# Patient Record
Sex: Female | Born: 1945 | Race: White | Hispanic: No | Marital: Married | Smoking: Former smoker
Health system: Southern US, Community
[De-identification: ages and names within clinical notes are randomized; demographics above are authoritative.]

## PROBLEM LIST (undated history)

## (undated) DIAGNOSIS — I2510 Atherosclerotic heart disease of native coronary artery without angina pectoris: Secondary | ICD-10-CM

## (undated) DIAGNOSIS — Z8701 Personal history of pneumonia (recurrent): Secondary | ICD-10-CM

## (undated) DIAGNOSIS — N393 Stress incontinence (female) (male): Secondary | ICD-10-CM

## (undated) DIAGNOSIS — C801 Malignant (primary) neoplasm, unspecified: Secondary | ICD-10-CM

## (undated) DIAGNOSIS — K08109 Complete loss of teeth, unspecified cause, unspecified class: Secondary | ICD-10-CM

## (undated) DIAGNOSIS — E785 Hyperlipidemia, unspecified: Secondary | ICD-10-CM

## (undated) DIAGNOSIS — Z8709 Personal history of other diseases of the respiratory system: Secondary | ICD-10-CM

## (undated) DIAGNOSIS — Z923 Personal history of irradiation: Secondary | ICD-10-CM

## (undated) DIAGNOSIS — E039 Hypothyroidism, unspecified: Secondary | ICD-10-CM

## (undated) DIAGNOSIS — F329 Major depressive disorder, single episode, unspecified: Secondary | ICD-10-CM

## (undated) DIAGNOSIS — Z972 Presence of dental prosthetic device (complete) (partial): Secondary | ICD-10-CM

## (undated) DIAGNOSIS — E119 Type 2 diabetes mellitus without complications: Secondary | ICD-10-CM

## (undated) DIAGNOSIS — J449 Chronic obstructive pulmonary disease, unspecified: Secondary | ICD-10-CM

## (undated) DIAGNOSIS — C50919 Malignant neoplasm of unspecified site of unspecified female breast: Secondary | ICD-10-CM

## (undated) DIAGNOSIS — I10 Essential (primary) hypertension: Secondary | ICD-10-CM

## (undated) DIAGNOSIS — I219 Acute myocardial infarction, unspecified: Secondary | ICD-10-CM

## (undated) DIAGNOSIS — C55 Malignant neoplasm of uterus, part unspecified: Secondary | ICD-10-CM

## (undated) DIAGNOSIS — M1990 Unspecified osteoarthritis, unspecified site: Secondary | ICD-10-CM

## (undated) DIAGNOSIS — F32A Depression, unspecified: Secondary | ICD-10-CM

## (undated) DIAGNOSIS — M810 Age-related osteoporosis without current pathological fracture: Secondary | ICD-10-CM

## (undated) HISTORY — PX: CARDIAC CATHETERIZATION: SHX172

## (undated) HISTORY — DX: Hypothyroidism, unspecified: E03.9

## (undated) HISTORY — PX: MULTIPLE TOOTH EXTRACTIONS: SHX2053

## (undated) HISTORY — DX: Essential (primary) hypertension: I10

## (undated) HISTORY — PX: TONSILLECTOMY: SUR1361

## (undated) HISTORY — PX: COLONOSCOPY W/ POLYPECTOMY: SHX1380

## (undated) HISTORY — PX: ABDOMINAL HYSTERECTOMY: SHX81

## (undated) HISTORY — PX: OTHER SURGICAL HISTORY: SHX169

## (undated) HISTORY — DX: Age-related osteoporosis without current pathological fracture: M81.0

## (undated) HISTORY — DX: Atherosclerotic heart disease of native coronary artery without angina pectoris: I25.10

## (undated) HISTORY — DX: Type 2 diabetes mellitus without complications: E11.9

## (undated) HISTORY — PX: BLADDER SURGERY: SHX569

## (undated) HISTORY — PX: CORONARY ARTERY BYPASS GRAFT: SHX141

## (undated) HISTORY — DX: Hyperlipidemia, unspecified: E78.5

## (undated) HISTORY — PX: APPENDECTOMY: SHX54

## (undated) HISTORY — PX: TMJ ARTHROPLASTY: SHX1066

---

## 1994-02-14 DIAGNOSIS — I219 Acute myocardial infarction, unspecified: Secondary | ICD-10-CM | POA: Insufficient documentation

## 1994-02-14 HISTORY — DX: Acute myocardial infarction, unspecified: I21.9

## 2010-05-19 ENCOUNTER — Ambulatory Visit: Payer: Self-pay | Admitting: Cardiovascular Disease

## 2010-08-20 ENCOUNTER — Encounter: Payer: Self-pay | Admitting: Cardiovascular Disease

## 2010-10-17 ENCOUNTER — Other Ambulatory Visit (HOSPITAL_COMMUNITY): Payer: Self-pay | Admitting: Internal Medicine

## 2010-10-17 DIAGNOSIS — Z1231 Encounter for screening mammogram for malignant neoplasm of breast: Secondary | ICD-10-CM

## 2010-10-28 ENCOUNTER — Ambulatory Visit (HOSPITAL_COMMUNITY)
Admission: RE | Admit: 2010-10-28 | Discharge: 2010-10-28 | Disposition: A | Discharge status: Home / Self Care | Payer: Medicare Other | Source: Ambulatory Visit | Attending: Internal Medicine | Admitting: Internal Medicine

## 2010-10-28 DIAGNOSIS — Z1231 Encounter for screening mammogram for malignant neoplasm of breast: Secondary | ICD-10-CM

## 2010-10-28 DIAGNOSIS — Z13820 Encounter for screening for osteoporosis: Secondary | ICD-10-CM | POA: Insufficient documentation

## 2010-10-28 DIAGNOSIS — Z780 Asymptomatic menopausal state: Secondary | ICD-10-CM | POA: Insufficient documentation

## 2010-11-04 ENCOUNTER — Other Ambulatory Visit: Payer: Self-pay | Admitting: Internal Medicine

## 2010-11-04 DIAGNOSIS — R928 Other abnormal and inconclusive findings on diagnostic imaging of breast: Secondary | ICD-10-CM

## 2010-11-12 ENCOUNTER — Ambulatory Visit
Admission: RE | Admit: 2010-11-12 | Discharge: 2010-11-12 | Disposition: A | Discharge status: Home / Self Care | Payer: Medicare Other | Source: Ambulatory Visit | Attending: Internal Medicine | Admitting: Internal Medicine

## 2010-11-12 DIAGNOSIS — R928 Other abnormal and inconclusive findings on diagnostic imaging of breast: Secondary | ICD-10-CM

## 2010-12-04 ENCOUNTER — Encounter: Payer: Self-pay | Admitting: Cardiovascular Disease

## 2010-12-04 DIAGNOSIS — I2510 Atherosclerotic heart disease of native coronary artery without angina pectoris: Secondary | ICD-10-CM | POA: Insufficient documentation

## 2010-12-04 DIAGNOSIS — E785 Hyperlipidemia, unspecified: Secondary | ICD-10-CM | POA: Insufficient documentation

## 2010-12-04 DIAGNOSIS — I10 Essential (primary) hypertension: Secondary | ICD-10-CM | POA: Insufficient documentation

## 2010-12-08 ENCOUNTER — Encounter: Payer: Self-pay | Admitting: Cardiovascular Disease

## 2010-12-08 ENCOUNTER — Ambulatory Visit (INDEPENDENT_AMBULATORY_CARE_PROVIDER_SITE_OTHER): Payer: Medicare Other | Admitting: Cardiovascular Disease

## 2010-12-08 VITALS — BP 136/78 | HR 64 | Ht 62.0 in | Wt 169.4 lb

## 2010-12-08 DIAGNOSIS — I2510 Atherosclerotic heart disease of native coronary artery without angina pectoris: Secondary | ICD-10-CM

## 2010-12-08 DIAGNOSIS — E785 Hyperlipidemia, unspecified: Secondary | ICD-10-CM

## 2010-12-08 NOTE — Assessment & Plan Note (Signed)
Her lipid levels from Dr. Su Hilt office very Acceptable. She is to continue with her same medications.

## 2010-12-08 NOTE — Assessment & Plan Note (Signed)
Denise Mckenzie is doing well from a cardiac standpoint. She's not had any episodes of chest pain. I'm pleased that she's been able to do all of her normal activities. We will continue with her same medications.

## 2010-12-08 NOTE — Progress Notes (Signed)
Denise Mckenzie Old Date of Birth  11-27-1945 [facility] / [facility] [address]     [address] [place], [place]  [address] [phone]  Fax  [phone]  History of Present Illness:  Denise Mckenzie is a middle-age female the history of coronary artery disease and coronary artery bypass grafting. She also has a history of hypertension, hyperlipidemia, and hypothyroidism. She has not had any episodes of chest pain or shortness of breath.  Current Outpatient Prescriptions on File Prior to Visit  Medication Sig Dispense Refill  . amitriptyline (ELAVIL) 25 MG tablet Take 25 mg by mouth at bedtime.        Marland Kitchen aspirin 325 MG tablet Take 325 mg by mouth daily.        Marland Kitchen atorvastatin (LIPITOR) 40 MG tablet Take 40 mg by mouth daily.        . calcitonin, salmon, (MIACALCIN/FORTICAL) 200 UNIT/ACT nasal spray 1 spray by Nasal route daily.        Marland Kitchen CALCIUM PO Take 1,800 mg by mouth daily.        Marland Kitchen HYDROcodone-acetaminophen (VICODIN) 5-500 MG per tablet Take 1 tablet by mouth every 6 (six) hours as needed.        Marland Kitchen levothyroxine (SYNTHROID, LEVOTHROID) 88 MCG tablet Take 88 mcg by mouth daily.        . metoprolol (LOPRESSOR) 50 MG tablet Take 25 mg by mouth 2 (two) times daily.        . nitroGLYCERIN (NITROSTAT) 0.4 MG SL tablet Place 0.4 mg under the tongue every 5 (five) minutes as needed.        . valsartan-hydrochlorothiazide (DIOVAN-HCT) 80-12.5 MG per tablet Take 1 tablet by mouth daily.          No Known Allergies  Past Medical History  Diagnosis Date  . Coronary artery disease   . Hyperlipidemia   . Hypertension     Past Surgical History  Procedure Date  . Coronary artery bypass graft     History  Smoking status  . Former Smoker  . Quit date: 08/17/1994  Smokeless tobacco  . Not on file    History  Alcohol Use No    Family History  Problem Relation Age of Onset  . Alzheimer's disease Mother   . Heart attack Father     Reviw of Systems:  Reviewed in  the HPI.  All other systems are negative.  Physical Exam: BP 136/78  Pulse 64  Ht 5\' 2"  (1.575 m)  Wt 169 lb 6.4 oz (76.839 kg)  BMI 30.98 kg/m2 The patient is alert and oriented x 3.  The mood and affect are normal.  The skin is warm and dry.  Color is normal.  The HEENT exam reveals that the sclera are nonicteric.  The mucous membranes are moist.  The carotids are 2+ without bruits.  There is no thyromegaly.  There is no JVD.  The lungs are clear.  The chest wall is non tender.  The heart exam reveals a regular rate with a normal S1 and S2.  There are no murmurs, gallops, or rubs.  The PMI is not displaced.   Abdominal exam reveals good bowel sounds.  There is no guarding or rebound.  There is no hepatosplenomegaly or tenderness.  There are no masses.  Exam of the legs reveal no clubbing, cyanosis, or edema.  The legs are without rashes.  The distal pulses are intact.  Cranial nerves II - XII are intact.  Motor and sensory functions are intact.  The gait is normal.  Laboratory data from Dr. Su Hilt office reveals a hemoglobin of 16.0. Her sodium is 139. Potassium is 4.5. BUN is 8. Creatinine 0.82. Her liver function tests are normal. Her TSH was 1.123. Her total cholesterol is 149. The triglyceride level is 155. The HDL is 38. Her LDL is 80. Her hemoglobin A1c is 6.3. Assessment / Plan:

## 2011-06-16 ENCOUNTER — Encounter: Payer: Self-pay | Admitting: Cardiovascular Disease

## 2011-06-16 ENCOUNTER — Ambulatory Visit (INDEPENDENT_AMBULATORY_CARE_PROVIDER_SITE_OTHER): Payer: Medicare Other | Admitting: Cardiovascular Disease

## 2011-06-16 VITALS — BP 134/79 | HR 70 | Ht 62.0 in | Wt 174.0 lb

## 2011-06-16 DIAGNOSIS — I10 Essential (primary) hypertension: Secondary | ICD-10-CM

## 2011-06-16 DIAGNOSIS — I2510 Atherosclerotic heart disease of native coronary artery without angina pectoris: Secondary | ICD-10-CM

## 2011-06-16 DIAGNOSIS — E785 Hyperlipidemia, unspecified: Secondary | ICD-10-CM

## 2011-06-16 NOTE — Progress Notes (Signed)
Denise Mckenzie Date of Birth  1945/11/27 [facility] [address]    [address] [place], [place]  [address] [phone]  Fax  [phone]  History of Present Illness:  65 YO with a Hx of CAD- S/P CABG.  Hx of hypothyroidism, HTN and hyperlipidemia.  She has been under lots of stress with her husbands illness. She denies any chest pain or dyspnea.    Current Outpatient Prescriptions on File Prior to Visit  Medication Sig Dispense Refill  . aspirin 325 MG tablet Take 325 mg by mouth daily.        Marland Kitchen atorvastatin (LIPITOR) 40 MG tablet Take 40 mg by mouth daily.        . calcitonin, salmon, (MIACALCIN/FORTICAL) 200 UNIT/ACT nasal spray 1 spray by Nasal route daily.        Marland Kitchen CALCIUM PO Take 1,800 mg by mouth daily.        . fish oil-omega-3 fatty acids 1000 MG capsule Take by mouth daily.        Marland Kitchen HYDROcodone-acetaminophen (VICODIN) 5-500 MG per tablet Take 1 tablet by mouth every 6 (six) hours as needed.        Marland Kitchen levothyroxine (SYNTHROID, LEVOTHROID) 88 MCG tablet Take 88 mcg by mouth daily.        Marland Kitchen losartan-hydrochlorothiazide (HYZAAR) 100-12.5 MG per tablet Take 1 tablet by mouth daily.        . metoprolol (LOPRESSOR) 50 MG tablet Take 25 mg by mouth 2 (two) times daily.        . nitroGLYCERIN (NITROSTAT) 0.4 MG SL tablet Place 0.4 mg under the tongue every 5 (five) minutes as needed.          No Known Allergies  Past Medical History  Diagnosis Date  . Coronary artery disease   . Hyperlipidemia   . Hypertension     Past Surgical History  Procedure Date  . Coronary artery bypass graft     History  Smoking status  . Former Smoker  . Quit date: 08/17/1994  Smokeless tobacco  . Not on file    History  Alcohol Use No    Family History  Problem Relation Age of Onset  . Alzheimer's disease Mother   . Heart attack Father     Reviw of Systems:  Reviewed in the HPI.  All other systems are negative.  Physical Exam: BP 134/79  Pulse 70  Ht 5\' 2"  (1.575 m)   Wt 174 lb (78.926 kg)  BMI 31.83 kg/m2 The patient is alert and oriented x 3.  The mood and affect are normal.   Skin: warm and dry.  Color is normal.    HEENT:   2+ right carotid, 1+ left carotid  Lungs: clear   Heart: RR, normal heart sounds    Abdomen: soft, good BS  Extremities:  No edema  Neuro:  Non focal     ECG: NSR, Old septal MI.   Assessment / Plan:

## 2011-06-16 NOTE — Assessment & Plan Note (Signed)
Denise Mckenzie seems to be doing fairly well. She's not having episodes of chest pain. We'll continue with her same medications.

## 2011-06-16 NOTE — Assessment & Plan Note (Signed)
Her blood pressure remains well controlled.  

## 2011-06-16 NOTE — Assessment & Plan Note (Signed)
She has her lab work checked at Dr. Su Hilt office. We'll have her bring a copy of her most recent labs.

## 2011-06-16 NOTE — Patient Instructions (Signed)
Your physician wants you to follow-up in: 6 months  You will receive a reminder letter in the mail two months in advance. If you don't receive a letter, please call our office to schedule the follow-up appointment.  Your physician recommends that you continue on your current medications as directed. Please refer to the Current Medication list given to you today.  

## 2011-08-21 ENCOUNTER — Encounter: Payer: Self-pay | Admitting: Cardiovascular Disease

## 2011-11-17 ENCOUNTER — Other Ambulatory Visit (HOSPITAL_COMMUNITY): Payer: Self-pay | Admitting: Internal Medicine

## 2011-11-17 DIAGNOSIS — Z1231 Encounter for screening mammogram for malignant neoplasm of breast: Secondary | ICD-10-CM

## 2011-12-11 ENCOUNTER — Ambulatory Visit (HOSPITAL_COMMUNITY)
Admission: RE | Admit: 2011-12-11 | Discharge: 2011-12-11 | Disposition: A | Discharge status: Home / Self Care | Payer: Medicare Other | Source: Ambulatory Visit | Attending: Internal Medicine | Admitting: Internal Medicine

## 2011-12-11 DIAGNOSIS — Z1231 Encounter for screening mammogram for malignant neoplasm of breast: Secondary | ICD-10-CM | POA: Insufficient documentation

## 2012-08-04 ENCOUNTER — Other Ambulatory Visit: Payer: Self-pay | Admitting: *Deleted

## 2012-12-01 ENCOUNTER — Other Ambulatory Visit (HOSPITAL_COMMUNITY): Payer: Self-pay | Admitting: Internal Medicine

## 2012-12-01 DIAGNOSIS — Z1231 Encounter for screening mammogram for malignant neoplasm of breast: Secondary | ICD-10-CM

## 2012-12-12 ENCOUNTER — Ambulatory Visit (HOSPITAL_COMMUNITY): Payer: Medicare Other

## 2012-12-15 ENCOUNTER — Ambulatory Visit (HOSPITAL_COMMUNITY)
Admission: RE | Admit: 2012-12-15 | Discharge: 2012-12-15 | Disposition: A | Discharge status: Home / Self Care | Payer: Medicare Other | Source: Ambulatory Visit | Attending: Internal Medicine | Admitting: Internal Medicine

## 2012-12-15 DIAGNOSIS — Z1231 Encounter for screening mammogram for malignant neoplasm of breast: Secondary | ICD-10-CM

## 2012-12-19 ENCOUNTER — Other Ambulatory Visit: Payer: Self-pay | Admitting: Internal Medicine

## 2012-12-19 DIAGNOSIS — R928 Other abnormal and inconclusive findings on diagnostic imaging of breast: Secondary | ICD-10-CM

## 2012-12-29 ENCOUNTER — Ambulatory Visit
Admission: RE | Admit: 2012-12-29 | Discharge: 2012-12-29 | Disposition: A | Discharge status: Home / Self Care | Payer: Medicare Other | Source: Ambulatory Visit | Attending: Internal Medicine | Admitting: Internal Medicine

## 2012-12-29 DIAGNOSIS — R928 Other abnormal and inconclusive findings on diagnostic imaging of breast: Secondary | ICD-10-CM

## 2013-06-13 ENCOUNTER — Other Ambulatory Visit: Payer: Self-pay | Admitting: Internal Medicine

## 2013-06-13 DIAGNOSIS — N6320 Unspecified lump in the left breast, unspecified quadrant: Secondary | ICD-10-CM

## 2013-07-03 ENCOUNTER — Ambulatory Visit
Admission: RE | Admit: 2013-07-03 | Discharge: 2013-07-03 | Disposition: A | Discharge status: Home / Self Care | Payer: Medicare Other | Source: Ambulatory Visit | Attending: Internal Medicine | Admitting: Internal Medicine

## 2013-07-03 DIAGNOSIS — N6320 Unspecified lump in the left breast, unspecified quadrant: Secondary | ICD-10-CM

## 2013-11-13 ENCOUNTER — Encounter: Payer: Self-pay | Admitting: Cardiovascular Disease

## 2013-12-12 ENCOUNTER — Other Ambulatory Visit: Payer: Self-pay | Admitting: Internal Medicine

## 2013-12-12 DIAGNOSIS — N649 Disorder of breast, unspecified: Secondary | ICD-10-CM

## 2014-01-01 ENCOUNTER — Ambulatory Visit
Admission: RE | Admit: 2014-01-01 | Discharge: 2014-01-01 | Disposition: A | Discharge status: Home / Self Care | Payer: Medicare Other | Source: Ambulatory Visit | Attending: Internal Medicine | Admitting: Internal Medicine

## 2014-01-01 ENCOUNTER — Encounter (INDEPENDENT_AMBULATORY_CARE_PROVIDER_SITE_OTHER): Payer: Self-pay

## 2014-01-01 DIAGNOSIS — N649 Disorder of breast, unspecified: Secondary | ICD-10-CM

## 2014-08-22 ENCOUNTER — Other Ambulatory Visit (HOSPITAL_COMMUNITY): Payer: Self-pay | Admitting: Internal Medicine

## 2014-08-22 DIAGNOSIS — Z0000 Encounter for general adult medical examination without abnormal findings: Secondary | ICD-10-CM | POA: Diagnosis not present

## 2014-08-22 DIAGNOSIS — R002 Palpitations: Secondary | ICD-10-CM | POA: Diagnosis not present

## 2014-08-22 DIAGNOSIS — Z6833 Body mass index (BMI) 33.0-33.9, adult: Secondary | ICD-10-CM | POA: Diagnosis not present

## 2014-08-22 DIAGNOSIS — R1032 Left lower quadrant pain: Secondary | ICD-10-CM | POA: Diagnosis not present

## 2014-08-22 DIAGNOSIS — R42 Dizziness and giddiness: Secondary | ICD-10-CM | POA: Diagnosis not present

## 2014-08-22 DIAGNOSIS — Z1389 Encounter for screening for other disorder: Secondary | ICD-10-CM | POA: Diagnosis not present

## 2014-08-22 DIAGNOSIS — Z131 Encounter for screening for diabetes mellitus: Secondary | ICD-10-CM | POA: Diagnosis not present

## 2014-08-22 DIAGNOSIS — E039 Hypothyroidism, unspecified: Secondary | ICD-10-CM | POA: Diagnosis not present

## 2014-08-22 DIAGNOSIS — I70219 Atherosclerosis of native arteries of extremities with intermittent claudication, unspecified extremity: Secondary | ICD-10-CM | POA: Diagnosis not present

## 2014-08-22 DIAGNOSIS — R5383 Other fatigue: Secondary | ICD-10-CM | POA: Diagnosis not present

## 2014-08-22 DIAGNOSIS — E780 Pure hypercholesterolemia: Secondary | ICD-10-CM | POA: Diagnosis not present

## 2014-08-22 DIAGNOSIS — Z1211 Encounter for screening for malignant neoplasm of colon: Secondary | ICD-10-CM | POA: Diagnosis not present

## 2014-08-22 DIAGNOSIS — M810 Age-related osteoporosis without current pathological fracture: Secondary | ICD-10-CM

## 2014-08-22 DIAGNOSIS — H8309 Labyrinthitis, unspecified ear: Secondary | ICD-10-CM | POA: Diagnosis not present

## 2014-08-22 DIAGNOSIS — Z79899 Other long term (current) drug therapy: Secondary | ICD-10-CM | POA: Diagnosis not present

## 2014-08-22 DIAGNOSIS — H9113 Presbycusis, bilateral: Secondary | ICD-10-CM | POA: Diagnosis not present

## 2014-08-22 DIAGNOSIS — J440 Chronic obstructive pulmonary disease with acute lower respiratory infection: Secondary | ICD-10-CM | POA: Diagnosis not present

## 2014-08-22 DIAGNOSIS — Z23 Encounter for immunization: Secondary | ICD-10-CM | POA: Diagnosis not present

## 2014-08-30 ENCOUNTER — Ambulatory Visit (HOSPITAL_COMMUNITY)
Admission: RE | Admit: 2014-08-30 | Discharge: 2014-08-30 | Disposition: A | Discharge status: Home / Self Care | Payer: Medicare Other | Source: Ambulatory Visit | Attending: Internal Medicine | Admitting: Internal Medicine

## 2014-08-30 DIAGNOSIS — Z13820 Encounter for screening for osteoporosis: Secondary | ICD-10-CM | POA: Diagnosis not present

## 2014-08-30 DIAGNOSIS — M810 Age-related osteoporosis without current pathological fracture: Secondary | ICD-10-CM | POA: Diagnosis not present

## 2014-08-30 DIAGNOSIS — Z780 Asymptomatic menopausal state: Secondary | ICD-10-CM | POA: Insufficient documentation

## 2014-12-25 ENCOUNTER — Other Ambulatory Visit (HOSPITAL_COMMUNITY): Payer: Self-pay | Admitting: Internal Medicine

## 2014-12-25 DIAGNOSIS — Z1231 Encounter for screening mammogram for malignant neoplasm of breast: Secondary | ICD-10-CM

## 2015-01-01 ENCOUNTER — Ambulatory Visit (HOSPITAL_COMMUNITY)
Admission: RE | Admit: 2015-01-01 | Discharge: 2015-01-01 | Disposition: A | Discharge status: Home / Self Care | Payer: Medicare Other | Source: Ambulatory Visit | Attending: Internal Medicine | Admitting: Internal Medicine

## 2015-01-01 ENCOUNTER — Other Ambulatory Visit (HOSPITAL_COMMUNITY): Payer: Self-pay | Admitting: Internal Medicine

## 2015-01-01 DIAGNOSIS — Z1231 Encounter for screening mammogram for malignant neoplasm of breast: Secondary | ICD-10-CM

## 2015-01-07 ENCOUNTER — Ambulatory Visit (HOSPITAL_COMMUNITY)
Admission: RE | Admit: 2015-01-07 | Discharge: 2015-01-07 | Disposition: A | Discharge status: Home / Self Care | Payer: Medicare Other | Source: Ambulatory Visit | Attending: Internal Medicine | Admitting: Internal Medicine

## 2015-01-07 DIAGNOSIS — Z1231 Encounter for screening mammogram for malignant neoplasm of breast: Secondary | ICD-10-CM | POA: Diagnosis present

## 2015-09-02 ENCOUNTER — Ambulatory Visit
Admission: RE | Admit: 2015-09-02 | Discharge: 2015-09-02 | Disposition: A | Discharge status: Home / Self Care | Payer: Medicare Other | Source: Ambulatory Visit | Attending: Internal Medicine | Admitting: Internal Medicine

## 2015-09-02 ENCOUNTER — Other Ambulatory Visit: Payer: Self-pay | Admitting: Internal Medicine

## 2015-09-02 DIAGNOSIS — R0602 Shortness of breath: Secondary | ICD-10-CM

## 2015-09-04 ENCOUNTER — Other Ambulatory Visit: Payer: Self-pay | Admitting: Internal Medicine

## 2015-09-04 DIAGNOSIS — R9389 Abnormal findings on diagnostic imaging of other specified body structures: Secondary | ICD-10-CM

## 2015-09-11 ENCOUNTER — Ambulatory Visit
Admission: RE | Admit: 2015-09-11 | Discharge: 2015-09-11 | Disposition: A | Discharge status: Home / Self Care | Payer: Medicare Other | Source: Ambulatory Visit | Attending: Internal Medicine | Admitting: Internal Medicine

## 2015-09-11 DIAGNOSIS — R9389 Abnormal findings on diagnostic imaging of other specified body structures: Secondary | ICD-10-CM

## 2015-09-18 ENCOUNTER — Telehealth: Payer: Self-pay | Admitting: *Deleted

## 2015-09-18 NOTE — Telephone Encounter (Signed)
Oncology Nurse Navigator Documentation  Oncology Nurse Navigator Flowsheets 09/18/2015  Navigator Encounter Type Telephone  Telephone Outgoing Call/I received a referral on [name] today.  I called her and left a vm message to call with appt.  I left my name and phone number to call.   Abnormal Finding Date 09/11/2015  Treatment Phase Abnormal Scans  Barriers/Navigation Needs Coordination of Care  Interventions Coordination of Care  Acuity Level 1  Time Spent with Patient 15

## 2015-09-19 ENCOUNTER — Telehealth: Payer: Self-pay | Admitting: *Deleted

## 2015-09-19 DIAGNOSIS — R911 Solitary pulmonary nodule: Secondary | ICD-10-CM | POA: Insufficient documentation

## 2015-09-19 NOTE — Telephone Encounter (Signed)
Oncology Nurse Navigator Documentation  Oncology Nurse Navigator Flowsheets 09/19/2015  Navigator Encounter Type Telephone  Telephone Outgoing Call/Ms. Dost called and left me a message to call.  I called her back and gave her an appt to see Dr. Julien Nordmann on 10/01/15 arrive at 1:45.  Patient verbalized understanding of appt time and place. She stated she was nervous about the findings.  I listened as she explained.  I stated she will need to have more test to figure out what is going on.  She was thankful for the call.   Treatment Phase Abnormal Scans  Barriers/Navigation Needs Coordination of Care  Interventions Coordination of Care  Coordination of Care Appts  Acuity Level 2

## 2015-09-19 NOTE — Telephone Encounter (Signed)
Return call.  "I have questions for Navigator that just called."  Call transferred to [phone].

## 2015-10-01 ENCOUNTER — Encounter: Payer: Self-pay | Admitting: Internal Medicine

## 2015-10-01 ENCOUNTER — Other Ambulatory Visit (HOSPITAL_BASED_OUTPATIENT_CLINIC_OR_DEPARTMENT_OTHER): Payer: Medicare Other

## 2015-10-01 ENCOUNTER — Telehealth: Payer: Self-pay | Admitting: Internal Medicine

## 2015-10-01 ENCOUNTER — Ambulatory Visit (HOSPITAL_BASED_OUTPATIENT_CLINIC_OR_DEPARTMENT_OTHER): Payer: Medicare Other | Admitting: Internal Medicine

## 2015-10-01 VITALS — BP 137/68 | HR 87 | Temp 98.3°F | Resp 19 | Ht 62.0 in | Wt 171.5 lb

## 2015-10-01 DIAGNOSIS — Z87891 Personal history of nicotine dependence: Secondary | ICD-10-CM

## 2015-10-01 DIAGNOSIS — R911 Solitary pulmonary nodule: Secondary | ICD-10-CM | POA: Diagnosis not present

## 2015-10-01 LAB — CBC WITH DIFFERENTIAL/PLATELET
BASO%: 1.2 % (ref 0.0–2.0)
Basophils Absolute: 0.1 10*3/uL (ref 0.0–0.1)
EOS%: 2.2 % (ref 0.0–7.0)
Eosinophils Absolute: 0.2 10*3/uL (ref 0.0–0.5)
HCT: 45.5 % (ref 34.8–46.6)
HGB: 15.2 g/dL (ref 11.6–15.9)
LYMPH#: 1.9 10*3/uL (ref 0.9–3.3)
LYMPH%: 18.7 % (ref 14.0–49.7)
MCH: 30 pg (ref 25.1–34.0)
MCHC: 33.4 g/dL (ref 31.5–36.0)
MCV: 89.8 fL (ref 79.5–101.0)
MONO#: 0.7 10*3/uL (ref 0.1–0.9)
MONO%: 7.1 % (ref 0.0–14.0)
NEUT%: 70.8 % (ref 38.4–76.8)
NEUTROS ABS: 7.2 10*3/uL — AB (ref 1.5–6.5)
Platelets: 212 10*3/uL (ref 145–400)
RBC: 5.06 10*6/uL (ref 3.70–5.45)
RDW: 14 % (ref 11.2–14.5)
WBC: 10.2 10*3/uL (ref 3.9–10.3)

## 2015-10-01 LAB — COMPREHENSIVE METABOLIC PANEL
ALT: 18 U/L (ref 0–55)
AST: 14 U/L (ref 5–34)
Albumin: 3.7 g/dL (ref 3.5–5.0)
Alkaline Phosphatase: 106 U/L (ref 40–150)
Anion Gap: 11 mEq/L (ref 3–11)
BILIRUBIN TOTAL: 0.44 mg/dL (ref 0.20–1.20)
BUN: 11.5 mg/dL (ref 7.0–26.0)
CHLORIDE: 95 meq/L — AB (ref 98–109)
CO2: 30 mEq/L — ABNORMAL HIGH (ref 22–29)
CREATININE: 0.8 mg/dL (ref 0.6–1.1)
Calcium: 9.6 mg/dL (ref 8.4–10.4)
EGFR: 71 mL/min/{1.73_m2} — ABNORMAL LOW (ref >90)
GLUCOSE: 115 mg/dL (ref 70–140)
Potassium: 3.6 mEq/L (ref 3.5–5.1)
Sodium: 135 mEq/L — ABNORMAL LOW (ref 136–145)
TOTAL PROTEIN: 7.4 g/dL (ref 6.4–8.3)

## 2015-10-01 NOTE — Progress Notes (Signed)
[place] [phone]   [phone]  CONSULT NOTE  REFERRING PHYSICIAN: Dr. Lorene Dy  REASON FOR CONSULTATION:  70 years old white female with suspicious right lung nodule.  HPI Denise Mckenzie is a 70 y.o. female with past medical history significant for coronary artery disease status post CABG by Dr. Servando Snare in 1996, dyslipidemia, hypertension, osteoporosis and recently diagnosed diabetes mellitus. The patient also has history of smoking but quit in 1996. She was seen by her primary care physician Dr. Mancel Bale in January 2017 for routine annual exam and she was complaining of mild cough and shortness breath. Chest x-ray was performed on 09/02/2015 and it showed 1.3 cm rounded nodular density projecting over the right lung apex. This was followed by CT scan of the chest without contrast on 09/11/2015 and it showed a 1.1 cm nodule in the right upper lobe corresponding to the density seen on the chest x-ray concerning for malignancy. There were other small scattered nodules within the lungs that are too small to characterize. There is also some lingular scarring. Dr. Mancel Bale kindly referred the patient to me today for evaluation and recommendation regarding these abnormality in her lung. When seen today the patient is feeling fine with no specific complaints. She denied having any significant weight loss or night sweats. She has no chest pain, shortness breath, cough or hemoptysis. She denied having any nausea, vomiting, diarrhea or constipation. No headache or visual changes. Family history significant for mother died from [place] and father had heart disease. The patient is married and has a daughter and son. She used to work as Glass blower/designer and currently retired. She has a history of smoking 0.5 pack per day for 21 years and quit in 1996. She has no history of alcohol or drug abuse.  HPI  Past Medical History  Diagnosis Date  . Coronary artery  disease   . Hyperlipidemia   . Hypertension     Past Surgical History  Procedure Laterality Date  . Coronary artery bypass graft      Family History  Problem Relation Age of Onset  . Alzheimer's disease Mother   . Heart attack Father     Social History Social History  Substance Use Topics  . Smoking status: Former Smoker    Quit date: 08/17/1994  . Smokeless tobacco: Not on file  . Alcohol Use: No    No Known Allergies  Current Outpatient Prescriptions  Medication Sig Dispense Refill  . amitriptyline (ELAVIL) 50 MG tablet Take 50 mg by mouth at bedtime.      Marland Kitchen aspirin 325 MG tablet Take 325 mg by mouth daily.      Marland Kitchen atorvastatin (LIPITOR) 40 MG tablet Take 40 mg by mouth daily.      Marland Kitchen CALCIUM PO Take 1,800 mg by mouth daily.      . fish oil-omega-3 fatty acids 1000 MG capsule Take by mouth daily.      Marland Kitchen ibandronate (BONIVA) 150 MG tablet Take 1 tablet by mouth every 30 (thirty) days.  4  . levothyroxine (SYNTHROID, LEVOTHROID) 88 MCG tablet Take 88 mcg by mouth daily.      Marland Kitchen losartan-hydrochlorothiazide (HYZAAR) 100-12.5 MG per tablet Take 1 tablet by mouth daily.      . metFORMIN (GLUCOPHAGE) 500 MG tablet Take 500 mg by mouth daily.  4  . metoprolol (LOPRESSOR) 50 MG tablet Take 25 mg by mouth 2 (two) times daily.      . nitroGLYCERIN (  NITROSTAT) 0.4 MG SL tablet Place 0.4 mg under the tongue every 5 (five) minutes as needed.       No current facility-administered medications for this visit.    Review of Systems  Constitutional: negative Eyes: negative Ears, nose, mouth, throat, and face: negative Respiratory: negative Cardiovascular: negative Gastrointestinal: negative Genitourinary:negative Integument/breast: negative Hematologic/lymphatic: negative Musculoskeletal:negative Neurological: negative Behavioral/Psych: negative Endocrine: negative Allergic/Immunologic: negative  Physical Exam  VVO:HYWVP, healthy, no distress, well nourished and well  developed SKIN: skin color, texture, turgor are normal, no rashes or significant lesions HEAD: Normocephalic, No masses, lesions, tenderness or abnormalities EYES: normal, PERRLA, Conjunctiva are pink and non-injected EARS: External ears normal, Canals clear OROPHARYNX:no exudate, no erythema and lips, buccal mucosa, and tongue normal  NECK: supple, no adenopathy, no JVD LYMPH:  no palpable lymphadenopathy, no hepatosplenomegaly BREAST:not examined LUNGS: clear to auscultation , and palpation HEART: regular rate & rhythm and no murmurs ABDOMEN:abdomen soft, non-tender, normal bowel sounds and no masses or organomegaly BACK: Back symmetric, no curvature., No CVA tenderness EXTREMITIES:no joint deformities, effusion, or inflammation, no edema, no skin discoloration  NEURO: alert & oriented x 3 with fluent speech, no focal motor/sensory deficits  PERFORMANCE STATUS: ECOG 1  LABORATORY DATA: Lab Results  Component Value Date   WBC 10.2 10/01/2015   HGB 15.2 10/01/2015   HCT 45.5 10/01/2015   MCV 89.8 10/01/2015   PLT 212 10/01/2015      Chemistry      Component Value Date/Time   NA 135* 10/01/2015 1335   K 3.6 10/01/2015 1335   CO2 30* 10/01/2015 1335   BUN 11.5 10/01/2015 1335   CREATININE 0.8 10/01/2015 1335      Component Value Date/Time   CALCIUM 9.6 10/01/2015 1335   ALKPHOS 106 10/01/2015 1335   AST 14 10/01/2015 1335   ALT 18 10/01/2015 1335   BILITOT 0.44 10/01/2015 1335       RADIOGRAPHIC STUDIES: Dg Chest 2 View  09/02/2015  CLINICAL DATA:  Cough. Shortness of breath. Prior heart surgery 1996. EXAM: CHEST  2 VIEW COMPARISON:  None FINDINGS: Vague density in the lingula on the frontal projection. Possible rounded density projecting over the right first rib and partially over the clavicle measuring 1.4 cm. Prior CABG.  Atherosclerotic aortic arch. No pleural effusion.  Thoracic kyphosis noted. IMPRESSION: 1. 1.3 cm rounded nodular density projecting over the right  lung apex. There is an indistinct density projecting over the lingula. Chest CT is recommended to exclude malignancy. 2. Atherosclerotic aortic arch. 3. Prior CABG. These results will be called to the ordering clinician or representative by the Radiologist Assistant, and communication documented in the PACS or zVision Dashboard. Electronically Signed   By: Van Clines M.D.   On: 09/02/2015 14:19   Ct Chest Wo Contrast  09/11/2015  CLINICAL DATA:  Right upper lobe density on chest x-ray. Remote history of smoking. EXAM: CT CHEST WITHOUT CONTRAST TECHNIQUE: Multidetector CT imaging of the chest was performed following the standard protocol without IV contrast. COMPARISON:  Chest x-ray 09/02/2015 FINDINGS: 11 mm nodule in the right upper lobe corresponding to the density seen on chest x-ray. This is on image 14. Scarring noted in the lingula corresponds to the lingular density on chest x-ray. 5 mm nodule in the right upper lobe on image 21. Other small scattered nodules in the right upper lobe, right lower lobe and left upper lobe. These are too small to characterize. Mild emphysematous changes in the lungs. Heart is normal size. Prior  CABG. Aorta is normal caliber with scattered calcifications. No mediastinal, hilar, or axillary adenopathy. Chest wall soft tissues are unremarkable. Imaging into the upper abdomen shows no acute findings. No acute bony abnormality or focal bone lesion. IMPRESSION: 11 mm nodule in the right upper lobe corresponding to the density seen on chest x-ray. Cannot exclude malignancy. Consider further evaluation with PET CT. Other small scattered nodules within the lungs, stick too small to characterize. Recommend attention on followup imaging. Lingular scarring. Electronically Signed   By: Rolm Baptise M.D.   On: 09/11/2015 10:42    ASSESSMENT: This is a very pleasant 70 years old white female who was recently found to have a suspicious nodule in the right upper lobe concerning for  malignancy.   PLAN: I had a lengthy discussion with the patient today about her current condition. I showed her the images of the CT scan of the chest and the location of the pulmonary nodule. I recommended for the patient to have a PET scan performed for further evaluation of these nodules. If the nodule is hypermetabolic on the PET scan, I referred the patient to Dr. Servando Snare for consideration of surgical resection if she has good pulmonary function. If she is not a good surgical candidate, we will consider biopsy of the left upper lobe pulmonary nodule followed by stereotactic radiotherapy. The patient agreed to the current plan. She would come back for follow-up visit at the multidisciplinary thoracic oncology clinic after her PET scan. The patient was advised to call immediately if she has any concerning symptoms in the interval. The patient voices understanding of current disease status and treatment options and is in agreement with the current care plan.  All questions were answered. The patient knows to call the clinic with any problems, questions or concerns. We can certainly see the patient much sooner if necessary.  Thank you so much for allowing me to participate in the care of Denise Mckenzie. I will continue to follow up the patient with you and assist in her care.  I spent 40 minutes counseling the patient face to face. The total time spent in the appointment was 60 minutes.  Disclaimer: This note was dictated with voice recognition software. Similar sounding words can inadvertently be transcribed and may not be corrected upon review.   Vihana Kydd K. October 01, 2015, 2:51 PM

## 2015-10-01 NOTE — Telephone Encounter (Signed)
per pof to H[place] Dyer to sch [facility] and willc all pt

## 2015-10-07 ENCOUNTER — Telehealth: Payer: Self-pay | Admitting: *Deleted

## 2015-10-07 NOTE — Telephone Encounter (Signed)
Oncology Nurse Navigator Documentation  Oncology Nurse Navigator Flowsheets 10/07/2015  Navigator Encounter Type Telephone/patient called and was confused about when to schedule PET.  I clarified.   Telephone Incoming Call  Abnormal Finding Date -  Treatment Phase Abnormal Scans  Barriers/Navigation Needs Coordination of Care  Interventions Coordination of Care  Coordination of Care Radiology  Acuity Level 1  Time Spent with Patient 15

## 2015-10-07 NOTE — Telephone Encounter (Signed)
"  I need to talk with Denise Mckenzie about [name] availability for my scans.  Schedulers have called twice and have no clue about [name] needed availability for my scans being scheduled."  Call transferred to navigator est. [phone].

## 2015-10-11 ENCOUNTER — Encounter: Payer: Self-pay | Admitting: *Deleted

## 2015-10-11 ENCOUNTER — Telehealth: Payer: Self-pay | Admitting: *Deleted

## 2015-10-11 ENCOUNTER — Ambulatory Visit (HOSPITAL_COMMUNITY)
Admission: RE | Admit: 2015-10-11 | Discharge: 2015-10-11 | Disposition: A | Discharge status: Home / Self Care | Payer: Medicare Other | Source: Ambulatory Visit | Attending: Internal Medicine | Admitting: Internal Medicine

## 2015-10-11 DIAGNOSIS — I77811 Abdominal aortic ectasia: Secondary | ICD-10-CM | POA: Insufficient documentation

## 2015-10-11 DIAGNOSIS — Z951 Presence of aortocoronary bypass graft: Secondary | ICD-10-CM | POA: Insufficient documentation

## 2015-10-11 DIAGNOSIS — J439 Emphysema, unspecified: Secondary | ICD-10-CM | POA: Insufficient documentation

## 2015-10-11 DIAGNOSIS — I2510 Atherosclerotic heart disease of native coronary artery without angina pectoris: Secondary | ICD-10-CM | POA: Diagnosis not present

## 2015-10-11 DIAGNOSIS — N63 Unspecified lump in breast: Secondary | ICD-10-CM | POA: Insufficient documentation

## 2015-10-11 DIAGNOSIS — R911 Solitary pulmonary nodule: Secondary | ICD-10-CM | POA: Insufficient documentation

## 2015-10-11 DIAGNOSIS — I700 Atherosclerosis of aorta: Secondary | ICD-10-CM | POA: Diagnosis not present

## 2015-10-11 DIAGNOSIS — D3501 Benign neoplasm of right adrenal gland: Secondary | ICD-10-CM | POA: Diagnosis not present

## 2015-10-11 LAB — GLUCOSE, CAPILLARY: GLUCOSE-CAPILLARY: 114 mg/dL — AB (ref 65–99)

## 2015-10-11 MED ORDER — FLUDEOXYGLUCOSE F - 18 (FDG) INJECTION
8.5500 | Freq: Once | INTRAVENOUS | Status: AC | PRN
  Administered 2015-10-11: 8.55 via INTRAVENOUS

## 2015-10-11 NOTE — Telephone Encounter (Signed)
Oncology Nurse Navigator Documentation  Oncology Nurse Navigator Flowsheets 10/11/2015  Navigator Encounter Type Telephone/Patient is getting PET scan today.  I called today to schedule her to see thoracic surgery per Dr. Julien Nordmann.  I left message with her husband to call when she gets back.    Telephone Outgoing Call  Treatment Phase Abnormal Scans  Barriers/Navigation Needs Coordination of Care  Interventions Coordination of Care  Coordination of Care Appts  Acuity Level 1  Time Spent with Patient 15

## 2015-10-11 NOTE — Progress Notes (Signed)
I went to the radiology dept to see Denise Mckenzie.  I spoke to her about her scan.  She stated she would like to wait until I can review with Dr. Julien Nordmann before she is scheduled with Dr. Servando Snare.  I stated I would follow up with Dr. Julien Nordmann and her Monday.

## 2015-10-14 ENCOUNTER — Telehealth: Payer: Self-pay | Admitting: *Deleted

## 2015-10-14 NOTE — Telephone Encounter (Signed)
Patient called back with questions about PET scan.  I updated and stated Dr. Julien Nordmann would go over results this Thursday.  She was thankful for the call back.

## 2015-10-14 NOTE — Telephone Encounter (Signed)
Oncology Nurse Navigator Documentation  Oncology Nurse Navigator Flowsheets 10/14/2015  Navigator Location [facility]  Navigator Encounter Type Telephone/I followed up with Dr. Julien Nordmann regarding PET scan.  He would like to see her this Thursday to go over results.  He would also like patient to be seen by Dr. Servando Snare.  I called [facility] and received an appt for 10/22/15 arrive at 3:45 at Dr. Everrett Coombe office.  I called patient to update  Telephone Outgoing Call  Treatment Phase Abnormal Scans  Barriers/Navigation Needs Coordination of Care  Interventions Coordination of Care  Coordination of Care Appts  Acuity Level 1  Time Spent with Patient 30

## 2015-10-16 ENCOUNTER — Telehealth: Payer: Self-pay | Admitting: *Deleted

## 2015-10-16 NOTE — Telephone Encounter (Signed)
Called and confirmed 10/17/15 clinic appt w/ pt.

## 2015-10-17 ENCOUNTER — Encounter: Payer: Self-pay | Admitting: Internal Medicine

## 2015-10-17 ENCOUNTER — Encounter: Payer: Self-pay | Admitting: *Deleted

## 2015-10-17 ENCOUNTER — Ambulatory Visit: Payer: Medicare Other | Attending: Internal Medicine | Admitting: Physical Therapy

## 2015-10-17 ENCOUNTER — Ambulatory Visit (HOSPITAL_BASED_OUTPATIENT_CLINIC_OR_DEPARTMENT_OTHER): Payer: Medicare Other | Admitting: Internal Medicine

## 2015-10-17 ENCOUNTER — Telehealth: Payer: Self-pay | Admitting: Internal Medicine

## 2015-10-17 ENCOUNTER — Other Ambulatory Visit (HOSPITAL_BASED_OUTPATIENT_CLINIC_OR_DEPARTMENT_OTHER): Payer: Medicare Other

## 2015-10-17 VITALS — BP 152/71 | HR 70 | Temp 98.5°F | Resp 18 | Ht 62.0 in | Wt 173.6 lb

## 2015-10-17 DIAGNOSIS — R911 Solitary pulmonary nodule: Secondary | ICD-10-CM

## 2015-10-17 DIAGNOSIS — Z7409 Other reduced mobility: Secondary | ICD-10-CM | POA: Diagnosis present

## 2015-10-17 DIAGNOSIS — M545 Low back pain, unspecified: Secondary | ICD-10-CM

## 2015-10-17 DIAGNOSIS — R5381 Other malaise: Secondary | ICD-10-CM | POA: Diagnosis present

## 2015-10-17 NOTE — Progress Notes (Signed)
Oncology Nurse Navigator Documentation  Oncology Nurse Navigator Flowsheets 10/17/2015  Navigator Location [facility]  Navigator Encounter Type Clinic/MDC/spoke with patient and friend.  I gave and explained next steps for patient.  She needs work up with PFT's and seeing cardiology.  I will follow up to help expedite appt's.  She was anxious and I listened as she explained.  She stated she was less anxious talking with her care team.    Abnormal Finding Date 09/02/2015  Patient Visit Type [facility]  Treatment Phase Abnormal Scans  Barriers/Navigation Needs Education;Coordination of Care  Education Other  Interventions Coordination of Care  Coordination of Care Appts  Acuity Level 2  Time Spent with Patient 30

## 2015-10-17 NOTE — Progress Notes (Signed)
Called Ms. Labuda's PCP to get updated notes faxed regarding breast work up.

## 2015-10-17 NOTE — Progress Notes (Signed)
[place] [phone]   [phone] Multidisciplinary thoracic oncology clinic  OFFICE PROGRESS NOTE  ROBERTS, Sharol Given, [place], [place] [address]  DIAGNOSIS: Questionable is stage IA (T1a, N0, M0) non-small cell lung cancer presented with right upper lobe lung nodule  PRIOR THERAPY: None  CURRENT THERAPY: None  INTERVAL HISTORY: Denise Mckenzie 70 y.o. female returns to the clinic today for follow-up visit accompanied by her niece. The patient is feeling fine today was no specific complaints except for anxiety. She denied having any significant chest pain, shortness breath, cough or hemoptysis. The patient denied having any significant weight loss or night sweats. She has no nausea or vomiting. She had a PET scan performed recently and she is here for evaluation and discussion of her PET scan results and further recommendation regarding her condition. She has a history of questionable lesion in the left breast and this has been evaluated several times by the breast Center and she is currently under close observation.   MEDICAL HISTORY: Past Medical History  Diagnosis Date  . Coronary artery disease   . Hyperlipidemia   . Hypertension     ALLERGIES:  has No Known Allergies.  MEDICATIONS:  Current Outpatient Prescriptions  Medication Sig Dispense Refill  . amitriptyline (ELAVIL) 50 MG tablet Take 50 mg by mouth at bedtime.      Marland Kitchen aspirin 325 MG tablet Take 325 mg by mouth daily.      Marland Kitchen atorvastatin (LIPITOR) 40 MG tablet Take 40 mg by mouth daily.      Marland Kitchen CALCIUM PO Take 1,800 mg by mouth daily.      . fish oil-omega-3 fatty acids 1000 MG capsule Take by mouth daily.      Marland Kitchen ibandronate (BONIVA) 150 MG tablet Take 1 tablet by mouth every 30 (thirty) days.  4  . levothyroxine (SYNTHROID, LEVOTHROID) 88 MCG tablet Take 88 mcg by mouth daily.      Marland Kitchen losartan-hydrochlorothiazide (HYZAAR) 100-12.5 MG per tablet Take 1 tablet  by mouth daily.      . metFORMIN (GLUCOPHAGE) 500 MG tablet Take 500 mg by mouth daily.  4  . metoprolol (LOPRESSOR) 50 MG tablet Take 25 mg by mouth 2 (two) times daily.      . nitroGLYCERIN (NITROSTAT) 0.4 MG SL tablet Place 0.4 mg under the tongue every 5 (five) minutes as needed.       No current facility-administered medications for this visit.    SURGICAL HISTORY:  Past Surgical History  Procedure Laterality Date  . Coronary artery bypass graft      REVIEW OF SYSTEMS:  Constitutional: negative Eyes: negative Ears, nose, mouth, throat, and face: negative Respiratory: negative Cardiovascular: negative Gastrointestinal: negative Genitourinary:negative Integument/breast: negative Hematologic/lymphatic: negative Musculoskeletal:negative Neurological: negative Behavioral/Psych: positive for anxiety Endocrine: negative Allergic/Immunologic: negative   PHYSICAL EXAMINATION: General appearance: alert, cooperative and appears stated age Head: Normocephalic, without obvious abnormality, atraumatic Neck: no adenopathy, no JVD, supple, symmetrical, trachea midline and thyroid not enlarged, symmetric, no tenderness/mass/nodules Lymph nodes: Cervical, supraclavicular, and axillary nodes normal. Resp: clear to auscultation bilaterally Back: symmetric, no curvature. ROM normal. No CVA tenderness. Cardio: regular rate and rhythm, S1, S2 normal, no murmur, click, rub or gallop GI: soft, non-tender; bowel sounds normal; no masses,  no organomegaly Extremities: extremities normal, atraumatic, no cyanosis or edema Neurologic: Alert and oriented X 3, normal strength and tone. Normal symmetric reflexes. Normal coordination and gait  ECOG PERFORMANCE STATUS:  1 - Symptomatic but completely ambulatory  Blood pressure 152/71, pulse 70, temperature 98.5 F (36.9 C), temperature source Oral, resp. rate 18, height '5\' 2"'$  (1.575 m), weight 173 lb 9.6 oz (78.744 kg), SpO2 96 %.  LABORATORY  DATA: Lab Results  Component Value Date   WBC 10.2 10/01/2015   HGB 15.2 10/01/2015   HCT 45.5 10/01/2015   MCV 89.8 10/01/2015   PLT 212 10/01/2015      Chemistry      Component Value Date/Time   NA 135* 10/01/2015 1335   K 3.6 10/01/2015 1335   CO2 30* 10/01/2015 1335   BUN 11.5 10/01/2015 1335   CREATININE 0.8 10/01/2015 1335      Component Value Date/Time   CALCIUM 9.6 10/01/2015 1335   ALKPHOS 106 10/01/2015 1335   AST 14 10/01/2015 1335   ALT 18 10/01/2015 1335   BILITOT 0.44 10/01/2015 1335       RADIOGRAPHIC STUDIES: Nm Pet Image Initial (pi) Skull Base To Thigh  10/11/2015  CLINICAL DATA:  Initial treatment strategy for right upper lobe pulmonary nodule. EXAM: NUCLEAR MEDICINE PET SKULL BASE TO THIGH TECHNIQUE: 8.6 mCi F-18 FDG was injected intravenously. Full-ring PET imaging was performed from the skull base to thigh after the radiotracer. CT data was obtained and used for attenuation correction and anatomic localization. FASTING BLOOD GLUCOSE:  Value: 114 mg/dl COMPARISON:  09/11/2015 chest CT. FINDINGS: NECK No hypermetabolic lymph nodes in the neck. Subcentimeter coarse calcification in the right thyroid lobe. No hypermetabolic thyroid nodules. CHEST There is an intensely hypermetabolic 1.4 x 1.2 cm right upper lobe pulmonary nodule (series 8/image 21) with max SUV 24.8. No hypermetabolic axillary, mediastinal or hilar nodes. Mild centrilobular and paraseptal emphysema with diffuse bronchial wall thickening. No acute consolidative airspace disease. There are at least 4 scattered tiny pulmonary nodules throughout the right lung, largest 4 mm in the anterior right upper lobe (series 8/image 32), which are below PET resolution and are nonspecific, probably benign. Extensive coronary atherosclerosis status post CABG with left internal mammary and ascending aortic bypass grafts. There is a hypermetabolic 1.0 cm soft tissue nodule in the far posterior left breast (series  5/image 76) with max SUV 4.1. ABDOMEN/PELVIS No abnormal hypermetabolic activity within the liver, pancreas, adrenal glands, or spleen. No hypermetabolic lymph nodes in the abdomen or pelvis. Non hypermetabolic 2.5 cm right adrenal mass with density 9 HU, in keeping with a benign adenoma. Ectasia of the atherosclerotic infrarenal abdominal aorta, maximum diameter 2.5 cm. Extensive subcutaneous soft tissue calcifications in the bilateral gluteal regions, most prominent overlying the right greater trochanter. SKELETON No focal hypermetabolic activity to suggest skeletal metastasis. IMPRESSION: 1. Intensely hypermetabolic 1.4 cm right upper lobe pulmonary nodule, in keeping with a primary bronchogenic carcinoma. 2. No hypermetabolic thoracic nodal metastases. No hypermetabolic distant metastatic disease. PET-CT staging is stage IA (T1a N0 M0). 3. Hypermetabolic 1.0 cm soft tissue nodule in the far posterior left breast, which is nonspecific, cannot exclude a primary left breast malignancy. Recommend further evaluation with diagnostic mammography. 4. Additional findings include extensive coronary atherosclerosis status post CABG, mild emphysema with diffuse bronchial wall thickening suggesting COPD, right adrenal adenoma and ectatic atherosclerotic infrarenal abdominal aorta. Electronically Signed   By: Ilona Sorrel M.D.   On: 10/11/2015 13:49    ASSESSMENT AND PLAN: This is a very pleasant 70 years old white female with questionable stage IA non-small cell lung cancer presented with right upper lobe pulmonary nodule. The patient also has soft tissue  nodule in the posterior left breast area which is nonspecific and was evaluated by the breast Center and currently under close observation. I discussed the PET scan results with the patient and her family member today and recommended for her to see Dr. Servando Snare for consideration of surgical resection. I will arrange for the patient to have pulmonary function test and I  also requested referral to her cardiologist Dr. Acie Fredrickson for cardiac evaluation before her surgery. If she is not a good surgical candidate, we would consider the patient for CT-guided core biopsy followed by stereotactic radiotherapy. I will complete the staging workup by ordering a CT scan of the head with contrast to rule out any metastatic disease to the brain. The patient agreed to the current plan. I will see her back for follow-up visit in 2 months for reevaluation with repeat blood work after her surgical resection. The patient was seen during the multidisciplinary thoracic oncology clinic today by medical oncology, thoracic navigator, social worker and physical therapist. She was advised to call immediately if she has any concerning symptoms in the interval. The patient voices understanding of current disease status and treatment options and is in agreement with the current care plan.  All questions were answered. The patient knows to call the clinic with any problems, questions or concerns. We can certainly see the patient much sooner if necessary.  I spent 20 minutes counseling the patient face to face. The total time spent in the appointment was 30 minutes.  Disclaimer: This note was dictated with voice recognition software. Similar sounding words can inadvertently be transcribed and may not be corrected upon review.

## 2015-10-17 NOTE — Therapy (Signed)
[place], [place], [address] Phone: [phone]   Fax:  [phone]  Physical Therapy Evaluation  Patient Details  Name: Denise Mckenzie MRN: [id number] Date of Birth: 19-Jan-1946 Referring Provider: Dr. Curt Bears  Encounter Date: 10/17/2015      PT End of Session - 10/17/15 1642    Visit Number 1   Number of Visits 1   PT Start Time 1525   PT Stop Time 1558   PT Time Calculation (min) 33 min   Activity Tolerance Patient tolerated treatment well   Behavior During Therapy [facility] for tasks assessed/performed      Past Medical History  Diagnosis Date  . Coronary artery disease   . Hyperlipidemia   . Hypertension     Past Surgical History  Procedure Laterality Date  . Coronary artery bypass graft      There were no vitals filed for this visit.  Visit Diagnosis:  Midline low back pain without sciatica - Plan: PT plan of care cert/re-cert  Physical deconditioning - Plan: PT plan of care cert/re-cert  Mobility impaired - Plan: PT plan of care cert/re-cert      Subjective Assessment - 10/17/15 1601    Subjective reports chronic back pain and osteoporosis   Patient is accompained by: Family member  niece   Pertinent History presented to PCP for annual exam with c/o cough; work-up indicated right lung nodular density; no pathology yet for this stage I lung cancer.  h/o breast mass worked up last year and was negative; ex-smoker quit 1996; CABG 1996; HTN, osteoporosis.  Reports she has right hip calcifications that bother her intermittently.   Patient Stated Goals get info from all lung clinic providers   Currently in Pain? No/denies   Pain Score 0-No pain  up to 10/10   Pain Location Back   Pain Descriptors / Indicators Aching   Pain Type Chronic pain   Pain Onset More than a month ago   Aggravating Factors  standing still a long time, lifting, vacuuming   Pain Relieving Factors sitting or  lying down   Effect of Pain on Daily Activities limits time standing still; needs cart with shopping            [facility] PT Assessment - 10/17/15 0001    Assessment   Medical Diagnosis right lung nodule, stage I without pathology yet   Referring Provider Dr. Curt Bears   Onset Date/Surgical Date 09/02/15   Precautions   Precautions Other (comment)   Precaution Comments cancer precaution   Restrictions   Weight Bearing Restrictions No   Balance Screen   Has the patient fallen in the past 6 months No   Has the patient had a decrease in activity level because of a fear of falling?  No   Is the patient reluctant to leave their home because of a fear of falling?  No   Home Ecologist residence   Living Arrangements Spouse/significant other   Type of [place] One level   Prior Function   Level of Independence Independent   Leisure no current exercise; has a treadmill and had been walking on it once a week or so for 30 minutes at a good pace; not done recently because of family needs   Cognition   Overall Cognitive Status Within Functional Limits for tasks assessed   Functional Tests   Functional tests Sit to Stand  Sit to Stand   Comments 9 times in 30 seconds with mild dyspnea; reports she moved slowly due to fear of causing back pain  slightly below average for age   Posture/Postural Control   Posture/Postural Control No significant limitations   ROM / Strength   AROM / PROM / Strength AROM   AROM   Overall AROM Comments standing trunk AROM:  limited 25% in extension; others WFL; had pain on return to standing from flexion   Ambulation/Gait   Ambulation/Gait Yes   Ambulation/Gait Assistance 6: Modified independent (Device/Increase time)   Assistive device Straight cane  sometimes cane, but leans on cart or rides cart at store   Stairs Yes   Stair Management Technique Other (comment)  can only do 4 steps, needs railing    Balance   Balance Assessed Yes   Dynamic Standing Balance   Dynamic Standing - Comments reaches forward 7 inches in standing, below average for age                           PT Education - Oct 31, 2015 1641    Education provided Yes   Education Details posture, breathing, energy conservation, cough splinting, walking, Cure article on staying active, PT info   Person(s) Educated Patient;Other (comment)  niece   Methods Explanation;Handout   Comprehension Verbalized understanding               Lung Clinic Goals - 2015/10/31 1648    Patient will be able to verbalize understanding of the benefit of exercise to decrease fatigue.   Status Achieved   Patient will be able to verbalize the importance of posture.   Status Achieved   Patient will be able to demonstrate diaphragmatic breathing for improved lung function.   Status Achieved   Patient will be able to verbalize understanding of the role of physical therapy to prevent functional decline and who to contact if physical therapy is needed.   Status Achieved             Plan - 10/31/15 1643    Clinical Impression Statement Patient with newly found lung mass which has not been biopsied yet, but through scans is thought to be stage I lung cancer; she is expected to have resection of the mass but first needs cardiac clearance.  If tumor is resected, no other treatment is planned other than surveillance.  She has chronic back pain which interferes with mobility, mild dyspnea.  She reports difficulty with negotiating stairs.   Pt will benefit from skilled therapeutic intervention in order to improve on the following deficits Pain;Decreased activity tolerance;Decreased mobility   Rehab Potential Good   PT Frequency One time visit  but may benefit from prehab before surgery   PT Treatment/Interventions Patient/family education   PT Next Visit Plan Patient may benefit from therapy to improve lung function prior to  surgery; she may also benefit from therapy for back pain.   PT Home Exercise Plan see education section   Consulted and Agree with Plan of Care Patient          G-Codes - 2015-10-31 1648    Functional Assessment Tool Used clinical judgement   Functional Limitation Mobility: Walking and moving around   Mobility: Walking and Moving Around Current Status (O3785) At least 40 percent but less than 60 percent impaired, limited or restricted   Mobility: Walking and Moving Around Goal Status (Y8502) At least 40 percent but less than  60 percent impaired, limited or restricted       Problem List Patient Active Problem List   Diagnosis Date Noted  . Lung nodule 09/19/2015  . Coronary artery disease   . Hyperlipidemia   . Hypertension     SALISBURY,DONNA 10/17/2015, 4:51 PM  [place], [place], [address] Phone: [phone]   Fax:  [phone]  Name: Denise Mckenzie MRN: [id number] Date of Birth: 27-Mar-1946   Serafina Royals, PT 10/17/2015 4:51 PM

## 2015-10-17 NOTE — Progress Notes (Signed)
[place] Clinical Social Work  Clinical Social Work met with patient/family and Futures trader at [facility] appointment to offer support and assess for psychosocial needs.  Medical oncologist reviewed patient's diagnosis and recommended plan for surgery with patient/family.  Denise Mckenzie was accompanied by her niece today, her spouse stayed home due to feeling sick.  Denise Mckenzie shared she was still anxious, but felt relieved that surgery is an option.  She reported feeling eager and anxious to meet with Dr. Servando Snare (who previously completed her bypass surgery in 1996.  ONCBCN DISTRESS SCREENING 10/17/2015  Screening Type Initial Screening  Distress experienced in past week (1-10) 9  Family Problem type Children  Emotional problem type Nervousness/Anxiety;Adjusting to illness  Referral to clinical social work Yes   Clinical Social Work briefly discussed Clinical Social Work role and [employer] support programs/services.  Clinical Social Work encouraged patient to call with any additional questions or concerns.   Denise Mckenzie, MSW, LCSW, OSW-C Clinical Social Worker [facility] [phone]

## 2015-10-17 NOTE — Telephone Encounter (Signed)
lvm for pt regarding to pft 3.8.17, cardiology 3.9, and f/u

## 2015-10-18 ENCOUNTER — Telehealth: Payer: Self-pay | Admitting: *Deleted

## 2015-10-18 DIAGNOSIS — R911 Solitary pulmonary nodule: Secondary | ICD-10-CM

## 2015-10-18 NOTE — Telephone Encounter (Signed)
"  Per CT Department a CT Head with contrast must have a CT without performed before this test.  The CT Head wo contrast on 09-11-2015 is too far out.  She will need CT head W Wo contrast ordered."   This nurse entered test for Dr, Julien Nordmann to co-sign to authorize.   CT head has been scheduled on 10-22-2015 at 12:30 pm as requested by patient.  arival time 12:15.  [place] Ar reports she has gone over instructions with patient.

## 2015-10-21 ENCOUNTER — Telehealth: Payer: Self-pay | Admitting: *Deleted

## 2015-10-21 NOTE — Telephone Encounter (Signed)
Oncology Nurse Navigator Documentation  Oncology Nurse Navigator Flowsheets 10/21/2015  Navigator Encounter Type Telephone/I called Denise Mckenzie to follow up from thoracic clinic on 10/17/15.  I updated her on her scans and appt with Dr. Servando Snare.  She had questions about next steps and I clarified.  She was thankful for the call.   Telephone Outgoing Call  Treatment Phase Pre-Tx/Tx Discussion  Barriers/Navigation Needs Education  Acuity Level 1  Time Spent with Patient 15

## 2015-10-22 ENCOUNTER — Institutional Professional Consult (permissible substitution) (INDEPENDENT_AMBULATORY_CARE_PROVIDER_SITE_OTHER): Payer: Medicare Other | Admitting: Cardiothoracic Surgery

## 2015-10-22 ENCOUNTER — Encounter (HOSPITAL_COMMUNITY): Payer: Self-pay

## 2015-10-22 ENCOUNTER — Ambulatory Visit (HOSPITAL_COMMUNITY)
Admission: RE | Admit: 2015-10-22 | Discharge: 2015-10-22 | Disposition: A | Discharge status: Home / Self Care | Payer: Medicare Other | Source: Ambulatory Visit | Attending: Internal Medicine | Admitting: Internal Medicine

## 2015-10-22 ENCOUNTER — Encounter: Payer: Self-pay | Admitting: Cardiothoracic Surgery

## 2015-10-22 ENCOUNTER — Ambulatory Visit (HOSPITAL_COMMUNITY): Payer: Medicare Other

## 2015-10-22 VITALS — BP 130/83 | HR 96 | Resp 20 | Ht 62.0 in | Wt 173.0 lb

## 2015-10-22 DIAGNOSIS — R911 Solitary pulmonary nodule: Secondary | ICD-10-CM | POA: Diagnosis present

## 2015-10-22 DIAGNOSIS — Z972 Presence of dental prosthetic device (complete) (partial): Secondary | ICD-10-CM | POA: Insufficient documentation

## 2015-10-22 DIAGNOSIS — E039 Hypothyroidism, unspecified: Secondary | ICD-10-CM

## 2015-10-22 DIAGNOSIS — M810 Age-related osteoporosis without current pathological fracture: Secondary | ICD-10-CM

## 2015-10-22 DIAGNOSIS — E119 Type 2 diabetes mellitus without complications: Secondary | ICD-10-CM | POA: Insufficient documentation

## 2015-10-22 DIAGNOSIS — R918 Other nonspecific abnormal finding of lung field: Secondary | ICD-10-CM | POA: Diagnosis not present

## 2015-10-22 DIAGNOSIS — K08109 Complete loss of teeth, unspecified cause, unspecified class: Secondary | ICD-10-CM | POA: Insufficient documentation

## 2015-10-22 HISTORY — DX: Type 2 diabetes mellitus without complications: E11.9

## 2015-10-22 HISTORY — DX: Hypothyroidism, unspecified: E03.9

## 2015-10-22 HISTORY — DX: Age-related osteoporosis without current pathological fracture: M81.0

## 2015-10-22 MED ORDER — IOHEXOL 300 MG/ML  SOLN
75.0000 mL | Freq: Once | INTRAMUSCULAR | Status: AC | PRN
  Administered 2015-10-22: 75 mL via INTRAVENOUS

## 2015-10-22 NOTE — Progress Notes (Signed)
[address]       [place] [address]             [phone]                    [name] [facility] Medical Record #[id number] Date of Birth: Nov 07, 1945  Referring: Dr Mayme Genta Primary Care: Myriam Jacobson, MD  Chief Complaint:    Chief Complaint  Patient presents with  . Lung Mass    Surgical eval, CT head 10/15/15, PET Scan 10/04/15,Chest CT 09/13/15, PFT's 10/21/15    History of Present Illness:    Denise Mckenzie 70 y.o. female is seen in the office  today for evaluation  of right lung lesion. Patient has had no symptoms, denies sob or hemoptysis. She has distant history of smoking for 35 yesrs but quit in 1996 after I preformed CABG on her. She had "routine" chest xray thet revealed right upper lung mass, CT scan and PET scan done.   She had a PET scan performed recently. On PET there was noted a intensely hypermetabolic 1.4 cm right upper lobe pulmonary nodule, in keeping with a primary bronchogenic carcinoma and  No hypermetabolic thoracic nodal metastases. No hypermetabolic distant metastatic disease consistent with  clinicial  staging  stage IA (T1a N0 M0).  In addition a  hypermetabolic 1.0 cm soft tissue nodule in the far posterior left breast, which is nonspecific, cannot exclude a primary left breast malignancy. She has a history of questionable lesion in the left breast and this has been evaluated several times by the breast Center and she is currently under close observation   Current Activity/ Functional Status:  Patient is independent with mobility/ambulation, transfers, ADL's, IADL's.   Zubrod Score: At the time of surgery this patient's most appropriate activity status/level should be described as: '[x]'$     0    Normal activity, no symptoms '[]'$     1    Restricted in physical strenuous activity but ambulatory, able to do out light work '[]'$     2    Ambulatory and capable of self care, unable to do work activities, up and about                >50 % of waking hours                              '[]'$     3    Only limited self care, in bed greater than 50% of waking hours '[]'$     4    Completely disabled, no self care, confined to bed or chair '[]'$     5    Moribund   Past Medical History  Diagnosis Date  . Coronary artery disease   . Hyperlipidemia   . Hypertension   . Hypothyroidism 10/22/2015  . Osteoporosis 10/22/2015  . Diabetes mellitus ([place]) 10/22/2015    Past Surgical History  Procedure Laterality Date  . Coronary artery bypass graft      11/1994  . Abdominal hysterectomy      1968  . Feet surgery      1991  . Bladder surgery      1972  . Tmj arthroplasty      1979    Family History  Problem Relation Age of Onset  . Alzheimer's disease Mother   . Heart attack Father     Social History  Social History  . Marital Status: Single    Spouse Name: N/A  . Number of Children: N/A  . Years of Education: N/A   Occupational History  . Not on file.   Social History Main Topics  . Smoking status: Former Smoker    Quit date: 08/17/1994  . Smokeless tobacco: Not on file  . Alcohol Use: No  . Drug Use: No  . Sexual Activity: Not on file   Other Topics Concern  . Not on file   Social History Narrative    History  Smoking status  . Former Smoker  . Quit date: 08/17/1994  Smokeless tobacco  . Not on file    History  Alcohol Use No     No Known Allergies  Current Outpatient Prescriptions  Medication Sig Dispense Refill  . amitriptyline (ELAVIL) 50 MG tablet Take 50 mg by mouth at bedtime.      Marland Kitchen aspirin 325 MG tablet Take 325 mg by mouth daily.      Marland Kitchen atorvastatin (LIPITOR) 40 MG tablet Take 40 mg by mouth daily.      Marland Kitchen CALCIUM PO Take 1,800 mg by mouth daily.      . fish oil-omega-3 fatty acids 1000 MG capsule Take by mouth daily.      Marland Kitchen ibandronate (BONIVA) 150 MG tablet Take 1 tablet by mouth every 30 (thirty) days.  4  . levothyroxine (SYNTHROID, LEVOTHROID) 88 MCG tablet Take 88 mcg by mouth  daily.      Marland Kitchen losartan-hydrochlorothiazide (HYZAAR) 100-12.5 MG per tablet Take 1 tablet by mouth daily.      . metFORMIN (GLUCOPHAGE) 500 MG tablet Take 500 mg by mouth daily.  4  . metoprolol (LOPRESSOR) 50 MG tablet Take 25 mg by mouth 2 (two) times daily.      . nitroGLYCERIN (NITROSTAT) 0.4 MG SL tablet Place 0.4 mg under the tongue every 5 (five) minutes as needed.       No current facility-administered medications for this visit.      Review of Systems:     Cardiac Review of Systems: Y or N  Chest Pain [  n  ]  Resting SOB [n   ] Exertional SOB  Blue.Reese  ]  Orthopnea [  n ]   Pedal Edema Florencio.Farrier   ]    Palpitations [ n ] Syncope  [ n ]   Presyncope [n   ]  General Review of Systems: [Y] = yes [  ]=no Constitional: recent weight change [  ];  Wt loss over the last 3 months [   ] anorexia [  ]; fatigue [  ]; nausea [  ]; night sweats [  ]; fever [  ]; or chills [  ];          Dental: poor dentition[  ]; Last Dentist visit:   Eye : blurred vision [  ]; diplopia [   ]; vision changes [  ];  Amaurosis fugax[  ]; Resp: cough [  ];  wheezing[  ];  hemoptysis[  ]; shortness of breath[  ]; paroxysmal nocturnal dyspnea[  ]; dyspnea on exertion[y  ]; or orthopnea[  ];  GI:  gallstones[  ], vomiting[  ];  dysphagia[  ]; melena[  ];  hematochezia [  ]; heartburn[  ];   Hx of  Colonoscopy[  ]; GU: kidney stones [  ]; hematuria[  ];   dysuria [  ];  nocturia[  ];  history of  obstruction [  ]; urinary frequency [  ]             Skin: rash, swelling[  ];, hair loss[  ];  peripheral edema[  ];  or itching[  ]; Musculosketetal: myalgias[  ];  joint swelling[  ];  joint erythema[  ];  joint pain[  ];  back pain[  ];  Heme/Lymph: bruising[  ];  bleeding[  ];  anemia[  ];  Neuro: TIA[n  ];  headaches[  n];  stroke[  n];  vertigo[  ];  seizures[  ];   paresthesias[  ];  difficulty walking[n  ];  Psych:depression[  ]; anxiety[  ];  Endocrine: diabetes[ n ];  thyroid dysfunction[  ];  Immunizations: Flu up to  date Blue.Reese  ]; Pneumococcal up to date Blue.Reese  ];  Other:  Physical Exam: BP 130/83 mmHg  Pulse 96  Resp 20  Ht '5\' 2"'$  (1.575 m)  Wt 173 lb (78.472 kg)  BMI 31.63 kg/m2  SpO2 97%  PHYSICAL EXAMINATION: General appearance: alert, cooperative, appears stated age and no distress Head: Normocephalic, without obvious abnormality, atraumatic Neck: no adenopathy, no carotid bruit, no JVD, supple, symmetrical, trachea midline and thyroid not enlarged, symmetric, no tenderness/mass/nodules Lymph nodes: Cervical, supraclavicular, and axillary nodes normal. Resp: clear to auscultation bilaterally Back: symmetric, no curvature. ROM normal. No CVA tenderness. Cardio: regular rate and rhythm, S1, S2 normal, no murmur, click, rub or gallop GI: soft, non-tender; bowel sounds normal; no masses,  no organomegaly Extremities: extremities normal, atraumatic, no cyanosis or edema and Homans sign is negative, no sign of DVT Neurologic: Grossly normal  Diagnostic Studies & Laboratory data:     Recent Radiology Findings:   Ct Head W Wo Contrast  10/22/2015  CLINICAL DATA:  71 year old female with new diagnosis of right upper lobe lung cancer. Staging. Subsequent encounter. EXAM: CT HEAD WITHOUT AND WITH CONTRAST TECHNIQUE: Contiguous axial images were obtained from the base of the skull through the vertex without and with intravenous contrast CONTRAST:  15m OMNIPAQUE IOHEXOL 300 MG/ML  SOLN COMPARISON:  PET-CT 10/11/2015 FINDINGS: Visualized paranasal sinuses and mastoids are clear. Scattered small nonspecific lucent areas in the calvarium (e.g. Series 4, image 20) with no destructive osseous lesion identified. Remote postoperative changes to the posterior left zygomatic arch. Visualized orbits and scalp soft tissues are within normal limits. Calcified atherosclerosis at the skull base. Cerebral volume is within normal limits for age. No midline shift, ventriculomegaly, mass effect, evidence of mass lesion, intracranial  hemorrhage or evidence of cortically based acute infarction. Gray-white matter differentiation is within normal limits throughout the brain. No abnormal enhancement identified. Major intracranial vascular structures are enhancing. IMPRESSION: 1. Normal CT appearance of the brain. No metastatic disease to the brain identified. 2. Occasional small indeterminate lucent areas in the calvarium, favor benign in light of no body PET-CT evidence of skeletal metastasis recently, but attention directed on followup. Electronically Signed   By: HGenevie AnnM.D.   On: 10/22/2015 13:16   Nm Pet Image Initial (pi) Skull Base To Thigh  10/11/2015  CLINICAL DATA:  Initial treatment strategy for right upper lobe pulmonary nodule. EXAM: NUCLEAR MEDICINE PET SKULL BASE TO THIGH TECHNIQUE: 8.6 mCi F-18 FDG was injected intravenously. Full-ring PET imaging was performed from the skull base to thigh after the radiotracer. CT data was obtained and used for attenuation correction and anatomic localization. FASTING BLOOD GLUCOSE:  Value: 114 mg/dl COMPARISON:  09/11/2015 chest CT. FINDINGS: NECK No hypermetabolic  lymph nodes in the neck. Subcentimeter coarse calcification in the right thyroid lobe. No hypermetabolic thyroid nodules. CHEST There is an intensely hypermetabolic 1.4 x 1.2 cm right upper lobe pulmonary nodule (series 8/image 21) with max SUV 24.8. No hypermetabolic axillary, mediastinal or hilar nodes. Mild centrilobular and paraseptal emphysema with diffuse bronchial wall thickening. No acute consolidative airspace disease. There are at least 4 scattered tiny pulmonary nodules throughout the right lung, largest 4 mm in the anterior right upper lobe (series 8/image 32), which are below PET resolution and are nonspecific, probably benign. Extensive coronary atherosclerosis status post CABG with left internal mammary and ascending aortic bypass grafts. There is a hypermetabolic 1.0 cm soft tissue nodule in the far posterior left  breast (series 5/image 76) with max SUV 4.1. ABDOMEN/PELVIS No abnormal hypermetabolic activity within the liver, pancreas, adrenal glands, or spleen. No hypermetabolic lymph nodes in the abdomen or pelvis. Non hypermetabolic 2.5 cm right adrenal mass with density 9 HU, in keeping with a benign adenoma. Ectasia of the atherosclerotic infrarenal abdominal aorta, maximum diameter 2.5 cm. Extensive subcutaneous soft tissue calcifications in the bilateral gluteal regions, most prominent overlying the right greater trochanter. SKELETON No focal hypermetabolic activity to suggest skeletal metastasis. IMPRESSION: 1. Intensely hypermetabolic 1.4 cm right upper lobe pulmonary nodule, in keeping with a primary bronchogenic carcinoma. 2. No hypermetabolic thoracic nodal metastases. No hypermetabolic distant metastatic disease. PET-CT staging is stage IA (T1a N0 M0). 3. Hypermetabolic 1.0 cm soft tissue nodule in the far posterior left breast, which is nonspecific, cannot exclude a primary left breast malignancy. Recommend further evaluation with diagnostic mammography. 4. Additional findings include extensive coronary atherosclerosis status post CABG, mild emphysema with diffuse bronchial wall thickening suggesting COPD, right adrenal adenoma and ectatic atherosclerotic infrarenal abdominal aorta. Electronically Signed   By: Ilona Sorrel M.D.   On: 10/11/2015 13:49     I have independently reviewed the above radiologic studies.  Recent Lab Findings: Lab Results  Component Value Date   WBC 10.2 10/01/2015   HGB 15.2 10/01/2015   HCT 45.5 10/01/2015   PLT 212 10/01/2015   GLUCOSE 115 10/01/2015   ALT 18 10/01/2015   AST 14 10/01/2015   NA 135* 10/01/2015   K 3.6 10/01/2015   CREATININE 0.8 10/01/2015   BUN 11.5 10/01/2015   CO2 30* 10/01/2015      Assessment / Plan:   1/  intensely hypermetabolic 1.4 cm right upper lobe pulmonary nodule, in keeping with a primary bronchogenic carcinoma and  No  hypermetabolic thoracic nodal metastases.      No hypermetabolic distant metastatic disease consistent with  clinicial  staging  stage IA (T1a N0 M0) 2/  Hypermetabolic 1.0 cm soft tissue nodule in the far posterior left breast, which is nonspecific, cannot exclude a primary left breast malignancy 3/ History CAD s/p cabg 1996    Plan PFT's tomorrow and cardiology clearance Thursday and follow up mammogram last one was one year ago with attention to left breast , will plan to see back in office and consider surgical resection of clinical stage IA lung cancer after above done    I  spent 40 minutes counseling the patient face to face and 50% or more the  time was spent in counseling and coordination of care. The total time spent in the appointment was 60 minutes.  Grace Isaac MD      [address] [place] [address] Office [phone]   Beeper [phone]  10/22/2015 4:16 PM

## 2015-10-23 ENCOUNTER — Other Ambulatory Visit: Payer: Self-pay | Admitting: Cardiothoracic Surgery

## 2015-10-23 ENCOUNTER — Other Ambulatory Visit: Payer: Self-pay | Admitting: *Deleted

## 2015-10-23 ENCOUNTER — Ambulatory Visit (HOSPITAL_COMMUNITY)
Admission: RE | Admit: 2015-10-23 | Discharge: 2015-10-23 | Disposition: A | Discharge status: Home / Self Care | Payer: Medicare Other | Source: Ambulatory Visit | Attending: Internal Medicine | Admitting: Internal Medicine

## 2015-10-23 DIAGNOSIS — R911 Solitary pulmonary nodule: Secondary | ICD-10-CM | POA: Diagnosis not present

## 2015-10-23 DIAGNOSIS — N63 Unspecified lump in unspecified breast: Secondary | ICD-10-CM

## 2015-10-23 LAB — PULMONARY FUNCTION TEST
DL/VA % pred: 48 %
DL/VA: 2.21 ml/min/mmHg/L
DLCO unc % pred: 28 %
DLCO unc: 6.19 ml/min/mmHg
FEF 25-75 Post: 0.56 L/sec
FEF 25-75 Pre: 0.47 L/sec
FEF2575-%Change-Post: 19 %
FEF2575-%Pred-Post: 32 %
FEF2575-%Pred-Pre: 27 %
FEV1-%Change-Post: 4 %
FEV1-%Pred-Post: 49 %
FEV1-%Pred-Pre: 47 %
FEV1-Post: 1.01 L
FEV1-Pre: 0.96 L
FEV1FVC-%Change-Post: -6 %
FEV1FVC-%Pred-Pre: 79 %
FEV6-%Change-Post: 11 %
FEV6-%Pred-Post: 68 %
FEV6-%Pred-Pre: 61 %
FEV6-Post: 1.76 L
FEV6-Pre: 1.59 L
FEV6FVC-%Change-Post: 0 %
FEV6FVC-%Pred-Post: 103 %
FEV6FVC-%Pred-Pre: 104 %
FVC-%Change-Post: 12 %
FVC-%Pred-Post: 66 %
FVC-%Pred-Pre: 59 %
FVC-Post: 1.8 L
FVC-Pre: 1.6 L
Post FEV1/FVC ratio: 56 %
Post FEV6/FVC ratio: 98 %
Pre FEV1/FVC ratio: 60 %
Pre FEV6/FVC Ratio: 99 %
RV % pred: 135 %
RV: 2.87 L
TLC % pred: 95 %
TLC: 4.52 L

## 2015-10-23 MED ORDER — ALBUTEROL SULFATE (2.5 MG/3ML) 0.083% IN NEBU
2.5000 mg | INHALATION_SOLUTION | Freq: Once | RESPIRATORY_TRACT | Status: AC
  Administered 2015-10-23: 2.5 mg via RESPIRATORY_TRACT

## 2015-10-24 ENCOUNTER — Encounter: Payer: Self-pay | Admitting: Cardiology

## 2015-10-24 ENCOUNTER — Ambulatory Visit (INDEPENDENT_AMBULATORY_CARE_PROVIDER_SITE_OTHER): Payer: Medicare Other | Admitting: Cardiology

## 2015-10-24 VITALS — BP 148/78 | HR 72 | Ht 62.0 in | Wt 169.0 lb

## 2015-10-24 DIAGNOSIS — E785 Hyperlipidemia, unspecified: Secondary | ICD-10-CM | POA: Diagnosis not present

## 2015-10-24 DIAGNOSIS — E119 Type 2 diabetes mellitus without complications: Secondary | ICD-10-CM | POA: Diagnosis not present

## 2015-10-24 DIAGNOSIS — Z01818 Encounter for other preprocedural examination: Secondary | ICD-10-CM

## 2015-10-24 DIAGNOSIS — I2510 Atherosclerotic heart disease of native coronary artery without angina pectoris: Secondary | ICD-10-CM

## 2015-10-24 NOTE — Progress Notes (Signed)
Cardiology Office Note  NEW PATIENT   Date:  10/24/2015   ID:  Denise Mckenzie, DOB 02/13/1946, MRN [id number]  PCP:  Myriam Jacobson, MD  Cardiologist:  NEW  Dr. Harrington Challenger  In 2012 was pt of Dr. Acie Fredrickson.   Chief Complaint  Patient presents with  . Pre-op Exam    no chest pain      History of Present Illness: Denise Mckenzie is a 70 y.o. female who presents for surgical clearance.  Recently seen by   Dr. Madolyn Frieze for lug lesion- rt lung mass then had PET scan and CT. Per Dr. Servando Snare "On PET there was noted a intensely hypermetabolic 1.4 cm right upper lobe pulmonary nodule, in keeping with a primary bronchogenic carcinoma and No hypermetabolic thoracic nodal metastases. No hypermetabolic distant metastatic disease consistent with clinicial staging stage IA (T1a N0 M0). In addition a hypermetabolic 1.0 cm soft tissue nodule in the far posterior left breast, which is nonspecific, cannot exclude a primary left breast malignancy. She has a history of questionable lesion in the left breast and this has been evaluated several times by the breast Center and she is currently under close observation"   Dr. Servando Snare ordered follow up mammogram with attention to left breast , He will see back in office and consider surgical resection of clinical stage IA lung cancer after above done   She is here for risk stratification.  Has not seen Dr. Acie Fredrickson since 2012.    She has a history of coronary artery disease and coronary artery bypass grafting in 1996. She also has a history of hypertension, hyperlipidemia, and hypothyroidism and diabetes.  She had a stress test in 2005 that was normal.  She denies any chest pain and has active lifestyle.  She vacuums, does her own house keeping.  Does not exercise due to rt hip pain.  She has no SOB or chest pain with any activity.   Her EKG is unchanged from previous.    Past Medical History  Diagnosis Date  . Coronary artery disease   . Hyperlipidemia   .  Hypertension   . Hypothyroidism 10/22/2015  . Osteoporosis 10/22/2015  . Diabetes mellitus ([place]) 10/22/2015    Past Surgical History  Procedure Laterality Date  . Coronary artery bypass graft      11/1994  . Abdominal hysterectomy      1968  . Feet surgery      1991  . Bladder surgery      1972  . Tmj arthroplasty      1979     Current Outpatient Prescriptions  Medication Sig Dispense Refill  . amitriptyline (ELAVIL) 50 MG tablet Take 50 mg by mouth at bedtime.      Marland Kitchen aspirin 325 MG tablet Take 325 mg by mouth daily.      Marland Kitchen atorvastatin (LIPITOR) 40 MG tablet Take 40 mg by mouth daily.      Marland Kitchen CALCIUM PO Take 1,800 mg by mouth daily.      . fish oil-omega-3 fatty acids 1000 MG capsule Take by mouth daily.      Marland Kitchen ibandronate (BONIVA) 150 MG tablet Take 1 tablet by mouth every 30 (thirty) days.  4  . levothyroxine (SYNTHROID, LEVOTHROID) 88 MCG tablet Take 88 mcg by mouth daily.      Marland Kitchen losartan-hydrochlorothiazide (HYZAAR) 100-12.5 MG per tablet Take 1 tablet by mouth daily.      . metFORMIN (GLUCOPHAGE) 500 MG tablet Take 500 mg by mouth  daily.  4  . metoprolol (LOPRESSOR) 50 MG tablet Take 25 mg by mouth 2 (two) times daily.      . nitroGLYCERIN (NITROSTAT) 0.4 MG SL tablet Place 0.4 mg under the tongue every 5 (five) minutes as needed.       No current facility-administered medications for this visit.    Allergies:   Review of patient's allergies indicates no known allergies.    Social History:  The patient  reports that she quit smoking about 21 years ago. She does not have any smokeless tobacco history on file. She reports that she does not drink alcohol or use illicit drugs.   Family History:  The patient's family history includes Alzheimer's disease in her mother; Heart attack in her father.    ROS:  General:no colds or fevers, some weight changes since diagnosis of mass Skin:no rashes or ulcers HEENT:no blurred vision, no congestion CV:see HPI PUL:see HPI GI:no  diarrhea constipation or melena, no indigestion GU:no hematuria, no dysuria MS:no joint pain, no claudication Neuro:no syncope, no lightheadedness Endo:+ diabetes, + thyroid disease  Wt Readings from Last 3 Encounters:  10/24/15 169 lb (76.658 kg)  10/22/15 173 lb (78.472 kg)  10/17/15 173 lb 9.6 oz (78.744 kg)     PHYSICAL EXAM: VS:  BP 148/78 mmHg  Pulse 72  Ht '5\' 2"'$  (1.575 m)  Wt 169 lb (76.658 kg)  BMI 30.90 kg/m2 , BMI Body mass index is 30.9 kg/(m^2). General:Pleasant affect, NAD Skin:Warm and dry, brisk capillary refill HEENT:normocephalic, sclera clear, mucus membranes moist Neck:supple, no JVD, no bruits  Heart:S1S2 RRR without murmur, gallup, rub or click Lungs:clear without rales, rhonchi, or wheezes HRC:BULA, non tender, + BS, do not palpate liver spleen or masses Ext:no lower ext edema, 2+ pedal pulses, 2+ radial pulses Neuro:alert and oriented X 3, MAE, follows commands, + facial symmetry    EKG:  EKG is ordered today. The ekg ordered today demonstrates SR at 75 and T wave inversion in V1=2, though no changes since 2012.     Recent Labs: 10/01/2015: ALT 18; BUN 11.5; Creatinine 0.8; HGB 15.2; Platelets 212; Potassium 3.6; Sodium 135*    Lipid Panel No results found for: CHOL, TRIG, HDL, CHOLHDL, VLDL, LDLCALC, LDLDIRECT     Other studies Reviewed: Additional studies/ records that were reviewed today include: previous notes. cardiolyte from 2005.   ASSESSMENT AND PLAN:  1.  Pre-op exam-  Discussed with Dr. Harrington Challenger, DOD, pt is 21 years out from CABG but has had no further chest pain, or SOB.  She is able to do all her activity without any problems including vacuuming.  This is high risk surgery but her risk is low on [place] Criteria.   No cardiac tests.  Will follow up with Dr. Harrington Challenger in 2 months,  2. CAD with CABG 1996. No angina and no EKG changes since 2012.  3.  DM-2 stable  4. Hyperlipidemia treated.    Current medicines are reviewed with the patient  today.  The patient Has no concerns regarding medicines.  The following changes have been made:  See above Labs/ tests ordered today include:see above  Disposition:   FU:  see above  Lennie Muckle, NP  10/24/2015 2:46 PM    [facility] [place], [place], [place], [place] Phone: [phone]; Fax: [phone]

## 2015-10-24 NOTE — Patient Instructions (Signed)
Medication Instructions:  Your physician recommends that you continue on your current medications as directed. Please refer to the Current Medication list given to you today.   Labwork: None ordered  Testing/Procedures: None ordered  Follow-Up: Your physician recommends that you schedule a follow-up appointment in: 2-3 MONTHS WITH DR. ROSS   Any Other Special Instructions Will Be Listed Below (If Applicable).     If you need a refill on your cardiac medications before your next appointment, please call your pharmacy.

## 2015-10-28 ENCOUNTER — Other Ambulatory Visit: Payer: Self-pay | Admitting: Cardiothoracic Surgery

## 2015-10-28 ENCOUNTER — Ambulatory Visit
Admission: RE | Admit: 2015-10-28 | Discharge: 2015-10-28 | Disposition: A | Discharge status: Home / Self Care | Payer: Medicare Other | Source: Ambulatory Visit | Attending: Cardiothoracic Surgery | Admitting: Cardiothoracic Surgery

## 2015-10-28 DIAGNOSIS — N63 Unspecified lump in unspecified breast: Secondary | ICD-10-CM

## 2015-10-28 DIAGNOSIS — R2232 Localized swelling, mass and lump, left upper limb: Secondary | ICD-10-CM

## 2015-10-31 ENCOUNTER — Encounter: Payer: Self-pay | Admitting: Cardiothoracic Surgery

## 2015-10-31 ENCOUNTER — Ambulatory Visit (INDEPENDENT_AMBULATORY_CARE_PROVIDER_SITE_OTHER): Payer: Medicare Other | Admitting: Cardiothoracic Surgery

## 2015-10-31 VITALS — BP 134/81 | HR 75 | Resp 20 | Ht 62.0 in | Wt 169.0 lb

## 2015-10-31 DIAGNOSIS — R918 Other nonspecific abnormal finding of lung field: Secondary | ICD-10-CM | POA: Diagnosis not present

## 2015-10-31 NOTE — Progress Notes (Signed)
[address]       [place] [address]             [phone]                    Denise Mckenzie [facility] Medical Record #[id number] Date of Birth: 08-10-1946  Referring: Dr Mayme Genta Primary Care: Myriam Jacobson, MD  Chief Complaint:    Chief Complaint  Patient presents with  . Lung Mass    Further discuss consideration of surgical resection, Review PFT's, Cardiac Clearance, Mammogram    History of Present Illness:    Denise Mckenzie 70 y.o. female is seen in the office  today for evaluation  of right lung lesion. Patient has had no symptoms, denies sob or hemoptysis. She has distant history of smoking for 35 yesrs but quit in 1996 after I preformed CABG on her. She had "routine" chest xray thet revealed right upper lung mass, CT scan and PET scan done.   She had a PET scan performed recently. On PET there was noted a intensely hypermetabolic 1.4 cm right upper lobe pulmonary nodule, in keeping with a primary bronchogenic carcinoma and  No hypermetabolic thoracic nodal metastases. No hypermetabolic distant metastatic disease consistent with  clinicial  staging  stage IA (T1a N0 M0).  In addition a  hypermetabolic 1.0 cm soft tissue nodule in the far posterior left breast, which is nonspecific, cannot exclude a primary left breast malignancy. She has a history of questionable lesion in the left breast and this has been evaluated several times by the breast Center and she is currently under close observation   Current Activity/ Functional Status:  Patient is independent with mobility/ambulation, transfers, ADL's, IADL's.   Zubrod Score: At the time of surgery this patient's most appropriate activity status/level should be described as: '[]'$     0    Normal activity, no symptoms '[x]'$     1    Restricted in physical strenuous activity but ambulatory, able to do out light work '[]'$     2    Ambulatory and capable of self care, unable to do work activities, up and about                >50 % of waking hours                              '[]'$     3    Only limited self care, in bed greater than 50% of waking hours '[]'$     4    Completely disabled, no self care, confined to bed or chair '[]'$     5    Moribund   Past Medical History  Diagnosis Date  . Coronary artery disease   . Hyperlipidemia   . Hypertension   . Hypothyroidism 10/22/2015  . Osteoporosis 10/22/2015  . Diabetes mellitus ([place]) 10/22/2015    Past Surgical History  Procedure Laterality Date  . Coronary artery bypass graft      11/1994  . Abdominal hysterectomy      1968  . Feet surgery      1991  . Bladder surgery      1972  . Tmj arthroplasty      1979    Family History  Problem Relation Age of Onset  . Alzheimer's disease Mother   . Heart attack Father     Social History  Social History  . Marital Status: Single    Spouse Name: N/A  . Number of Children: N/A  . Years of Education: N/A   Occupational History  . Not on file.   Social History Main Topics  . Smoking status: Former Smoker    Quit date: 08/17/1994  . Smokeless tobacco: Not on file  . Alcohol Use: No  . Drug Use: No  . Sexual Activity: Not on file   Other Topics Concern  . Not on file   Social History Narrative    History  Smoking status  . Former Smoker  . Quit date: 08/17/1994  Smokeless tobacco  . Not on file    History  Alcohol Use No     No Known Allergies  Current Outpatient Prescriptions  Medication Sig Dispense Refill  . amitriptyline (ELAVIL) 50 MG tablet Take 50 mg by mouth at bedtime.      Marland Kitchen aspirin 325 MG tablet Take 325 mg by mouth daily.      Marland Kitchen atorvastatin (LIPITOR) 40 MG tablet Take 40 mg by mouth daily.      Marland Kitchen CALCIUM PO Take 1,800 mg by mouth daily.      . fish oil-omega-3 fatty acids 1000 MG capsule Take by mouth daily.      Marland Kitchen ibandronate (BONIVA) 150 MG tablet Take 1 tablet by mouth every 30 (thirty) days.  4  . levothyroxine (SYNTHROID, LEVOTHROID) 88 MCG tablet Take 88 mcg  by mouth daily.      Marland Kitchen losartan-hydrochlorothiazide (HYZAAR) 100-12.5 MG per tablet Take 1 tablet by mouth daily.      . metFORMIN (GLUCOPHAGE) 500 MG tablet Take 500 mg by mouth daily.  4  . metoprolol (LOPRESSOR) 50 MG tablet Take 25 mg by mouth 2 (two) times daily.      . nitroGLYCERIN (NITROSTAT) 0.4 MG SL tablet Place 0.4 mg under the tongue every 5 (five) minutes as needed.       No current facility-administered medications for this visit.      Review of Systems:     Cardiac Review of Systems: Y or N  Chest Pain [  n  ]  Resting SOB [n   ] Exertional SOB  Blue.Reese  ]  Orthopnea [  n ]   Pedal Edema Florencio.Farrier   ]    Palpitations [ n ] Syncope  [ n ]   Presyncope [n   ]  General Review of Systems: [Y] = yes [  ]=no Constitional: recent weight change [  ];  Wt loss over the last 3 months [   ] anorexia [  ]; fatigue [  ]; nausea [  ]; night sweats [  ]; fever [  ]; or chills [  ];          Dental: poor dentition[  ]; Last Dentist visit:   Eye : blurred vision [  ]; diplopia [   ]; vision changes [  ];  Amaurosis fugax[  ]; Resp: cough [  ];  wheezing[  ];  hemoptysis[  ]; shortness of breath[  ]; paroxysmal nocturnal dyspnea[  ]; dyspnea on exertion[y  ]; or orthopnea[  ];  GI:  gallstones[  ], vomiting[  ];  dysphagia[  ]; melena[  ];  hematochezia [  ]; heartburn[  ];   Hx of  Colonoscopy[  ]; GU: kidney stones [  ]; hematuria[  ];   dysuria [  ];  nocturia[  ];  history of  obstruction [  ]; urinary frequency [  ]             Skin: rash, swelling[  ];, hair loss[  ];  peripheral edema[  ];  or itching[  ]; Musculosketetal: myalgias[  ];  joint swelling[  ];  joint erythema[  ];  joint pain[  ];  back pain[  ];  Heme/Lymph: bruising[  ];  bleeding[  ];  anemia[  ];  Neuro: TIA[n  ];  headaches[  n];  stroke[  n];  vertigo[  ];  seizures[  ];   paresthesias[  ];  difficulty walking[n  ];  Psych:depression[  ]; anxiety[  ];  Endocrine: diabetes[ n ];  thyroid dysfunction[  ];  Immunizations:  Flu up to date Blue.Reese  ]; Pneumococcal up to date Blue.Reese  ];  Other:  Physical Exam: BP 134/81 mmHg  Pulse 75  Resp 20  Ht '5\' 2"'$  (1.575 m)  Wt 169 lb (76.658 kg)  BMI 30.90 kg/m2  SpO2 96%  PHYSICAL EXAMINATION: General appearance: alert, cooperative, appears stated age and no distress Head: Normocephalic, without obvious abnormality, atraumatic Neck: no adenopathy, no carotid bruit, no JVD, supple, symmetrical, trachea midline and thyroid not enlarged, symmetric, no tenderness/mass/nodules Lymph nodes: Cervical, supraclavicular, and axillary nodes normal. Resp: clear to auscultation bilaterally Back: symmetric, no curvature. ROM normal. No CVA tenderness. Cardio: regular rate and rhythm, S1, S2 normal, no murmur, click, rub or gallop GI: soft, non-tender; bowel sounds normal; no masses,  no organomegaly Extremities: extremities normal, atraumatic, no cyanosis or edema and Homans sign is negative, no sign of DVT Neurologic: Grossly normal  Diagnostic Studies & Laboratory data:     Recent Radiology Findings:  US Breast Ltd Uni Left Inc Axilla  10/28/2015  CLINICAL DATA:  70 year old female who recently had a PET study for a lung mass which showed a hypermetabolic 1 cm soft tissue nodule in the far posterior left breast. EXAM: DIGITAL DIAGNOSTIC LEFT MAMMOGRAM WITH 3D TOMOSYNTHESIS WITH CAD ULTRASOUND LEFT BREAST COMPARISON:  Previous exam(s). ACR Breast Density Category b: There are scattered areas of fibroglandular density. FINDINGS: Far posteriorly in the upper-outer quadrant of the left breast is a 10 mm spiculated mass. No additional masses are seen in the left breast. There are no malignant type microcalcifications. Mammographic images were processed with CAD. On physical exam, I do not palpate a mass in the upper-outer quadrant of the left breast or the axilla. Targeted ultrasound is performed, showing a spiculated hypoechoic mass in the left breast at 2 o'clock 3 cm from the nipple measure  8 x 8 x 10 mm. Sonographic evaluation of the left axilla shows a 1.2 cm lymph node with a prominent cortex. IMPRESSION: Suspicious mass in the 2 o'clock region of the left breast and suspicious left axillary lymph node. RECOMMENDATION: Ultrasound-guided core biopsies of the left breast mass and left axillary lymph node is recommended. This will be scheduled at the patient's convenience. I have discussed the findings and recommendations with the patient. Results were also provided in writing at the conclusion of the visit. If applicable, a reminder letter will be sent to the patient regarding the next appointment. BI-RADS CATEGORY  5: Highly suggestive of malignancy. Electronically Signed   By: Lillia Mountain M.D.   On: 10/28/2015 16:10     Ct Head W Wo Contrast  10/22/2015  CLINICAL DATA:  70 year old female with new diagnosis of right upper lobe lung cancer. Staging. Subsequent encounter. EXAM: CT HEAD WITHOUT  AND WITH CONTRAST TECHNIQUE: Contiguous axial images were obtained from the base of the skull through the vertex without and with intravenous contrast CONTRAST:  49m OMNIPAQUE IOHEXOL 300 MG/ML  SOLN COMPARISON:  PET-CT 10/11/2015 FINDINGS: Visualized paranasal sinuses and mastoids are clear. Scattered small nonspecific lucent areas in the calvarium (e.g. Series 4, image 20) with no destructive osseous lesion identified. Remote postoperative changes to the posterior left zygomatic arch. Visualized orbits and scalp soft tissues are within normal limits. Calcified atherosclerosis at the skull base. Cerebral volume is within normal limits for age. No midline shift, ventriculomegaly, mass effect, evidence of mass lesion, intracranial hemorrhage or evidence of cortically based acute infarction. Gray-white matter differentiation is within normal limits throughout the brain. No abnormal enhancement identified. Major intracranial vascular structures are enhancing. IMPRESSION: 1. Normal CT appearance of the brain. No  metastatic disease to the brain identified. 2. Occasional small indeterminate lucent areas in the calvarium, favor benign in light of no body PET-CT evidence of skeletal metastasis recently, but attention directed on followup. Electronically Signed   By: HGenevie AnnM.D.   On: 10/22/2015 13:16   Nm Pet Image Initial (pi) Skull Base To Thigh  10/11/2015  CLINICAL DATA:  Initial treatment strategy for right upper lobe pulmonary nodule. EXAM: NUCLEAR MEDICINE PET SKULL BASE TO THIGH TECHNIQUE: 8.6 mCi F-18 FDG was injected intravenously. Full-ring PET imaging was performed from the skull base to thigh after the radiotracer. CT data was obtained and used for attenuation correction and anatomic localization. FASTING BLOOD GLUCOSE:  Value: 114 mg/dl COMPARISON:  09/11/2015 chest CT. FINDINGS: NECK No hypermetabolic lymph nodes in the neck. Subcentimeter coarse calcification in the right thyroid lobe. No hypermetabolic thyroid nodules. CHEST There is an intensely hypermetabolic 1.4 x 1.2 cm right upper lobe pulmonary nodule (series 8/image 21) with max SUV 24.8. No hypermetabolic axillary, mediastinal or hilar nodes. Mild centrilobular and paraseptal emphysema with diffuse bronchial wall thickening. No acute consolidative airspace disease. There are at least 4 scattered tiny pulmonary nodules throughout the right lung, largest 4 mm in the anterior right upper lobe (series 8/image 32), which are below PET resolution and are nonspecific, probably benign. Extensive coronary atherosclerosis status post CABG with left internal mammary and ascending aortic bypass grafts. There is a hypermetabolic 1.0 cm soft tissue nodule in the far posterior left breast (series 5/image 76) with max SUV 4.1. ABDOMEN/PELVIS No abnormal hypermetabolic activity within the liver, pancreas, adrenal glands, or spleen. No hypermetabolic lymph nodes in the abdomen or pelvis. Non hypermetabolic 2.5 cm right adrenal mass with density 9 HU, in keeping with  a benign adenoma. Ectasia of the atherosclerotic infrarenal abdominal aorta, maximum diameter 2.5 cm. Extensive subcutaneous soft tissue calcifications in the bilateral gluteal regions, most prominent overlying the right greater trochanter. SKELETON No focal hypermetabolic activity to suggest skeletal metastasis. IMPRESSION: 1. Intensely hypermetabolic 1.4 cm right upper lobe pulmonary nodule, in keeping with a primary bronchogenic carcinoma. 2. No hypermetabolic thoracic nodal metastases. No hypermetabolic distant metastatic disease. PET-CT staging is stage IA (T1a N0 M0). 3. Hypermetabolic 1.0 cm soft tissue nodule in the far posterior left breast, which is nonspecific, cannot exclude a primary left breast malignancy. Recommend further evaluation with diagnostic mammography. 4. Additional findings include extensive coronary atherosclerosis status post CABG, mild emphysema with diffuse bronchial wall thickening suggesting COPD, right adrenal adenoma and ectatic atherosclerotic infrarenal abdominal aorta. Electronically Signed   By: JIlona SorrelM.D.   On: 10/11/2015 13:49     I have  independently reviewed the above radiologic studies.  Recent Lab Findings: Lab Results  Component Value Date   WBC 10.2 10/01/2015   HGB 15.2 10/01/2015   HCT 45.5 10/01/2015   PLT 212 10/01/2015   GLUCOSE 115 10/01/2015   ALT 18 10/01/2015   AST 14 10/01/2015   NA 135* 10/01/2015   K 3.6 10/01/2015   CREATININE 0.8 10/01/2015   BUN 11.5 10/01/2015   CO2 30* 10/01/2015   PFT's FEV1 0,96 47% DLCO 6.19 28% Pulmonary Function Diagnosis: Severe Obstructive Airways Disease Severe Diffusion Defect c/w emphysema  Assessment / Plan:   1/  intensely hypermetabolic 1.4 cm right upper lobe pulmonary nodule, in keeping with a primary bronchogenic carcinoma and  No hypermetabolic thoracic nodal metastases.      No hypermetabolic distant metastatic disease consistent with  clinicial  staging  stage IA (T1a N0 M0) 2/   Hypermetabolic 1.0 cm soft tissue nodule in the far posterior left breast. Suspicious mass in the 2 o'clock region of the left breast and suspicious left axillary lymph node.  Ultrasound-guided core biopsies of the left breast mass and left axillary lymph node is scheduled for monday 3/ History CAD s/p cabg 1996  4/Pulmonary Function Diagnosis: Severe Obstructive Airways Disease Severe Diffusion Defect c/w emphysema   Mammogram confirmed suspicious lesion in the left breast patient is scheduled for needle biopsy on Monday with path results available on Tuesday or Wednesday next week. Patient's pulmonary function studies actually significantly worse than anticipated with severe obstructive airway disease and severe diffusion capacity consistent with emphysema FEV1 is 47% of predicted and DLCO 28%, markedly increasing the risk of lobectomy. I reviewed pulmonary function studies with the patient. We'll proceed with the needle biopsy of the breast obtain this tissue diagnosis I will see her next week and we will decide on proceeding with limited lung resection versus CT directed needle biopsy or navigation bronchoscopy to obtain a tissue diagnosis and consider stereotactic radiotherapy.  Grace Isaac MD      [address] [place] [address] Office [phone]   Beeper [phone]  10/31/2015 11:41 AM

## 2015-11-04 ENCOUNTER — Ambulatory Visit
Admission: RE | Admit: 2015-11-04 | Discharge: 2015-11-04 | Disposition: A | Discharge status: Home / Self Care | Payer: Medicare Other | Source: Ambulatory Visit | Attending: Cardiothoracic Surgery | Admitting: Cardiothoracic Surgery

## 2015-11-04 ENCOUNTER — Other Ambulatory Visit: Payer: Self-pay | Admitting: Cardiothoracic Surgery

## 2015-11-04 DIAGNOSIS — N63 Unspecified lump in unspecified breast: Secondary | ICD-10-CM

## 2015-11-04 DIAGNOSIS — R2232 Localized swelling, mass and lump, left upper limb: Secondary | ICD-10-CM

## 2015-11-04 HISTORY — PX: BREAST BIOPSY: SHX20

## 2015-11-07 ENCOUNTER — Other Ambulatory Visit: Payer: Self-pay | Admitting: *Deleted

## 2015-11-07 ENCOUNTER — Encounter: Payer: Self-pay | Admitting: Cardiothoracic Surgery

## 2015-11-07 ENCOUNTER — Ambulatory Visit (INDEPENDENT_AMBULATORY_CARE_PROVIDER_SITE_OTHER): Payer: Medicare Other | Admitting: Cardiothoracic Surgery

## 2015-11-07 VITALS — BP 118/76 | HR 76 | Resp 20 | Ht 62.0 in | Wt 169.0 lb

## 2015-11-07 DIAGNOSIS — C50919 Malignant neoplasm of unspecified site of unspecified female breast: Secondary | ICD-10-CM | POA: Insufficient documentation

## 2015-11-07 DIAGNOSIS — Z85118 Personal history of other malignant neoplasm of bronchus and lung: Secondary | ICD-10-CM | POA: Diagnosis not present

## 2015-11-07 DIAGNOSIS — R918 Other nonspecific abnormal finding of lung field: Secondary | ICD-10-CM | POA: Diagnosis not present

## 2015-11-07 NOTE — Progress Notes (Signed)
[address]       [place] [address]             [phone]                    Danitza S [facility] Record #[id number] Date of Birth: 1946-06-20  Referring: Dr Mayme Genta Primary Care: Myriam Jacobson, MD  Chief Complaint:    Chief Complaint  Patient presents with  . Routine Post Op    f/u to discuss ULTRASOUND GUIDED LEFT BREAST CORE NEEDLE BIOPSY on 11/04/15  . Lung Mass    History of Present Illness:    Denise Mckenzie 70 y.o. female is seen in the office   for evaluation  of right lung lesion. Patient has had no symptoms, denies sob or hemoptysis. She has distant history of smoking for 35 yesrs but quit in 1996 after I preformed CABG on her. She had "routine" chest xray thet revealed right upper lung mass, CT scan and PET scan done.   She had a PET scan performed recently. On PET there was noted a intensely hypermetabolic 1.4 cm right upper lobe pulmonary nodule, in keeping with a primary bronchogenic carcinoma and  No hypermetabolic thoracic nodal metastases. No hypermetabolic distant metastatic disease consistent with  clinicial  staging  stage IA (T1a N0 M0).  In addition a  hypermetabolic 1.0 cm soft tissue nodule in the far posterior left breast. She has a history of questionable lesion in the left breast and this has been evaluated several times by the breast Center and she is currently under close observation. Since seen last week we've arranged for further imaging of her breast and ultrasound-guided needle biopsy of the left breast. 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   Current Activity/ Functional Status:  Patient is independent with mobility/ambulation, transfers, ADL's, IADL's.   Zubrod Score: At the time of surgery this patient's most appropriate activity status/level should be described as: '[]'$     0    Normal activity, no symptoms '[x]'$     1    Restricted in physical  strenuous activity but ambulatory, able to do out light work '[]'$     2    Ambulatory and capable of self care, unable to do work activities, up and about               >50 % of waking hours                              '[]'$     3    Only limited self care, in bed greater than 50% of waking hours '[]'$     4    Completely disabled, no self care, confined to bed or chair '[]'$     5    Moribund   Past Medical History  Diagnosis Date  . Coronary artery disease   . Hyperlipidemia   . Hypertension   . Hypothyroidism 10/22/2015  . Osteoporosis 10/22/2015  . Diabetes mellitus ([place]) 10/22/2015    Past Surgical History  Procedure Laterality Date  . Coronary artery bypass graft      11/1994  . Abdominal hysterectomy      1968  . Feet surgery      1991  . Bladder surgery      1972  . Tmj arthroplasty  1979    Family History  Problem Relation Age of Onset  . Alzheimer's disease Mother   . Heart attack Father     Social History   Social History  . Marital Status: Single    Spouse Name: N/A  . Number of Children: N/A  . Years of Education: N/A   Occupational History  . Not on file.   Social History Main Topics  . Smoking status: Former Smoker    Quit date: 08/17/1994  . Smokeless tobacco: Not on file  . Alcohol Use: No  . Drug Use: No  . Sexual Activity: Not on file   Other Topics Concern  . Not on file   Social History Narrative    History  Smoking status  . Former Smoker  . Quit date: 08/17/1994  Smokeless tobacco  . Not on file    History  Alcohol Use No     No Known Allergies  Current Outpatient Prescriptions  Medication Sig Dispense Refill  . amitriptyline (ELAVIL) 50 MG tablet Take 50 mg by mouth at bedtime.      Marland Kitchen aspirin 325 MG tablet Take 325 mg by mouth daily.      Marland Kitchen atorvastatin (LIPITOR) 40 MG tablet Take 40 mg by mouth daily.      Marland Kitchen CALCIUM PO Take 1,800 mg by mouth daily.      . fish oil-omega-3 fatty acids 1000 MG capsule Take by mouth daily.      Marland Kitchen  ibandronate (BONIVA) 150 MG tablet Take 1 tablet by mouth every 30 (thirty) days.  4  . levothyroxine (SYNTHROID, LEVOTHROID) 88 MCG tablet Take 88 mcg by mouth daily.      Marland Kitchen losartan-hydrochlorothiazide (HYZAAR) 100-12.5 MG per tablet Take 1 tablet by mouth daily.      . metFORMIN (GLUCOPHAGE) 500 MG tablet Take 500 mg by mouth daily.  4  . metoprolol (LOPRESSOR) 50 MG tablet Take 25 mg by mouth 2 (two) times daily.      . nitroGLYCERIN (NITROSTAT) 0.4 MG SL tablet Place 0.4 mg under the tongue every 5 (five) minutes as needed.       No current facility-administered medications for this visit.      Review of Systems:     Cardiac Review of Systems: Y or N  Chest Pain [  n  ]  Resting SOB [n   ] Exertional SOB  Blue.Reese  ]  Orthopnea [  n ]   Pedal Edema Florencio.Farrier   ]    Palpitations [ n ] Syncope  [ n ]   Presyncope [n   ]  General Review of Systems: [Y] = yes [  ]=no Constitional: recent weight change [  ];  Wt loss over the last 3 months [   ] anorexia [  ]; fatigue [  ]; nausea [  ]; night sweats [  ]; fever [  ]; or chills [  ];          Dental: poor dentition[  ]; Last Dentist visit:   Eye : blurred vision [  ]; diplopia [   ]; vision changes [  ];  Amaurosis fugax[  ]; Resp: cough [  ];  wheezing[  ];  hemoptysis[  ]; shortness of breath[  ]; paroxysmal nocturnal dyspnea[  ]; dyspnea on exertion[y  ]; or orthopnea[  ];  GI:  gallstones[  ], vomiting[  ];  dysphagia[  ]; melena[  ];  hematochezia [  ]; heartburn[  ];  Hx of  Colonoscopy[  ]; GU: kidney stones [  ]; hematuria[  ];   dysuria [  ];  nocturia[  ];  history of     obstruction [  ]; urinary frequency [  ]             Skin: rash, swelling[  ];, hair loss[  ];  peripheral edema[  ];  or itching[  ]; Musculosketetal: myalgias[  ];  joint swelling[  ];  joint erythema[  ];  joint pain[  ];  back pain[  ];  Heme/Lymph: bruising[  ];  bleeding[  ];  anemia[  ];  Neuro: TIA[n  ];  headaches[  n];  stroke[  n];  vertigo[  ];  seizures[  ];    paresthesias[  ];  difficulty walking[n  ];  Psych:depression[  ]; anxiety[  ];  Endocrine: diabetes[ n ];  thyroid dysfunction[  ];  Immunizations: Flu up to date Blue.Reese  ]; Pneumococcal up to date Blue.Reese  ];  Other:  Physical Exam: BP 118/76 mmHg  Pulse 76  Resp 20  Ht '5\' 2"'$  (1.575 m)  Wt 169 lb (76.658 kg)  BMI 30.90 kg/m2  SpO2 96%  PHYSICAL EXAMINATION: General appearance: alert, cooperative, appears stated age and no distress Head: Normocephalic, without obvious abnormality, atraumatic Neck: no adenopathy, no carotid bruit, no JVD, supple, symmetrical, trachea midline and thyroid not enlarged, symmetric, no tenderness/mass/nodules Lymph nodes: Cervical, supraclavicular, and axillary nodes normal. Resp: clear to auscultation bilaterally Back: symmetric, no curvature. ROM normal. No CVA tenderness. Cardio: regular rate and rhythm, S1, S2 normal, no murmur, click, rub or gallop GI: soft, non-tender; bowel sounds normal; no masses,  no organomegaly Extremities: extremities normal, atraumatic, no cyanosis or edema and Homans sign is negative, no sign of DVT Neurologic: Grossly normal  Diagnostic Studies & Laboratory data:     Recent Radiology Findings:  US Breast Ltd Uni Left Inc Axilla  10/28/2015  CLINICAL DATA:  70 year old female who recently had a PET study for a lung mass which showed a hypermetabolic 1 cm soft tissue nodule in the far posterior left breast. EXAM: DIGITAL DIAGNOSTIC LEFT MAMMOGRAM WITH 3D TOMOSYNTHESIS WITH CAD ULTRASOUND LEFT BREAST COMPARISON:  Previous exam(s). ACR Breast Density Category b: There are scattered areas of fibroglandular density. FINDINGS: Far posteriorly in the upper-outer quadrant of the left breast is a 10 mm spiculated mass. No additional masses are seen in the left breast. There are no malignant type microcalcifications. Mammographic images were processed with CAD. On physical exam, I do not palpate a mass in the upper-outer quadrant of the left  breast or the axilla. Targeted ultrasound is performed, showing a spiculated hypoechoic mass in the left breast at 2 o'clock 3 cm from the nipple measure 8 x 8 x 10 mm. Sonographic evaluation of the left axilla shows a 1.2 cm lymph node with a prominent cortex. IMPRESSION: Suspicious mass in the 2 o'clock region of the left breast and suspicious left axillary lymph node. RECOMMENDATION: Ultrasound-guided core biopsies of the left breast mass and left axillary lymph node is recommended. This will be scheduled at the patient's convenience. I have discussed the findings and recommendations with the patient. Results were also provided in writing at the conclusion of the visit. If applicable, a reminder letter will be sent to the patient regarding the next appointment. BI-RADS CATEGORY  5: Highly suggestive of malignancy. Electronically Signed   By: Lillia Mountain M.D.   On: 10/28/2015 16:10  Ct Head W Wo Contrast  10/22/2015  CLINICAL DATA:  70 year old female with new diagnosis of right upper lobe lung cancer. Staging. Subsequent encounter. EXAM: CT HEAD WITHOUT AND WITH CONTRAST TECHNIQUE: Contiguous axial images were obtained from the base of the skull through the vertex without and with intravenous contrast CONTRAST:  78m OMNIPAQUE IOHEXOL 300 MG/ML  SOLN COMPARISON:  PET-CT 10/11/2015 FINDINGS: Visualized paranasal sinuses and mastoids are clear. Scattered small nonspecific lucent areas in the calvarium (e.g. Series 4, image 20) with no destructive osseous lesion identified. Remote postoperative changes to the posterior left zygomatic arch. Visualized orbits and scalp soft tissues are within normal limits. Calcified atherosclerosis at the skull base. Cerebral volume is within normal limits for age. No midline shift, ventriculomegaly, mass effect, evidence of mass lesion, intracranial hemorrhage or evidence of cortically based acute infarction. Gray-white matter differentiation is within normal limits throughout  the brain. No abnormal enhancement identified. Major intracranial vascular structures are enhancing. IMPRESSION: 1. Normal CT appearance of the brain. No metastatic disease to the brain identified. 2. Occasional small indeterminate lucent areas in the calvarium, favor benign in light of no body PET-CT evidence of skeletal metastasis recently, but attention directed on followup. Electronically Signed   By: HGenevie AnnM.D.   On: 10/22/2015 13:16   Nm Pet Image Initial (pi) Skull Base To Thigh  10/11/2015  CLINICAL DATA:  Initial treatment strategy for right upper lobe pulmonary nodule. EXAM: NUCLEAR MEDICINE PET SKULL BASE TO THIGH TECHNIQUE: 8.6 mCi F-18 FDG was injected intravenously. Full-ring PET imaging was performed from the skull base to thigh after the radiotracer. CT data was obtained and used for attenuation correction and anatomic localization. FASTING BLOOD GLUCOSE:  Value: 114 mg/dl COMPARISON:  09/11/2015 chest CT. FINDINGS: NECK No hypermetabolic lymph nodes in the neck. Subcentimeter coarse calcification in the right thyroid lobe. No hypermetabolic thyroid nodules. CHEST There is an intensely hypermetabolic 1.4 x 1.2 cm right upper lobe pulmonary nodule (series 8/image 21) with max SUV 24.8. No hypermetabolic axillary, mediastinal or hilar nodes. Mild centrilobular and paraseptal emphysema with diffuse bronchial wall thickening. No acute consolidative airspace disease. There are at least 4 scattered tiny pulmonary nodules throughout the right lung, largest 4 mm in the anterior right upper lobe (series 8/image 32), which are below PET resolution and are nonspecific, probably benign. Extensive coronary atherosclerosis status post CABG with left internal mammary and ascending aortic bypass grafts. There is a hypermetabolic 1.0 cm soft tissue nodule in the far posterior left breast (series 5/image 76) with max SUV 4.1. ABDOMEN/PELVIS No abnormal hypermetabolic activity within the liver, pancreas, adrenal  glands, or spleen. No hypermetabolic lymph nodes in the abdomen or pelvis. Non hypermetabolic 2.5 cm right adrenal mass with density 9 HU, in keeping with a benign adenoma. Ectasia of the atherosclerotic infrarenal abdominal aorta, maximum diameter 2.5 cm. Extensive subcutaneous soft tissue calcifications in the bilateral gluteal regions, most prominent overlying the right greater trochanter. SKELETON No focal hypermetabolic activity to suggest skeletal metastasis. IMPRESSION: 1. Intensely hypermetabolic 1.4 cm right upper lobe pulmonary nodule, in keeping with a primary bronchogenic carcinoma. 2. No hypermetabolic thoracic nodal metastases. No hypermetabolic distant metastatic disease. PET-CT staging is stage IA (T1a N0 M0). 3. Hypermetabolic 1.0 cm soft tissue nodule in the far posterior left breast, which is nonspecific, cannot exclude a primary left breast malignancy. Recommend further evaluation with diagnostic mammography. 4. Additional findings include extensive coronary atherosclerosis status post CABG, mild emphysema with diffuse bronchial wall thickening suggesting COPD,  right adrenal adenoma and ectatic atherosclerotic infrarenal abdominal aorta. Electronically Signed   By: Ilona Sorrel M.D.   On: 10/11/2015 13:49     I have independently reviewed the above radiologic studies.  Recent Lab Findings: Lab Results  Component Value Date   WBC 10.2 10/01/2015   HGB 15.2 10/01/2015   HCT 45.5 10/01/2015   PLT 212 10/01/2015   GLUCOSE 115 10/01/2015   ALT 18 10/01/2015   AST 14 10/01/2015   NA 135* 10/01/2015   K 3.6 10/01/2015   CREATININE 0.8 10/01/2015   BUN 11.5 10/01/2015   CO2 30* 10/01/2015   PFT's FEV1 0,96 47% DLCO 6.19 28% Pulmonary Function Diagnosis: Severe Obstructive Airways Disease Severe Diffusion Defect c/w emphysema  Assessment / Plan:   1/  intensely hypermetabolic 1.4 cm right upper lobe pulmonary nodule, in keeping with a primary bronchogenic carcinoma and  No  hypermetabolic thoracic nodal metastases.      No hypermetabolic distant metastatic disease consistent with  clinicial  staging  stage IA (T1a N0 M0)- the case was discussed and the multi disciplinary thoracic oncology clinic. Patient's pulmonary function studies actually significantly worse than anticipated with severe obstructive airway disease and severe diffusion capacity consistent with emphysema FEV1 is 47% of predicted and DLCO 28%,With the patient's limited pulmonary reserve I recommended to her that we initially proceed with bronchoscopy and ENB for navigational bronchoscopy biopsy of the right upper lobe lung lesion. The films have been reviewed by radiation oncology and she would be a suitable candidate for stereotactic radio therapy treatment of the right upper lobe lung mass.  We will plan the navigation bronchoscopy Wednesday, March 29.  2/  Hypermetabolic 1.0 cm soft tissue nodule in the far posterior left breast. Suspicious mass in the 2 o'clock region of the left breast and suspicious left axillary lymph node.  Ultrasound-guided core biopsies of the left breast mass and left axillary lymph node has been done. The patient was referred to [facility] by the breast center, however the patient would prefer to be seen in [facility] we've made her an appointment to see general surgeon tomorrow.  3/ History CAD s/p cabg 1996- patient has recently seen Dr. Radford Pax and been cleared for surgery without further testing   4/Pulmonary Function Diagnosis: Severe Obstructive Airways Disease Severe Diffusion Defect c/w emphysema     markedly increasing the risk of lobectomy. I reviewed pulmonary function studies with the patient. We'll proceed with the needle biopsy of the breast obtain this tissue diagnosis I will see her next week and we will decide on proceeding with limited lung resection versus CT directed needle biopsy or navigation bronchoscopy to obtain a tissue diagnosis and consider  stereotactic radiotherapy.  Grace Isaac MD      [address] [place], [address] Office [phone]   Beeper [phone]  11/07/2015 9:34 AM

## 2015-11-08 ENCOUNTER — Ambulatory Visit: Payer: Self-pay | Admitting: Surgery

## 2015-11-08 DIAGNOSIS — C50912 Malignant neoplasm of unspecified site of left female breast: Secondary | ICD-10-CM

## 2015-11-09 ENCOUNTER — Ambulatory Visit: Payer: Self-pay | Admitting: Surgery

## 2015-11-09 NOTE — H&P (Signed)
History of Present Illness Denise Mckenzie. Denise Wafer MD; 11/09/2015 10:01 AM) Patient words: breast eval.  The patient is a 70 year old female who presents with breast cancer. PCP - Dr. Sharyn Lull Collins/ Dr. Lorene Dy Cardiothoracic - Dr. Ceasar Mons Oncology - Dr. Julien Nordmann  Reason for consultation - new left breast cancer  This is a 70 yo female who is being worked up for a right upper lung mass noted on chest x-ray. CT/PET showed an intensely hypermetabolic 1.4 cm right upper lobe pulmonary nodule with no sign of thoracic nodal disease. These findings are consistent with stage 1A primary bronchogenic carcinoma. She is scheduled to under go biopsy of this lesion on 11/13/15. The PET/CT also showed an incidental finding of a hypermetabolic 1.0 cm mass in her posterior left breast. There have been some questionable findings in this area on recent mammograms and she has been followed closely with q6 month studies. She underwent mammogram and ultrasound, as well as core biopsy, which revealed an invasive ductal carcinoma. The lymph node core biopsy was negative.  The patient denies any family history or breast problems. The only family history was a maternal aunt who was diagnosed with breast cancer in her 3's Menarche - 56 First pregnancy - 18 Breastfeed - no Oral contraceptives - 7 months - became pregnant on OCP No other hormones  CLINICAL DATA: Initial treatment strategy for right upper lobe pulmonary nodule.  EXAM: NUCLEAR MEDICINE PET SKULL BASE TO THIGH  TECHNIQUE: 8.6 mCi F-18 FDG was injected intravenously. Full-ring PET imaging was performed from the skull base to thigh after the radiotracer. CT data was obtained and used for attenuation correction and anatomic localization.  FASTING BLOOD GLUCOSE: Value: 114 mg/dl  COMPARISON: 09/11/2015 chest CT.  FINDINGS: NECK  No hypermetabolic lymph nodes in the neck. Subcentimeter coarse calcification in the right thyroid  lobe. No hypermetabolic thyroid nodules.  CHEST  There is an intensely hypermetabolic 1.4 x 1.2 cm right upper lobe pulmonary nodule (series 8/image 21) with max SUV 24.8.  No hypermetabolic axillary, mediastinal or hilar nodes. Mild centrilobular and paraseptal emphysema with diffuse bronchial wall thickening. No acute consolidative airspace disease. There are at least 4 scattered tiny pulmonary nodules throughout the right lung, largest 4 mm in the anterior right upper lobe (series 8/image 32), which are below PET resolution and are nonspecific, probably benign. Extensive coronary atherosclerosis status post CABG with left internal mammary and ascending aortic bypass grafts.  There is a hypermetabolic 1.0 cm soft tissue nodule in the far posterior left breast (series 5/image 76) with max SUV 4.1.  ABDOMEN/PELVIS  No abnormal hypermetabolic activity within the liver, pancreas, adrenal glands, or spleen. No hypermetabolic lymph nodes in the abdomen or pelvis. Non hypermetabolic 2.5 cm right adrenal mass with density 9 HU, in keeping with a benign adenoma. Ectasia of the atherosclerotic infrarenal abdominal aorta, maximum diameter 2.5 cm. Extensive subcutaneous soft tissue calcifications in the bilateral gluteal regions, most prominent overlying the right greater trochanter.  SKELETON  No focal hypermetabolic activity to suggest skeletal metastasis.  IMPRESSION: 1. Intensely hypermetabolic 1.4 cm right upper lobe pulmonary nodule, in keeping with a primary bronchogenic carcinoma. 2. No hypermetabolic thoracic nodal metastases. No hypermetabolic distant metastatic disease. PET-CT staging is stage IA (T1a N0 M0). 3. Hypermetabolic 1.0 cm soft tissue nodule in the far posterior left breast, which is nonspecific, cannot exclude a primary left breast malignancy. Recommend further evaluation with diagnostic mammography. 4. Additional findings include extensive coronary  atherosclerosis status post  CABG, mild emphysema with diffuse bronchial wall thickening suggesting COPD, right adrenal adenoma and ectatic atherosclerotic infrarenal abdominal aorta.   Electronically Signed By: Ilona Sorrel M.D. On: 10/11/2015 13:49  CLINICAL DATA: 70 year old female who recently had a PET study for a lung mass which showed a hypermetabolic 1 cm soft tissue nodule in the far posterior left breast.  EXAM: DIGITAL DIAGNOSTIC LEFT MAMMOGRAM WITH 3D TOMOSYNTHESIS WITH CAD  ULTRASOUND LEFT BREAST  COMPARISON: Previous exam(s).  ACR Breast Density Category b: There are scattered areas of fibroglandular density.  FINDINGS: Far posteriorly in the upper-outer quadrant of the left breast is a 10 mm spiculated mass. No additional masses are seen in the left breast. There are no malignant type microcalcifications.  Mammographic images were processed with CAD.  On physical exam, I do not palpate a mass in the upper-outer quadrant of the left breast or the axilla.  Targeted ultrasound is performed, showing a spiculated hypoechoic mass in the left breast at 2 o'clock 3 cm from the nipple measure 8 x 8 x 10 mm. Sonographic evaluation of the left axilla shows a 1.2 cm lymph node with a prominent cortex.  IMPRESSION: Suspicious mass in the 2 o'clock region of the left breast and suspicious left axillary lymph node.  RECOMMENDATION: Ultrasound-guided core biopsies of the left breast mass and left axillary lymph node is recommended. This will be scheduled at the patient's convenience.  I have discussed the findings and recommendations with the patient. Results were also provided in writing at the conclusion of the visit. If applicable, a reminder letter will be sent to the patient regarding the next appointment.  BI-RADS CATEGORY 5: Highly suggestive of malignancy.   Electronically Signed By: Lillia Mountain M.D. On: 10/28/2015 16:10  CLINICAL DATA:  70 year old female with history of lung cancer and a mass in the left breast identified on a PET-CT presents for ultrasound-guided biopsy of this left breast mass and a left axillary lymph nodes.  EXAM: ULTRASOUND GUIDED LEFT BREAST CORE NEEDLE BIOPSY  COMPARISON: Previous exam(s).  FINDINGS: I met with the patient and we discussed the procedure of ultrasound-guided biopsy, including benefits and alternatives. We discussed the high likelihood of a successful procedure. We discussed the risks of the procedure, including infection, bleeding, tissue injury, clip migration, and inadequate sampling. Informed written consent was given. The usual time-out protocol was performed immediately prior to the procedure.  Using sterile technique and 1% Lidocaine as local anesthetic, under direct ultrasound visualization, a 14 Gauge spring-loaded device was used to perform biopsy of a mass in the upper-outer quadrant of the left breast using a lateral approach. At the conclusion of the procedure a ribbon shaped tissue marker clip was deployed into the biopsy cavity.  Using sterile technique and 1% Lidocaine as local anesthetic, under direct ultrasound visualization, a 14 gauge spring-loaded device was used to perform biopsy of left axillary lymph node using a lateral approach. At the conclusion of the procedure a HydroMARK tissue marker clip was deployed into the biopsy cavity. Follow up 2 view mammogram was performed and dictated separately.  IMPRESSION: Ultrasound guided biopsy of a mass in the upper outer quadrant of the left breast and a left axillary lymph node. No apparent complications.   Electronically Signed By: Ammie Ferrier M.D. On: 11/04/2015 15:26  Diagnosis 1. Breast, left, needle core biopsy, upper outer quadrant - INVASIVE DUCTAL CARCINOMA. - SEE COMMENT. 2. Lymph node, needle/core biopsy, left axilla - THERE IS NO EVIDENCE OF CARCINOMA IN 1 OF  1 LYMPH NODE  (0/1). - SEE COMMENT. Microscopic Comment 1. and 2. The carcinoma in part 1 appears grade 2. A breast prognostic profile will be performed and the results reported separately. The results were called to [facility] on 11/05/15. (JBK:gt, 11/05/15) Enid Cutter MD Pathologist, Electronic Signature (Case signed 11/05/2015)  ADDITIONAL INFORMATION: 1. PROGNOSTIC INDICATORS Results: IMMUNOHISTOCHEMICAL AND MORPHOMETRIC ANALYSIS PERFORMED MANUALLY Estrogen Receptor: 100%, POSITIVE, STRONG STAINING INTENSITY Progesterone Receptor: 100%, POSITIVE, STRONG STAINING INTENSITY Proliferation Marker Ki67: 6% REFERENCE RANGE ESTROGEN RECEPTOR NEGATIVE 0% POSITIVE =>1% REFERENCE RANGE PROGESTERONE RECEPTOR NEGATIVE 0% POSITIVE =>1% All controls stained appropriately Enid Cutter MD Pathologist, Electronic Signature ( Signed 11/07/2015) 1. FLUORESCENCE IN-SITU HYBRIDIZATION Results: HER2 - NEGATIVE RATIO OF HER2/CEP17 SIGNALS 1.32 AVERAGE HER2 COPY NUMBER PER CELL 3.10  Specimen      Other Problems Davy Pique Bynum, CMA; 11/08/2015 11:15 AM) Anxiety Disorder Back Pain Depression Diabetes Mellitus Hypercholesterolemia Lump In Breast Myocardial infarction Thyroid Disease  Past Surgical History Marjean Donna, CMA; 11/08/2015 11:15 AM) Breast Biopsy Left. Colon Polyp Removal - Colonoscopy Coronary Artery Bypass Graft Foot Surgery Left. Hysterectomy (not due to cancer) - Complete Oral Surgery  Diagnostic Studies History Marjean Donna, CMA; 11/08/2015 11:15 AM) Colonoscopy 5-10 years ago Mammogram within last year Pap Smear 1-5 years ago  Medication History Marjean Donna, CMA; 11/08/2015 11:16 AM) Amitriptyline HCl (50MG Tablet, Oral) Active. Atorvastatin Calcium (40MG Tablet, Oral) Active. Levothyroxine Sodium (88MCG Tablet, Oral) Active. Losartan Potassium-HCTZ (100-12.5MG Tablet, Oral) Active. MetFORMIN HCl (500MG Tablet, Oral)  Active. Metoprolol Tartrate (50MG Tablet, Oral) Active. Nitroglycerin (0.4MG Tab Sublingual, Sublingual) Active. Boniva (150MG Tablet, Oral) Active. Aspirin (81MG Tablet Chewable, Oral) Active. Medications Reconciled  Social History Marjean Donna, CMA; 11/08/2015 11:15 AM) Caffeine use Coffee, Tea. No alcohol use No drug use Tobacco use Former smoker.  Family History Marjean Donna, [place]; 11/08/2015 11:15 AM) Alcohol Abuse Brother. Arthritis Mother. Diabetes Mellitus Father. Heart Disease Father. Heart disease in female family member before age 21 Respiratory Condition Sister. Thyroid problems Mother, Sister.  Pregnancy / Birth History Marjean Donna, [place]; 11/08/2015 11:15 AM) Age at menarche 71 years. Age of menopause 35-50 Gravida 2 Irregular periods Maternal age 90-20 Para 2     Review of Systems ([place]; 11/08/2015 11:15 AM) General Not Present- Appetite Loss, Chills, Fatigue, Fever, Night Sweats, Weight Gain and Weight Loss. Skin Not Present- Change in Wart/Mole, Dryness, Hives, Jaundice, New Lesions, Non-Healing Wounds, Rash and Ulcer. HEENT Present- Wears glasses/contact lenses. Not Present- Earache, Hearing Loss, Hoarseness, Nose Bleed, Oral Ulcers, Ringing in the Ears, Seasonal Allergies, Sinus Pain, Sore Throat, Visual Disturbances and Yellow Eyes. Respiratory Not Present- Bloody sputum, Chronic Cough, Difficulty Breathing, Snoring and Wheezing. Breast Present- Breast Mass. Not Present- Breast Pain, Nipple Discharge and Skin Changes. Cardiovascular Not Present- Chest Pain, Difficulty Breathing Lying Down, Leg Cramps, Palpitations, Rapid Heart Rate, Shortness of Breath and Swelling of Extremities. Gastrointestinal Not Present- Abdominal Pain, Bloating, Bloody Stool, Change in Bowel Habits, Chronic diarrhea, Constipation, Difficulty Swallowing, Excessive gas, Gets full quickly at meals, Hemorrhoids, Indigestion, Nausea, Rectal Pain and  Vomiting. Female Genitourinary Not Present- Frequency, Nocturia, Painful Urination, Pelvic Pain and Urgency. Musculoskeletal Present- Back Pain. Not Present- Joint Pain, Joint Stiffness, Muscle Pain, Muscle Weakness and Swelling of Extremities. Neurological Not Present- Decreased Memory, Fainting, Headaches, Numbness, Seizures, Tingling, Tremor, Trouble walking and Weakness. Psychiatric Present- Depression. Not Present- Anxiety, Bipolar, Change in Sleep Pattern, Fearful and Frequent crying. Endocrine Present- New Diabetes. Not Present- Cold Intolerance, Excessive Hunger, Hair  Changes, Heat Intolerance and Hot flashes. Hematology Not Present- Easy Bruising, Excessive bleeding, Gland problems, HIV and Persistent Infections.  Vitals (Sonya Bynum CMA; 11/08/2015 11:15 AM) 11/08/2015 11:15 AM Weight: 168 lb Height: 62in Body Surface Area: 1.77 m Body Mass Index: 30.73 kg/m  Temp.: 77F(Temporal)  Pulse: 76 (Regular)  BP: 136/78 (Sitting, Left Arm, Standard)      Physical Exam Rodman Key K. Kimyah Frein MD; 11/09/2015 10:03 AM)  The physical exam findings are as follows: Note:WDWN in NAD HEENT: EOMI, sclera anicteric Neck: No masses, no thyromegaly; no palpable lymphadenopathy Lungs: CTA bilaterally; normal respiratory effort Breasts: symmetric except for some left lateral bruising after biopsy No palpable masses; no palpable lympadenopathy No nipple retraction or discharge CV: Regular rate and rhythm; no murmurs Abd: +bowel sounds, soft, non-tender, no masses Ext: Well-perfused; no edema Skin: Warm, dry; no sign of jaundice    Assessment & Plan Rodman Key K. Takari Lundahl MD; 11/09/2015 10:06 AM)  INVASIVE DUCTAL CARCINOMA OF BREAST, LEFT (C50.912)  Current Plans We discussed surgical options for her breast cancer. We discussed mastectomy vs. breast conserving therapy. We discussed the likely need for radiation therapy after breast conserving therapy. She may be receiving radiation for  her right lung cancer. Much of this decision making is depending on her lung biopsy and the subsequent treatment plan. We discussed the prognostic panel, which actually appears fairly favorable. We will discuss this further with her after her lung biopsy.  Schedule for Surgery - Left radioactive seed localized lumpectomy/ left axillary sentinel lymph node biopsy. The surgical procedure has been discussed with the patient. Potential risks, benefits, alternative treatments, and expected outcomes have been explained. All of the patient's questions at this time have been answered. The likelihood of reaching the patient's treatment goal is good. The patient understand the proposed surgical procedure and wishes to proceed.  We will await the results of her lung biopsy prior to scheduling surgery.  Pt Education - [facility] Breast Cancer Information Given - [facility] "Breast Journey" Package  Denise Mckenzie. Georgette Dover, MD, [facility] Surgery  General/ Trauma Surgery  11/09/2015 10:07 AM

## 2015-11-11 ENCOUNTER — Ambulatory Visit (HOSPITAL_COMMUNITY)
Admission: RE | Admit: 2015-11-11 | Discharge: 2015-11-11 | Disposition: A | Discharge status: Home / Self Care | Payer: Medicare Other | Source: Ambulatory Visit | Attending: Cardiothoracic Surgery | Admitting: Cardiothoracic Surgery

## 2015-11-11 ENCOUNTER — Encounter (HOSPITAL_COMMUNITY)
Admission: RE | Admit: 2015-11-11 | Discharge: 2015-11-11 | Disposition: A | Discharge status: Home / Self Care | Payer: Medicare Other | Source: Ambulatory Visit | Attending: Cardiothoracic Surgery | Admitting: Cardiothoracic Surgery

## 2015-11-11 ENCOUNTER — Other Ambulatory Visit: Payer: Self-pay | Admitting: *Deleted

## 2015-11-11 ENCOUNTER — Encounter (HOSPITAL_COMMUNITY): Payer: Self-pay

## 2015-11-11 VITALS — BP 141/86 | HR 84 | Temp 98.1°F | Resp 20 | Ht 62.0 in | Wt 167.5 lb

## 2015-11-11 DIAGNOSIS — Z951 Presence of aortocoronary bypass graft: Secondary | ICD-10-CM | POA: Insufficient documentation

## 2015-11-11 DIAGNOSIS — R918 Other nonspecific abnormal finding of lung field: Secondary | ICD-10-CM

## 2015-11-11 DIAGNOSIS — Z01818 Encounter for other preprocedural examination: Secondary | ICD-10-CM | POA: Diagnosis not present

## 2015-11-11 HISTORY — DX: Malignant (primary) neoplasm, unspecified: C80.1

## 2015-11-11 HISTORY — DX: Chronic obstructive pulmonary disease, unspecified: J44.9

## 2015-11-11 HISTORY — DX: Unspecified osteoarthritis, unspecified site: M19.90

## 2015-11-11 HISTORY — DX: Presence of dental prosthetic device (complete) (partial): Z97.2

## 2015-11-11 HISTORY — DX: Depression, unspecified: F32.A

## 2015-11-11 HISTORY — DX: Major depressive disorder, single episode, unspecified: F32.9

## 2015-11-11 HISTORY — DX: Acute myocardial infarction, unspecified: I21.9

## 2015-11-11 HISTORY — DX: Complete loss of teeth, unspecified cause, unspecified class: K08.109

## 2015-11-11 LAB — COMPREHENSIVE METABOLIC PANEL
ALT: 19 U/L (ref 14–54)
AST: 19 U/L (ref 15–41)
Albumin: 4.2 g/dL (ref 3.5–5.0)
Alkaline Phosphatase: 98 U/L (ref 38–126)
Anion gap: 12 (ref 5–15)
BUN: 8 mg/dL (ref 6–20)
CO2: 26 mmol/L (ref 22–32)
Calcium: 9.8 mg/dL (ref 8.9–10.3)
Chloride: 95 mmol/L — ABNORMAL LOW (ref 101–111)
Creatinine, Ser: 0.77 mg/dL (ref 0.44–1.00)
GFR calc Af Amer: >60 mL/min (ref >60)
GFR calc non Af Amer: >60 mL/min (ref >60)
Glucose, Bld: 95 mg/dL (ref 65–99)
Potassium: 3.8 mmol/L (ref 3.5–5.1)
Sodium: 133 mmol/L — ABNORMAL LOW (ref 135–145)
Total Bilirubin: 0.4 mg/dL (ref 0.3–1.2)
Total Protein: 7.5 g/dL (ref 6.5–8.1)

## 2015-11-11 LAB — CBC
HCT: 47.9 % — ABNORMAL HIGH (ref 36.0–46.0)
Hemoglobin: 16 g/dL — ABNORMAL HIGH (ref 12.0–15.0)
MCH: 30.2 pg (ref 26.0–34.0)
MCHC: 33.4 g/dL (ref 30.0–36.0)
MCV: 90.4 fL (ref 78.0–100.0)
Platelets: 225 10*3/uL (ref 150–400)
RBC: 5.3 MIL/uL — ABNORMAL HIGH (ref 3.87–5.11)
RDW: 13.6 % (ref 11.5–15.5)
WBC: 12.6 10*3/uL — ABNORMAL HIGH (ref 4.0–10.5)

## 2015-11-11 LAB — PROTIME-INR
INR: 0.98 (ref 0.00–1.49)
Prothrombin Time: 13.2 seconds (ref 11.6–15.2)

## 2015-11-11 LAB — APTT: aPTT: 39 seconds — ABNORMAL HIGH (ref 24–37)

## 2015-11-11 LAB — GLUCOSE, CAPILLARY: Glucose-Capillary: 95 mg/dL (ref 65–99)

## 2015-11-11 NOTE — Pre-Procedure Instructions (Signed)
Denise Mckenzie  11/11/2015      [id number] [place], [place] - [address] [place] [address]Phone: [phone]: [phone]   Your procedure is scheduled on Wednesday March 29th 2017.  [facility]  Call this number if you have problems the morning of surgery:  [phone]   Remember:  Do not eat food or drink liquids after midnight Wednesday March 29th.  Take these medicines the morning of surgery with A SIP OF WATER acetaminophen (tylenol) if needed, levothyroxine (synthroid, levothroid), metoprolol (lopressor)  DO NOT TAKE ORAL DIABETES MEDICATION THE MORNING OF SURGERY.  STOP: ALL Vitamins, Supplements, Effient and Herbal Medications, Fish Oils, Aspirins, NSAIDs (Nonsteroidal Anti-inflammatories such as Ibuprofen, Aleve, or Advil), and Goody's/BC Powders 7 days prior to surgery, until after surgery as directed by your physician.     How to Manage Your Diabetes Before and After Surgery  Why is it important to control my blood sugar before and after surgery? . Improving blood sugar levels before and after surgery helps healing and can limit problems. . A way of improving blood sugar control is eating a healthy diet by: o  Eating less sugar and carbohydrates o  Increasing activity/exercise o  Talking with your doctor about reaching your blood sugar goals . High blood sugars (greater than 180 mg/dL) can raise your risk of infections and slow your recovery, so you will need to focus on controlling your diabetes during the weeks before surgery. . Make sure that the doctor who takes care of your diabetes knows about your planned surgery including the date and location.  How do I manage my blood sugar before surgery? . Check your blood sugar at least 4 times a day, starting 2 days before surgery, to make sure that the level is not too high or low. o Check your blood sugar the  morning of your surgery when you wake up and every 2 hours until you get to the Short Stay unit. . If your blood sugar is less than 70 mg/dL, you will need to treat for low blood sugar: o Do not take insulin. o Treat a low blood sugar (less than 70 mg/dL) with  cup of clear juice (cranberry or apple), 4 glucose tablets, OR glucose gel. o Recheck blood sugar in 15 minutes after treatment (to make sure it is greater than 70 mg/dL). If your blood sugar is not greater than 70 mg/dL on recheck, call [phone] further instructions. . Report your blood sugar to the short stay nurse when you get to Short Stay.  . If you are admitted to the hospital after surgery: o Your blood sugar will be checked by the staff and you will probably be given insulin after surgery (instead of oral diabetes medicines) to make sure you have good blood sugar levels. o The goal for blood sugar control after surgery is 80-180 mg/dL.     WHAT DO I DO ABOUT MY DIABETES MEDICATION?   .Marland KitchenDo not take oral diabetes medicines (pills) the morning of surgery.   Do not wear jewelry, make-up or nail polish.  Do not wear lotions, powders, or perfumes.  You may wear deodorant.  Do not shave 48 hours prior to surgery.  Men may shave face and neck.  Do not bring valuables to the hospital.  [facility] belongings or [facility].  Contacts, dentures or bridgework may not be worn into surgery.  Leave your suitcase in the car.  After surgery it may be brought to your room.  For patients admitted to the hospital, discharge time will be determined by your treatment team.  Patients discharged the day of surgery will not be allowed to drive home.        Preparing for Surgery at [facility]  Before surgery, you can play an important role.  Because skin is not sterile, your skin needs to be as free of germs as possible.  You can reduce the number of germs on your skin by washing with CHG (chlorahexidine  gluconate) Soap before surgery.  CHG is an antiseptic cleaner with kills germs and bonds with the skin to continue killing germs even after washing.   Please do not use if you have an allergy to CHG or antibacterial soaps.  If your skin becomes reddened/irritated stop using the CHG.  Do not shave (including legs and underarms) for at least 48 hours prior to first CHG shower.  It is okay to shave your face.  Please follow these instructions carefully:  1. Shower with CHG Soap the night before surgery and the morning of Surgery. 2. If you choose to wash your hair, wash your hair first as usual with your normal shampoo. 3. After you shampoo, rinse your hair and body thoroughly to remove the Shampoo. 4. Use CHG as you would any other liquid soap. You can apply chg directly to the skin and wash gently with scrungie or a clean washcloth. 5. Apply the CHG Soap to your body ONLY FROM THE NECK DOWN. Do not use on open wounds or open sores. Avoid contact with your eyes, ears, mouth and genitals (private parts). Wash genitals (private parts) with your normal soap. 6. Wash thoroughly, paying special attention to the area where your surgery will be performed. 7. Thoroughly rinse your body with warm water from the neck down. 8. DO NOT shower/wash with your normal soap after using and rinsing off the CHG Soap. 9. Pat yourself dry with a clean towel.  10. Wear clean pajamas.  11. Place clean sheets on your bed the night of your first shower and do not sleep with pets.  Day of Surgery  Do not apply any lotions/deodorants the morning of surgery. Please wear clean clothes to the hospital/surgery center.   Please read over the following fact sheets that you were given. Pain Booklet, Coughing and Deep Breathing and Surgical Site Infection Prevention

## 2015-11-11 NOTE — Progress Notes (Signed)
Patient asking about aspirin and when to stop taking it.  Called Levonne Spiller RN in Dr. Everrett Coombe office and she said patient could continue taking aspirin '325mg'$  today and tomorrow but to hold DOS.  Have informed patient and she verbalizes understanding of these instructions.

## 2015-11-12 MED ORDER — CEFAZOLIN SODIUM-DEXTROSE 2-4 GM/100ML-% IV SOLN
2.0000 g | INTRAVENOUS | Status: DC
  Filled 2015-11-12: qty 100

## 2015-11-12 NOTE — Progress Notes (Signed)
Anesthesia Chart Review: Patient is a 70 year old female scheduled for video bronchoscopy with endobronchial navigation, lung biopsy, placement of fiducial markers on 11/13/15 by Dr. Servando Snare.  History includes right lung lesion (suspicious for cancer), former smoker, MI '95, CAD s/p CABG '96, HLD, HTN, hypothyroidism, DM2, depression, osteoporosis, TMJ surgery, tonsillectomy, appendectomy, hysterectomy. During work-up for RUL lung lesion she was also found to have a hypermetabolic left breast lesion. Core biopsy showed invasive carcinoma. She will tentatively undergo left radioactive see localized lumpectomy/left axillary SN biopsy, but final plans will depend on lung biopsy results. BMI 30.6 consistent with mild obesity.   PCP is listed as Dr. Lorene Dy. HEM-ONC is Dr. Julien Nordmann. General surgeon is Dr. Georgette Dover. Cardiologist is Dr. Harrington Challenger (previously Dr. Acie Fredrickson in 2012). She was seen by Cecilie Kicks, NP on 10/24/15 for a pre-operative evaluation. Case discussed with Dr. Harrington Challenger. EKG was stable, she denied CV symptoms, and was able to perform activities such as vacuuming. Mickel Baas   writes, "This is high risk surgery but her risk is low on Lee Criteria. No cardiac tests. Will follow-up with Dr. Harrington Challenger in 2 months."    Meds include Elavil, ASA, Lipitor, Calcium, Boniva, Krill oil, levothyroxine, losartan-HCTZ, metformin, metoprolol, Nitro.  10/24/15 EKG: NSR, low voltage QRS, septal infarct (age undetermined), T wave inversion in V1-2 and high lateral leads which were present on 06/16/11 tracing as well.  Remote history of a normal stress test, EF 79% on 11/06/03.   10/23/15 PFTs: FVC 1.60 59%, FEV1 0.96 47%, DLCO 6.19 28% Pulmonary Function Diagnosis: Severe Obstructive Airways Disease Severe Diffusion Defect c/w emphysema  11/11/15 CXR: IMPRESSION: 1. Persistent nodular density right upper lobe as noted on prior PET-CT. 2. Prior CABG. Heart size normal.  10/11/15 PET scan: IMPRESSION: 1. Intensely  hypermetabolic 1.4 cm right upper lobe pulmonary nodule, in keeping with a primary bronchogenic carcinoma. 2. No hypermetabolic thoracic nodal metastases. No hypermetabolic distant metastatic disease. PET-CT staging is stage IA (T1a N0 M0). 3. Hypermetabolic 1.0 cm soft tissue nodule in the far posterior left breast, which is nonspecific, cannot exclude a primary left breast malignancy. Recommend further evaluation with diagnostic mammography. 4. Additional findings include extensive coronary atherosclerosis status post CABG, mild emphysema with diffuse bronchial wall thickening suggesting COPD, right adrenal adenoma and ectatic atherosclerotic infrarenal abdominal aorta.  Preoperative labs noted. Na 133. Cr 0.77. WBC 12.6. H/H 16.0/47.9. INR 0.98, PTT 39. Glucose 95.   Patient was cleared for surgery by cardiology. Further evaluation on the day of surgery to ensure no acute changes prior to proceeding.  George Hugh [facility] Short Stay Center/Anesthesiology Phone [phone] 11/12/2015 10:35 AM

## 2015-11-13 ENCOUNTER — Ambulatory Visit (HOSPITAL_COMMUNITY)
Admission: RE | Admit: 2015-11-13 | Discharge: 2015-11-13 | Disposition: A | Discharge status: Home / Self Care | Payer: Medicare Other | Source: Ambulatory Visit | Attending: Cardiothoracic Surgery | Admitting: Cardiothoracic Surgery

## 2015-11-13 ENCOUNTER — Encounter (HOSPITAL_COMMUNITY): Payer: Self-pay | Admitting: Certified Registered Nurse Anesthetist

## 2015-11-13 ENCOUNTER — Ambulatory Visit (HOSPITAL_COMMUNITY): Payer: Medicare Other

## 2015-11-13 ENCOUNTER — Ambulatory Visit (HOSPITAL_COMMUNITY): Payer: Medicare Other | Admitting: Vascular Surgery

## 2015-11-13 ENCOUNTER — Encounter (HOSPITAL_COMMUNITY)
Admission: RE | Disposition: A | Discharge status: Home / Self Care | Payer: Self-pay | Source: Ambulatory Visit | Attending: Cardiothoracic Surgery

## 2015-11-13 ENCOUNTER — Ambulatory Visit (HOSPITAL_COMMUNITY): Payer: Medicare Other | Admitting: Certified Registered Nurse Anesthetist

## 2015-11-13 DIAGNOSIS — R918 Other nonspecific abnormal finding of lung field: Secondary | ICD-10-CM

## 2015-11-13 DIAGNOSIS — Z9889 Other specified postprocedural states: Secondary | ICD-10-CM

## 2015-11-13 DIAGNOSIS — Z7984 Long term (current) use of oral hypoglycemic drugs: Secondary | ICD-10-CM | POA: Diagnosis not present

## 2015-11-13 DIAGNOSIS — J449 Chronic obstructive pulmonary disease, unspecified: Secondary | ICD-10-CM | POA: Insufficient documentation

## 2015-11-13 DIAGNOSIS — I252 Old myocardial infarction: Secondary | ICD-10-CM | POA: Insufficient documentation

## 2015-11-13 DIAGNOSIS — I2510 Atherosclerotic heart disease of native coronary artery without angina pectoris: Secondary | ICD-10-CM | POA: Insufficient documentation

## 2015-11-13 DIAGNOSIS — R911 Solitary pulmonary nodule: Secondary | ICD-10-CM

## 2015-11-13 DIAGNOSIS — E039 Hypothyroidism, unspecified: Secondary | ICD-10-CM | POA: Diagnosis not present

## 2015-11-13 DIAGNOSIS — I10 Essential (primary) hypertension: Secondary | ICD-10-CM | POA: Insufficient documentation

## 2015-11-13 DIAGNOSIS — Z955 Presence of coronary angioplasty implant and graft: Secondary | ICD-10-CM | POA: Insufficient documentation

## 2015-11-13 DIAGNOSIS — E119 Type 2 diabetes mellitus without complications: Secondary | ICD-10-CM | POA: Insufficient documentation

## 2015-11-13 DIAGNOSIS — D1431 Benign neoplasm of right bronchus and lung: Secondary | ICD-10-CM | POA: Diagnosis not present

## 2015-11-13 DIAGNOSIS — E785 Hyperlipidemia, unspecified: Secondary | ICD-10-CM | POA: Diagnosis not present

## 2015-11-13 DIAGNOSIS — Z87891 Personal history of nicotine dependence: Secondary | ICD-10-CM | POA: Insufficient documentation

## 2015-11-13 DIAGNOSIS — Z419 Encounter for procedure for purposes other than remedying health state, unspecified: Secondary | ICD-10-CM

## 2015-11-13 HISTORY — PX: LUNG BIOPSY: SHX5088

## 2015-11-13 HISTORY — PX: VIDEO BRONCHOSCOPY WITH ENDOBRONCHIAL NAVIGATION: SHX6175

## 2015-11-13 LAB — GLUCOSE, CAPILLARY
Glucose-Capillary: 130 mg/dL — ABNORMAL HIGH (ref 65–99)
Glucose-Capillary: 118 mg/dL — ABNORMAL HIGH (ref 65–99)

## 2015-11-13 SURGERY — VIDEO BRONCHOSCOPY WITH ENDOBRONCHIAL NAVIGATION
Anesthesia: General | Site: Chest

## 2015-11-13 MED ORDER — EPINEPHRINE HCL 1 MG/ML IJ SOLN
INTRAMUSCULAR | Status: AC
  Filled 2015-11-13: qty 1

## 2015-11-13 MED ORDER — 0.9 % SODIUM CHLORIDE (POUR BTL) OPTIME
TOPICAL | Status: DC | PRN
  Administered 2015-11-13: 1000 mL

## 2015-11-13 MED ORDER — MIDAZOLAM HCL 2 MG/2ML IJ SOLN
INTRAMUSCULAR | Status: AC
  Filled 2015-11-13: qty 2

## 2015-11-13 MED ORDER — ONDANSETRON HCL 4 MG/2ML IJ SOLN
INTRAMUSCULAR | Status: DC | PRN
  Administered 2015-11-13: 4 mg via INTRAVENOUS

## 2015-11-13 MED ORDER — PROPOFOL 10 MG/ML IV BOLUS
INTRAVENOUS | Status: AC
  Filled 2015-11-13: qty 20

## 2015-11-13 MED ORDER — LACTATED RINGERS IV SOLN
INTRAVENOUS | Status: DC
  Administered 2015-11-13 (×3): via INTRAVENOUS

## 2015-11-13 MED ORDER — LIDOCAINE HCL (CARDIAC) 20 MG/ML IV SOLN
INTRAVENOUS | Status: DC | PRN
  Administered 2015-11-13: 50 mg via INTRAVENOUS

## 2015-11-13 MED ORDER — PHENYLEPHRINE HCL 10 MG/ML IJ SOLN
INTRAMUSCULAR | Status: AC
  Filled 2015-11-13: qty 1

## 2015-11-13 MED ORDER — PHENYLEPHRINE HCL 10 MG/ML IJ SOLN
10.0000 mg | INTRAVENOUS | Status: DC | PRN
  Administered 2015-11-13: 25 ug/min via INTRAVENOUS

## 2015-11-13 MED ORDER — PHENYLEPHRINE HCL 10 MG/ML IJ SOLN
INTRAMUSCULAR | Status: DC | PRN
  Administered 2015-11-13: 120 ug via INTRAVENOUS
  Administered 2015-11-13: 40 ug via INTRAVENOUS
  Administered 2015-11-13 (×2): 80 ug via INTRAVENOUS
  Administered 2015-11-13 (×2): 40 ug via INTRAVENOUS

## 2015-11-13 MED ORDER — ROCURONIUM BROMIDE 100 MG/10ML IV SOLN
INTRAVENOUS | Status: DC | PRN
  Administered 2015-11-13: 50 mg via INTRAVENOUS

## 2015-11-13 MED ORDER — MIDAZOLAM HCL 5 MG/5ML IJ SOLN
INTRAMUSCULAR | Status: DC | PRN
  Administered 2015-11-13: 2 mg via INTRAVENOUS

## 2015-11-13 MED ORDER — SUGAMMADEX SODIUM 200 MG/2ML IV SOLN
INTRAVENOUS | Status: AC
  Filled 2015-11-13: qty 2

## 2015-11-13 MED ORDER — FENTANYL CITRATE (PF) 250 MCG/5ML IJ SOLN
INTRAMUSCULAR | Status: AC
  Filled 2015-11-13: qty 5

## 2015-11-13 MED ORDER — SUGAMMADEX SODIUM 200 MG/2ML IV SOLN
INTRAVENOUS | Status: DC | PRN
  Administered 2015-11-13: 147.8 mg via INTRAVENOUS

## 2015-11-13 MED ORDER — PROPOFOL 10 MG/ML IV BOLUS
INTRAVENOUS | Status: DC | PRN
  Administered 2015-11-13: 130 mg via INTRAVENOUS

## 2015-11-13 SURGICAL SUPPLY — 40 items
BRUSH BIOPSY BRONCH 10 SDTNB (MISCELLANEOUS) ×1 IMPLANT
BRUSH SUPERTRAX BIOPSY (INSTRUMENTS) IMPLANT
BRUSH SUPERTRAX NDL-TIP CYTO (INSTRUMENTS) ×1 IMPLANT
CANISTER SUCTION 2500CC (MISCELLANEOUS) ×2 IMPLANT
CHANNEL WORK EXTEND EDGE 180 (KITS) IMPLANT
CHANNEL WORK EXTEND EDGE 45 (KITS) IMPLANT
CHANNEL WORK EXTEND EDGE 90 (KITS) IMPLANT
CONT SPEC 4OZ CLIKSEAL STRL BL (MISCELLANEOUS) ×4 IMPLANT
COVER TABLE BACK 60X90 (DRAPES) ×2 IMPLANT
DRSG AQUACEL AG ADV 3.5X14 (GAUZE/BANDAGES/DRESSINGS) ×1 IMPLANT
FILTER STRAW FLUID ASPIR (MISCELLANEOUS) IMPLANT
FORCEPS BIOP SUPERTRX PREMAR (INSTRUMENTS) ×1 IMPLANT
GAUZE SPONGE 4X4 12PLY STRL (GAUZE/BANDAGES/DRESSINGS) ×2 IMPLANT
GLOVE BIO SURGEON STRL SZ 6.5 (GLOVE) ×2 IMPLANT
GLOVE BIOGEL PI IND STRL 6.5 (GLOVE) IMPLANT
GLOVE BIOGEL PI INDICATOR 6.5 (GLOVE) ×1
GLOVE SS BIOGEL STRL SZ 6.5 (GLOVE) IMPLANT
GLOVE SUPERSENSE BIOGEL SZ 6.5 (GLOVE) ×1
GLOVE SURG SS PI 6.5 STRL IVOR (GLOVE) ×1 IMPLANT
GOWN STRL REUS W/TWL LRG LVL3 (GOWN DISPOSABLE) ×3 IMPLANT
KIT CLEAN ENDO COMPLIANCE (KITS) ×2 IMPLANT
KIT MARKER FIDUCIAL DELIVERY (KITS) ×1 IMPLANT
KIT PROCEDURE EDGE 180 (KITS) ×1 IMPLANT
KIT PROCEDURE EDGE 45 (KITS) IMPLANT
KIT PROCEDURE EDGE 90 (KITS) IMPLANT
KIT ROOM TURNOVER OR (KITS) ×2 IMPLANT
MARKER FIDUCIAL SL NIT COIL (Implant Marker) ×3 IMPLANT
MARKER SKIN DUAL TIP RULER LAB (MISCELLANEOUS) ×2 IMPLANT
NDL SUPERTRX PREMARK BIOPSY (NEEDLE) IMPLANT
NEEDLE SUPERTRX PREMARK BIOPSY (NEEDLE) ×2 IMPLANT
NS IRRIG 1000ML POUR BTL (IV SOLUTION) ×2 IMPLANT
OIL SILICONE PENTAX (PARTS (SERVICE/REPAIRS)) ×2 IMPLANT
PAD ARMBOARD 7.5X6 YLW CONV (MISCELLANEOUS) ×4 IMPLANT
PATCHES PATIENT (LABEL) ×6 IMPLANT
SYR 20CC LL (SYRINGE) ×1 IMPLANT
SYR 20ML ECCENTRIC (SYRINGE) ×2 IMPLANT
TOWEL OR 17X24 6PK STRL BLUE (TOWEL DISPOSABLE) ×2 IMPLANT
TRAP SPECIMEN MUCOUS 40CC (MISCELLANEOUS) ×2 IMPLANT
TUBE CONNECTING 20X1/4 (TUBING) ×2 IMPLANT
UNDERPAD 30X30 (UNDERPADS AND DIAPERS) ×2 IMPLANT

## 2015-11-13 NOTE — Discharge Instructions (Signed)
Pulmonary Nodule  A pulmonary nodule is a small, round spot in your lung. It is usually found when pictures of your lungs are taken for other reasons. Most pulmonary nodules are not cancerous and do not cause symptoms. Tests will be done to make sure the nodule is not cancerous. Pulmonary nodules that are not cancerous usually do not require treatment. HOME CARE   Only take medicine as told by your doctor.  Follow up with your doctor as told. GET HELP IF:  You have trouble breathing when doing activities.  You feel sick or more tired than normal.  You do not feel like eating.  You lose weight without trying to.  You have chills.  You have night sweats. GET HELP RIGHT AWAY IF:  You cannot catch your breath.  You start making whistling sounds when breathing (wheezing).  You have a cough that does not go away.  You cough up blood.  You are dizzy or feel like you are going to pass out.  You have sudden chest pain.  You have a fever or lasting symptoms for more than 2-3 days.  You have a fever and your symptoms suddenly get worse. MAKE SURE YOU:  Understand these instructions.  Will watch your condition.  Will get help right away if you are not doing well or get worse.   This information is not intended to replace advice given to you by your health care provider. Make sure you discuss any questions you have with your health care provider.   Document Released: 09/05/2010 Document Revised: 12/18/2014 Document Reviewed: 01/23/2013 Elsevier Interactive Patient Education 2016 [place].  Flexible Bronchoscopy, Care After Refer to this sheet in the next few weeks. These instructions provide you with information on caring for yourself after your procedure. Your health care provider may also give you more specific instructions. Your treatment has been planned according to current medical practices, but problems sometimes occur. Call your health care provider if you have any  problems or questions after your procedure.  WHAT TO EXPECT AFTER THE PROCEDURE It is normal to have the following symptoms for 24-48 hours after the procedure:   Increased cough.  Low-grade fever.  Sore throat or hoarse voice.  Small streaks of blood in your thick spit (sputum) if tissue samples were taken (biopsy). HOME CARE INSTRUCTIONS   Do not eat or drink anything for 2 hours after your procedure. Your nose and throat were numbed by medicine. If you try to eat or drink before the medicine wears off, food or drink could go into your lungs or you could burn yourself. After the numbness is gone and your cough and gag reflexes have returned, you may eat soft food and drink liquids slowly.   The day after the procedure, you can go back to your normal diet.   You may resume normal activities.   Keep all follow-up visits as directed by your health care provider. It is important to keep all your appointments, especially if tissue samples were taken for testing (biopsy). SEEK IMMEDIATE MEDICAL CARE IF:   You have increasing shortness of breath.   You become light-headed or faint.   You have chest pain.   You have any new concerning symptoms.  You cough up more than a small amount of blood.  The amount of blood you cough up increases. MAKE SURE YOU:  Understand these instructions.  Will watch your condition.  Will get help right away if you are not doing well or  get worse.   This information is not intended to replace advice given to you by your health care provider. Make sure you discuss any questions you have with your health care provider.   Document Released: 02/20/2005 Document Revised: 08/24/2014 Document Reviewed: 04/07/2013 Elsevier Interactive Patient Education [employer].

## 2015-11-13 NOTE — Brief Op Note (Signed)
      [address]       [place] [address]             [phone]     11/13/2015  12:39 PM  PATIENT:  Reita Chard Chenard  70 y.o. female  PRE-OPERATIVE DIAGNOSIS:  RIGHT LUNG MASS  POST-OPERATIVE DIAGNOSIS:  RIGHT LUNG MASS  PROCEDURE:  Procedure(s): VIDEO BRONCHOSCOPY WITH ENDOBRONCHIAL NAVIGATION, with placement of fudicial markers (N/A) RIGHT LUNG BIOPSY (N/A)  SURGEON:  Surgeon(s) and Role:    * Grace Isaac, MD - Primary   ANESTHESIA:   general  EBL:  Total I/O In: 1000 [I.V.:1000] Out: -   BLOOD ADMINISTERED:none  DRAINS: none   LOCAL MEDICATIONS USED:  NONE  SPECIMEN:  Source of Specimen:  right upper lobe  DISPOSITION OF SPECIMEN:  PATHOLOGY  COUNTS:  YES   DICTATION: .Dragon Dictation  PLAN OF CARE: Discharge to home after PACU  PATIENT DISPOSITION:  PACU - hemodynamically stable.   Delay start of Pharmacological VTE agent (>24hrs) due to surgical blood loss or risk of bleeding: yes  findings: initial cytology negative final path and bx pending

## 2015-11-13 NOTE — Anesthesia Postprocedure Evaluation (Signed)
Anesthesia Post Note  Patient: Denise Mckenzie  Procedure(s) Performed: Procedure(s) (LRB): VIDEO BRONCHOSCOPY WITH ENDOBRONCHIAL NAVIGATION, with placement of fudicial markers (N/A) RIGHT LUNG BIOPSY (N/A)  Patient location during evaluation: PACU Anesthesia Type: General Level of consciousness: awake Pain management: pain level controlled Vital Signs Assessment: post-procedure vital signs reviewed and stable Respiratory status: spontaneous breathing Cardiovascular status: stable Anesthetic complications: no    Last Vitals:  Filed Vitals:   11/13/15 1323 11/13/15 1338  BP:  115/59  Pulse: 78 80  Temp:    Resp: 20 20    Last Pain:  Filed Vitals:   11/13/15 1338  PainSc: 0-No pain                 EDWARDS,Jadarius Commons

## 2015-11-13 NOTE — Anesthesia Procedure Notes (Signed)
Procedure Name: Intubation Performed by: Judeth Cornfield T Pre-anesthesia Checklist: Patient identified, Timeout performed, Emergency Drugs available, Suction available and Patient being monitored Patient Re-evaluated:Patient Re-evaluated prior to inductionOxygen Delivery Method: Circle system utilized Preoxygenation: Pre-oxygenation with 100% oxygen Intubation Type: IV induction Ventilation: Mask ventilation without difficulty Laryngoscope Size: Mac and 3 Grade View: Grade I Tube type: Oral Tube size: 8.5 mm Number of attempts: 1 Airway Equipment and Method: Stylet Placement Confirmation: ETT inserted through vocal cords under direct vision,  breath sounds checked- equal and bilateral and positive ETCO2 Secured at: 22 cm Tube secured with: Tape Dental Injury: Teeth and Oropharynx as per pre-operative assessment

## 2015-11-13 NOTE — Anesthesia Postprocedure Evaluation (Signed)
Anesthesia Post Note  Patient: Denise Mckenzie  Procedure(s) Performed: Procedure(s) (LRB): VIDEO BRONCHOSCOPY WITH ENDOBRONCHIAL NAVIGATION, with placement of fudicial markers (N/A) RIGHT LUNG BIOPSY (N/A)  Patient location during evaluation: PACU Pain management: pain level controlled Vital Signs Assessment: post-procedure vital signs reviewed and stable Cardiovascular status: stable Anesthetic complications: no    Last Vitals:  Filed Vitals:   11/13/15 0906 11/13/15 1249  BP: 137/61 134/78  Pulse: 65 76  Temp: 37.3 C 36.4 C  Resp: 20 17    Last Pain: There were no vitals filed for this visit.               EDWARDS,Argus Caraher

## 2015-11-13 NOTE — Anesthesia Preprocedure Evaluation (Signed)
Anesthesia Evaluation  Patient identified by MRN, date of birth, ID band Patient awake    Airway Mallampati: II  TM Distance: >3 FB Neck ROM: Full    Dental  (+) Edentulous Upper, Edentulous Lower, Upper Dentures, Lower Dentures   Pulmonary COPD, former smoker,    breath sounds clear to auscultation + decreased breath sounds      Cardiovascular hypertension, Pt. on home beta blockers and Pt. on medications + CAD, + Past MI and + CABG   Rhythm:Regular Rate:Normal     Neuro/Psych Depression    GI/Hepatic   Endo/Other  diabetes, Type 2, Oral Hypoglycemic AgentsHypothyroidism   Renal/GU      Musculoskeletal   Abdominal   Peds  Hematology   Anesthesia Other Findings   Reproductive/Obstetrics                             Anesthesia Physical Anesthesia Plan  ASA: III  Anesthesia Plan: General   Post-op Pain Management:    Induction: Intravenous  Airway Management Planned: Oral ETT  Additional Equipment:   Intra-op Plan:   Post-operative Plan: Extubation in OR  Informed Consent: I have reviewed the patients History and Physical, chart, labs and discussed the procedure including the risks, benefits and alternatives for the proposed anesthesia with the patient or authorized representative who has indicated his/her understanding and acceptance.   Dental advisory given  Plan Discussed with: Anesthesiologist and Surgeon  Anesthesia Plan Comments:         Anesthesia Quick Evaluation

## 2015-11-13 NOTE — OR Nursing (Signed)
Per Dr. Georgina Snell report: PORTABLE CHEST 1 VIEW  COMPARISON: 10/22/2015  FINDINGS: The patient is status post median sternotomy and CABG procedure. The heart size is normal. No airspace consolidation. Right upper lobe pulmonary nodule is again identified. There is no pneumothorax following bronchoscopy.  IMPRESSION: No pneumothorax identified status post bronchoscopy and biopsy of right upper lobe lung nodule.

## 2015-11-13 NOTE — Transfer of Care (Signed)
Immediate Anesthesia Transfer of Care Note  Patient: Denise Mckenzie  Procedure(s) Performed: Procedure(s): VIDEO BRONCHOSCOPY WITH ENDOBRONCHIAL NAVIGATION, with placement of fudicial markers (N/A) RIGHT LUNG BIOPSY (N/A)  Patient Location: PACU  Anesthesia Type:General  Level of Consciousness: awake and alert   Airway & Oxygen Therapy: Patient Spontanous Breathing and Patient connected to nasal cannula oxygen  Post-op Assessment: Report given to RN, Post -op Vital signs reviewed and stable and Patient moving all extremities  Post vital signs: Reviewed and stable  Last Vitals:  Filed Vitals:   11/13/15 0906  BP: 137/61  Pulse: 65  Temp: 37.3 C  Resp: 20    Complications: No apparent anesthesia complications

## 2015-11-13 NOTE — H&P (Signed)
[address]       [place], [address]             [phone]                    Denise Mckenzie [place] Medical Record #[id number] Date of Birth: 28-Jun-1946  Referring: Dr Mayme Genta Primary Care: Myriam Jacobson, MD  Chief Complaint:    No chief complaint on file.   History of Present Illness:    Denise Mckenzie 70 y.o. female is seen in the office   for evaluation  of right lung lesion. Patient has had no symptoms, denies sob or hemoptysis. She has distant history of smoking for 35 yesrs but quit in 1996 after I preformed CABG on her. She had "routine" chest xray thet revealed right upper lung mass, CT scan and PET scan done.   She had a PET scan performed recently. On PET there was noted a intensely hypermetabolic 1.4 cm right upper lobe pulmonary nodule, in keeping with a primary bronchogenic carcinoma and  No hypermetabolic thoracic nodal metastases. No hypermetabolic distant metastatic disease consistent with  clinicial  staging  stage IA (T1a N0 M0).  In addition a  hypermetabolic 1.0 cm soft tissue nodule in the far posterior left breast. She has a history of questionable lesion in the left breast and this has been evaluated several times by the breast Center and she is currently under close observation. Since seen last week we've arranged for further imaging of her breast and ultrasound-guided needle biopsy of the left breast. 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   Current Activity/ Functional Status:  Patient is independent with mobility/ambulation, transfers, ADL's, IADL's.   Zubrod Score: At the time of surgery this patient's most appropriate activity status/level should be described as: '[]'$     0    Normal activity, no symptoms '[x]'$     1    Restricted in physical strenuous activity but ambulatory, able to do out light work '[]'$     2    Ambulatory and capable of self care, unable to do  work activities, up and about               >50 % of waking hours                              '[]'$     3    Only limited self care, in bed greater than 50% of waking hours '[]'$     4    Completely disabled, no self care, confined to bed or chair '[]'$     5    Moribund   Past Medical History  Diagnosis Date  . Coronary artery disease   . Hyperlipidemia   . Hypertension   . Hypothyroidism 10/22/2015  . Osteoporosis 10/22/2015  . Diabetes mellitus ([place]) 10/22/2015  . Myocardial infarction [facility]) July 1995  . COPD (chronic obstructive pulmonary disease) ([place])   . Depression   . Arthritis   . Cancer ([place])     lung cancer  . Full dentures     full upper dentures, plate on bottom    Past Surgical History  Procedure Laterality Date  . Coronary artery bypass graft      11/1994  . Abdominal hysterectomy      1968  . 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 Bladder surgery      1972  . Tmj arthroplasty      1979  . Tonsillectomy    . Cardiac catheterization  1995, 1996    Dr. Wynonia Lawman saw here then here at [facility] both times; 'they did a balloon and opened it up' first time, the 2nd was scheduled d/t abnormal stress test  . Appendectomy      with hysterectomy  . Colonoscopy w/ polypectomy    . Multiple tooth extractions      Family History  Problem Relation Age of Onset  . Alzheimer's disease Mother   . Heart attack Father     Social History   Social History  . Marital Status: Married    Spouse Name: N/A  . Number of Children: N/A  . Years of Education: N/A   Occupational History  . Not on file.   Social History Main Topics  . Smoking status: Former Smoker    Quit date: 08/17/1994  . Smokeless tobacco: Not on file  . Alcohol Use: No  . Drug Use: No  . Sexual Activity: Not on file   Other Topics Concern  . Not on file   Social History Narrative    History  Smoking status  . Former Smoker  . Quit date: 08/17/1994  Smokeless tobacco  .  Not on file    History  Alcohol Use No     No Known Allergies  Current Facility-Administered Medications  Medication Dose Route Frequency Provider Last Rate Last Dose  . ceFAZolin (ANCEF) IVPB 2g/100 mL premix  2 g Intravenous To SS-Surg Grace Isaac, MD      . lactated ringers infusion   Intravenous Continuous Grace Isaac, MD 50 mL/hr at 11/13/15 3790        Review of Systems:     Cardiac Review of Systems: Y or N  Chest Pain [  n  ]  Resting SOB [n   ] Exertional SOB  Blue.Reese  ]  Orthopnea [  n ]   Pedal Edema Florencio.Farrier   ]    Palpitations [ n ] Syncope  [ n ]   Presyncope [n   ]  General Review of Systems: [Y] = yes [  ]=no Constitional: recent weight change [  ];  Wt loss over the last 3 months [   ] anorexia [  ]; fatigue [  ]; nausea [  ]; night sweats [  ]; fever [  ]; or chills [  ];          Dental: poor dentition[  ]; Last Dentist visit:   Eye : blurred vision [  ]; diplopia [   ]; vision changes [  ];  Amaurosis fugax[  ]; Resp: cough [  ];  wheezing[  ];  hemoptysis[  ]; shortness of breath[  ]; paroxysmal nocturnal dyspnea[  ]; dyspnea on exertion[y  ]; or orthopnea[  ];  GI:  gallstones[  ], vomiting[  ];  dysphagia[  ]; melena[  ];  hematochezia [  ]; heartburn[  ];   Hx of  Colonoscopy[  ]; GU: kidney stones [  ]; hematuria[  ];   dysuria [  ];  nocturia[  ];  history of     obstruction [  ]; urinary frequency [  ]             Skin: rash, swelling[  ];,  hair loss[  ];  peripheral edema[  ];  or itching[  ]; Musculosketetal: myalgias[  ];  joint swelling[  ];  joint erythema[  ];  joint pain[  ];  back pain[  ];  Heme/Lymph: bruising[  ];  bleeding[  ];  anemia[  ];  Neuro: TIA[n  ];  headaches[  n];  stroke[  n];  vertigo[  ];  seizures[  ];   paresthesias[  ];  difficulty walking[n  ];  Psych:depression[  ]; anxiety[  ];  Endocrine: diabetes[ n ];  thyroid dysfunction[  ];  Immunizations: Flu up to date Blue.Reese  ]; Pneumococcal up to date Blue.Reese  ];  Other:  Physical  Exam: BP 137/61 mmHg  Pulse 65  Temp(Src) 99.1 F (37.3 C) (Oral)  Resp 20  Ht '5\' 2"'$  (1.575 m)  Wt 163 lb (73.936 kg)  BMI 29.81 kg/m2  SpO2 97%  PHYSICAL EXAMINATION: General appearance: alert, cooperative, appears stated age and no distress Head: Normocephalic, without obvious abnormality, atraumatic Neck: no adenopathy, no carotid bruit, no JVD, supple, symmetrical, trachea midline and thyroid not enlarged, symmetric, no tenderness/mass/nodules Lymph nodes: Cervical, supraclavicular, and axillary nodes normal. Resp: clear to auscultation bilaterally Back: symmetric, no curvature. ROM normal. No CVA tenderness. Cardio: regular rate and rhythm, S1, S2 normal, no murmur, click, rub or gallop GI: soft, non-tender; bowel sounds normal; no masses,  no organomegaly Extremities: extremities normal, atraumatic, no cyanosis or edema and Homans sign is negative, no sign of DVT Neurologic: Grossly normal  Diagnostic Studies & Laboratory data:     Recent Radiology Findings:  US Breast Ltd Uni Left Inc Axilla  10/28/2015  CLINICAL DATA:  70 year old female who recently had a PET study for a lung mass which showed a hypermetabolic 1 cm soft tissue nodule in the far posterior left breast. EXAM: DIGITAL DIAGNOSTIC LEFT MAMMOGRAM WITH 3D TOMOSYNTHESIS WITH CAD ULTRASOUND LEFT BREAST COMPARISON:  Previous exam(s). ACR Breast Density Category b: There are scattered areas of fibroglandular density. FINDINGS: Far posteriorly in the upper-outer quadrant of the left breast is a 10 mm spiculated mass. No additional masses are seen in the left breast. There are no malignant type microcalcifications. Mammographic images were processed with CAD. On physical exam, I do not palpate a mass in the upper-outer quadrant of the left breast or the axilla. Targeted ultrasound is performed, showing a spiculated hypoechoic mass in the left breast at 2 o'clock 3 cm from the nipple measure 8 x 8 x 10 mm. Sonographic evaluation  of the left axilla shows a 1.2 cm lymph node with a prominent cortex. IMPRESSION: Suspicious mass in the 2 o'clock region of the left breast and suspicious left axillary lymph node. RECOMMENDATION: Ultrasound-guided core biopsies of the left breast mass and left axillary lymph node is recommended. This will be scheduled at the patient's convenience. I have discussed the findings and recommendations with the patient. Results were also provided in writing at the conclusion of the visit. If applicable, a reminder letter will be sent to the patient regarding the next appointment. BI-RADS CATEGORY  5: Highly suggestive of malignancy. Electronically Signed   By: Lillia Mountain M.D.   On: 10/28/2015 16:10     Ct Head W Wo Contrast  10/22/2015  CLINICAL DATA:  70 year old female with new diagnosis of right upper lobe lung cancer. Staging. Subsequent encounter. EXAM: CT HEAD WITHOUT AND WITH CONTRAST TECHNIQUE: Contiguous axial images were obtained from the base of the skull through the vertex without  and with intravenous contrast CONTRAST:  74m OMNIPAQUE IOHEXOL 300 MG/ML  SOLN COMPARISON:  PET-CT 10/11/2015 FINDINGS: Visualized paranasal sinuses and mastoids are clear. Scattered small nonspecific lucent areas in the calvarium (e.g. Series 4, image 20) with no destructive osseous lesion identified. Remote postoperative changes to the posterior left zygomatic arch. Visualized orbits and scalp soft tissues are within normal limits. Calcified atherosclerosis at the skull base. Cerebral volume is within normal limits for age. No midline shift, ventriculomegaly, mass effect, evidence of mass lesion, intracranial hemorrhage or evidence of cortically based acute infarction. Gray-white matter differentiation is within normal limits throughout the brain. No abnormal enhancement identified. Major intracranial vascular structures are enhancing. IMPRESSION: 1. Normal CT appearance of the brain. No metastatic disease to the brain  identified. 2. Occasional small indeterminate lucent areas in the calvarium, favor benign in light of no body PET-CT evidence of skeletal metastasis recently, but attention directed on followup. Electronically Signed   By: HGenevie AnnM.D.   On: 10/22/2015 13:16   Nm Pet Image Initial (pi) Skull Base To Thigh  10/11/2015  CLINICAL DATA:  Initial treatment strategy for right upper lobe pulmonary nodule. EXAM: NUCLEAR MEDICINE PET SKULL BASE TO THIGH TECHNIQUE: 8.6 mCi F-18 FDG was injected intravenously. Full-ring PET imaging was performed from the skull base to thigh after the radiotracer. CT data was obtained and used for attenuation correction and anatomic localization. FASTING BLOOD GLUCOSE:  Value: 114 mg/dl COMPARISON:  09/11/2015 chest CT. FINDINGS: NECK No hypermetabolic lymph nodes in the neck. Subcentimeter coarse calcification in the right thyroid lobe. No hypermetabolic thyroid nodules. CHEST There is an intensely hypermetabolic 1.4 x 1.2 cm right upper lobe pulmonary nodule (series 8/image 21) with max SUV 24.8. No hypermetabolic axillary, mediastinal or hilar nodes. Mild centrilobular and paraseptal emphysema with diffuse bronchial wall thickening. No acute consolidative airspace disease. There are at least 4 scattered tiny pulmonary nodules throughout the right lung, largest 4 mm in the anterior right upper lobe (series 8/image 32), which are below PET resolution and are nonspecific, probably benign. Extensive coronary atherosclerosis status post CABG with left internal mammary and ascending aortic bypass grafts. There is a hypermetabolic 1.0 cm soft tissue nodule in the far posterior left breast (series 5/image 76) with max SUV 4.1. ABDOMEN/PELVIS No abnormal hypermetabolic activity within the liver, pancreas, adrenal glands, or spleen. No hypermetabolic lymph nodes in the abdomen or pelvis. Non hypermetabolic 2.5 cm right adrenal mass with density 9 HU, in keeping with a benign adenoma. Ectasia of the  atherosclerotic infrarenal abdominal aorta, maximum diameter 2.5 cm. Extensive subcutaneous soft tissue calcifications in the bilateral gluteal regions, most prominent overlying the right greater trochanter. SKELETON No focal hypermetabolic activity to suggest skeletal metastasis. IMPRESSION: 1. Intensely hypermetabolic 1.4 cm right upper lobe pulmonary nodule, in keeping with a primary bronchogenic carcinoma. 2. No hypermetabolic thoracic nodal metastases. No hypermetabolic distant metastatic disease. PET-CT staging is stage IA (T1a N0 M0). 3. Hypermetabolic 1.0 cm soft tissue nodule in the far posterior left breast, which is nonspecific, cannot exclude a primary left breast malignancy. Recommend further evaluation with diagnostic mammography. 4. Additional findings include extensive coronary atherosclerosis status post CABG, mild emphysema with diffuse bronchial wall thickening suggesting COPD, right adrenal adenoma and ectatic atherosclerotic infrarenal abdominal aorta. Electronically Signed   By: JIlona SorrelM.D.   On: 10/11/2015 13:49     I have independently reviewed the above radiologic studies.  Recent Lab Findings: Lab Results  Component Value Date   WBC  12.6* 11/11/2015   HGB 16.0* 11/11/2015   HCT 47.9* 11/11/2015   PLT 225 11/11/2015   GLUCOSE 95 11/11/2015   ALT 19 11/11/2015   AST 19 11/11/2015   NA 133* 11/11/2015   K 3.8 11/11/2015   CL 95* 11/11/2015   CREATININE 0.77 11/11/2015   BUN 8 11/11/2015   CO2 26 11/11/2015   INR 0.98 11/11/2015   PFT's FEV1 0,96 47% DLCO 6.19 28% Pulmonary Function Diagnosis: Severe Obstructive Airways Disease Severe Diffusion Defect c/w emphysema  Assessment / Plan:   1/  intensely hypermetabolic 1.4 cm right upper lobe pulmonary nodule, in keeping with a primary bronchogenic carcinoma and  No hypermetabolic thoracic nodal metastases.      No hypermetabolic distant metastatic disease consistent with  clinicial  staging  stage IA (T1a N0 M0)-  the case was discussed and the multi disciplinary thoracic oncology clinic. Patient's pulmonary function studies actually significantly worse than anticipated with severe obstructive airway disease and severe diffusion capacity consistent with emphysema FEV1 is 47% of predicted and DLCO 28%,With the patient's limited pulmonary reserve I recommended to her that we initially proceed with bronchoscopy and ENB for navigational bronchoscopy biopsy of the right upper lobe lung lesion. The films have been reviewed by radiation oncology and she would be a suitable candidate for stereotactic radio therapy treatment of the right upper lobe lung mass.  We will plan the navigation bronchoscopy Wednesday, March 29.  2/  Hypermetabolic 1.0 cm soft tissue nodule in the far posterior left breast. Suspicious mass in the 2 o'clock region of the left breast and suspicious left axillary lymph node.  Ultrasound-guided core biopsies of the left breast mass and left axillary lymph node has been done. The patient was referred to [facility] by the breast center, however the patient would prefer to be seen in [place]. Patient was seen last week  By Dr Gershon Crane    3/ History CAD s/p cabg 1996- patient has recently seen Dr. Radford Pax and been cleared for surgery without further testing   4/Pulmonary Function Diagnosis: Severe Obstructive Airways Disease Severe Diffusion Defect c/w emphysema   The goals risks and alternatives of the planned surgical procedure bronchoscopy with navigation biopsy have been discussed with the patient in detail. The risks of the procedure including death, infection, stroke, myocardial infarction, bleeding, blood transfusion, pneumothorax  have all been discussed specifically.  I have quoted Denise Mckenzie a 2 % of perioperative mortality and a complication rate as high as  10  %. The patient's questions have been answered.Denise Mckenzie is willing  to proceed with the planned procedure.    Grace Isaac MD      [address] [place] [address] Office [phone]   Beeper [phone]  11/13/2015 9:58 AM

## 2015-11-14 ENCOUNTER — Encounter (HOSPITAL_COMMUNITY): Payer: Self-pay | Admitting: Cardiothoracic Surgery

## 2015-11-14 ENCOUNTER — Other Ambulatory Visit: Payer: Self-pay | Admitting: Surgery

## 2015-11-14 DIAGNOSIS — C50912 Malignant neoplasm of unspecified site of left female breast: Secondary | ICD-10-CM

## 2015-11-14 NOTE — Op Note (Signed)
NAMETRINIDY, MASTERSON NO.:  [id number]  MEDICAL RECORD NO.:  [id number]  LOCATION:  [facility]                         FACILITY:  [facility]  PHYSICIAN:  Lanelle Bal, MD    DATE OF BIRTH:  19-Aug-1945  DATE OF PROCEDURE:  11/13/2015 DATE OF DISCHARGE:  11/13/2015                              OPERATIVE REPORT   PREOPERATIVE DIAGNOSIS:  Right upper lobe lung lesion.  POSTOPERATIVE DIAGNOSIS:  Right upper lobe lung lesion.  SURGICAL PROCEDURES:  Bronchoscopy with navigation bronchoscopy and biopsy of right upper lobe lung lesion.  Placement of radiopaque fiducial markers for stereotactic radiotherapy.  SURGEON:  Lanelle Bal, M.D.  BRIEF HISTORY:  The patient is a 70 year old female with known underlying pulmonary disease and previous history of coronary artery bypass grafting, who presented several months previously with some cough.  A routine chest x-ray was performed leading to CT scan and PET scan of the chest.  This demonstrated a 1.4 cm solitary mass in the right upper lobe.  PET scan showed this to be hypermetabolic without evidence of mediastinal disease and also evidence of a left breast mass. We have had a breast mass biopsied and confirmed invasive carcinoma. The patient now presents for biopsy of her right lung lesion.  Risks and options were discussed with the patient in detail.  She agreed and signed informed consent.  DESCRIPTION OF PROCEDURE:  The patient underwent general endotracheal anesthesia without incident.  Previously planned navigation pathways were developed and entered into the Super-D console.  Appropriate time- out was performed.  We then proceeded with standard fiberoptic bronchoscopy to the subsegmental level, both the left and right tracheobronchial tree without evidence of endobronchial lesions.  The scope and sensor were then registered and we proceeded with navigation with the Super-D equipment to the right upper lobe lung  lesion, getting within 0.8 cm of the mass, confirmed also on fluoroscopy.  The navigation was technically precise.  We then proceeded through the working channel with a needle brush, several passes, and a triple brush with several passes and then small biopsy forceps under fluoroscopy. With the known patient's significant underlying pulmonary disease and preop discussion of possible stereotactic radiotherapy for her lesion, we had discussed with her preoperatively placing fiducial markers after biopsies.  We then proceeded with placement of 3 gold fiducial markers in the right upper lobe, triangulating the mass.  Initial cytology on the smear did not confirm malignancy. The remaining biopsies were sent as permanents.  The patient tolerated the procedure without obvious complication, was extubated in the operating room, and transferred to the recovery room for postop care. She tolerated the procedure without obvious complication.     Lanelle Bal, MD     EG/MEDQ  D:  11/14/2015  T:  11/14/2015  Job:  [id number]

## 2015-11-15 ENCOUNTER — Encounter (HOSPITAL_BASED_OUTPATIENT_CLINIC_OR_DEPARTMENT_OTHER): Payer: Self-pay | Admitting: *Deleted

## 2015-11-15 ENCOUNTER — Telehealth: Payer: Self-pay | Admitting: Cardiothoracic Surgery

## 2015-11-15 NOTE — Telephone Encounter (Signed)
Patient called to review the findings of needle biopsy of the right lung lesion, no conclusive diagnosis of lung cancer is made by pathology. Cytology was noted to have "atypical cells". I discussed the findings with Dr. Pablo Ledger, radiation oncology. With a highly suspicious nature of the PET scan and the finding of atypical cells we are in agreement with proceeding with stereotactic radiotherapy to the right upper lobe lung lesion, and not with surgical resection both because of the the significant limitation of her pulmonary function studies especially diffusion capacity. Stereotactic radiotherapy to the right lung lesion will speed the treatment of both the lung mass and the rest cancer. I've left message and Dr Gershon Crane office to proceed with his planned surgery next week. Dr. Pablo Ledger will see the patient for radiation both to the right lung and the left breast postoperatively Grace Isaac MD      [address] [place] [address] Office [phone]   [place]

## 2015-11-18 ENCOUNTER — Ambulatory Visit
Admission: RE | Admit: 2015-11-18 | Discharge: 2015-11-18 | Disposition: A | Discharge status: Home / Self Care | Payer: Medicare Other | Source: Ambulatory Visit | Attending: Surgery | Admitting: Surgery

## 2015-11-18 ENCOUNTER — Other Ambulatory Visit: Payer: Self-pay | Admitting: Surgery

## 2015-11-18 DIAGNOSIS — C50912 Malignant neoplasm of unspecified site of left female breast: Secondary | ICD-10-CM

## 2015-11-19 ENCOUNTER — Ambulatory Visit (HOSPITAL_BASED_OUTPATIENT_CLINIC_OR_DEPARTMENT_OTHER)
Admission: RE | Admit: 2015-11-19 | Discharge: 2015-11-19 | Disposition: A | Discharge status: Home / Self Care | Payer: Medicare Other | Source: Ambulatory Visit | Attending: Surgery | Admitting: Surgery

## 2015-11-19 ENCOUNTER — Encounter (HOSPITAL_BASED_OUTPATIENT_CLINIC_OR_DEPARTMENT_OTHER): Payer: Self-pay | Admitting: Anesthesiology

## 2015-11-19 ENCOUNTER — Ambulatory Visit (HOSPITAL_BASED_OUTPATIENT_CLINIC_OR_DEPARTMENT_OTHER): Payer: Medicare Other | Admitting: Anesthesiology

## 2015-11-19 ENCOUNTER — Ambulatory Visit (HOSPITAL_COMMUNITY)
Admission: RE | Admit: 2015-11-19 | Discharge: 2015-11-19 | Disposition: A | Discharge status: Home / Self Care | Payer: Medicare Other | Source: Ambulatory Visit | Attending: Surgery | Admitting: Surgery

## 2015-11-19 ENCOUNTER — Encounter (HOSPITAL_BASED_OUTPATIENT_CLINIC_OR_DEPARTMENT_OTHER)
Admission: RE | Disposition: A | Discharge status: Home / Self Care | Payer: Self-pay | Source: Ambulatory Visit | Attending: Surgery

## 2015-11-19 ENCOUNTER — Ambulatory Visit
Admission: RE | Admit: 2015-11-19 | Discharge: 2015-11-19 | Disposition: A | Discharge status: Home / Self Care | Payer: Medicare Other | Source: Ambulatory Visit | Attending: Surgery | Admitting: Surgery

## 2015-11-19 DIAGNOSIS — Z85118 Personal history of other malignant neoplasm of bronchus and lung: Secondary | ICD-10-CM | POA: Insufficient documentation

## 2015-11-19 DIAGNOSIS — Z951 Presence of aortocoronary bypass graft: Secondary | ICD-10-CM | POA: Insufficient documentation

## 2015-11-19 DIAGNOSIS — E7800 Pure hypercholesterolemia, unspecified: Secondary | ICD-10-CM | POA: Diagnosis not present

## 2015-11-19 DIAGNOSIS — I10 Essential (primary) hypertension: Secondary | ICD-10-CM | POA: Diagnosis not present

## 2015-11-19 DIAGNOSIS — J439 Emphysema, unspecified: Secondary | ICD-10-CM | POA: Diagnosis not present

## 2015-11-19 DIAGNOSIS — Z7982 Long term (current) use of aspirin: Secondary | ICD-10-CM | POA: Insufficient documentation

## 2015-11-19 DIAGNOSIS — E119 Type 2 diabetes mellitus without complications: Secondary | ICD-10-CM | POA: Diagnosis not present

## 2015-11-19 DIAGNOSIS — D3501 Benign neoplasm of right adrenal gland: Secondary | ICD-10-CM | POA: Diagnosis not present

## 2015-11-19 DIAGNOSIS — Z79899 Other long term (current) drug therapy: Secondary | ICD-10-CM | POA: Diagnosis not present

## 2015-11-19 DIAGNOSIS — R918 Other nonspecific abnormal finding of lung field: Secondary | ICD-10-CM | POA: Diagnosis not present

## 2015-11-19 DIAGNOSIS — F329 Major depressive disorder, single episode, unspecified: Secondary | ICD-10-CM | POA: Insufficient documentation

## 2015-11-19 DIAGNOSIS — I2510 Atherosclerotic heart disease of native coronary artery without angina pectoris: Secondary | ICD-10-CM | POA: Insufficient documentation

## 2015-11-19 DIAGNOSIS — I252 Old myocardial infarction: Secondary | ICD-10-CM | POA: Diagnosis not present

## 2015-11-19 DIAGNOSIS — C50912 Malignant neoplasm of unspecified site of left female breast: Secondary | ICD-10-CM

## 2015-11-19 DIAGNOSIS — Z7983 Long term (current) use of bisphosphonates: Secondary | ICD-10-CM | POA: Insufficient documentation

## 2015-11-19 DIAGNOSIS — Z7984 Long term (current) use of oral hypoglycemic drugs: Secondary | ICD-10-CM | POA: Insufficient documentation

## 2015-11-19 DIAGNOSIS — Z170 Estrogen receptor positive status [ER+]: Secondary | ICD-10-CM | POA: Diagnosis not present

## 2015-11-19 DIAGNOSIS — I77811 Abdominal aortic ectasia: Secondary | ICD-10-CM | POA: Diagnosis not present

## 2015-11-19 DIAGNOSIS — E079 Disorder of thyroid, unspecified: Secondary | ICD-10-CM | POA: Insufficient documentation

## 2015-11-19 DIAGNOSIS — C50412 Malignant neoplasm of upper-outer quadrant of left female breast: Secondary | ICD-10-CM | POA: Diagnosis not present

## 2015-11-19 DIAGNOSIS — Z87891 Personal history of nicotine dependence: Secondary | ICD-10-CM | POA: Insufficient documentation

## 2015-11-19 HISTORY — PX: BREAST LUMPECTOMY: SHX2

## 2015-11-19 HISTORY — PX: RADIOACTIVE SEED GUIDED PARTIAL MASTECTOMY WITH AXILLARY SENTINEL LYMPH NODE BIOPSY: SHX6520

## 2015-11-19 SURGERY — RADIOACTIVE SEED GUIDED PARTIAL MASTECTOMY WITH AXILLARY SENTINEL LYMPH NODE BIOPSY
Anesthesia: General | Site: Breast | Laterality: Left

## 2015-11-19 MED ORDER — SODIUM CHLORIDE 0.9 % IJ SOLN
INTRAMUSCULAR | Status: AC
  Filled 2015-11-19: qty 10

## 2015-11-19 MED ORDER — FENTANYL CITRATE (PF) 100 MCG/2ML IJ SOLN
INTRAMUSCULAR | Status: AC
  Filled 2015-11-19: qty 2

## 2015-11-19 MED ORDER — MIDAZOLAM HCL 5 MG/5ML IJ SOLN: INTRAMUSCULAR | Status: DC | PRN

## 2015-11-19 MED ORDER — TECHNETIUM TC 99M SULFUR COLLOID FILTERED
1.0000 | Freq: Once | INTRAVENOUS | Status: AC | PRN
  Administered 2015-11-19: 1 via INTRADERMAL

## 2015-11-19 MED ORDER — ONDANSETRON HCL 4 MG/2ML IJ SOLN
INTRAMUSCULAR | Status: DC | PRN
  Administered 2015-11-19: 4 mg via INTRAVENOUS

## 2015-11-19 MED ORDER — PHENYLEPHRINE HCL 10 MG/ML IJ SOLN
INTRAMUSCULAR | Status: AC
  Filled 2015-11-19: qty 1

## 2015-11-19 MED ORDER — BUPIVACAINE-EPINEPHRINE 0.25% -1:200000 IJ SOLN
INTRAMUSCULAR | Status: DC | PRN
  Administered 2015-11-19: 10 mL
  Administered 2015-11-19: 8 mL

## 2015-11-19 MED ORDER — HYDROCODONE-ACETAMINOPHEN 5-325 MG PO TABS: 1.0000 | ORAL_TABLET | ORAL | Status: DC | PRN

## 2015-11-19 MED ORDER — EPHEDRINE SULFATE 50 MG/ML IJ SOLN
INTRAMUSCULAR | Status: AC
  Filled 2015-11-19: qty 1

## 2015-11-19 MED ORDER — CEFAZOLIN SODIUM-DEXTROSE 2-3 GM-% IV SOLR
INTRAVENOUS | Status: DC | PRN
  Administered 2015-11-19: 2 g via INTRAVENOUS

## 2015-11-19 MED ORDER — SUCCINYLCHOLINE CHLORIDE 20 MG/ML IJ SOLN
INTRAMUSCULAR | Status: AC
  Filled 2015-11-19: qty 1

## 2015-11-19 MED ORDER — HYDROCODONE-ACETAMINOPHEN 5-325 MG PO TABS
ORAL_TABLET | ORAL | Status: AC
  Filled 2015-11-19: qty 1

## 2015-11-19 MED ORDER — DEXAMETHASONE SODIUM PHOSPHATE 4 MG/ML IJ SOLN
INTRAMUSCULAR | Status: DC | PRN
  Administered 2015-11-19: 10 mg via INTRAVENOUS

## 2015-11-19 MED ORDER — METHYLENE BLUE 0.5 % INJ SOLN
INTRAVENOUS | Status: AC
  Filled 2015-11-19: qty 10

## 2015-11-19 MED ORDER — MIDAZOLAM HCL 2 MG/2ML IJ SOLN
INTRAMUSCULAR | Status: AC
  Filled 2015-11-19: qty 2

## 2015-11-19 MED ORDER — LIDOCAINE HCL (CARDIAC) 20 MG/ML IV SOLN
INTRAVENOUS | Status: AC
  Filled 2015-11-19: qty 5

## 2015-11-19 MED ORDER — CEFAZOLIN SODIUM 1 G IJ SOLR
INTRAMUSCULAR | Status: AC
  Filled 2015-11-19: qty 20

## 2015-11-19 MED ORDER — BUPIVACAINE-EPINEPHRINE (PF) 0.25% -1:200000 IJ SOLN
INTRAMUSCULAR | Status: AC
  Filled 2015-11-19: qty 30

## 2015-11-19 MED ORDER — DEXAMETHASONE SODIUM PHOSPHATE 10 MG/ML IJ SOLN
INTRAMUSCULAR | Status: AC
  Filled 2015-11-19: qty 1

## 2015-11-19 MED ORDER — HYDROMORPHONE HCL 1 MG/ML IJ SOLN: 0.2500 mg | INTRAMUSCULAR | Status: DC | PRN

## 2015-11-19 MED ORDER — PROPOFOL 10 MG/ML IV BOLUS
INTRAVENOUS | Status: AC
  Filled 2015-11-19: qty 40

## 2015-11-19 MED ORDER — ONDANSETRON HCL 4 MG/2ML IJ SOLN
INTRAMUSCULAR | Status: AC
  Filled 2015-11-19: qty 2

## 2015-11-19 MED ORDER — FENTANYL CITRATE (PF) 100 MCG/2ML IJ SOLN: 25.0000 ug | INTRAMUSCULAR | Status: DC | PRN

## 2015-11-19 MED ORDER — MIDAZOLAM HCL 2 MG/2ML IJ SOLN
1.0000 mg | INTRAMUSCULAR | Status: DC | PRN
  Administered 2015-11-19: 1 mg via INTRAVENOUS

## 2015-11-19 MED ORDER — LACTATED RINGERS IV SOLN
INTRAVENOUS | Status: DC
  Administered 2015-11-19 (×3): via INTRAVENOUS

## 2015-11-19 MED ORDER — ONDANSETRON HCL 4 MG/2ML IJ SOLN: 4.0000 mg | INTRAMUSCULAR | Status: DC | PRN

## 2015-11-19 MED ORDER — PHENYLEPHRINE 40 MCG/ML (10ML) SYRINGE FOR IV PUSH (FOR BLOOD PRESSURE SUPPORT)
PREFILLED_SYRINGE | INTRAVENOUS | Status: AC
  Filled 2015-11-19: qty 10

## 2015-11-19 MED ORDER — SCOPOLAMINE 1 MG/3DAYS TD PT72: 1.0000 | MEDICATED_PATCH | Freq: Once | TRANSDERMAL | Status: DC | PRN

## 2015-11-19 MED ORDER — CHLORHEXIDINE GLUCONATE 4 % EX LIQD: 1.0000 "application " | Freq: Once | CUTANEOUS | Status: DC

## 2015-11-19 MED ORDER — FENTANYL CITRATE (PF) 100 MCG/2ML IJ SOLN
50.0000 ug | INTRAMUSCULAR | Status: DC | PRN
  Administered 2015-11-19: 50 ug via INTRAVENOUS

## 2015-11-19 MED ORDER — EPHEDRINE SULFATE 50 MG/ML IJ SOLN
INTRAMUSCULAR | Status: DC | PRN
  Administered 2015-11-19: 10 mg via INTRAVENOUS

## 2015-11-19 MED ORDER — FENTANYL CITRATE (PF) 100 MCG/2ML IJ SOLN
INTRAMUSCULAR | Status: DC | PRN
  Administered 2015-11-19: 25 ug via INTRAVENOUS
  Administered 2015-11-19: 100 ug via INTRAVENOUS

## 2015-11-19 MED ORDER — LIDOCAINE HCL (CARDIAC) 20 MG/ML IV SOLN
INTRAVENOUS | Status: DC | PRN
  Administered 2015-11-19: 50 mg via INTRAVENOUS

## 2015-11-19 MED ORDER — HYDROCODONE-ACETAMINOPHEN 5-325 MG PO TABS
1.0000 | ORAL_TABLET | ORAL | Status: DC | PRN
  Administered 2015-11-19: 1 via ORAL

## 2015-11-19 MED ORDER — BUPIVACAINE-EPINEPHRINE (PF) 0.5% -1:200000 IJ SOLN
INTRAMUSCULAR | Status: DC | PRN
  Administered 2015-11-19: 30 mL

## 2015-11-19 MED ORDER — PHENYLEPHRINE HCL 10 MG/ML IJ SOLN
INTRAMUSCULAR | Status: DC | PRN
  Administered 2015-11-19 (×7): 80 ug via INTRAVENOUS

## 2015-11-19 MED ORDER — GLYCOPYRROLATE 0.2 MG/ML IJ SOLN: 0.2000 mg | Freq: Once | INTRAMUSCULAR | Status: DC | PRN

## 2015-11-19 MED ORDER — MORPHINE SULFATE (PF) 2 MG/ML IV SOLN: 2.0000 mg | INTRAVENOUS | Status: DC | PRN

## 2015-11-19 MED ORDER — ATROPINE SULFATE 0.4 MG/ML IJ SOLN
INTRAMUSCULAR | Status: AC
  Filled 2015-11-19: qty 1

## 2015-11-19 MED ORDER — PROPOFOL 10 MG/ML IV BOLUS
INTRAVENOUS | Status: DC | PRN
  Administered 2015-11-19: 50 mg via INTRAVENOUS
  Administered 2015-11-19: 150 mg via INTRAVENOUS

## 2015-11-19 SURGICAL SUPPLY — 63 items
APL SKNCLS STERI-STRIP NONHPOA (GAUZE/BANDAGES/DRESSINGS) ×1
APPLIER CLIP 9.375 MED OPEN (MISCELLANEOUS) ×3
APR CLP MED 9.3 20 MLT OPN (MISCELLANEOUS) ×1
BENZOIN TINCTURE PRP APPL 2/3 (GAUZE/BANDAGES/DRESSINGS) ×3 IMPLANT
BINDER BREAST 3XL (BIND) IMPLANT
BINDER BREAST LRG (GAUZE/BANDAGES/DRESSINGS) IMPLANT
BINDER BREAST MEDIUM (GAUZE/BANDAGES/DRESSINGS) IMPLANT
BINDER BREAST XLRG (GAUZE/BANDAGES/DRESSINGS) IMPLANT
BINDER BREAST XXLRG (GAUZE/BANDAGES/DRESSINGS) IMPLANT
BLADE CLIPPER SURG (BLADE) IMPLANT
BLADE HEX COATED 2.75 (ELECTRODE) ×3 IMPLANT
BLADE SURG 10 STRL SS (BLADE) IMPLANT
BLADE SURG 15 STRL LF DISP TIS (BLADE) ×1 IMPLANT
BLADE SURG 15 STRL SS (BLADE) ×3
CANISTER SUCT 1200ML W/VALVE (MISCELLANEOUS) IMPLANT
CHLORAPREP W/TINT 26ML (MISCELLANEOUS) ×3 IMPLANT
CLIP APPLIE 9.375 MED OPEN (MISCELLANEOUS) IMPLANT
CLOSURE WOUND 1/2 X4 (GAUZE/BANDAGES/DRESSINGS) ×1
COVER BACK TABLE 60X90IN (DRAPES) ×3 IMPLANT
COVER MAYO STAND STRL (DRAPES) ×3 IMPLANT
COVER PROBE W GEL 5X96 (DRAPES) ×3 IMPLANT
DECANTER SPIKE VIAL GLASS SM (MISCELLANEOUS) IMPLANT
DEVICE DUBIN W/COMP PLATE 8390 (MISCELLANEOUS) ×3 IMPLANT
DRAPE LAPAROSCOPIC ABDOMINAL (DRAPES) ×3 IMPLANT
DRAPE UTILITY XL STRL (DRAPES) ×3 IMPLANT
DRSG TEGADERM 4X4.75 (GAUZE/BANDAGES/DRESSINGS) ×6 IMPLANT
ELECT BLADE 4.0 EZ CLEAN MEGAD (MISCELLANEOUS) ×3
ELECT REM PT RETURN 9FT ADLT (ELECTROSURGICAL) ×3
ELECTRODE BLDE 4.0 EZ CLN MEGD (MISCELLANEOUS) IMPLANT
ELECTRODE REM PT RTRN 9FT ADLT (ELECTROSURGICAL) ×1 IMPLANT
GLOVE BIO SURGEON STRL SZ7 (GLOVE) ×3 IMPLANT
GLOVE BIOGEL PI IND STRL 6.5 (GLOVE) IMPLANT
GLOVE BIOGEL PI IND STRL 7.0 (GLOVE) IMPLANT
GLOVE BIOGEL PI IND STRL 7.5 (GLOVE) ×1 IMPLANT
GLOVE BIOGEL PI INDICATOR 6.5 (GLOVE) ×2
GLOVE BIOGEL PI INDICATOR 7.0 (GLOVE) ×2
GLOVE BIOGEL PI INDICATOR 7.5 (GLOVE) ×2
GLOVE ECLIPSE 6.5 STRL STRAW (GLOVE) ×2 IMPLANT
GOWN STRL REUS W/ TWL LRG LVL3 (GOWN DISPOSABLE) ×2 IMPLANT
GOWN STRL REUS W/TWL LRG LVL3 (GOWN DISPOSABLE) ×6
KIT MARKER MARGIN INK (KITS) ×3 IMPLANT
NDL HYPO 25X1 1.5 SAFETY (NEEDLE) ×2 IMPLANT
NDL SAFETY ECLIPSE 18X1.5 (NEEDLE) ×1 IMPLANT
NEEDLE HYPO 18GX1.5 SHARP (NEEDLE) ×3
NEEDLE HYPO 25X1 1.5 SAFETY (NEEDLE) ×6 IMPLANT
NS IRRIG 1000ML POUR BTL (IV SOLUTION) IMPLANT
PACK BASIN DAY SURGERY FS (CUSTOM PROCEDURE TRAY) ×3 IMPLANT
PENCIL BUTTON HOLSTER BLD 10FT (ELECTRODE) ×3 IMPLANT
SLEEVE SCD COMPRESS KNEE MED (MISCELLANEOUS) ×3 IMPLANT
SPONGE GAUZE 4X4 12PLY STER LF (GAUZE/BANDAGES/DRESSINGS) IMPLANT
SPONGE LAP 18X18 X RAY DECT (DISPOSABLE) IMPLANT
SPONGE LAP 4X18 X RAY DECT (DISPOSABLE) ×3 IMPLANT
STRIP CLOSURE SKIN 1/2X4 (GAUZE/BANDAGES/DRESSINGS) ×2 IMPLANT
SUT MON AB 4-0 PC3 18 (SUTURE) ×3 IMPLANT
SUT SILK 2 0 SH (SUTURE) IMPLANT
SUT VIC AB 3-0 SH 27 (SUTURE) ×3
SUT VIC AB 3-0 SH 27X BRD (SUTURE) ×1 IMPLANT
SYR CONTROL 10ML LL (SYRINGE) ×6 IMPLANT
TOWEL OR 17X24 6PK STRL BLUE (TOWEL DISPOSABLE) ×3 IMPLANT
TOWEL OR NON WOVEN STRL DISP B (DISPOSABLE) ×2 IMPLANT
TUBE CONNECTING 20'X1/4 (TUBING)
TUBE CONNECTING 20X1/4 (TUBING) IMPLANT
YANKAUER SUCT BULB TIP NO VENT (SUCTIONS) IMPLANT

## 2015-11-19 NOTE — Transfer of Care (Signed)
Immediate Anesthesia Transfer of Care Note  Patient: Denise Mckenzie  Procedure(s) Performed: Procedure(s): RADIOACTIVE SEED GUIDED PARTIAL MASTECTOMY WITH AXILLARY SENTINEL LYMPH NODE BIOPSY (Left)  Patient Location: PACU  Anesthesia Type:GA combined with regional for post-op pain  Level of Consciousness: awake and patient cooperative  Airway & Oxygen Therapy: Patient Spontanous Breathing and Patient connected to face mask oxygen  Post-op Assessment: Report given to RN and Post -op Vital signs reviewed and stable  Post vital signs: Reviewed and stable  Last Vitals:  Filed Vitals:   11/19/15 1155 11/19/15 1200  BP:    Pulse: 77 79  Temp:    Resp: 21 22    Complications: No apparent anesthesia complications

## 2015-11-19 NOTE — Anesthesia Preprocedure Evaluation (Signed)
Anesthesia Evaluation  Patient identified by MRN, date of birth, ID band Patient awake    Airway Mallampati: II  TM Distance: >3 FB Neck ROM: Full    Dental  (+) Edentulous Upper, Edentulous Lower, Upper Dentures, Lower Dentures   Pulmonary COPD, former smoker,    breath sounds clear to auscultation + decreased breath sounds      Cardiovascular hypertension, Pt. on home beta blockers and Pt. on medications + CAD, + Past MI and + CABG   Rhythm:Regular Rate:Normal     Neuro/Psych Depression    GI/Hepatic   Endo/Other  diabetes, Type 2, Oral Hypoglycemic AgentsHypothyroidism   Renal/GU      Musculoskeletal   Abdominal   Peds  Hematology   Anesthesia Other Findings   Reproductive/Obstetrics                             Anesthesia Physical  Anesthesia Plan  ASA: III  Anesthesia Plan: General   Post-op Pain Management:    Induction: Intravenous  Airway Management Planned:   Additional Equipment:   Intra-op Plan:   Post-operative Plan: Extubation in OR  Informed Consent: I have reviewed the patients History and Physical, chart, labs and discussed the procedure including the risks, benefits and alternatives for the proposed anesthesia with the patient or authorized representative who has indicated his/her understanding and acceptance.   Dental advisory given  Plan Discussed with: Anesthesiologist and Surgeon  Anesthesia Plan Comments:         Anesthesia Quick Evaluation

## 2015-11-19 NOTE — Interval H&P Note (Signed)
History and Physical Interval Note:  11/19/2015 10:28 AM  Denise Mckenzie  has presented today for surgery, with the diagnosis of LEFT INVASIVE DUCTAL CARCINOMA  The various methods of treatment have been discussed with the patient and family. After consideration of risks, benefits and other options for treatment, the patient has consented to  Procedure(s): RADIOACTIVE SEED GUIDED PARTIAL MASTECTOMY WITH AXILLARY SENTINEL LYMPH NODE BIOPSY (Left) as a surgical intervention .  The patient's history has been reviewed, patient examined, no change in status, stable for surgery.  I have reviewed the patient's chart and labs.  Questions were answered to the patient's satisfaction.     Lizanne Erker K.

## 2015-11-19 NOTE — Progress Notes (Signed)
Assisted Dr. Marcell Barlow with left, ultrasound guided, pectoralis block. Side rails up, monitors on throughout procedure. See vital signs in flow sheet. Tolerated Procedure well.

## 2015-11-19 NOTE — Op Note (Signed)
Pre-op diagnosis:  Invasive ductal carcinoma - left breast Post-op diagnosis:  Same Procedure performed:  1.  Left radioactive seed localized lumpectomy    2.  Left axillary sentinel lymph node biopsy Surgeon:  Pernell Lenoir K. Anesthesia:  GEN LMA Indications: This is a 70 year old female who was recently diagnosed with right upper lobe lung cancer. On a PET CT scan she had a 1 cm area of hypermetabolism in her posterior left breast. Mammograms were performed as well as ultrasound. Core biopsy showed invasive ductal carcinoma ER and PR positive Ki-67 6% HER-2 negative. Clinically her axilla is negative. She underwent radioactive seed placement yesterday for localization. She underwent injection of technetium sulfur colloid around the nipple in the preop holding area. The presence of the seed was confirmed with neoprobe in the holding area.  Description of procedure: The patient is brought to the operating room and placed in a supine position on the operating room table. After an adequate level of general anesthesia was obtained, her left chest and axilla were prepped with ChloraPrep and draped in sterile fashion. A timeout was taken to ensure the proper patient and proper procedure. We interrogated the left breast with the neoprobe and identified an area of activity behind the upper part of her nipple. On mammogram, the tumor is in the deep posterior part of the breast on the chest wall. We made a curvilinear circumareolar incision around the upper part nipple. We dissected straight down into the breast tissue with cautery. After we were about 4 cm deep we began raising margins around the area of greatest activity. We used the neoprobe for guidance. We dissected all the way down to the chest wall and began dissecting the specimen away from the muscle. I could palpate an area of firmness within the most posterior portion of the specimen. I excised part of the muscle immediately posterior to this area. The  specimen was removed entirely and oriented with a paint kit. Specimen mammogram revealed the biopsy clip and radioactive seed within the deep portion of the specimen. This was sent for pathologic examination. We inspected the wound thoroughly for hemostasis. We irrigated thoroughly. We packed the wound with saline moistened gauze.  We then located an area of activity in the axilla with the neoprobe. We did change the settings on the probe for the sentinel lymph node biopsy. We made a transverse incision over this area. We dissected down into the axillary tissue with cautery. When identified an area of increased activity and excised a cluster of lymph nodes. This had an ex vivo count of 945. This was sent as sentinel lymph node #1. We again interrogated the axilla and noted another area of increased activity. We were able to remove a another sentinel lymph node marked lymph node #2 with an ex vivo count of 350. At this point background activity is minimal. We irrigated both wounds thoroughly and inspected for hemostasis. We closed both wounds with deep layers of 3-0 Vicryl and subcuticular layer of 4-0 Monocryl. Steri-Strips and clean dressings were applied. The patient was then extubated and brought to recovery was stable condition. All sponge, instrument, and needle counts are correct.  Imogene Burn. Georgette Dover, MD, [facility]  General/ Trauma Surgery  11/19/2015 2:16 PM

## 2015-11-19 NOTE — Discharge Instructions (Signed)
[facility] °Office Phone Number [phone] ° °BREAST BIOPSY/ PARTIAL MASTECTOMY: POST OP INSTRUCTIONS ° °Always review your discharge instruction sheet given to you by the facility where your surgery was performed. ° °IF YOU HAVE DISABILITY OR FAMILY LEAVE FORMS, YOU MUST BRING THEM TO THE OFFICE FOR PROCESSING.  DO NOT GIVE THEM TO YOUR DOCTOR. ° °1. A prescription for pain medication may be given to you upon discharge.  Take your pain medication as prescribed, if needed.  If narcotic pain medicine is not needed, then you may take acetaminophen (Tylenol) or ibuprofen (Advil) as needed. °2. Take your usually prescribed medications unless otherwise directed °3. If you need a refill on your pain medication, please contact your pharmacy.  They will contact our office to request authorization.  Prescriptions will not be filled after 5pm or on week-ends. °4. You should eat very light the first 24 hours after surgery, such as soup, crackers, pudding, etc.  Resume your normal diet the day after surgery. °5. Most patients will experience some swelling and bruising in the breast.  Ice packs and a good support bra will help.  Swelling and bruising can take several days to resolve.  °6. It is common to experience some constipation if taking pain medication after surgery.  Increasing fluid intake and taking a stool softener will usually help or prevent this problem from occurring.  A mild laxative (Milk of Magnesia or Miralax) should be taken according to package directions if there are no bowel movements after 48 hours. °7. Unless discharge instructions indicate otherwise, you may remove your bandages 24-48 hours after surgery, and you may shower at that time.  You may have steri-strips (small skin tapes) in place directly over the incision.  These strips should be left on the skin for 7-10 days.  If your surgeon used skin glue on the incision, you may shower in 24 hours.  The glue will flake off over the  next 2-3 weeks.  Any sutures or staples will be removed at the office during your follow-up visit. °8. ACTIVITIES:  You may resume regular daily activities (gradually increasing) beginning the next day.  Wearing a good support bra or sports bra minimizes pain and swelling.  You may have sexual intercourse when it is comfortable. °a. You may drive when you no longer are taking prescription pain medication, you can comfortably wear a seatbelt, and you can safely maneuver your car and apply brakes. °b. RETURN TO WORK:  ______________________________________________________________________________________ °9. You should see your doctor in the office for a follow-up appointment approximately two weeks after your surgery.  Your doctor’s nurse will typically make your follow-up appointment when she calls you with your pathology report.  Expect your pathology report 2-3 business days after your surgery.  You may call to check if you do not hear from us after three days. °10. OTHER INSTRUCTIONS: _______________________________________________________________________________________________ _____________________________________________________________________________________________________________________________________ °_____________________________________________________________________________________________________________________________________ °_____________________________________________________________________________________________________________________________________ ° °WHEN TO CALL YOUR DOCTOR: °1. Fever over 101.0 °2. Nausea and/or vomiting. °3. Extreme swelling or bruising. °4. Continued bleeding from incision. °5. Increased pain, redness, or drainage from the incision. ° °The clinic staff is available to answer your questions during regular business hours.  Please don’t hesitate to call and ask to speak to one of the nurses for clinical concerns.  If you have a medical emergency, go to the nearest  emergency room or call 911.  A surgeon from [facility] is always on call at the hospital. ° °For further questions, please visit [email or web address]  ° ° ° °  Post Anesthesia Home Care Instructions ° °Activity: °Get plenty of rest for the remainder of the day. A responsible adult should stay with you for 24 hours following the procedure.  °For the next 24 hours, DO NOT: °-Drive a car °-Operate machinery °-Drink alcoholic beverages °-Take any medication unless instructed by your physician °-Make any legal decisions or sign important papers. ° °Meals: °Start with liquid foods such as gelatin or soup. Progress to regular foods as tolerated. Avoid greasy, spicy, heavy foods. If nausea and/or vomiting occur, drink only clear liquids until the nausea and/or vomiting subsides. Call your physician if vomiting continues. ° °Special Instructions/Symptoms: °Your throat may feel dry or sore from the anesthesia or the breathing tube placed in your throat during surgery. If this causes discomfort, gargle with warm salt water. The discomfort should disappear within 24 hours. ° °If you had a scopolamine patch placed behind your ear for the management of post- operative nausea and/or vomiting: ° °1. The medication in the patch is effective for 72 hours, after which it should be removed.  Wrap patch in a tissue and discard in the trash. Wash hands thoroughly with soap and water. °2. You may remove the patch earlier than 72 hours if you experience unpleasant side effects which may include dry mouth, dizziness or visual disturbances. °3. Avoid touching the patch. Wash your hands with soap and water after contact with the patch. °  ° °

## 2015-11-19 NOTE — Anesthesia Postprocedure Evaluation (Signed)
Anesthesia Post Note  Patient: Cadyn Rodger Kann  Procedure(s) Performed: Procedure(s) (LRB): RADIOACTIVE SEED GUIDED PARTIAL MASTECTOMY WITH AXILLARY SENTINEL LYMPH NODE BIOPSY (Left)  Patient location during evaluation: PACU Anesthesia Type: General Level of consciousness: awake and alert Pain management: pain level controlled Vital Signs Assessment: post-procedure vital signs reviewed and stable Respiratory status: spontaneous breathing, nonlabored ventilation, respiratory function stable and patient connected to nasal cannula oxygen Cardiovascular status: blood pressure returned to baseline and stable Postop Assessment: no signs of nausea or vomiting Anesthetic complications: no    Last Vitals:  Filed Vitals:   11/19/15 1412 11/19/15 1415  BP: 143/83   Pulse: 86   Temp:  36.6 C  Resp:      Last Pain: There were no vitals filed for this visit.               Montez Hageman

## 2015-11-19 NOTE — H&P (View-Only) (Signed)
History of Present Illness Denise Mckenzie. Octavion Mollenkopf MD; 11/09/2015 10:01 AM) Patient words: breast eval.  The patient is a 70 year old female who presents with breast cancer. PCP - Dr. Sharyn Lull Collins/ Dr. Lorene Dy Cardiothoracic - Dr. Ceasar Mons Oncology - Dr. Julien Nordmann  Reason for consultation - new left breast cancer  This is a 70 yo female who is being worked up for a right upper lung mass noted on chest x-ray. CT/PET showed an intensely hypermetabolic 1.4 cm right upper lobe pulmonary nodule with no sign of thoracic nodal disease. These findings are consistent with stage 1A primary bronchogenic carcinoma. She is scheduled to under go biopsy of this lesion on 11/13/15. The PET/CT also showed an incidental finding of a hypermetabolic 1.0 cm mass in her posterior left breast. There have been some questionable findings in this area on recent mammograms and she has been followed closely with q6 month studies. She underwent mammogram and ultrasound, as well as core biopsy, which revealed an invasive ductal carcinoma. The lymph node core biopsy was negative.  The patient denies any family history or breast problems. The only family history was a maternal aunt who was diagnosed with breast cancer in her 93's Menarche - 32 First pregnancy - 18 Breastfeed - no Oral contraceptives - 7 months - became pregnant on OCP No other hormones  CLINICAL DATA: Initial treatment strategy for right upper lobe pulmonary nodule.  EXAM: NUCLEAR MEDICINE PET SKULL BASE TO THIGH  TECHNIQUE: 8.6 mCi F-18 FDG was injected intravenously. Full-ring PET imaging was performed from the skull base to thigh after the radiotracer. CT data was obtained and used for attenuation correction and anatomic localization.  FASTING BLOOD GLUCOSE: Value: 114 mg/dl  COMPARISON: 09/11/2015 chest CT.  FINDINGS: NECK  No hypermetabolic lymph nodes in the neck. Subcentimeter coarse calcification in the right thyroid  lobe. No hypermetabolic thyroid nodules.  CHEST  There is an intensely hypermetabolic 1.4 x 1.2 cm right upper lobe pulmonary nodule (series 8/image 21) with max SUV 24.8.  No hypermetabolic axillary, mediastinal or hilar nodes. Mild centrilobular and paraseptal emphysema with diffuse bronchial wall thickening. No acute consolidative airspace disease. There are at least 4 scattered tiny pulmonary nodules throughout the right lung, largest 4 mm in the anterior right upper lobe (series 8/image 32), which are below PET resolution and are nonspecific, probably benign. Extensive coronary atherosclerosis status post CABG with left internal mammary and ascending aortic bypass grafts.  There is a hypermetabolic 1.0 cm soft tissue nodule in the far posterior left breast (series 5/image 76) with max SUV 4.1.  ABDOMEN/PELVIS  No abnormal hypermetabolic activity within the liver, pancreas, adrenal glands, or spleen. No hypermetabolic lymph nodes in the abdomen or pelvis. Non hypermetabolic 2.5 cm right adrenal mass with density 9 HU, in keeping with a benign adenoma. Ectasia of the atherosclerotic infrarenal abdominal aorta, maximum diameter 2.5 cm. Extensive subcutaneous soft tissue calcifications in the bilateral gluteal regions, most prominent overlying the right greater trochanter.  SKELETON  No focal hypermetabolic activity to suggest skeletal metastasis.  IMPRESSION: 1. Intensely hypermetabolic 1.4 cm right upper lobe pulmonary nodule, in keeping with a primary bronchogenic carcinoma. 2. No hypermetabolic thoracic nodal metastases. No hypermetabolic distant metastatic disease. PET-CT staging is stage IA (T1a N0 M0). 3. Hypermetabolic 1.0 cm soft tissue nodule in the far posterior left breast, which is nonspecific, cannot exclude a primary left breast malignancy. Recommend further evaluation with diagnostic mammography. 4. Additional findings include extensive coronary  atherosclerosis status post  CABG, mild emphysema with diffuse bronchial wall thickening suggesting COPD, right adrenal adenoma and ectatic atherosclerotic infrarenal abdominal aorta.   Electronically Signed By: Ilona Sorrel M.D. On: 10/11/2015 13:49  CLINICAL DATA: 70 year old female who recently had a PET study for a lung mass which showed a hypermetabolic 1 cm soft tissue nodule in the far posterior left breast.  EXAM: DIGITAL DIAGNOSTIC LEFT MAMMOGRAM WITH 3D TOMOSYNTHESIS WITH CAD  ULTRASOUND LEFT BREAST  COMPARISON: Previous exam(s).  ACR Breast Density Category b: There are scattered areas of fibroglandular density.  FINDINGS: Far posteriorly in the upper-outer quadrant of the left breast is a 10 mm spiculated mass. No additional masses are seen in the left breast. There are no malignant type microcalcifications.  Mammographic images were processed with CAD.  On physical exam, I do not palpate a mass in the upper-outer quadrant of the left breast or the axilla.  Targeted ultrasound is performed, showing a spiculated hypoechoic mass in the left breast at 2 o'clock 3 cm from the nipple measure 8 x 8 x 10 mm. Sonographic evaluation of the left axilla shows a 1.2 cm lymph node with a prominent cortex.  IMPRESSION: Suspicious mass in the 2 o'clock region of the left breast and suspicious left axillary lymph node.  RECOMMENDATION: Ultrasound-guided core biopsies of the left breast mass and left axillary lymph node is recommended. This will be scheduled at the patient's convenience.  I have discussed the findings and recommendations with the patient. Results were also provided in writing at the conclusion of the visit. If applicable, a reminder letter will be sent to the patient regarding the next appointment.  BI-RADS CATEGORY 5: Highly suggestive of malignancy.   Electronically Signed By: Lillia Mountain M.D. On: 10/28/2015 16:10  CLINICAL DATA:  70 year old female with history of lung cancer and a mass in the left breast identified on a PET-CT presents for ultrasound-guided biopsy of this left breast mass and a left axillary lymph nodes.  EXAM: ULTRASOUND GUIDED LEFT BREAST CORE NEEDLE BIOPSY  COMPARISON: Previous exam(s).  FINDINGS: I met with the patient and we discussed the procedure of ultrasound-guided biopsy, including benefits and alternatives. We discussed the high likelihood of a successful procedure. We discussed the risks of the procedure, including infection, bleeding, tissue injury, clip migration, and inadequate sampling. Informed written consent was given. The usual time-out protocol was performed immediately prior to the procedure.  Using sterile technique and 1% Lidocaine as local anesthetic, under direct ultrasound visualization, a 14 Gauge spring-loaded device was used to perform biopsy of a mass in the upper-outer quadrant of the left breast using a lateral approach. At the conclusion of the procedure a ribbon shaped tissue marker clip was deployed into the biopsy cavity.  Using sterile technique and 1% Lidocaine as local anesthetic, under direct ultrasound visualization, a 14 gauge spring-loaded device was used to perform biopsy of left axillary lymph node using a lateral approach. At the conclusion of the procedure a HydroMARK tissue marker clip was deployed into the biopsy cavity. Follow up 2 view mammogram was performed and dictated separately.  IMPRESSION: Ultrasound guided biopsy of a mass in the upper outer quadrant of the left breast and a left axillary lymph node. No apparent complications.   Electronically Signed By: Ammie Ferrier M.D. On: 11/04/2015 15:26  Diagnosis 1. Breast, left, needle core biopsy, upper outer quadrant - INVASIVE DUCTAL CARCINOMA. - SEE COMMENT. 2. Lymph node, needle/core biopsy, left axilla - THERE IS NO EVIDENCE OF CARCINOMA IN 1 OF  1 LYMPH NODE  (0/1). - SEE COMMENT. Microscopic Comment 1. and 2. The carcinoma in part 1 appears grade 2. A breast prognostic profile will be performed and the results reported separately. The results were called to [facility] on 11/05/15. (JBK:gt, 11/05/15) Enid Cutter MD Pathologist, Electronic Signature (Case signed 11/05/2015)  ADDITIONAL INFORMATION: 1. PROGNOSTIC INDICATORS Results: IMMUNOHISTOCHEMICAL AND MORPHOMETRIC ANALYSIS PERFORMED MANUALLY Estrogen Receptor: 100%, POSITIVE, STRONG STAINING INTENSITY Progesterone Receptor: 100%, POSITIVE, STRONG STAINING INTENSITY Proliferation Marker Ki67: 6% REFERENCE RANGE ESTROGEN RECEPTOR NEGATIVE 0% POSITIVE =>1% REFERENCE RANGE PROGESTERONE RECEPTOR NEGATIVE 0% POSITIVE =>1% All controls stained appropriately Enid Cutter MD Pathologist, Electronic Signature ( Signed 11/07/2015) 1. FLUORESCENCE IN-SITU HYBRIDIZATION Results: HER2 - NEGATIVE RATIO OF HER2/CEP17 SIGNALS 1.32 AVERAGE HER2 COPY NUMBER PER CELL 3.10  Specimen      Other Problems Davy Pique Bynum, CMA; 11/08/2015 11:15 AM) Anxiety Disorder Back Pain Depression Diabetes Mellitus Hypercholesterolemia Lump In Breast Myocardial infarction Thyroid Disease  Past Surgical History Marjean Donna, CMA; 11/08/2015 11:15 AM) Breast Biopsy Left. Colon Polyp Removal - Colonoscopy Coronary Artery Bypass Graft Foot Surgery Left. Hysterectomy (not due to cancer) - Complete Oral Surgery  Diagnostic Studies History Marjean Donna, CMA; 11/08/2015 11:15 AM) Colonoscopy 5-10 years ago Mammogram within last year Pap Smear 1-5 years ago  Medication History Marjean Donna, CMA; 11/08/2015 11:16 AM) Amitriptyline HCl (50MG Tablet, Oral) Active. Atorvastatin Calcium (40MG Tablet, Oral) Active. Levothyroxine Sodium (88MCG Tablet, Oral) Active. Losartan Potassium-HCTZ (100-12.5MG Tablet, Oral) Active. MetFORMIN HCl (500MG Tablet, Oral)  Active. Metoprolol Tartrate (50MG Tablet, Oral) Active. Nitroglycerin (0.4MG Tab Sublingual, Sublingual) Active. Boniva (150MG Tablet, Oral) Active. Aspirin (81MG Tablet Chewable, Oral) Active. Medications Reconciled  Social History Marjean Donna, CMA; 11/08/2015 11:15 AM) Caffeine use Coffee, Tea. No alcohol use No drug use Tobacco use Former smoker.  Family History Marjean Donna, [place]; 11/08/2015 11:15 AM) Alcohol Abuse Brother. Arthritis Mother. Diabetes Mellitus Father. Heart Disease Father. Heart disease in female family member before age 46 Respiratory Condition Sister. Thyroid problems Mother, Sister.  Pregnancy / Birth History Marjean Donna, [employer]; 11/08/2015 11:15 AM) Age at menarche 53 years. Age of menopause 57-50 Gravida 2 Irregular periods Maternal age 59-20 Para 2     Review of Systems ([place]; 11/08/2015 11:15 AM) General Not Present- Appetite Loss, Chills, Fatigue, Fever, Night Sweats, Weight Gain and Weight Loss. Skin Not Present- Change in Wart/Mole, Dryness, Hives, Jaundice, New Lesions, Non-Healing Wounds, Rash and Ulcer. HEENT Present- Wears glasses/contact lenses. Not Present- Earache, Hearing Loss, Hoarseness, Nose Bleed, Oral Ulcers, Ringing in the Ears, Seasonal Allergies, Sinus Pain, Sore Throat, Visual Disturbances and Yellow Eyes. Respiratory Not Present- Bloody sputum, Chronic Cough, Difficulty Breathing, Snoring and Wheezing. Breast Present- Breast Mass. Not Present- Breast Pain, Nipple Discharge and Skin Changes. Cardiovascular Not Present- Chest Pain, Difficulty Breathing Lying Down, Leg Cramps, Palpitations, Rapid Heart Rate, Shortness of Breath and Swelling of Extremities. Gastrointestinal Not Present- Abdominal Pain, Bloating, Bloody Stool, Change in Bowel Habits, Chronic diarrhea, Constipation, Difficulty Swallowing, Excessive gas, Gets full quickly at meals, Hemorrhoids, Indigestion, Nausea, Rectal Pain and  Vomiting. Female Genitourinary Not Present- Frequency, Nocturia, Painful Urination, Pelvic Pain and Urgency. Musculoskeletal Present- Back Pain. Not Present- Joint Pain, Joint Stiffness, Muscle Pain, Muscle Weakness and Swelling of Extremities. Neurological Not Present- Decreased Memory, Fainting, Headaches, Numbness, Seizures, Tingling, Tremor, Trouble walking and Weakness. Psychiatric Present- Depression. Not Present- Anxiety, Bipolar, Change in Sleep Pattern, Fearful and Frequent crying. Endocrine Present- New Diabetes. Not Present- Cold Intolerance, Excessive Hunger, Hair  Changes, Heat Intolerance and Hot flashes. Hematology Not Present- Easy Bruising, Excessive bleeding, Gland problems, HIV and Persistent Infections.  Vitals (Sonya Bynum CMA; 11/08/2015 11:15 AM) 11/08/2015 11:15 AM Weight: 168 lb Height: 62in Body Surface Area: 1.77 m Body Mass Index: 30.73 kg/m  Temp.: 62F(Temporal)  Pulse: 76 (Regular)  BP: 136/78 (Sitting, Left Arm, Standard)      Physical Exam Rodman Key K. Tanika Bracco MD; 11/09/2015 10:03 AM)  The physical exam findings are as follows: Note:WDWN in NAD HEENT: EOMI, sclera anicteric Neck: No masses, no thyromegaly; no palpable lymphadenopathy Lungs: CTA bilaterally; normal respiratory effort Breasts: symmetric except for some left lateral bruising after biopsy No palpable masses; no palpable lympadenopathy No nipple retraction or discharge CV: Regular rate and rhythm; no murmurs Abd: +bowel sounds, soft, non-tender, no masses Ext: Well-perfused; no edema Skin: Warm, dry; no sign of jaundice    Assessment & Plan Rodman Key K. Tremar Wickens MD; 11/09/2015 10:06 AM)  INVASIVE DUCTAL CARCINOMA OF BREAST, LEFT (C50.912)  Current Plans We discussed surgical options for her breast cancer. We discussed mastectomy vs. breast conserving therapy. We discussed the likely need for radiation therapy after breast conserving therapy. She may be receiving radiation for  her right lung cancer. Much of this decision making is depending on her lung biopsy and the subsequent treatment plan. We discussed the prognostic panel, which actually appears fairly favorable. We will discuss this further with her after her lung biopsy.  Schedule for Surgery - Left radioactive seed localized lumpectomy/ left axillary sentinel lymph node biopsy. The surgical procedure has been discussed with the patient. Potential risks, benefits, alternative treatments, and expected outcomes have been explained. All of the patient's questions at this time have been answered. The likelihood of reaching the patient's treatment goal is good. The patient understand the proposed surgical procedure and wishes to proceed.  We will await the results of her lung biopsy prior to scheduling surgery.  Pt Education - [facility] Breast Cancer Information Given - [facility] "Breast Journey" Package  Denise Mckenzie. Georgette Dover, MD, [facility] Surgery  General/ Trauma Surgery  11/09/2015 10:07 AM

## 2015-11-19 NOTE — Anesthesia Procedure Notes (Addendum)
Anesthesia Regional Block:  Pectoralis block  Pre-Anesthetic Checklist: ,, timeout performed, Correct Patient, Correct Site, Correct Laterality, Correct Procedure, Correct Position, site marked, Risks and benefits discussed,  Surgical consent,  Pre-op evaluation,  At surgeon's request and post-op pain management  Laterality: Left  Prep: Maximum Sterile Barrier Precautions used and chloraprep       Needles:  Injection technique: Single-shot  Needle Type: Echogenic Stimulator Needle     Needle Length: 10cm 10 cm Needle Gauge: 21 and 21 G    Additional Needles:  Procedures: ultrasound guided (picture in chart) Pectoralis block Narrative:  Injection made incrementally with aspirations every 5 mL.  Performed by: Personally   Additional Notes: Risks, benefits and alternative to block explained extensively.  Patient tolerated procedure well, without complications.   Procedure Name: LMA Insertion Date/Time: 11/19/2015 12:31 PM Performed by: Toula Moos L Pre-anesthesia Checklist: Patient identified, Emergency Drugs available, Suction available, Patient being monitored and Timeout performed Patient Re-evaluated:Patient Re-evaluated prior to inductionOxygen Delivery Method: Circle System Utilized Preoxygenation: Pre-oxygenation with 100% oxygen Intubation Type: IV induction Ventilation: Mask ventilation without difficulty LMA: LMA inserted LMA Size: 4.0 Number of attempts: 1 Airway Equipment and Method: Bite block Placement Confirmation: positive ETCO2 Tube secured with: Tape Dental Injury: Teeth and Oropharynx as per pre-operative assessment

## 2015-11-20 ENCOUNTER — Encounter (HOSPITAL_BASED_OUTPATIENT_CLINIC_OR_DEPARTMENT_OTHER): Payer: Self-pay | Admitting: Surgery

## 2015-11-21 ENCOUNTER — Encounter: Payer: Self-pay | Admitting: Oncology

## 2015-11-21 ENCOUNTER — Telehealth: Payer: Self-pay | Admitting: Oncology

## 2015-11-21 ENCOUNTER — Ambulatory Visit: Payer: Self-pay | Admitting: Surgery

## 2015-11-21 ENCOUNTER — Telehealth: Payer: Self-pay | Admitting: *Deleted

## 2015-11-21 NOTE — Telephone Encounter (Signed)
Spoke with patient re new patient appointment with GM 11/27/15 @ 4 pm to arrive 3:30 pm. Patient demographic and insurance information confirmed. Moved from MM to GM.

## 2015-11-21 NOTE — Telephone Encounter (Signed)
Oncology Nurse Navigator Documentation  Oncology Nurse Navigator Flowsheets 11/21/2015  Navigator Encounter Type Telephone  Telephone Incoming Call  Treatment Phase Treatment  Barriers/Navigation Needs Education  Education Other  Acuity Level 1  Acuity Level 1 Minimal follow up required  Time Spent with Patient 15   Patient called and left me a vm message to call.  I called her back.  She stated she was confused about upcoming appt.  I explained next steps.  She was thankful for the call back.

## 2015-11-21 NOTE — H&P (Signed)
Expand All Collapse All    History of Present Illness Denise Mckenzie. Denise Alexa MD;  Patient words: breast eval.    PCP - Dr. Sharyn Lull Collins/ Dr. Lorene Dy Cardiothoracic - Dr. Ceasar Mons Oncology - Dr. Julien Nordmann  Reason for consultation - new left breast cancer  This is a 70 yo female who is being worked up for a right upper lung mass noted on chest x-ray. CT/PET showed an intensely hypermetabolic 1.4 cm right upper lobe pulmonary nodule with no sign of thoracic nodal disease. These findings are consistent with stage 1A primary bronchogenic carcinoma. She is scheduled to under go biopsy of this lesion on 11/13/15. The PET/CT also showed an incidental finding of a hypermetabolic 1.0 cm mass in her posterior left breast. There have been some questionable findings in this area on recent mammograms and she has been followed closely with q6 month studies. She underwent mammogram and ultrasound, as well as core biopsy, which revealed an invasive ductal carcinoma. The lymph node core biopsy was negative.  On 11/19/15, the patient underwent left radioactive seed localized lumpectomy and sentinel lymph node biopsy.  The SLN showed 0/4 lymph nodes positive.  The lumpectomy showed 1.2 cm invasive ductal carcinoma with positive inferior margin.  Otherwise she is doing well.  The patient denies any family history or breast problems. The only family history was a maternal aunt who was diagnosed with breast cancer in her 63's Menarche - 1 First pregnancy - 18 Breastfeed - no Oral contraceptives - 7 months - became pregnant on OCP No other hormones  CLINICAL DATA: Initial treatment strategy for right upper lobe pulmonary nodule.  EXAM: NUCLEAR MEDICINE PET SKULL BASE TO THIGH  TECHNIQUE: 8.6 mCi F-18 FDG was injected intravenously. Full-ring PET imaging was performed from the skull base to thigh after the radiotracer. CT data was obtained and used for attenuation correction and  anatomic localization.  FASTING BLOOD GLUCOSE: Value: 114 mg/dl  COMPARISON: 09/11/2015 chest CT.  FINDINGS: NECK  No hypermetabolic lymph nodes in the neck. Subcentimeter coarse calcification in the right thyroid lobe. No hypermetabolic thyroid nodules.  CHEST  There is an intensely hypermetabolic 1.4 x 1.2 cm right upper lobe pulmonary nodule (series 8/image 21) with max SUV 24.8.  No hypermetabolic axillary, mediastinal or hilar nodes. Mild centrilobular and paraseptal emphysema with diffuse bronchial wall thickening. No acute consolidative airspace disease. There are at least 4 scattered tiny pulmonary nodules throughout the right lung, largest 4 mm in the anterior right upper lobe (series 8/image 32), which are below PET resolution and are nonspecific, probably benign. Extensive coronary atherosclerosis status post CABG with left internal mammary and ascending aortic bypass grafts.  There is a hypermetabolic 1.0 cm soft tissue nodule in the far posterior left breast (series 5/image 76) with max SUV 4.1.  ABDOMEN/PELVIS  No abnormal hypermetabolic activity within the liver, pancreas, adrenal glands, or spleen. No hypermetabolic lymph nodes in the abdomen or pelvis. Non hypermetabolic 2.5 cm right adrenal mass with density 9 HU, in keeping with a benign adenoma. Ectasia of the atherosclerotic infrarenal abdominal aorta, maximum diameter 2.5 cm. Extensive subcutaneous soft tissue calcifications in the bilateral gluteal regions, most prominent overlying the right greater trochanter.  SKELETON  No focal hypermetabolic activity to suggest skeletal metastasis.  IMPRESSION: 1. Intensely hypermetabolic 1.4 cm right upper lobe pulmonary nodule, in keeping with a primary bronchogenic carcinoma. 2. No hypermetabolic thoracic nodal metastases. No hypermetabolic distant metastatic disease. PET-CT staging is stage IA (T1a N0 M0). 3.  Hypermetabolic 1.0 cm soft tissue nodule  in the far posterior left breast, which is nonspecific, cannot exclude a primary left breast malignancy. Recommend further evaluation with diagnostic mammography. 4. Additional findings include extensive coronary atherosclerosis status post CABG, mild emphysema with diffuse bronchial wall thickening suggesting COPD, right adrenal adenoma and ectatic atherosclerotic infrarenal abdominal aorta.   Electronically Signed By: Ilona Sorrel M.D. On: 10/11/2015 13:49  CLINICAL DATA: 70 year old female who recently had a PET study for a lung mass which showed a hypermetabolic 1 cm soft tissue nodule in the far posterior left breast.  EXAM: DIGITAL DIAGNOSTIC LEFT MAMMOGRAM WITH 3D TOMOSYNTHESIS WITH CAD  ULTRASOUND LEFT BREAST  COMPARISON: Previous exam(s).  ACR Breast Density Category b: There are scattered areas of fibroglandular density.  FINDINGS: Far posteriorly in the upper-outer quadrant of the left breast is a 10 mm spiculated mass. No additional masses are seen in the left breast. There are no malignant type microcalcifications.  Mammographic images were processed with CAD.  On physical exam, I do not palpate a mass in the upper-outer quadrant of the left breast or the axilla.  Targeted ultrasound is performed, showing a spiculated hypoechoic mass in the left breast at 2 o'clock 3 cm from the nipple measure 8 x 8 x 10 mm. Sonographic evaluation of the left axilla shows a 1.2 cm lymph node with a prominent cortex.  IMPRESSION: Suspicious mass in the 2 o'clock region of the left breast and suspicious left axillary lymph node.  RECOMMENDATION: Ultrasound-guided core biopsies of the left breast mass and left axillary lymph node is recommended. This will be scheduled at the patient's convenience.  I have discussed the findings and recommendations with the patient. Results were also provided in writing at the conclusion of the visit. If applicable, a reminder letter  will be sent to the patient regarding the next appointment.  BI-RADS CATEGORY 5: Highly suggestive of malignancy.   Electronically Signed By: Lillia Mountain M.D. On: 10/28/2015 16:10  CLINICAL DATA: 70 year old female with history of lung cancer and a mass in the left breast identified on a PET-CT presents for ultrasound-guided biopsy of this left breast mass and a left axillary lymph nodes.  EXAM: ULTRASOUND GUIDED LEFT BREAST CORE NEEDLE BIOPSY  COMPARISON: Previous exam(s).  FINDINGS: I met with the patient and we discussed the procedure of ultrasound-guided biopsy, including benefits and alternatives. We discussed the high likelihood of a successful procedure. We discussed the risks of the procedure, including infection, bleeding, tissue injury, clip migration, and inadequate sampling. Informed written consent was given. The usual time-out protocol was performed immediately prior to the procedure.  Using sterile technique and 1% Lidocaine as local anesthetic, under direct ultrasound visualization, a 14 Gauge spring-loaded device was used to perform biopsy of a mass in the upper-outer quadrant of the left breast using a lateral approach. At the conclusion of the procedure a ribbon shaped tissue marker clip was deployed into the biopsy cavity.  Using sterile technique and 1% Lidocaine as local anesthetic, under direct ultrasound visualization, a 14 gauge spring-loaded device was used to perform biopsy of left axillary lymph node using a lateral approach. At the conclusion of the procedure a HydroMARK tissue marker clip was deployed into the biopsy cavity. Follow up 2 view mammogram was performed and dictated separately.  IMPRESSION: Ultrasound guided biopsy of a mass in the upper outer quadrant of the left breast and a left axillary lymph node. No apparent complications.   Electronically Signed By: Ammie Ferrier M.D.  On: 11/04/2015 15:26  Diagnosis 1.  Breast, left, needle core biopsy, upper outer quadrant - INVASIVE DUCTAL CARCINOMA. - SEE COMMENT. 2. Lymph node, needle/core biopsy, left axilla - THERE IS NO EVIDENCE OF CARCINOMA IN 1 OF 1 LYMPH NODE (0/1). - SEE COMMENT. Microscopic Comment 1. and 2. The carcinoma in part 1 appears grade 2. A breast prognostic profile will be performed and the results reported separately. The results were called to [facility] on 11/05/15. (JBK:gt, 11/05/15) Enid Cutter MD Pathologist, Electronic Signature (Case signed 11/05/2015)  ADDITIONAL INFORMATION: 1. PROGNOSTIC INDICATORS Results: IMMUNOHISTOCHEMICAL AND MORPHOMETRIC ANALYSIS PERFORMED MANUALLY Estrogen Receptor: 100%, POSITIVE, STRONG STAINING INTENSITY Progesterone Receptor: 100%, POSITIVE, STRONG STAINING INTENSITY Proliferation Marker Ki67: 6% REFERENCE RANGE ESTROGEN RECEPTOR NEGATIVE 0% POSITIVE =>1% REFERENCE RANGE PROGESTERONE RECEPTOR NEGATIVE 0% POSITIVE =>1% All controls stained appropriately Enid Cutter MD Pathologist, Electronic Signature ( Signed 11/07/2015) 1. FLUORESCENCE IN-SITU HYBRIDIZATION Results: HER2 - NEGATIVE RATIO OF HER2/CEP17 SIGNALS 1.32 AVERAGE HER2 COPY NUMBER PER CELL 3.10  Specimen      Other Problems  Anxiety Disorder Back Pain Depression Diabetes Mellitus Hypercholesterolemia Lump In Breast Myocardial infarction Thyroid Disease  Past Surgical History Breast Biopsy Left. Colon Polyp Removal - Colonoscopy Coronary Artery Bypass Graft Foot Surgery Left. Hysterectomy (not due to cancer) - Complete Oral Surgery  Diagnostic Studies History Colonoscopy 5-10 years ago Mammogram within last year Pap Smear 1-5 years ago  Medication History  Amitriptyline HCl (50MG Tablet, Oral) Active. Atorvastatin Calcium (40MG Tablet, Oral) Active. Levothyroxine Sodium (88MCG Tablet, Oral) Active. Losartan Potassium-HCTZ (100-12.5MG Tablet, Oral)  Active. MetFORMIN HCl (500MG Tablet, Oral) Active. Metoprolol Tartrate (50MG Tablet, Oral) Active. Nitroglycerin (0.4MG Tab Sublingual, Sublingual) Active. Boniva (150MG Tablet, Oral) Active. Aspirin (81MG Tablet Chewable, Oral) Active. Medications Reconciled  Social History  Caffeine use Coffee, Tea. No alcohol use No drug use Tobacco use Former smoker.  Family History  Alcohol Abuse Brother. Arthritis Mother. Diabetes Mellitus Father. Heart Disease Father. Heart disease in female family member before age 49 Respiratory Condition Sister. Thyroid problems Mother, Sister.  Pregnancy / Birth History Age at menarche 45 years. Age of menopause 19-50 Gravida 2 Irregular periods Maternal age 68-20 Para 2     Review of Systems General Not Present- Appetite Loss, Chills, Fatigue, Fever, Night Sweats, Weight Gain and Weight Loss. Skin Not Present- Change in Wart/Mole, Dryness, Hives, Jaundice, New Lesions, Non-Healing Wounds, Rash and Ulcer. HEENT Present- Wears glasses/contact lenses. Not Present- Earache, Hearing Loss, Hoarseness, Nose Bleed, Oral Ulcers, Ringing in the Ears, Seasonal Allergies, Sinus Pain, Sore Throat, Visual Disturbances and Yellow Eyes. Respiratory Not Present- Bloody sputum, Chronic Cough, Difficulty Breathing, Snoring and Wheezing. Breast Present- Breast Mass. Not Present- Breast Pain, Nipple Discharge and Skin Changes. Cardiovascular Not Present- Chest Pain, Difficulty Breathing Lying Down, Leg Cramps, Palpitations, Rapid Heart Rate, Shortness of Breath and Swelling of Extremities. Gastrointestinal Not Present- Abdominal Pain, Bloating, Bloody Stool, Change in Bowel Habits, Chronic diarrhea, Constipation, Difficulty Swallowing, Excessive gas, Gets full quickly at meals, Hemorrhoids, Indigestion, Nausea, Rectal Pain and Vomiting. Female Genitourinary Not Present- Frequency, Nocturia, Painful Urination, Pelvic Pain and  Urgency. Musculoskeletal Present- Back Pain. Not Present- Joint Pain, Joint Stiffness, Muscle Pain, Muscle Weakness and Swelling of Extremities. Neurological Not Present- Decreased Memory, Fainting, Headaches, Numbness, Seizures, Tingling, Tremor, Trouble walking and Weakness. Psychiatric Present- Depression. Not Present- Anxiety, Bipolar, Change in Sleep Pattern, Fearful and Frequent crying. Endocrine Present- New Diabetes. Not Present- Cold Intolerance, Excessive Hunger, Hair Changes, Heat Intolerance and Hot flashes.  Hematology Not Present- Easy Bruising, Excessive bleeding, Gland problems, HIV and Persistent Infections.  Vitals  Weight: 168 lb Height: 62in Body Surface Area: 1.77 m Body Mass Index: 30.73 kg/m  Temp.: 44F(Temporal)  Pulse: 76 (Regular)  BP: 136/78 (Sitting, Left Arm, Standard)      Physical Exam  The physical exam findings are as follows: Note:WDWN in NAD HEENT: EOMI, sclera anicteric Neck: No masses, no thyromegaly; no palpable lymphadenopathy Lungs: CTA bilaterally; normal respiratory effort Breasts: symmetric except for some left lateral bruising after biopsy No palpable masses; no palpable lympadenopathy No nipple retraction or discharge CV: Regular rate and rhythm; no murmurs Abd: +bowel sounds, soft, non-tender, no masses Ext: Well-perfused; no edema Skin: Warm, dry; no sign of jaundice    Assessment & Plan Rodman Key K. Sholonda Jobst MD; 11/09/2015 10:06 AM)  INVASIVE DUCTAL CARCINOMA OF BREAST, LEFT (C50.912) Positive inferior margin Negative sentinel lymph nodes  Current Plans Invasive ductal carcinoma - left breast with positive inferior margins.  Recommend reexcision of inferior margin. The surgical procedure has been discussed with the patient. Potential risks, benefits, alternative treatments, and expected outcomes have been explained. All of the patient's questions at this time have been answered. The likelihood of reaching the  patient's treatment goal is good. The patient understand the proposed surgical procedure and wishes to proceed.       Denise Mckenzie. Georgette Dover, MD, [facility]  General/ Trauma Surgery  11/21/2015 3:54 PM

## 2015-11-22 ENCOUNTER — Telehealth: Payer: Self-pay | Admitting: *Deleted

## 2015-11-22 ENCOUNTER — Other Ambulatory Visit: Payer: Self-pay | Admitting: Oncology

## 2015-11-22 NOTE — Telephone Encounter (Signed)
Received appt date/time from Denise Mckenzie.  Unable to mail packet.  Placed a note for an intake form to be given to the pt at time of check in.

## 2015-11-25 ENCOUNTER — Ambulatory Visit: Payer: Medicare Other | Admitting: Oncology

## 2015-11-27 ENCOUNTER — Other Ambulatory Visit (HOSPITAL_BASED_OUTPATIENT_CLINIC_OR_DEPARTMENT_OTHER): Payer: Medicare Other

## 2015-11-27 ENCOUNTER — Ambulatory Visit (HOSPITAL_BASED_OUTPATIENT_CLINIC_OR_DEPARTMENT_OTHER): Payer: Medicare Other | Admitting: Oncology

## 2015-11-27 VITALS — BP 153/67 | HR 71 | Temp 98.6°F | Resp 18 | Ht 62.0 in | Wt 169.4 lb

## 2015-11-27 DIAGNOSIS — Z170 Estrogen receptor positive status [ER+]: Secondary | ICD-10-CM | POA: Diagnosis not present

## 2015-11-27 DIAGNOSIS — R918 Other nonspecific abnormal finding of lung field: Secondary | ICD-10-CM

## 2015-11-27 DIAGNOSIS — C50412 Malignant neoplasm of upper-outer quadrant of left female breast: Secondary | ICD-10-CM

## 2015-11-27 DIAGNOSIS — C3411 Malignant neoplasm of upper lobe, right bronchus or lung: Secondary | ICD-10-CM

## 2015-11-27 DIAGNOSIS — R911 Solitary pulmonary nodule: Secondary | ICD-10-CM

## 2015-11-27 LAB — CBC WITH DIFFERENTIAL/PLATELET
BASO%: 0.4 % (ref 0.0–2.0)
BASOS ABS: 0 10*3/uL (ref 0.0–0.1)
EOS ABS: 0.3 10*3/uL (ref 0.0–0.5)
EOS%: 2.8 % (ref 0.0–7.0)
HEMATOCRIT: 44.2 % (ref 34.8–46.6)
HGB: 14.8 g/dL (ref 11.6–15.9)
LYMPH%: 21.3 % (ref 14.0–49.7)
MCH: 30.5 pg (ref 25.1–34.0)
MCHC: 33.5 g/dL (ref 31.5–36.0)
MCV: 91.1 fL (ref 79.5–101.0)
MONO#: 0.8 10*3/uL (ref 0.1–0.9)
MONO%: 6.8 % (ref 0.0–14.0)
NEUT#: 7.5 10*3/uL — ABNORMAL HIGH (ref 1.5–6.5)
NEUT%: 68.7 % (ref 38.4–76.8)
NRBC: 0 % (ref 0–0)
PLATELETS: 220 10*3/uL (ref 145–400)
RBC: 4.85 10*6/uL (ref 3.70–5.45)
RDW: 14.3 % (ref 11.2–14.5)
WBC: 11 10*3/uL — ABNORMAL HIGH (ref 3.9–10.3)
lymph#: 2.3 10*3/uL (ref 0.9–3.3)

## 2015-11-27 LAB — COMPREHENSIVE METABOLIC PANEL
ALT: 16 U/L (ref 0–55)
ANION GAP: 7 meq/L (ref 3–11)
AST: 18 U/L (ref 5–34)
Albumin: 3.6 g/dL (ref 3.5–5.0)
Alkaline Phosphatase: 90 U/L (ref 40–150)
BUN: 10.7 mg/dL (ref 7.0–26.0)
CHLORIDE: 99 meq/L (ref 98–109)
CO2: 33 meq/L — AB (ref 22–29)
CREATININE: 0.9 mg/dL (ref 0.6–1.1)
Calcium: 9.7 mg/dL (ref 8.4–10.4)
EGFR: 69 mL/min/{1.73_m2} — ABNORMAL LOW (ref >90)
Glucose: 88 mg/dl (ref 70–140)
Potassium: 3.6 mEq/L (ref 3.5–5.1)
SODIUM: 139 meq/L (ref 136–145)
Total Bilirubin: 0.39 mg/dL (ref 0.20–1.20)
Total Protein: 7.3 g/dL (ref 6.4–8.3)

## 2015-11-27 NOTE — Progress Notes (Signed)
[place]  [phone] [phone]     ID: Denise Mckenzie DOB: May 28, 1946  MR#: [id number]  QPY#:[id number]  Patient Care Team: Denise Dy, MD as PCP - General (Internal Medicine) Denise Isaac, MD as Consulting Physician (Cardiothoracic Surgery) Denise Mesa, MD as Consulting Physician (General Surgery) Denise Silversmith, MD as Consulting Physician (Radiation Oncology) Denise Cruel, MD as Consulting Physician (Oncology) Denise Silversmith, MD as Consulting Physician (Radiation Oncology) Denise Bears, MD as Consulting Physician (Oncology) PCP: Denise Jacobson, MD GYN: OTHER MD:  CHIEF COMPLAINT: Lung cancer, breast cancer  CURRENT TREATMENT: Awaiting definitive surgery   BREAST AND LUNG CANCER PRESENTING HISTORY:  LUNG CANCER Denise Mckenzie developed some cough early January 2017 and brought it to her primary care physician's attention. A chest x-ray was obtained 09/01/2014. This showed a vague lingular density measuring 1.4 cm. Further evaluation with a CT scan of the chest obtained 09/11/2015 found a 1.1 cm right upper lobe nodule. The area in the lingular density was felt to be scarring. There were other scattered nodules too small to characterize. There was no adenopathy and no bony lesions.  Accordingly on 10/11/2015 the patient underwent PET scan. The right upper lobe lung lesion was hypermetabolic, with an SUV max of 24.8. It measured 1.4 cm on the PET scan. At least for small pulmonary nodules were also again noted, the largest measuring 4 mm, all below PET resolution. There were felt to be likely benign. Incidentally a hypermetabolic 1 cm soft tissue nodule in the left breast was noted, with an SUV max of 4.1. A right adrenal mass measuring 2.5 cm was not hypermetabolic, consistent with a benign adenoma. CT scan of the head with and without contrast 10/22/2015 was benign  The patient's case was presented at the lung cancer conference.  Dr. Servando Snare comments that the patient's diffusion capacity was 28% and the FEV 1 was 47% of predicted, limiting surgical choices. Accordingly on 11/13/2015 the patient underwent video bronchoscopy with endobronchial navigation and right lung biopsy with marker placement The pathology from this procedure ([id number]) showed only blood clot and scant mucosal tissue. It was not diagnostic that it appears to be clearly discordant given the earlier PET results. The patient is scheduled for S ROS for definitive treatment of the lung lesion.  BREAST CANCER The incidentally noted left breast mass was further evaluated with bilateral diagnostic mammography with tomography and [facility]28[facility]. This found a breast density to be category be. In the upper-outer quadrant there was a 1.0 cm spiculated mass which was not palpable. Ultrasonography confirmed a spiculated hypoechoic mass at the 2:00 position of the left breast, 3 cm from the nipple, measuring 1.0 cm. In the left axilla there was a 1.2 cm lymph node with a prominent cortex.  Biopsy of the left breast mass in question 11/04/2015 showed ([id number]) invasive ductal carcinoma, grade 2, estrogen receptor 100% positive, progesterone receptor 100% positive, both with strong staining intensity, with an Denise Mckenzie of 6%, and no HER-2 amplification, the signals ratio being 1.32 and the number per cell 3.10. The suspicious left axillary lymph node was biopsied at the same time and it showed no evidence of carcinoma. This was felt to be concordant.  On 11/19/2015 Denise Mckenzie underwent left lumpectomy with sentinel lymph node sampling. The final pathology ([id number]) confirmed an invasive ductal carcinoma, grade 2, measuring 1.2 cm. Invasive disease was focally present at the inferior margin. In situ disease was less  than 0.1 cm to the inferior margin as well. All 4 sentinel lymph nodes were clear. Repeat HER-2 was again negative with a  signals ratio of 1.43, the number per cell being 3.50.  Her subsequent history is as detailed below.  INTERVAL HISTORY: Denise Mckenzie was evaluated in the breast clinic for 08/05/2016 accompanied by her niece Denise Mckenzie. The patient has already met with Dr. Servando Snare and Earlie Server regarding her lung cancer and will meet with Dr. Pablo Ledger shortly regarding radiation options.  REVIEW OF SYSTEMS: [name] tolerated her surgery well. She is concerned about having to have yet more surgery next week but understands the need for it. She is frustrated that she continues to gain weight. She tells me even though her pulmonary function tests were very poor her own interpretation is that things are not so bad and that she was probably just a bit anxious that day. She sleeps poorly. She needs new glasses. She has a full set of dentures and she says that it fairly well. She sleeps on 2 pillows. She does have arthritis and joint pains here and there but these are not more intense or persistent than before. The do keep her from walking what much". She admits to being depressed as well as anxious. She thinks her blood sugars are well controlled but she has not been checking them lately. A detailed review of systems today was otherwise stable  Past Medical History  Diagnosis Date  . Coronary artery disease   . Hyperlipidemia   . Hypertension   . Hypothyroidism 10/22/2015  . Osteoporosis 10/22/2015  . Diabetes mellitus ([place]) 10/22/2015  . Myocardial infarction [facility]) July 1995  . COPD (chronic obstructive pulmonary disease) ([place])   . Depression   . Arthritis   . Cancer ([place])     lung cancer  . Full dentures     full upper dentures, plate on bottom    PAST SURGICAL HISTORY: Past Surgical History  Procedure Laterality Date  . Coronary artery bypass graft      11/1994  . Abdominal hysterectomy      1968  . 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 Bladder surgery      1972  . Tmj  arthroplasty      1979  . Tonsillectomy    . Cardiac catheterization  1995, 1996    Dr. Wynonia Lawman saw here then here at [facility] both times; 'they did a balloon and opened it up' first time, the 2nd was scheduled d/t abnormal stress test  . Appendectomy      with hysterectomy  . Colonoscopy w/ polypectomy    . Multiple tooth extractions    . Video bronchoscopy with endobronchial navigation N/A 11/13/2015    Procedure: VIDEO BRONCHOSCOPY WITH ENDOBRONCHIAL NAVIGATION, with placement of fudicial markers;  Surgeon: Denise Isaac, MD;  Location: [facility] OR;  Service: Thoracic;  Laterality: N/A;  . Lung biopsy N/A 11/13/2015    Procedure: RIGHT LUNG BIOPSY;  Surgeon: Denise Isaac, MD;  Location: [facility] OR;  Service: Thoracic;  Laterality: N/A;  . Radioactive seed guided mastectomy with axillary sentinel lymph node biopsy Left 11/19/2015    Procedure: RADIOACTIVE SEED GUIDED PARTIAL MASTECTOMY WITH AXILLARY SENTINEL LYMPH NODE BIOPSY;  Surgeon: Denise Mesa, MD;  Location: [place];  Service: General;  Laterality: Left;    FAMILY HISTORY Family History  Problem Relation Age of Onset  . Alzheimer's disease Mother   .  Heart attack Father   The patient's father died from a heart attack at age 57. The patient's mother died with Alzheimer's disease at age 49. Joury has one brother and one sister. The only cancer in the family was breast cancer in a maternal aunt diagnosed in her early 4s.  GYNECOLOGIC HISTORY:  No LMP recorded. Patient has had a hysterectomy. Menarche age 34, first live birth age 82. The patient is GX P2. She underwent total abdominal hysterectomy with bilateral salpingo-oophorectomy 1968. She did not take hormone replacement.  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 Denise Mckenzie lives in [place]. The patient's daughter Sherri Rad lives in  [place] and works in Programmer, applications. The patient has 3 grandchildren and 2 great-grandchildren. She attends a [employer] in [place]: In place   HEALTH MAINTENANCE: Social History  Substance Use Topics  . Smoking status: Former Smoker    Quit date: 08/17/1994  . Smokeless tobacco: Not on file  . Alcohol Use: No     Colonoscopy:2010/[facility]  PAP: 2015  Bone density: 2016/[facility]  Lipid panel:  No Known Allergies  Current Outpatient Prescriptions  Medication Sig Dispense Refill  . acetaminophen (TYLENOL) 500 MG tablet Take 1,000 mg by mouth 2 (two) times daily as needed (pain).    Marland Kitchen amitriptyline (ELAVIL) 50 MG tablet Take 50 mg by mouth at bedtime.      Marland Kitchen aspirin EC 325 MG tablet Take 325 mg by mouth daily.    Marland Kitchen atorvastatin (LIPITOR) 40 MG tablet Take 40 mg by mouth at bedtime.     . Calcium Citrate-Vitamin D (CALCIUM + D PO) Take 2 tablets by mouth 3 (three) times daily. Each tablet Calcium 300 mg, Vitamin D3 250 I.U.    . HYDROcodone-acetaminophen (NORCO/VICODIN) 5-325 MG tablet Take 1 tablet by mouth every 4 (four) hours as needed. (Patient taking differently: Take 1 tablet by mouth every 4 (four) hours as needed for moderate pain. ) 40 tablet 0  . ibandronate (BONIVA) 150 MG tablet Take 150 mg by mouth every 30 (thirty) days. On the 4th Monday of each month  4  . Krill Oil 1000 MG CAPS Take 1,000 mg by mouth daily.    Marland Kitchen levothyroxine (SYNTHROID, LEVOTHROID) 88 MCG tablet Take 88 mcg by mouth daily before breakfast.     . losartan-hydrochlorothiazide (HYZAAR) 100-12.5 MG per tablet Take 1 tablet by mouth daily.      . metFORMIN (GLUCOPHAGE) 500 MG tablet Take 500 mg by mouth daily.  4  . metoprolol (LOPRESSOR) 50 MG tablet Take 25 mg by mouth 2 (two) times daily.      . nitroGLYCERIN (NITROSTAT) 0.4 MG SL tablet Place 0.4 mg under the tongue every 5 (five) minutes as needed for chest pain.      No current facility-administered medications for  this visit.    OBJECTIVE: Middle-aged white woman who appears stated age 84 Vitals:   11/27/15 1605  BP: 153/67  Pulse: 71  Temp: 98.6 F (37 C)  Resp: 18     Body mass index is 30.98 kg/(m^2).    ECOG FS:1 - Symptomatic but completely ambulatory  Ocular: Sclerae unicteric, EOMs intact Ear-nose-throat: Oropharynx clear and moist; full upper and lower plates Lymphatic: No cervical or supraclavicular adenopathy Lungs no rales or rhonchi, good excursion bilaterally Heart regular rate and  rhythm, no murmur appreciated Abd soft, nontender, positive bowel sounds MSK no focal spinal tenderness, no joint edema Neuro: non-focal, well-oriented, appropriate affect Breasts: The patient has a complex bandage in place and this was not on done today for evaluation. Both axillae are benign   LAB RESULTS:  CMP     Component Value Date/Time   NA 139 11/27/2015 1547   NA 133* 11/11/2015 1535   K 3.6 11/27/2015 1547   K 3.8 11/11/2015 1535   CL 95* 11/11/2015 1535   CO2 33* 11/27/2015 1547   CO2 26 11/11/2015 1535   GLUCOSE 88 11/27/2015 1547   GLUCOSE 95 11/11/2015 1535   BUN 10.7 11/27/2015 1547   BUN 8 11/11/2015 1535   CREATININE 0.9 11/27/2015 1547   CREATININE 0.77 11/11/2015 1535   CALCIUM 9.7 11/27/2015 1547   CALCIUM 9.8 11/11/2015 1535   PROT 7.3 11/27/2015 1547   PROT 7.5 11/11/2015 1535   ALBUMIN 3.6 11/27/2015 1547   ALBUMIN 4.2 11/11/2015 1535   AST 18 11/27/2015 1547   AST 19 11/11/2015 1535   ALT 16 11/27/2015 1547   ALT 19 11/11/2015 1535   ALKPHOS 90 11/27/2015 1547   ALKPHOS 98 11/11/2015 1535   BILITOT 0.39 11/27/2015 1547   BILITOT 0.4 11/11/2015 1535   GFRNONAA >60 11/11/2015 1535   GFRAA >60 11/11/2015 1535    INo results found for: SPEP, UPEP  Lab Results  Component Value Date   WBC 11.0* 11/27/2015   NEUTROABS 7.5* 11/27/2015   HGB 14.8 11/27/2015   HCT 44.2 11/27/2015   MCV 91.1 11/27/2015   PLT 220 11/27/2015      Chemistry        Component Value Date/Time   NA 139 11/27/2015 1547   NA 133* 11/11/2015 1535   K 3.6 11/27/2015 1547   K 3.8 11/11/2015 1535   CL 95* 11/11/2015 1535   CO2 33* 11/27/2015 1547   CO2 26 11/11/2015 1535   BUN 10.7 11/27/2015 1547   BUN 8 11/11/2015 1535   CREATININE 0.9 11/27/2015 1547   CREATININE 0.77 11/11/2015 1535      Component Value Date/Time   CALCIUM 9.7 11/27/2015 1547   CALCIUM 9.8 11/11/2015 1535   ALKPHOS 90 11/27/2015 1547   ALKPHOS 98 11/11/2015 1535   AST 18 11/27/2015 1547   AST 19 11/11/2015 1535   ALT 16 11/27/2015 1547   ALT 19 11/11/2015 1535   BILITOT 0.39 11/27/2015 1547   BILITOT 0.4 11/11/2015 1535       No results found for: LABCA2  No components found for: LABCA125  No results for input(s): INR in the last 168 hours.  Urinalysis No results found for: COLORURINE, APPEARANCEUR, LABSPEC, PHURINE, GLUCOSEU, HGBUR, BILIRUBINUR, KETONESUR, PROTEINUR, UROBILINOGEN, NITRITE, LEUKOCYTESUR    ELIGIBLE FOR AVAILABLE RESEARCH PROTOCOL: No  STUDIES: Dg Chest 2 View  11/11/2015  CLINICAL DATA:  Preproceduralchest x-ray. Diabetes. History of CABG. EXAM: CHEST  2 VIEW COMPARISON:  PET-CT 10/11/2015.  Chest x-ray 09/02/2015. FINDINGS: Mediastinum and hilar structures normal. Prior CABG. Heart size normal. Right upper lobe nodular lesion again noted. No pleural effusion or pneumothorax. IMPRESSION: 1. Persistent nodular density right upper lobe as noted on prior PET-CT. 2. Prior CABG.  Heart size normal. Electronically Signed   By: Marcello Moores  Register   On: 11/11/2015 16:19   Nm Sentinel Node Inj-no Rpt (breast)  11/19/2015  CLINICAL DATA: left invasive ductal carcinoma Sulfur colloid was injected intradermally by the nuclear medicine technologist for breast cancer  sentinel node localization.   Mm Breast Surgical Specimen  11/19/2015  CLINICAL DATA:  Left lumpectomy for breast cancer. EXAM: SPECIMEN RADIOGRAPH OF THE LEFT BREAST COMPARISON:  Previous exam(s).  FINDINGS: Status post excision of the left breast. The mass, radioactive seed and biopsy marker clip are present, completely intact, and were marked for pathology. IMPRESSION: Specimen radiograph of the left breast. Electronically Signed   By: Claudie Revering M.D.   On: 11/19/2015 13:33   Dg Chest Port 1 View  11/13/2015  CLINICAL DATA:  Status post lung biopsy. EXAM: PORTABLE CHEST 1 VIEW COMPARISON:  10/22/2015 FINDINGS: The patient is status post median sternotomy and CABG procedure. The heart size is normal. No airspace consolidation. Right upper lobe pulmonary nodule is again identified. There is no pneumothorax following bronchoscopy. IMPRESSION: No pneumothorax identified status post bronchoscopy and biopsy of right upper lobe lung nodule. Electronically Signed   By: Kerby Moors M.D.   On: 11/13/2015 13:23   Mm Digital Diagnostic Unilat L  11/18/2015  CLINICAL DATA:  Recent diagnosis of left breast cancer. The lesion is far posterior 2 o'clock position and best visualized on ultrasound. Therefore, localization with ultrasound is performed. EXAM: ULTRASOUND GUIDED RADIOACTIVE SEED LOCALIZATION OF THE LEFT BREAST COMPARISON:  Previous exam(s). FINDINGS: Patient presents for radioactive seed localization prior to lumpectomy. I met with the patient and we discussed the procedure of seed localization including benefits and alternatives. We discussed the high likelihood of a successful procedure. We discussed the risks of the procedure including infection, bleeding, tissue injury and further surgery. We discussed the low dose of radioactivity involved in the procedure. Informed, written consent was given. The usual time-out protocol was performed immediately prior to the procedure. Using ultrasound guidance, sterile technique, 1% lidocaine and an I-125 radioactive seed, an irregular mass with associated biopsy clip in the 2 o'clock position of the left breast, posterior was localized using a lateral to medial  approach. The follow-up mammogram image in the MLO projection confirms the seed in the expected location and was marked for Dr. Georgette Dover. A CC projection was not performed, as the mass was not seen on post clip images performed recently in the CC projection. Ultrasound pictures were also taken, confirming the seed within the mass. Follow-up survey of the patient confirms presence of the radioactive seed. Order number of I-125 seed:  [id number]. Total activity: 9.381 millicuries Reference Date: 08 November 2015 The patient tolerated the procedure well and was released from the [facility]. She was given instructions regarding seed removal. IMPRESSION: Radioactive seed localization left breast. No apparent complications. Electronically Signed   By: Curlene Dolphin M.D.   On: 11/18/2015 16:33   Mm Digital Diagnostic Unilat L  11/04/2015  CLINICAL DATA:  Post biopsy mammogram of the left breast for clip placement. EXAM: DIAGNOSTIC LEFT MAMMOGRAM POST ULTRASOUND BIOPSY COMPARISON:  Previous exam(s). FINDINGS: Mammographic images were obtained following ultrasound guided biopsy of left breast mass as well as a left axillary lymph node. Due to the far posterior location of the mass in the upper-outer left breast, the ribbon shaped biopsy marking clip clip can only be visualized on 1 of the true lateral views, but not on the CC orientation. The left axillary clip also is not visualized on the mammographic images. IMPRESSION: Due to the far posterior position of the left breast mass, it is visualized only in 1 of the true lateral views, and is appropriately positioned on this image. Final Assessment: Post Procedure Mammograms for Marker Placement  Electronically Signed   By: Ammie Ferrier M.D.   On: 11/04/2015 16:02   [place] Lt Radioactive Seed Loc  11/18/2015  CLINICAL DATA:  Recent diagnosis of left breast cancer. The lesion is far posterior 2 o'clock position and best visualized on ultrasound. Therefore, localization with  ultrasound is performed. EXAM: ULTRASOUND GUIDED RADIOACTIVE SEED LOCALIZATION OF THE LEFT BREAST COMPARISON:  Previous exam(s). FINDINGS: Patient presents for radioactive seed localization prior to lumpectomy. I met with the patient and we discussed the procedure of seed localization including benefits and alternatives. We discussed the high likelihood of a successful procedure. We discussed the risks of the procedure including infection, bleeding, tissue injury and further surgery. We discussed the low dose of radioactivity involved in the procedure. Informed, written consent was given. The usual time-out protocol was performed immediately prior to the procedure. Using ultrasound guidance, sterile technique, 1% lidocaine and an I-125 radioactive seed, an irregular mass with associated biopsy clip in the 2 o'clock position of the left breast, posterior was localized using a lateral to medial approach. The follow-up mammogram image in the MLO projection confirms the seed in the expected location and was marked for Dr. Georgette Dover. A CC projection was not performed, as the mass was not seen on post clip images performed recently in the CC projection. Ultrasound pictures were also taken, confirming the seed within the mass. Follow-up survey of the patient confirms presence of the radioactive seed. Order number of I-125 seed:  [id number]. Total activity: 1.027 millicuries Reference Date: 08 November 2015 The patient tolerated the procedure well and was released from the [facility]. She was given instructions regarding seed removal. IMPRESSION: Radioactive seed localization left breast. No apparent complications. Electronically Signed   By: Curlene Dolphin M.D.   On: 11/18/2015 16:33   [place] Lt Breast Bx W Loc Dev 1st Lesion Img Bx Spec US Guide  11/25/2015  ADDENDUM REPORT: 11/11/2015 10:55 ADDENDUM: Pathology revealed grade II invasive ductal carcinoma in the left breast and no evidence of carcinoma in the left axillary lymph  node. This was found to be concordant by Dr. Ammie Ferrier. Pathology results were discussed with the patient by telephone. The patient reported doing well after the biopsy. Post biopsy instructions and care were reviewed and questions were answered. The patient was encouraged to call [facility] for any additional concerns. Surgical consultation has been arranged with Dr. Verita Lamb at [facility] on November 08, 2015. Pathology results reported by Susa Raring RN, BSN on 11/06/2015. Electronically Signed   By: Ammie Ferrier M.D.   On: 11/11/2015 10:55  11/25/2015  CLINICAL DATA:  70 year old female with history of lung cancer and a mass in the left breast identified on a PET-CT presents for ultrasound-guided biopsy of this left breast mass and a left axillary lymph nodes. EXAM: ULTRASOUND GUIDED LEFT BREAST CORE NEEDLE BIOPSY COMPARISON:  Previous exam(s). FINDINGS: I met with the patient and we discussed the procedure of ultrasound-guided biopsy, including benefits and alternatives. We discussed the high likelihood of a successful procedure. We discussed the risks of the procedure, including infection, bleeding, tissue injury, clip migration, and inadequate sampling. Informed written consent was given. The usual time-out protocol was performed immediately prior to the procedure. Using sterile technique and 1% Lidocaine as local anesthetic, under direct ultrasound visualization, a 14 Gauge spring-loaded device was used to perform biopsy of a mass in the upper-outer quadrant of the left breast using a lateral approach. At the  conclusion of the procedure a ribbon shaped tissue marker clip was deployed into the biopsy cavity. Using sterile technique and 1% Lidocaine as local anesthetic, under direct ultrasound visualization, a 14 gauge spring-loaded device was used to perform biopsy of left axillary lymph node using a lateral approach. At the conclusion of the  procedure a HydroMARK tissue marker clip was deployed into the biopsy cavity. Follow up 2 view mammogram was performed and dictated separately. IMPRESSION: Ultrasound guided biopsy of a mass in the upper outer quadrant of the left breast and a left axillary lymph node. No apparent complications. Electronically Signed: By: Ammie Ferrier M.D. On: 11/04/2015 15:26   [place] Lt Breast Bx W Loc Dev Ea Add Lesion Img Bx Spec US Guide  11/19/2015  CLINICAL DATA:  70 year old female with history of lung cancer and a mass in the left breast identified on a PET-CT presents for ultrasound-guided biopsy of this left breast mass and a left axillary lymph nodes. EXAM: ULTRASOUND GUIDED LEFT BREAST CORE NEEDLE BIOPSY COMPARISON:  Previous exam(s). FINDINGS: I met with the patient and we discussed the procedure of ultrasound-guided biopsy, including benefits and alternatives. We discussed the high likelihood of a successful procedure. We discussed the risks of the procedure, including infection, bleeding, tissue injury, clip migration, and inadequate sampling. Informed written consent was given. The usual time-out protocol was performed immediately prior to the procedure. Using sterile technique and 1% Lidocaine as local anesthetic, under direct ultrasound visualization, a 14 Gauge spring-loaded device was used to perform biopsy of a mass in the upper-outer quadrant of the left breast using a lateral approach. At the conclusion of the procedure a ribbon shaped tissue marker clip was deployed into the biopsy cavity. Using sterile technique and 1% Lidocaine as local anesthetic, under direct ultrasound visualization, a 14 gauge spring-loaded device was used to perform biopsy of left axillary lymph node using a lateral approach. At the conclusion of the procedure a HydroMARK tissue marker clip was deployed into the biopsy cavity. Follow up 2 view mammogram was performed and dictated separately. IMPRESSION: Ultrasound guided biopsy of  a mass in the upper outer quadrant of the left breast and a left axillary lymph node. No apparent complications. Electronically Signed   By: Ammie Ferrier M.D.   On: 11/04/2015 15:26   Dg C-arm Bronchoscopy  11/13/2015  CLINICAL DATA:  C-ARM BRONCHOSCOPY Fluoroscopy was utilized by the requesting physician.  No radiographic interpretation.    ASSESSMENT: 70 y.o. [place] woman  (1) hypermetabolic right upper lobe lung mass noted on chest CT scan 09/11/2015, clinically T1a N0  (a) biopsy by video bronchoscopy 11/13/2015 nondiagnostic  (b) definitive stereotactic radiosurgery pending  (2) biopsy of a left upper outer quadrant breast mass 10/28/2015 showed a clinical T1c N0, stage IA invasive ductal carcinoma, grade 2, estrogen and progesterone strongly positive, HER-2 negative, with an Denise Mckenzie of 6%.  (3) status post left lumpectomy and sentinel lymph node sampling for 12/05/2015 for apT1c pN0, stage IA invasive ductal carcinoma, grade 2, repeat HER-2 again negative, with a focally positive inferior margin  (a) additional surgery for margin clearance is scheduled for 12/03/2015  (4) Adjuvant radiation to follow breast surgery  (5) to start anastrozole at the completion of local breast surgery   PLAN: We spent the better part of today's 50 minute appointment discussing the biology of breast cancer in general, and the specifics of the patient's tumor in particular. Emsley understands she has a very early stage breast cancer which appears slow-growing.  We discussed the difference between local and systemic therapy, and in terms of local treatment she has already had her lumpectomy and will have margin clearance surgery next week. Assuming that is successful she will proceed to adjuvant radiation.  She will then be ready for systemic treatment. We discussed the options which include chemotherapy, anti-HER-2 immunotherapy, and anti-estrogens. She is not a candidate for anti-HER-2 immunotherapy  since her breast cancer did not show HER-2 amplification. She could receive chemotherapy, but this generally is only marginally effective in slow-growing tumors like hers. Given the very early stage, her age and multiple other comorbidities, I am comfortable forgoing the minimal if any benefit she might receive from chemotherapy.  Accordingly her only systemic treatment will be anti-estrogens. I think she would be a particularly good candidate for anastrozole and we briefly discussed the possible toxicities, side effects and complications of this agent, which we are however not going to start until after she completes radiation.  She will return to see me in approximately 6 weeks. She should be done with her lung and breast irradiation by then. Since she is already on ibandronate I don't think we need to be terribly concerned about bone density issues. Nevertheless we will review her bone density from last year.  Kiaya  has a good understanding of the overall plan. She agrees with it. She knows the goal of treatment in her case is cure. She will call with any problems that may develop before her next visit here.  Denise Cruel, MD   11/27/2015 6:14 PM Medical Oncology and Hematology [facility] [address] [place], [place] [address] Tel. [phone]    Fax. [phone]

## 2015-11-28 ENCOUNTER — Telehealth: Payer: Self-pay | Admitting: *Deleted

## 2015-11-28 ENCOUNTER — Telehealth: Payer: Self-pay | Admitting: Oncology

## 2015-11-28 NOTE — Telephone Encounter (Signed)
SPOKE W/ PT CONFIRMED APPT FOR [place]

## 2015-11-28 NOTE — Telephone Encounter (Signed)
Left vm for pt to return call regarding navigation resources and to assess questions or needs. Contact information provided.

## 2015-12-02 ENCOUNTER — Encounter (HOSPITAL_COMMUNITY)
Admission: RE | Admit: 2015-12-02 | Discharge: 2015-12-02 | Disposition: A | Discharge status: Home / Self Care | Payer: Medicare Other | Source: Ambulatory Visit | Attending: Surgery | Admitting: Surgery

## 2015-12-02 ENCOUNTER — Encounter (HOSPITAL_COMMUNITY): Payer: Self-pay

## 2015-12-02 DIAGNOSIS — C50912 Malignant neoplasm of unspecified site of left female breast: Secondary | ICD-10-CM | POA: Diagnosis present

## 2015-12-02 DIAGNOSIS — Z803 Family history of malignant neoplasm of breast: Secondary | ICD-10-CM | POA: Diagnosis not present

## 2015-12-02 DIAGNOSIS — F329 Major depressive disorder, single episode, unspecified: Secondary | ICD-10-CM | POA: Diagnosis not present

## 2015-12-02 DIAGNOSIS — E119 Type 2 diabetes mellitus without complications: Secondary | ICD-10-CM | POA: Diagnosis not present

## 2015-12-02 DIAGNOSIS — I252 Old myocardial infarction: Secondary | ICD-10-CM | POA: Diagnosis not present

## 2015-12-02 DIAGNOSIS — Z7982 Long term (current) use of aspirin: Secondary | ICD-10-CM | POA: Diagnosis not present

## 2015-12-02 DIAGNOSIS — Z7984 Long term (current) use of oral hypoglycemic drugs: Secondary | ICD-10-CM | POA: Diagnosis not present

## 2015-12-02 DIAGNOSIS — Z170 Estrogen receptor positive status [ER+]: Secondary | ICD-10-CM | POA: Diagnosis not present

## 2015-12-02 DIAGNOSIS — E7800 Pure hypercholesterolemia, unspecified: Secondary | ICD-10-CM | POA: Diagnosis not present

## 2015-12-02 DIAGNOSIS — Z7983 Long term (current) use of bisphosphonates: Secondary | ICD-10-CM | POA: Diagnosis not present

## 2015-12-02 DIAGNOSIS — R911 Solitary pulmonary nodule: Secondary | ICD-10-CM | POA: Diagnosis not present

## 2015-12-02 DIAGNOSIS — E039 Hypothyroidism, unspecified: Secondary | ICD-10-CM | POA: Diagnosis not present

## 2015-12-02 DIAGNOSIS — Z79899 Other long term (current) drug therapy: Secondary | ICD-10-CM | POA: Diagnosis not present

## 2015-12-02 DIAGNOSIS — J439 Emphysema, unspecified: Secondary | ICD-10-CM | POA: Diagnosis not present

## 2015-12-02 DIAGNOSIS — Z951 Presence of aortocoronary bypass graft: Secondary | ICD-10-CM | POA: Diagnosis not present

## 2015-12-02 DIAGNOSIS — C50412 Malignant neoplasm of upper-outer quadrant of left female breast: Secondary | ICD-10-CM | POA: Diagnosis not present

## 2015-12-02 DIAGNOSIS — Z87891 Personal history of nicotine dependence: Secondary | ICD-10-CM | POA: Diagnosis not present

## 2015-12-02 DIAGNOSIS — I2510 Atherosclerotic heart disease of native coronary artery without angina pectoris: Secondary | ICD-10-CM | POA: Diagnosis not present

## 2015-12-02 HISTORY — DX: Personal history of pneumonia (recurrent): Z87.01

## 2015-12-02 HISTORY — DX: Stress incontinence (female) (male): N39.3

## 2015-12-02 HISTORY — DX: Personal history of other diseases of the respiratory system: Z87.09

## 2015-12-02 LAB — GLUCOSE, CAPILLARY: Glucose-Capillary: 97 mg/dL (ref 65–99)

## 2015-12-02 MED ORDER — CEFAZOLIN SODIUM-DEXTROSE 2-4 GM/100ML-% IV SOLN
2.0000 g | INTRAVENOUS | Status: AC
  Administered 2015-12-03: 2 g via INTRAVENOUS
  Filled 2015-12-02: qty 100

## 2015-12-02 MED ORDER — CHLORHEXIDINE GLUCONATE 4 % EX LIQD: 1.0000 "application " | Freq: Once | CUTANEOUS | Status: DC

## 2015-12-02 NOTE — Progress Notes (Addendum)
PCP - Dr. Lorene Dy Cardiologist - Dr. Cathie Olden  EKG - 10/24/15 CXR - 11/13/15 Stress tset - 2005  Patient denies chest pain and shortness of breath at PAT appointment.    Patient reports that A1C was recently drawn with Dr. Mancel Bale and was 6.2.  Patient states that she does not check her blood sugars at home.

## 2015-12-02 NOTE — Pre-Procedure Instructions (Signed)
Denise Mckenzie  12/02/2015      [id number] [place], [place] - [address] [place] [id number]: [phone]: [phone]   Your procedure is scheduled on Tuesday, April 18th, 2017.  [facility]  Call this number if you have problems the morning of surgery:  [phone]   Remember:  Do not eat food or drink liquids after midnight.   Take these medicines the morning of surgery with A SIP OF WATER: Acetaminophen (Tylenol) if needed, Hydrocodone-acetaminophen (Norco/Vicodin) if needed, Levothyroxine (Synthroid), Metoprolol (Lopressor)   WHAT DO I DO ABOUT MY DIABETES MEDICATION?   .Marland KitchenDo not take oral diabetes medicines (pills) the morning of surgery.  Do NOT take Metformin the morning of surgery.     Stop taking: Aspirin, NSAIDS, Aleve, Naproxen, Ibuprofen, Advil, Motrin, BC's, Goody's, Fish oil, all herbal medications, and all vitamins.     Do not wear jewelry, make-up or nail polish.  Do not wear lotions, powders, or perfumes.  You may NOT wear deodorant.  Do not shave 48 hours prior to surgery.    Do not bring valuables to the hospital.   [facility] or [facility].  Contacts, dentures or bridgework may not be worn into surgery.  Leave your suitcase in the car.  After surgery it may be brought to your room.  For patients admitted to the hospital, discharge time will be determined by your treatment team.  Patients discharged the day of surgery will not be allowed to drive home.   Special instructions:  See attached.   Please read over the following fact sheets that you were given. Pain Booklet, Coughing and Deep Breathing and Surgical Site Infection Prevention     How to Manage Your Diabetes Before and After Surgery  Why is it important to control my blood sugar before and after surgery? . Improving blood sugar levels  before and after surgery helps healing and can limit problems. . A way of improving blood sugar control is eating a healthy diet by: o  Eating less sugar and carbohydrates o  Increasing activity/exercise o  Talking with your doctor about reaching your blood sugar goals . High blood sugars (greater than 180 mg/dL) can raise your risk of infections and slow your recovery, so you will need to focus on controlling your diabetes during the weeks before surgery. . Make sure that the doctor who takes care of your diabetes knows about your planned surgery including the date and location.  How do I manage my blood sugar before surgery? . Check your blood sugar at least 4 times a day, starting 2 days before surgery, to make sure that the level is not too high or low. o Check your blood sugar the morning of your surgery when you wake up and every 2 hours until you get to the Short Stay unit. . If your blood sugar is less than 70 mg/dL, you will need to treat for low blood sugar: o Do not take insulin. o Treat a low blood sugar (less than 70 mg/dL) with  cup of clear juice (cranberry or apple), 4 glucose tablets, OR glucose gel. o Recheck blood sugar in 15 minutes after treatment (to make sure it is greater than 70 mg/dL). If your blood sugar is not greater than 70 mg/dL on recheck, call [phone] further instructions. .Marland Kitchen  Report your blood sugar to the short stay nurse when you get to Short Stay.  . If you are admitted to the hospital after surgery: o Your blood sugar will be checked by the staff and you will probably be given insulin after surgery (instead of oral diabetes medicines) to make sure you have good blood sugar levels. o The goal for blood sugar control after surgery is 80-180 mg/dL.     Reviewed and Endorsed by [facility] Patient Education Committee, August 2015

## 2015-12-02 NOTE — Progress Notes (Signed)
Anesthesia Chart Review: Patient is a 70 year old female scheduled for re-excision inferior margin of left breast lumpectomy on 12/03/15 by Dr. Georgette Dover. She is s/p radioactive seed guided left partial mastectomy with axillary sentinel LN biopsy on 11/19/15. Pathology showed Invasive ductal carcinoma grade II/III and ductal carcinoma in situ with evidence of invasive disease at the inferior margin. 4 of 4 lymph nodes were negative.   She is also s/p video bronchoscopy with endobronchial navigation, RUL lung biopsy, placement of fiducial markers on 11/13/15 by Dr. Servando Snare. (I reviewed her chart prior to that procedure.) Pathology showed blood clot and scant mucosal tissue. On 11/15/15, Dr. Servando Snare wrote, "Patient called to review the findings of needle biopsy of the right lung lesion, no conclusive diagnosis of lung cancer is made by pathology. Cytology was noted to have 'atypical cells'. I discussed the findings with Dr. Pablo Ledger, radiation oncology. With a highly suspicious nature of the PET scan and the finding of atypical cells we are in agreement with proceeding with stereotactic radiotherapy to the right upper lobe lung lesion, and not with surgical resection both because of the the significant limitation of her pulmonary function studies especially diffusion capacity. Stereotactic radiotherapy to the right lung lesion will speed the treatment of both the lung mass and the rest [sic] cancer. I've left message and Dr Gershon Crane office to proceed with his planned surgery next week. Dr. Pablo Ledger will see the patient for radiation both to the right lung and the left breast postoperatively."  Other history includes left breast cancer s/p lumpectomy 11/19/15, right lung lesion (high suspicious for cancer, biopsy non-diagnostic; stereotactic radiotherapy anticipated following breast surgery), former smoker, MI '95, CAD s/p CABG '96, HLD, HTN, hypothyroidism, DM2, depression, osteoporosis, TMJ surgery, tonsillectomy,  appendectomy, hysterectomy. Last BMI recorded was 30.98 consistent with mild obesity.   PCP is listed as Dr. Lorene Dy. She has seen oncologists Dr. Julien Nordmann and Dr. Jana Hakim. She will meet with Dr. Pablo Ledger to discuss radiation oncology options. Cardiologist is Dr. Harrington Challenger (previously Dr. Acie Fredrickson in 2012). She was seen by Cecilie Kicks, NP on 10/24/15 for a pre-operative evaluation for lung biopsy. Her note indicated that case was discussed with Dr. Harrington Challenger and no pre-operative testing was recommended because EKG was stable, she was asymptomatic and able to perform activities such as vacuuming.  Meds include Elavil, ASA, Lipitor, Calcium + D, Boniva, Krill oil, levothyroxine, losartan-HCTZ, metformin, metoprolol, Nitro.  10/24/15 EKG: NSR, low voltage QRS, septal infarct (age undetermined), T wave inversion in V1-2 and high lateral leads which were present on 06/16/11 tracing as well.  Remote history of a normal stress test, EF 79% on 11/06/03.   10/23/15 PFTs: FVC 1.60 59%, FEV1 0.96 47%, DLCO 6.19 28% Pulmonary Function Diagnosis: Severe Obstructive Airways Disease Severe Diffusion Defect c/w emphysema  11/13/15 1V CXR: FINDINGS: The patient is status post median sternotomy and CABG procedure. The heart size is normal. No airspace consolidation. Right upper lobe pulmonary nodule is again identified. There is no pneumothorax following bronchoscopy. IMPRESSION: No pneumothorax identified status post bronchoscopy and biopsy of right upper lobe lung nodule.  10/11/15 PET scan: IMPRESSION: 1. Intensely hypermetabolic 1.4 cm right upper lobe pulmonary nodule, in keeping with a primary bronchogenic carcinoma. 2. No hypermetabolic thoracic nodal metastases. No hypermetabolic distant metastatic disease. PET-CT staging is stage IA (T1a N0 M0). 3. Hypermetabolic 1.0 cm soft tissue nodule in the far posterior left breast, which is nonspecific, cannot exclude a primary left breast malignancy. Recommend further  evaluation with diagnostic  mammography. 4. Additional findings include extensive coronary atherosclerosis status post CABG, mild emphysema with diffuse bronchial wall thickening suggesting COPD, right adrenal adenoma and ectatic atherosclerotic infrarenal abdominal aorta.  Labs from 11/27/15 noted. Cr 0.9. WBC 11.0. H/H 14.8/44.2, PLT 220. Glcuose 88.  Patient had recent cardiology evaluation prior to lung biopsy. She underwent left breast earlier this month and needs inferior margin re-excised. If no acute changes then I would anticipate that she could proceed as planned.    [facility] Short Stay Center/Anesthesiology Phone [phone] 12/02/2015 2:04 PM

## 2015-12-02 NOTE — Anesthesia Preprocedure Evaluation (Addendum)
Anesthesia Evaluation  Patient identified by MRN, date of birth, ID band Patient awake    Reviewed: Allergy & Precautions, H&P , NPO status , Patient's Chart, lab work & pertinent test results  Airway Mallampati: II  TM Distance: >3 FB Neck ROM: Full    Dental  (+) Edentulous Upper, Edentulous Lower, Upper Dentures, Lower Dentures, Dental Advidsory Given   Pulmonary COPD, former smoker,    breath sounds clear to auscultation + decreased breath sounds      Cardiovascular hypertension, Pt. on home beta blockers and Pt. on medications + CAD, + Past MI and + CABG   Rhythm:Regular Rate:Normal     Neuro/Psych Depression    GI/Hepatic   Endo/Other  diabetes, Well Controlled, Type 2, Oral Hypoglycemic AgentsHypothyroidism   Renal/GU      Musculoskeletal   Abdominal   Peds  Hematology   Anesthesia Other Findings   Reproductive/Obstetrics                            Anesthesia Physical  Anesthesia Plan  ASA: III  Anesthesia Plan: General   Post-op Pain Management:    Induction: Intravenous  Airway Management Planned: LMA  Additional Equipment:   Intra-op Plan:   Post-operative Plan: Extubation in OR  Informed Consent: I have reviewed the patients History and Physical, chart, labs and discussed the procedure including the risks, benefits and alternatives for the proposed anesthesia with the patient or authorized representative who has indicated his/her understanding and acceptance.   Dental advisory given and Dental Advisory Given  Plan Discussed with: Anesthesiologist and Surgeon  Anesthesia Plan Comments:        Anesthesia Quick Evaluation                                  Anesthesia Evaluation  Patient identified by MRN, date of birth, ID band Patient awake    Airway Mallampati: II  TM Distance: >3 FB Neck ROM: Full    Dental  (+) Edentulous Upper, Edentulous Lower,  Upper Dentures, Lower Dentures   Pulmonary COPD, former smoker,    breath sounds clear to auscultation + decreased breath sounds      Cardiovascular hypertension, Pt. on home beta blockers and Pt. on medications + CAD, + Past MI and + CABG   Rhythm:Regular Rate:Normal     Neuro/Psych Depression    GI/Hepatic   Endo/Other  diabetes, Type 2, Oral Hypoglycemic AgentsHypothyroidism   Renal/GU      Musculoskeletal   Abdominal   Peds  Hematology   Anesthesia Other Findings   Reproductive/Obstetrics                             Anesthesia Physical  Anesthesia Plan  ASA: III  Anesthesia Plan: General   Post-op Pain Management:    Induction: Intravenous  Airway Management Planned:   Additional Equipment:   Intra-op Plan:   Post-operative Plan: Extubation in OR  Informed Consent: I have reviewed the patients History and Physical, chart, labs and discussed the procedure including the risks, benefits and alternatives for the proposed anesthesia with the patient or authorized representative who has indicated his/her understanding and acceptance.   Dental advisory given  Plan Discussed with: Anesthesiologist and Surgeon  Anesthesia Plan Comments:         Anesthesia Quick Evaluation

## 2015-12-03 ENCOUNTER — Encounter (HOSPITAL_COMMUNITY)
Admission: RE | Disposition: A | Discharge status: Home / Self Care | Payer: Self-pay | Source: Ambulatory Visit | Attending: Surgery

## 2015-12-03 ENCOUNTER — Ambulatory Visit (HOSPITAL_COMMUNITY): Payer: Medicare Other | Admitting: Certified Registered Nurse Anesthetist

## 2015-12-03 ENCOUNTER — Encounter (HOSPITAL_COMMUNITY): Payer: Self-pay | Admitting: *Deleted

## 2015-12-03 ENCOUNTER — Ambulatory Visit (HOSPITAL_COMMUNITY)
Admission: RE | Admit: 2015-12-03 | Discharge: 2015-12-03 | Disposition: A | Discharge status: Home / Self Care | Payer: Medicare Other | Source: Ambulatory Visit | Attending: Surgery | Admitting: Surgery

## 2015-12-03 ENCOUNTER — Ambulatory Visit (HOSPITAL_COMMUNITY): Payer: Medicare Other | Admitting: Vascular Surgery

## 2015-12-03 DIAGNOSIS — Z170 Estrogen receptor positive status [ER+]: Secondary | ICD-10-CM | POA: Insufficient documentation

## 2015-12-03 DIAGNOSIS — I2510 Atherosclerotic heart disease of native coronary artery without angina pectoris: Secondary | ICD-10-CM | POA: Diagnosis not present

## 2015-12-03 DIAGNOSIS — Z951 Presence of aortocoronary bypass graft: Secondary | ICD-10-CM | POA: Insufficient documentation

## 2015-12-03 DIAGNOSIS — Z7984 Long term (current) use of oral hypoglycemic drugs: Secondary | ICD-10-CM | POA: Insufficient documentation

## 2015-12-03 DIAGNOSIS — F329 Major depressive disorder, single episode, unspecified: Secondary | ICD-10-CM | POA: Insufficient documentation

## 2015-12-03 DIAGNOSIS — Z7982 Long term (current) use of aspirin: Secondary | ICD-10-CM | POA: Insufficient documentation

## 2015-12-03 DIAGNOSIS — Z79899 Other long term (current) drug therapy: Secondary | ICD-10-CM | POA: Insufficient documentation

## 2015-12-03 DIAGNOSIS — Z803 Family history of malignant neoplasm of breast: Secondary | ICD-10-CM | POA: Insufficient documentation

## 2015-12-03 DIAGNOSIS — Z7983 Long term (current) use of bisphosphonates: Secondary | ICD-10-CM | POA: Insufficient documentation

## 2015-12-03 DIAGNOSIS — E7800 Pure hypercholesterolemia, unspecified: Secondary | ICD-10-CM | POA: Insufficient documentation

## 2015-12-03 DIAGNOSIS — J439 Emphysema, unspecified: Secondary | ICD-10-CM | POA: Diagnosis not present

## 2015-12-03 DIAGNOSIS — C50412 Malignant neoplasm of upper-outer quadrant of left female breast: Secondary | ICD-10-CM | POA: Diagnosis not present

## 2015-12-03 DIAGNOSIS — I252 Old myocardial infarction: Secondary | ICD-10-CM | POA: Insufficient documentation

## 2015-12-03 DIAGNOSIS — E039 Hypothyroidism, unspecified: Secondary | ICD-10-CM | POA: Insufficient documentation

## 2015-12-03 DIAGNOSIS — R911 Solitary pulmonary nodule: Secondary | ICD-10-CM | POA: Insufficient documentation

## 2015-12-03 DIAGNOSIS — E119 Type 2 diabetes mellitus without complications: Secondary | ICD-10-CM | POA: Insufficient documentation

## 2015-12-03 DIAGNOSIS — Z87891 Personal history of nicotine dependence: Secondary | ICD-10-CM | POA: Insufficient documentation

## 2015-12-03 HISTORY — PX: RE-EXCISION OF BREAST LUMPECTOMY: SHX6048

## 2015-12-03 LAB — GLUCOSE, CAPILLARY
Glucose-Capillary: 126 mg/dL — ABNORMAL HIGH (ref 65–99)
Glucose-Capillary: 141 mg/dL — ABNORMAL HIGH (ref 65–99)

## 2015-12-03 SURGERY — EXCISION, LESION, BREAST
Anesthesia: General | Site: Breast | Laterality: Left

## 2015-12-03 MED ORDER — LIDOCAINE HCL (CARDIAC) 20 MG/ML IV SOLN
INTRAVENOUS | Status: DC | PRN
  Administered 2015-12-03: 100 mg via INTRAVENOUS

## 2015-12-03 MED ORDER — LIDOCAINE HCL (CARDIAC) 20 MG/ML IV SOLN
INTRAVENOUS | Status: AC
  Filled 2015-12-03: qty 10

## 2015-12-03 MED ORDER — HYDROCODONE-ACETAMINOPHEN 5-325 MG PO TABS
1.0000 | ORAL_TABLET | Freq: Once | ORAL | Status: AC
  Administered 2015-12-03: 1 via ORAL

## 2015-12-03 MED ORDER — FENTANYL CITRATE (PF) 100 MCG/2ML IJ SOLN
INTRAMUSCULAR | Status: DC | PRN
  Administered 2015-12-03: 50 ug via INTRAVENOUS

## 2015-12-03 MED ORDER — FENTANYL CITRATE (PF) 250 MCG/5ML IJ SOLN
INTRAMUSCULAR | Status: AC
  Filled 2015-12-03: qty 5

## 2015-12-03 MED ORDER — PROPOFOL 10 MG/ML IV BOLUS
INTRAVENOUS | Status: DC | PRN
  Administered 2015-12-03: 125 mg via INTRAVENOUS

## 2015-12-03 MED ORDER — 0.9 % SODIUM CHLORIDE (POUR BTL) OPTIME
TOPICAL | Status: DC | PRN
  Administered 2015-12-03: 1000 mL

## 2015-12-03 MED ORDER — ONDANSETRON HCL 4 MG/2ML IJ SOLN
INTRAMUSCULAR | Status: AC
  Filled 2015-12-03: qty 4

## 2015-12-03 MED ORDER — EPHEDRINE SULFATE 50 MG/ML IJ SOLN
INTRAMUSCULAR | Status: AC
  Filled 2015-12-03: qty 1

## 2015-12-03 MED ORDER — HYDROCODONE-ACETAMINOPHEN 5-325 MG PO TABS: 1.0000 | ORAL_TABLET | Freq: Once | ORAL | Status: DC

## 2015-12-03 MED ORDER — MIDAZOLAM HCL 5 MG/5ML IJ SOLN
INTRAMUSCULAR | Status: DC | PRN
  Administered 2015-12-03: 2 mg via INTRAVENOUS

## 2015-12-03 MED ORDER — BUPIVACAINE-EPINEPHRINE (PF) 0.25% -1:200000 IJ SOLN
INTRAMUSCULAR | Status: AC
  Filled 2015-12-03: qty 30

## 2015-12-03 MED ORDER — MIDAZOLAM HCL 2 MG/2ML IJ SOLN
INTRAMUSCULAR | Status: AC
  Filled 2015-12-03: qty 2

## 2015-12-03 MED ORDER — PHENYLEPHRINE 40 MCG/ML (10ML) SYRINGE FOR IV PUSH (FOR BLOOD PRESSURE SUPPORT)
PREFILLED_SYRINGE | INTRAVENOUS | Status: AC
  Filled 2015-12-03: qty 10

## 2015-12-03 MED ORDER — HYDROCODONE-ACETAMINOPHEN 5-325 MG PO TABS: 1.0000 | ORAL_TABLET | ORAL | Status: DC | PRN

## 2015-12-03 MED ORDER — ONDANSETRON HCL 4 MG/2ML IJ SOLN: 4.0000 mg | INTRAMUSCULAR | Status: DC | PRN

## 2015-12-03 MED ORDER — PROPOFOL 10 MG/ML IV BOLUS
INTRAVENOUS | Status: AC
  Filled 2015-12-03: qty 20

## 2015-12-03 MED ORDER — LACTATED RINGERS IV SOLN
INTRAVENOUS | Status: DC | PRN
  Administered 2015-12-03: 07:00:00 via INTRAVENOUS

## 2015-12-03 MED ORDER — BUPIVACAINE-EPINEPHRINE 0.25% -1:200000 IJ SOLN
INTRAMUSCULAR | Status: DC | PRN
  Administered 2015-12-03: 10 mL

## 2015-12-03 MED ORDER — EPHEDRINE SULFATE 50 MG/ML IJ SOLN
INTRAMUSCULAR | Status: DC | PRN
  Administered 2015-12-03: 10 mg via INTRAVENOUS
  Administered 2015-12-03 (×2): 5 mg via INTRAVENOUS

## 2015-12-03 MED ORDER — ONDANSETRON HCL 4 MG/2ML IJ SOLN
INTRAMUSCULAR | Status: DC | PRN
  Administered 2015-12-03: 4 mg via INTRAVENOUS

## 2015-12-03 MED ORDER — PHENYLEPHRINE HCL 10 MG/ML IJ SOLN
INTRAMUSCULAR | Status: DC | PRN
  Administered 2015-12-03 (×2): 40 ug via INTRAVENOUS
  Administered 2015-12-03: 80 ug via INTRAVENOUS
  Administered 2015-12-03: 40 ug via INTRAVENOUS

## 2015-12-03 MED ORDER — HYDROCODONE-ACETAMINOPHEN 5-325 MG PO TABS
ORAL_TABLET | ORAL | Status: AC
  Filled 2015-12-03: qty 1

## 2015-12-03 MED ORDER — MORPHINE SULFATE (PF) 2 MG/ML IV SOLN: 2.0000 mg | INTRAVENOUS | Status: DC | PRN

## 2015-12-03 SURGICAL SUPPLY — 56 items
APL SKNCLS STERI-STRIP NONHPOA (GAUZE/BANDAGES/DRESSINGS) ×1
APPLIER CLIP 11 MED OPEN (CLIP) ×3
APR CLP MED 11 20 MLT OPN (CLIP) ×1
BENZOIN TINCTURE PRP APPL 2/3 (GAUZE/BANDAGES/DRESSINGS) ×3 IMPLANT
BINDER BREAST LRG (GAUZE/BANDAGES/DRESSINGS) IMPLANT
BINDER BREAST XLRG (GAUZE/BANDAGES/DRESSINGS) ×2 IMPLANT
BLADE SURG 10 STRL SS (BLADE) ×2 IMPLANT
BLADE SURG 15 STRL LF DISP TIS (BLADE) IMPLANT
BLADE SURG 15 STRL SS (BLADE) ×3
CHLORAPREP W/TINT 26ML (MISCELLANEOUS) ×3 IMPLANT
CLIP APPLIE 11 MED OPEN (CLIP) IMPLANT
CLOSURE STERI-STRIP 1/2X4 (GAUZE/BANDAGES/DRESSINGS) ×1
CLOSURE WOUND 1/2 X4 (GAUZE/BANDAGES/DRESSINGS) ×1
CLSR STERI-STRIP ANTIMIC 1/2X4 (GAUZE/BANDAGES/DRESSINGS) ×1 IMPLANT
CONT SPEC 4OZ CLIKSEAL STRL BL (MISCELLANEOUS) ×3 IMPLANT
COVER SURGICAL LIGHT HANDLE (MISCELLANEOUS) ×3 IMPLANT
DECANTER SPIKE VIAL GLASS SM (MISCELLANEOUS) IMPLANT
DRAPE CHEST BREAST 15X10 FENES (DRAPES) ×3 IMPLANT
DRAPE UTILITY XL STRL (DRAPES) ×6 IMPLANT
DRSG TEGADERM 4X4.75 (GAUZE/BANDAGES/DRESSINGS) ×3 IMPLANT
ELECT CAUTERY BLADE 6.4 (BLADE) ×3 IMPLANT
ELECT REM PT RETURN 9FT ADLT (ELECTROSURGICAL) ×3
ELECTRODE REM PT RTRN 9FT ADLT (ELECTROSURGICAL) ×1 IMPLANT
GAUZE SPONGE 4X4 12PLY STRL (GAUZE/BANDAGES/DRESSINGS) ×3 IMPLANT
GAUZE SPONGE 4X4 16PLY XRAY LF (GAUZE/BANDAGES/DRESSINGS) ×3 IMPLANT
GLOVE BIO SURGEON STRL SZ7 (GLOVE) ×3 IMPLANT
GLOVE BIOGEL PI IND STRL 6.5 (GLOVE) IMPLANT
GLOVE BIOGEL PI IND STRL 7.5 (GLOVE) ×1 IMPLANT
GLOVE BIOGEL PI INDICATOR 6.5 (GLOVE) ×2
GLOVE BIOGEL PI INDICATOR 7.5 (GLOVE) ×2
GLOVE SURG SS PI 7.0 STRL IVOR (GLOVE) ×2 IMPLANT
GOWN STRL REUS W/ TWL LRG LVL3 (GOWN DISPOSABLE) ×2 IMPLANT
GOWN STRL REUS W/TWL LRG LVL3 (GOWN DISPOSABLE) ×15
KIT BASIN OR (CUSTOM PROCEDURE TRAY) ×3 IMPLANT
KIT MARKER MARGIN INK (KITS) ×4 IMPLANT
KIT ROOM TURNOVER OR (KITS) ×3 IMPLANT
NDL HYPO 25GX1X1/2 BEV (NEEDLE) ×1 IMPLANT
NEEDLE HYPO 25GX1X1/2 BEV (NEEDLE) ×3 IMPLANT
NS IRRIG 1000ML POUR BTL (IV SOLUTION) ×3 IMPLANT
PACK SURGICAL SETUP 50X90 (CUSTOM PROCEDURE TRAY) ×3 IMPLANT
PAD ARMBOARD 7.5X6 YLW CONV (MISCELLANEOUS) ×3 IMPLANT
PENCIL BUTTON HOLSTER BLD 10FT (ELECTRODE) ×3 IMPLANT
SPONGE GAUZE 4X4 12PLY STER LF (GAUZE/BANDAGES/DRESSINGS) ×2 IMPLANT
STRIP CLOSURE SKIN 1/2X4 (GAUZE/BANDAGES/DRESSINGS) ×2 IMPLANT
SUT MNCRL AB 4-0 PS2 18 (SUTURE) ×3 IMPLANT
SUT SILK 2 0 SH (SUTURE) IMPLANT
SUT VIC AB 3-0 SH 27 (SUTURE) ×3
SUT VIC AB 3-0 SH 27XBRD (SUTURE) ×1 IMPLANT
SYR BULB 3OZ (MISCELLANEOUS) ×3 IMPLANT
SYR CONTROL 10ML LL (SYRINGE) ×3 IMPLANT
TOWEL OR 17X24 6PK STRL BLUE (TOWEL DISPOSABLE) ×3 IMPLANT
TOWEL OR 17X26 10 PK STRL BLUE (TOWEL DISPOSABLE) ×3 IMPLANT
TUBE CONNECTING 12'X1/4 (SUCTIONS) ×1
TUBE CONNECTING 12X1/4 (SUCTIONS) ×2 IMPLANT
WATER STERILE IRR 1000ML POUR (IV SOLUTION) IMPLANT
YANKAUER SUCT BULB TIP NO VENT (SUCTIONS) ×3 IMPLANT

## 2015-12-03 NOTE — Transfer of Care (Signed)
Immediate Anesthesia Transfer of Care Note  Patient: Denise Mckenzie  Procedure(s) Performed: Procedure(s): RE-EXCISION INFERIOR MARGIN OF LEFT BREAST LUMPECTOMY (Left)  Patient Location: PACU  Anesthesia Type:General  Level of Consciousness: awake, alert  and oriented  Airway & Oxygen Therapy: Patient Spontanous Breathing and Patient connected to nasal cannula oxygen  Post-op Assessment: Report given to RN, Post -op Vital signs reviewed and stable and Patient moving all extremities X 4  Post vital signs: Reviewed and stable  Last Vitals:  Filed Vitals:   12/03/15 0641  BP: 112/70  Pulse: 66  Temp: 36.9 C  Resp: 18    Complications: No apparent anesthesia complications

## 2015-12-03 NOTE — Anesthesia Postprocedure Evaluation (Signed)
Anesthesia Post Note  Patient: Denise Mckenzie  Procedure(s) Performed: Procedure(s) (LRB): RE-EXCISION INFERIOR MARGIN OF LEFT BREAST LUMPECTOMY (Left)  Patient location during evaluation: PACU Anesthesia Type: General Level of consciousness: sedated Pain management: satisfactory to patient Vital Signs Assessment: post-procedure vital signs reviewed and stable Respiratory status: spontaneous breathing Cardiovascular status: stable Anesthetic complications: no    Last Vitals:  Filed Vitals:   12/03/15 0845 12/03/15 0902  BP: 140/77 108/54  Pulse: 74 73  Temp: 36.8 C   Resp: 16     Last Pain:  Filed Vitals:   12/03/15 0953  PainSc: 4                  Herta Hink EDWARD

## 2015-12-03 NOTE — Interval H&P Note (Signed)
History and Physical Interval Note:  12/03/2015 7:11 AM  Denise Mckenzie  has presented today for surgery, with the diagnosis of INVASIVE DUCTAL CARINOMA LEFT BREAST  The various methods of treatment have been discussed with the patient and family. After consideration of risks, benefits and other options for treatment, the patient has consented to  Procedure(s): RE-EXCISION INFERIOR MARGIN OF LEFT BREAST LUMPECTOMY (Left) as a surgical intervention .  The patient's history has been reviewed, patient examined, no change in status, stable for surgery.  I have reviewed the patient's chart and labs.  Questions were answered to the patient's satisfaction.     Anelle Parlow K.

## 2015-12-03 NOTE — Discharge Instructions (Signed)
[facility] °Office Phone Number [phone] ° °BREAST BIOPSY/ PARTIAL MASTECTOMY: POST OP INSTRUCTIONS ° °Always review your discharge instruction sheet given to you by the facility where your surgery was performed. ° °IF YOU HAVE DISABILITY OR FAMILY LEAVE FORMS, YOU MUST BRING THEM TO THE OFFICE FOR PROCESSING.  DO NOT GIVE THEM TO YOUR DOCTOR. ° °1. A prescription for pain medication may be given to you upon discharge.  Take your pain medication as prescribed, if needed.  If narcotic pain medicine is not needed, then you may take acetaminophen (Tylenol) or ibuprofen (Advil) as needed. °2. Take your usually prescribed medications unless otherwise directed °3. If you need a refill on your pain medication, please contact your pharmacy.  They will contact our office to request authorization.  Prescriptions will not be filled after 5pm or on week-ends. °4. You should eat very light the first 24 hours after surgery, such as soup, crackers, pudding, etc.  Resume your normal diet the day after surgery. °5. Most patients will experience some swelling and bruising in the breast.  Ice packs and a good support bra will help.  Swelling and bruising can take several days to resolve.  °6. It is common to experience some constipation if taking pain medication after surgery.  Increasing fluid intake and taking a stool softener will usually help or prevent this problem from occurring.  A mild laxative (Milk of Magnesia or Miralax) should be taken according to package directions if there are no bowel movements after 48 hours. °7. Unless discharge instructions indicate otherwise, you may remove your bandages 24-48 hours after surgery, and you may shower at that time.  You may have steri-strips (small skin tapes) in place directly over the incision.  These strips should be left on the skin for 7-10 days.  If your surgeon used skin glue on the incision, you may shower in 24 hours.  The glue will flake off over the  next 2-3 weeks.  Any sutures or staples will be removed at the office during your follow-up visit. °8. ACTIVITIES:  You may resume regular daily activities (gradually increasing) beginning the next day.  Wearing a good support bra or sports bra minimizes pain and swelling.  You may have sexual intercourse when it is comfortable. °a. You may drive when you no longer are taking prescription pain medication, you can comfortably wear a seatbelt, and you can safely maneuver your car and apply brakes. °b. RETURN TO WORK:  ______________________________________________________________________________________ °9. You should see your doctor in the office for a follow-up appointment approximately two weeks after your surgery.  Your doctor’s nurse will typically make your follow-up appointment when she calls you with your pathology report.  Expect your pathology report 2-3 business days after your surgery.  You may call to check if you do not hear from us after three days. °10. OTHER INSTRUCTIONS: _______________________________________________________________________________________________ _____________________________________________________________________________________________________________________________________ °_____________________________________________________________________________________________________________________________________ °_____________________________________________________________________________________________________________________________________ ° °WHEN TO CALL YOUR DOCTOR: °1. Fever over 101.0 °2. Nausea and/or vomiting. °3. Extreme swelling or bruising. °4. Continued bleeding from incision. °5. Increased pain, redness, or drainage from the incision. ° °The clinic staff is available to answer your questions during regular business hours.  Please don’t hesitate to call and ask to speak to one of the nurses for clinical concerns.  If you have a medical emergency, go to the nearest  emergency room or call 911.  A surgeon from [facility] is always on call at the hospital. ° °For further questions, please visit [email or web address]  °

## 2015-12-03 NOTE — Anesthesia Procedure Notes (Addendum)
Procedure Name: LMA Insertion Date/Time: 12/03/2015 7:34 AM Performed by: Neldon Newport Pre-anesthesia Checklist: Timeout performed, Patient being monitored, Suction available, Emergency Drugs available and Patient identified Patient Re-evaluated:Patient Re-evaluated prior to inductionOxygen Delivery Method: Circle system utilized Preoxygenation: Pre-oxygenation with 100% oxygen Intubation Type: IV induction Ventilation: Mask ventilation without difficulty LMA: LMA inserted LMA Size: 4.0 Number of attempts: 1 Placement Confirmation: positive ETCO2 and breath sounds checked- equal and bilateral Tube secured with: Tape Dental Injury: Teeth and Oropharynx as per pre-operative assessment    Anesthesia Regional Block:  Pectoralis block  Pre-Anesthetic Checklist: ,, timeout performed, Correct Patient, Correct Site, Correct Laterality, Correct Procedure, Correct Position, site marked, Risks and benefits discussed, at surgeon's request and post-op pain management  Laterality: Left  Prep: chloraprep       Needles:   Needle Type: Echogenic Needle     Needle Length: 9cm 9 cm Needle Gauge: 24 and 24 G    Additional Needles:  Procedures: ultrasound guided (picture in chart) Pectoralis block Narrative:  End time: 12/03/2015 7:19 AM Injection made incrementally with aspirations every 5 mL.  Performed by: Personally  Anesthesiologist: Lyndle Herrlich  Additional Notes: 15cc Bupivicaine .5% deep and 15cc between pec major and minor

## 2015-12-03 NOTE — H&P (View-Only) (Signed)
Expand All Collapse All    History of Present Illness Denise Mckenzie. Denise Weider MD;  Patient words: breast eval.    PCP - Dr. Sharyn Lull Collins/ Dr. Lorene Dy Cardiothoracic - Dr. Ceasar Mons Oncology - Dr. Julien Nordmann  Reason for consultation - new left breast cancer  This is a 70 yo female who is being worked up for a right upper lung mass noted on chest x-ray. CT/PET showed an intensely hypermetabolic 1.4 cm right upper lobe pulmonary nodule with no sign of thoracic nodal disease. These findings are consistent with stage 1A primary bronchogenic carcinoma. She is scheduled to under go biopsy of this lesion on 11/13/15. The PET/CT also showed an incidental finding of a hypermetabolic 1.0 cm mass in her posterior left breast. There have been some questionable findings in this area on recent mammograms and she has been followed closely with q6 month studies. She underwent mammogram and ultrasound, as well as core biopsy, which revealed an invasive ductal carcinoma. The lymph node core biopsy was negative.  On 11/19/15, the patient underwent left radioactive seed localized lumpectomy and sentinel lymph node biopsy.  The SLN showed 0/4 lymph nodes positive.  The lumpectomy showed 1.2 cm invasive ductal carcinoma with positive inferior margin.  Otherwise she is doing well.  The patient denies any family history or breast problems. The only family history was a maternal aunt who was diagnosed with breast cancer in her 63's Menarche - 1 First pregnancy - 18 Breastfeed - no Oral contraceptives - 7 months - became pregnant on OCP No other hormones  CLINICAL DATA: Initial treatment strategy for right upper lobe pulmonary nodule.  EXAM: NUCLEAR MEDICINE PET SKULL BASE TO THIGH  TECHNIQUE: 8.6 mCi F-18 FDG was injected intravenously. Full-ring PET imaging was performed from the skull base to thigh after the radiotracer. CT data was obtained and used for attenuation correction and  anatomic localization.  FASTING BLOOD GLUCOSE: Value: 114 mg/dl  COMPARISON: 09/11/2015 chest CT.  FINDINGS: NECK  No hypermetabolic lymph nodes in the neck. Subcentimeter coarse calcification in the right thyroid lobe. No hypermetabolic thyroid nodules.  CHEST  There is an intensely hypermetabolic 1.4 x 1.2 cm right upper lobe pulmonary nodule (series 8/image 21) with max SUV 24.8.  No hypermetabolic axillary, mediastinal or hilar nodes. Mild centrilobular and paraseptal emphysema with diffuse bronchial wall thickening. No acute consolidative airspace disease. There are at least 4 scattered tiny pulmonary nodules throughout the right lung, largest 4 mm in the anterior right upper lobe (series 8/image 32), which are below PET resolution and are nonspecific, probably benign. Extensive coronary atherosclerosis status post CABG with left internal mammary and ascending aortic bypass grafts.  There is a hypermetabolic 1.0 cm soft tissue nodule in the far posterior left breast (series 5/image 76) with max SUV 4.1.  ABDOMEN/PELVIS  No abnormal hypermetabolic activity within the liver, pancreas, adrenal glands, or spleen. No hypermetabolic lymph nodes in the abdomen or pelvis. Non hypermetabolic 2.5 cm right adrenal mass with density 9 HU, in keeping with a benign adenoma. Ectasia of the atherosclerotic infrarenal abdominal aorta, maximum diameter 2.5 cm. Extensive subcutaneous soft tissue calcifications in the bilateral gluteal regions, most prominent overlying the right greater trochanter.  SKELETON  No focal hypermetabolic activity to suggest skeletal metastasis.  IMPRESSION: 1. Intensely hypermetabolic 1.4 cm right upper lobe pulmonary nodule, in keeping with a primary bronchogenic carcinoma. 2. No hypermetabolic thoracic nodal metastases. No hypermetabolic distant metastatic disease. PET-CT staging is stage IA (T1a N0 M0). 3.  Hypermetabolic 1.0 cm soft tissue nodule  in the far posterior left breast, which is nonspecific, cannot exclude a primary left breast malignancy. Recommend further evaluation with diagnostic mammography. 4. Additional findings include extensive coronary atherosclerosis status post CABG, mild emphysema with diffuse bronchial wall thickening suggesting COPD, right adrenal adenoma and ectatic atherosclerotic infrarenal abdominal aorta.   Electronically Signed By: Ilona Sorrel M.D. On: 10/11/2015 13:49  CLINICAL DATA: 70 year old female who recently had a PET study for a lung mass which showed a hypermetabolic 1 cm soft tissue nodule in the far posterior left breast.  EXAM: DIGITAL DIAGNOSTIC LEFT MAMMOGRAM WITH 3D TOMOSYNTHESIS WITH CAD  ULTRASOUND LEFT BREAST  COMPARISON: Previous exam(s).  ACR Breast Density Category b: There are scattered areas of fibroglandular density.  FINDINGS: Far posteriorly in the upper-outer quadrant of the left breast is a 10 mm spiculated mass. No additional masses are seen in the left breast. There are no malignant type microcalcifications.  Mammographic images were processed with CAD.  On physical exam, I do not palpate a mass in the upper-outer quadrant of the left breast or the axilla.  Targeted ultrasound is performed, showing a spiculated hypoechoic mass in the left breast at 2 o'clock 3 cm from the nipple measure 8 x 8 x 10 mm. Sonographic evaluation of the left axilla shows a 1.2 cm lymph node with a prominent cortex.  IMPRESSION: Suspicious mass in the 2 o'clock region of the left breast and suspicious left axillary lymph node.  RECOMMENDATION: Ultrasound-guided core biopsies of the left breast mass and left axillary lymph node is recommended. This will be scheduled at the patient's convenience.  I have discussed the findings and recommendations with the patient. Results were also provided in writing at the conclusion of the visit. If applicable, a reminder letter  will be sent to the patient regarding the next appointment.  BI-RADS CATEGORY 5: Highly suggestive of malignancy.   Electronically Signed By: Lillia Mountain M.D. On: 10/28/2015 16:10  CLINICAL DATA: 70 year old female with history of lung cancer and a mass in the left breast identified on a PET-CT presents for ultrasound-guided biopsy of this left breast mass and a left axillary lymph nodes.  EXAM: ULTRASOUND GUIDED LEFT BREAST CORE NEEDLE BIOPSY  COMPARISON: Previous exam(s).  FINDINGS: I met with the patient and we discussed the procedure of ultrasound-guided biopsy, including benefits and alternatives. We discussed the high likelihood of a successful procedure. We discussed the risks of the procedure, including infection, bleeding, tissue injury, clip migration, and inadequate sampling. Informed written consent was given. The usual time-out protocol was performed immediately prior to the procedure.  Using sterile technique and 1% Lidocaine as local anesthetic, under direct ultrasound visualization, a 14 Gauge spring-loaded device was used to perform biopsy of a mass in the upper-outer quadrant of the left breast using a lateral approach. At the conclusion of the procedure a ribbon shaped tissue marker clip was deployed into the biopsy cavity.  Using sterile technique and 1% Lidocaine as local anesthetic, under direct ultrasound visualization, a 14 gauge spring-loaded device was used to perform biopsy of left axillary lymph node using a lateral approach. At the conclusion of the procedure a HydroMARK tissue marker clip was deployed into the biopsy cavity. Follow up 2 view mammogram was performed and dictated separately.  IMPRESSION: Ultrasound guided biopsy of a mass in the upper outer quadrant of the left breast and a left axillary lymph node. No apparent complications.   Electronically Signed By: Ammie Ferrier M.D.  On: 11/04/2015 15:26  Diagnosis 1.  Breast, left, needle core biopsy, upper outer quadrant - INVASIVE DUCTAL CARCINOMA. - SEE COMMENT. 2. Lymph node, needle/core biopsy, left axilla - THERE IS NO EVIDENCE OF CARCINOMA IN 1 OF 1 LYMPH NODE (0/1). - SEE COMMENT. Microscopic Comment 1. and 2. The carcinoma in part 1 appears grade 2. A breast prognostic profile will be performed and the results reported separately. The results were called to [facility] on 11/05/15. (JBK:gt, 11/05/15) Enid Cutter MD Pathologist, Electronic Signature (Case signed 11/05/2015)  ADDITIONAL INFORMATION: 1. PROGNOSTIC INDICATORS Results: IMMUNOHISTOCHEMICAL AND MORPHOMETRIC ANALYSIS PERFORMED MANUALLY Estrogen Receptor: 100%, POSITIVE, STRONG STAINING INTENSITY Progesterone Receptor: 100%, POSITIVE, STRONG STAINING INTENSITY Proliferation Marker Ki67: 6% REFERENCE RANGE ESTROGEN RECEPTOR NEGATIVE 0% POSITIVE =>1% REFERENCE RANGE PROGESTERONE RECEPTOR NEGATIVE 0% POSITIVE =>1% All controls stained appropriately Enid Cutter MD Pathologist, Electronic Signature ( Signed 11/07/2015) 1. FLUORESCENCE IN-SITU HYBRIDIZATION Results: HER2 - NEGATIVE RATIO OF HER2/CEP17 SIGNALS 1.32 AVERAGE HER2 COPY NUMBER PER CELL 3.10  Specimen      Other Problems  Anxiety Disorder Back Pain Depression Diabetes Mellitus Hypercholesterolemia Lump In Breast Myocardial infarction Thyroid Disease  Past Surgical History Breast Biopsy Left. Colon Polyp Removal - Colonoscopy Coronary Artery Bypass Graft Foot Surgery Left. Hysterectomy (not due to cancer) - Complete Oral Surgery  Diagnostic Studies History Colonoscopy 5-10 years ago Mammogram within last year Pap Smear 1-5 years ago  Medication History  Amitriptyline HCl (50MG Tablet, Oral) Active. Atorvastatin Calcium (40MG Tablet, Oral) Active. Levothyroxine Sodium (88MCG Tablet, Oral) Active. Losartan Potassium-HCTZ (100-12.5MG Tablet, Oral)  Active. MetFORMIN HCl (500MG Tablet, Oral) Active. Metoprolol Tartrate (50MG Tablet, Oral) Active. Nitroglycerin (0.4MG Tab Sublingual, Sublingual) Active. Boniva (150MG Tablet, Oral) Active. Aspirin (81MG Tablet Chewable, Oral) Active. Medications Reconciled  Social History  Caffeine use Coffee, Tea. No alcohol use No drug use Tobacco use Former smoker.  Family History  Alcohol Abuse Brother. Arthritis Mother. Diabetes Mellitus Father. Heart Disease Father. Heart disease in female family member before age 49 Respiratory Condition Sister. Thyroid problems Mother, Sister.  Pregnancy / Birth History Age at menarche 45 years. Age of menopause 21-50 Gravida 2 Irregular periods Maternal age 68-20 Para 2     Review of Systems General Not Present- Appetite Loss, Chills, Fatigue, Fever, Night Sweats, Weight Gain and Weight Loss. Skin Not Present- Change in Wart/Mole, Dryness, Hives, Jaundice, New Lesions, Non-Healing Wounds, Rash and Ulcer. HEENT Present- Wears glasses/contact lenses. Not Present- Earache, Hearing Loss, Hoarseness, Nose Bleed, Oral Ulcers, Ringing in the Ears, Seasonal Allergies, Sinus Pain, Sore Throat, Visual Disturbances and Yellow Eyes. Respiratory Not Present- Bloody sputum, Chronic Cough, Difficulty Breathing, Snoring and Wheezing. Breast Present- Breast Mass. Not Present- Breast Pain, Nipple Discharge and Skin Changes. Cardiovascular Not Present- Chest Pain, Difficulty Breathing Lying Down, Leg Cramps, Palpitations, Rapid Heart Rate, Shortness of Breath and Swelling of Extremities. Gastrointestinal Not Present- Abdominal Pain, Bloating, Bloody Stool, Change in Bowel Habits, Chronic diarrhea, Constipation, Difficulty Swallowing, Excessive gas, Gets full quickly at meals, Hemorrhoids, Indigestion, Nausea, Rectal Pain and Vomiting. Female Genitourinary Not Present- Frequency, Nocturia, Painful Urination, Pelvic Pain and  Urgency. Musculoskeletal Present- Back Pain. Not Present- Joint Pain, Joint Stiffness, Muscle Pain, Muscle Weakness and Swelling of Extremities. Neurological Not Present- Decreased Memory, Fainting, Headaches, Numbness, Seizures, Tingling, Tremor, Trouble walking and Weakness. Psychiatric Present- Depression. Not Present- Anxiety, Bipolar, Change in Sleep Pattern, Fearful and Frequent crying. Endocrine Present- New Diabetes. Not Present- Cold Intolerance, Excessive Hunger, Hair Changes, Heat Intolerance and Hot flashes.  Hematology Not Present- Easy Bruising, Excessive bleeding, Gland problems, HIV and Persistent Infections.  Vitals  Weight: 168 lb Height: 62in Body Surface Area: 1.77 m Body Mass Index: 30.73 kg/m  Temp.: 44F(Temporal)  Pulse: 76 (Regular)  BP: 136/78 (Sitting, Left Arm, Standard)      Physical Exam  The physical exam findings are as follows: Note:WDWN in NAD HEENT: EOMI, sclera anicteric Neck: No masses, no thyromegaly; no palpable lymphadenopathy Lungs: CTA bilaterally; normal respiratory effort Breasts: symmetric except for some left lateral bruising after biopsy No palpable masses; no palpable lympadenopathy No nipple retraction or discharge CV: Regular rate and rhythm; no murmurs Abd: +bowel sounds, soft, non-tender, no masses Ext: Well-perfused; no edema Skin: Warm, dry; no sign of jaundice    Assessment & Plan Rodman Key K. Earley Grobe MD; 11/09/2015 10:06 AM)  INVASIVE DUCTAL CARCINOMA OF BREAST, LEFT (C50.912) Positive inferior margin Negative sentinel lymph nodes  Current Plans Invasive ductal carcinoma - left breast with positive inferior margins.  Recommend reexcision of inferior margin. The surgical procedure has been discussed with the patient. Potential risks, benefits, alternative treatments, and expected outcomes have been explained. All of the patient's questions at this time have been answered. The likelihood of reaching the  patient's treatment goal is good. The patient understand the proposed surgical procedure and wishes to proceed.       Denise Mckenzie. Georgette Dover, MD, [facility] Surgery  General/ Trauma Surgery  11/21/2015 3:54 PM

## 2015-12-03 NOTE — Op Note (Signed)
Preop diagnosis: Invasive ductal carcinoma left breast with positive inferior margin Postop diagnosis: Same Procedure performed reexcision of inferior margin left lumpectomy Surgeon:Marlyss Cissell K. Anesthesia: Gen. LMA and Pectoral block Indications:This is a 70 year old female who was recently diagnosed with right upper lobe lung cancer. On a PET CT scan she had a 1 cm area of hypermetabolism in her posterior left breast. Mammograms were performed as well as ultrasound. Core biopsy showed invasive ductal carcinoma ER and PR positive Ki-67 6% HER-2 negative. Clinically her axilla is negative. She underwent lumpectomy and sentinel lymph node biopsy on 11/19/15. The lymph nodes were negative. The lumpectomy had focally positive inferior margin. She presents now for reexcision. The plan is to treat this area with radiation followed by hormone therapy.  Description of procedure: The patient is brought to the operating room placed in a supine position on the operating room table. After adequate level of general anesthesia was obtained, her left chest was prepped with ChloraPrep and draped sterile fashion. Her previous incisions are both well-healed. A timeout was taken to ensure the proper patient and proper procedure. We infiltrated the area around the nipple with 0.25% Marcaine with epinephrine. We open the previous lumpectomy incision and dissected down into the large seroma cavity. We excised an additional 1 cm of the inferior margin down to the chest wall. The specimen was oriented with a paint kit. An additional clip was placed in the new inferior margin to help with radiation therapy. We irrigated the wound thoroughly and inspected for hemostasis. The wound was closed with 3-0 Vicryl 4-0 Monocryl. Steri-Strips and clean dressings were applied. The patient was then extubated and brought to the recovery room in stable condition. All sponge, instrument, and needle counts are correct.  Denise Mckenzie. Georgette Dover, MD,  [facility] Surgery  General/ Trauma Surgery  12/03/2015 8:09 AM

## 2015-12-04 ENCOUNTER — Encounter (HOSPITAL_COMMUNITY): Payer: Self-pay | Admitting: Surgery

## 2015-12-09 ENCOUNTER — Other Ambulatory Visit: Payer: Self-pay | Admitting: Oncology

## 2015-12-09 NOTE — Progress Notes (Unsigned)
[place]  Telephone:(336) [phone] Fax:(336) [phone]     ID: JETTY BERLAND DOB: Jan 18, 1955  MR#: [id number]  FIE#:[id number]  Patient Care Team: Lorene Dy, MD as PCP - General (Internal Medicine) Grace Isaac, MD as Consulting Physician (Cardiothoracic Surgery) Donnie Mesa, MD as Consulting Physician (General Surgery) Thea Silversmith, MD as Consulting Physician (Radiation Oncology) Chauncey Cruel, MD as Consulting Physician (Oncology) Thea Silversmith, MD as Consulting Physician (Radiation Oncology) Curt Bears, MD as Consulting Physician (Oncology) PCP: Myriam Jacobson, MD GYN: OTHER MD:  CHIEF COMPLAINT: Lung cancer, breast cancer  CURRENT TREATMENT: Awaiting definitive surgery   BREAST AND LUNG CANCER PRESENTING HISTORY:  LUNG CANCER Jaylin developed some cough early January 2017 and brought it to her primary care physician's attention. A chest x-ray was obtained 09/01/2014. This showed a vague lingular density measuring 1.4 cm. Further evaluation with a CT scan of the chest obtained 09/11/2015 found a 1.1 cm right upper lobe nodule. The area in the lingular density was felt to be scarring. There were other scattered nodules too small to characterize. There was no adenopathy and no bony lesions.  Accordingly on 10/11/2015 the patient underwent PET scan. The right upper lobe lung lesion was hypermetabolic, with an SUV max of 24.8. It measured 1.4 cm on the PET scan. At least for small pulmonary nodules were also again noted, the largest measuring 4 mm, all below PET resolution. There were felt to be likely benign. Incidentally a hypermetabolic 1 cm soft tissue nodule in the left breast was noted, with an SUV max of 4.1. A right adrenal mass measuring 2.5 cm was not hypermetabolic, consistent with a benign adenoma. CT scan of the head with and without contrast 10/22/2015 was benign  The patient's case was presented at the lung cancer conference.  Dr. Servando Snare comments that the patient's diffusion capacity was 28% and the FEV 1 was 47% of predicted, limiting surgical choices. Accordingly on 11/13/2015 the patient underwent video bronchoscopy with endobronchial navigation and right lung biopsy with marker placement The pathology from this procedure ([id number]) showed only blood clot and scant mucosal tissue. It was not diagnostic that it appears to be clearly discordant given the earlier PET results. The patient is scheduled for S ROS for definitive treatment of the lung lesion.  BREAST CANCER The incidentally noted left breast mass was further evaluated with bilateral diagnostic mammography with tomography and [facility] 10[facility]28[facility]2015. This found a breast density to be category be. In the upper-outer quadrant there was a 1.0 cm spiculated mass which was not palpable. Ultrasonography confirmed a spiculated hypoechoic mass at the 2:00 position of the left breast, 3 cm from the nipple, measuring 1.0 cm. In the left axilla there was a 1.2 cm lymph node with a prominent cortex.  Biopsy of the left breast mass in question 11/04/2015 showed ([id number]) invasive ductal carcinoma, grade 2, estrogen receptor 100% positive, progesterone receptor 100% positive, both with strong staining intensity, with an MIB-1 of 6%, and no HER-2 amplification, the signals ratio being 1.32 and the number per cell 3.10. The suspicious left axillary lymph node was biopsied at the same time and it showed no evidence of carcinoma. This was felt to be concordant.  On 11/19/2015 Izora Gala underwent left lumpectomy with sentinel lymph node sampling. The final pathology ([id number]) confirmed an invasive ductal carcinoma, grade 2, measuring 1.2 cm. Invasive disease was focally present at the inferior margin. In situ disease was less  than 0.1 cm to the inferior margin as well. All 4 sentinel lymph nodes were clear. Repeat HER-2 was again negative with a  signals ratio of 1.43, the number per cell being 3.50.  Her subsequent history is as detailed below.  INTERVAL HISTORY: Bexlee was evaluated in the breast clinic for 08/05/2016 accompanied by her niece Helene Kelp. The patient has already met with Dr. Servando Snare and Earlie Server regarding her lung cancer and will meet with Dr. Pablo Ledger shortly regarding radiation options.  REVIEW OF SYSTEMS: Sandhya tolerated her surgery well. She is concerned about having to have yet more surgery next week but understands the need for it. She is frustrated that she continues to gain weight. She tells me even though her pulmonary function tests were very poor her own interpretation is that things are not so bad and that she was probably just a bit anxious that day. She sleeps poorly. She needs new glasses. She has a full set of dentures and she says that it fairly well. She sleeps on 2 pillows. She does have arthritis and joint pains here and there but these are not more intense or persistent than before. The do keep her from walking what much". She admits to being depressed as well as anxious. She thinks her blood sugars are well controlled but she has not been checking them lately. A detailed review of systems today was otherwise stable  Past Medical History  Diagnosis Date  . Coronary artery disease   . Hyperlipidemia   . Hypertension   . Hypothyroidism 10/22/2015  . Osteoporosis 10/22/2015  . Diabetes mellitus ([place]) 10/22/2015  . Myocardial infarction [facility]) July 1995  . COPD (chronic obstructive pulmonary disease) ([place])   . Depression   . Arthritis   . Cancer ([place])     lung cancer  . Full dentures     full upper dentures, plate on bottom  . History of pneumonia   . History of bronchitis   . Stress incontinence     PAST SURGICAL HISTORY: Past Surgical History  Procedure Laterality Date  . Coronary artery bypass graft      11/1994  . Abdominal hysterectomy      1968  . 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 Bladder surgery      1972  . Tmj arthroplasty      1979  . Tonsillectomy    . Cardiac catheterization  1995, 1996    Dr. Wynonia Lawman saw here then here at [facility] both times; 'they did a balloon and opened it up' first time, the 2nd was scheduled d/t abnormal stress test  . Appendectomy      with hysterectomy  . Colonoscopy w/ polypectomy    . Multiple tooth extractions    . Video bronchoscopy with endobronchial navigation N/A 11/13/2015    Procedure: VIDEO BRONCHOSCOPY WITH ENDOBRONCHIAL NAVIGATION, with placement of fudicial markers;  Surgeon: Grace Isaac, MD;  Location: [facility] OR;  Service: Thoracic;  Laterality: N/A;  . Lung biopsy N/A 11/13/2015    Procedure: RIGHT LUNG BIOPSY;  Surgeon: Grace Isaac, MD;  Location: [facility] OR;  Service: Thoracic;  Laterality: N/A;  . Radioactive seed guided mastectomy with axillary sentinel lymph node biopsy Left 11/19/2015    Procedure: RADIOACTIVE SEED GUIDED PARTIAL MASTECTOMY WITH AXILLARY SENTINEL LYMPH NODE BIOPSY;  Surgeon: Donnie Mesa, MD;  Location: [facility];  Service: General;  Laterality: Left;  . Re-excision of  breast lumpectomy Left 12/03/2015    Procedure: RE-EXCISION INFERIOR MARGIN OF LEFT BREAST LUMPECTOMY;  Surgeon: Donnie Mesa, MD;  Location: [facility] OR;  Service: General;  Laterality: Left;    FAMILY HISTORY Family History  Problem Relation Age of Onset  . Alzheimer's disease Mother   . Heart attack Father   The patient's father died from a heart attack at age 3. The patient's mother died with Alzheimer's disease at age 43. Kinzleigh has one brother and one sister. The only cancer in the family was breast cancer in a maternal aunt diagnosed in her early 21s.  GYNECOLOGIC HISTORY:  No LMP recorded. Patient has had a hysterectomy. Menarche age 61, first live birth age 60. The patient is GX P2. She underwent total abdominal hysterectomy with bilateral salpingo-oophorectomy 1968. She did not take  hormone replacement.  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 Marya Amsler lives in [place]. The patient's daughter Sherri Rad lives in [place] and works in Programmer, applications. The patient has 3 grandchildren and 2 great-grandchildren. She attends a [employer] in [place]: In place   HEALTH MAINTENANCE: Social History  Substance Use Topics  . Smoking status: Former Smoker    Quit date: 08/17/1994  . Smokeless tobacco: Not on file  . Alcohol Use: No     Colonoscopy:2010/[facility]  PAP: 2015  Bone density: 2016/[facility]  Lipid panel:  No Known Allergies  Current Outpatient Prescriptions  Medication Sig Dispense Refill  . acetaminophen (TYLENOL) 500 MG tablet Take 1,000 mg by mouth 2 (two) times daily as needed (pain).    Marland Kitchen amitriptyline (ELAVIL) 50 MG tablet Take 50 mg by mouth at bedtime.      Marland Kitchen aspirin EC 325 MG tablet Take 325 mg by mouth daily.    Marland Kitchen atorvastatin (LIPITOR) 40 MG tablet Take 40 mg by mouth at bedtime.     . Calcium Citrate-Vitamin D (CALCIUM + D PO) Take 2 tablets by mouth 3 (three) times daily. Each tablet Calcium 300 mg, Vitamin D3 250 I.U.    . HYDROcodone-acetaminophen (NORCO/VICODIN) 5-325 MG tablet Take 1 tablet by mouth every 4 (four) hours as needed. (Patient taking differently: Take 1 tablet by mouth every 4 (four) hours as needed for moderate pain. ) 40 tablet 0  . HYDROcodone-acetaminophen (NORCO/VICODIN) 5-325 MG tablet Take 1 tablet by mouth every 4 (four) hours as needed. 40 tablet 0  . ibandronate (BONIVA) 150 MG tablet Take 150 mg by mouth every 30 (thirty) days. On the 4th Monday of each month  4  . Krill Oil 1000 MG CAPS Take 1,000 mg by mouth daily.    Marland Kitchen levothyroxine (SYNTHROID, LEVOTHROID) 88 MCG tablet Take 88 mcg by mouth daily before breakfast.     . losartan-hydrochlorothiazide  (HYZAAR) 100-12.5 MG per tablet Take 1 tablet by mouth daily.      . metFORMIN (GLUCOPHAGE) 500 MG tablet Take 500 mg by mouth daily.  4  . metoprolol (LOPRESSOR) 50 MG tablet Take 25 mg by mouth 2 (two) times daily.      . nitroGLYCERIN (NITROSTAT) 0.4 MG SL tablet Place 0.4 mg under the tongue every 5 (five) minutes as needed for chest pain.      No current facility-administered medications for this visit.    OBJECTIVE: Middle-aged white woman who appears stated age There were no  vitals filed for this visit.   There is no weight on file to calculate BMI.    ECOG FS:1 - Symptomatic but completely ambulatory  Ocular: Sclerae unicteric, EOMs intact Ear-nose-throat: Oropharynx clear and moist; full upper and lower plates Lymphatic: No cervical or supraclavicular adenopathy Lungs no rales or rhonchi, good excursion bilaterally Heart regular rate and rhythm, no murmur appreciated Abd soft, nontender, positive bowel sounds MSK no focal spinal tenderness, no joint edema Neuro: non-focal, well-oriented, appropriate affect Breasts: The patient has a complex bandage in place and this was not on done today for evaluation. Both axillae are benign   LAB RESULTS:  CMP     Component Value Date/Time   NA 139 11/27/2015 1547   NA 133* 11/11/2015 1535   K 3.6 11/27/2015 1547   K 3.8 11/11/2015 1535   CL 95* 11/11/2015 1535   CO2 33* 11/27/2015 1547   CO2 26 11/11/2015 1535   GLUCOSE 88 11/27/2015 1547   GLUCOSE 95 11/11/2015 1535   BUN 10.7 11/27/2015 1547   BUN 8 11/11/2015 1535   CREATININE 0.9 11/27/2015 1547   CREATININE 0.77 11/11/2015 1535   CALCIUM 9.7 11/27/2015 1547   CALCIUM 9.8 11/11/2015 1535   PROT 7.3 11/27/2015 1547   PROT 7.5 11/11/2015 1535   ALBUMIN 3.6 11/27/2015 1547   ALBUMIN 4.2 11/11/2015 1535   AST 18 11/27/2015 1547   AST 19 11/11/2015 1535   ALT 16 11/27/2015 1547   ALT 19 11/11/2015 1535   ALKPHOS 90 11/27/2015 1547   ALKPHOS 98 11/11/2015 1535   BILITOT  0.39 11/27/2015 1547   BILITOT 0.4 11/11/2015 1535   GFRNONAA >60 11/11/2015 1535   GFRAA >60 11/11/2015 1535    INo results found for: SPEP, UPEP  Lab Results  Component Value Date   WBC 11.0* 11/27/2015   NEUTROABS 7.5* 11/27/2015   HGB 14.8 11/27/2015   HCT 44.2 11/27/2015   MCV 91.1 11/27/2015   PLT 220 11/27/2015      Chemistry      Component Value Date/Time   NA 139 11/27/2015 1547   NA 133* 11/11/2015 1535   K 3.6 11/27/2015 1547   K 3.8 11/11/2015 1535   CL 95* 11/11/2015 1535   CO2 33* 11/27/2015 1547   CO2 26 11/11/2015 1535   BUN 10.7 11/27/2015 1547   BUN 8 11/11/2015 1535   CREATININE 0.9 11/27/2015 1547   CREATININE 0.77 11/11/2015 1535      Component Value Date/Time   CALCIUM 9.7 11/27/2015 1547   CALCIUM 9.8 11/11/2015 1535   ALKPHOS 90 11/27/2015 1547   ALKPHOS 98 11/11/2015 1535   AST 18 11/27/2015 1547   AST 19 11/11/2015 1535   ALT 16 11/27/2015 1547   ALT 19 11/11/2015 1535   BILITOT 0.39 11/27/2015 1547   BILITOT 0.4 11/11/2015 1535       No results found for: LABCA2  No components found for: LABCA125  No results for input(s): INR in the last 168 hours.  Urinalysis No results found for: COLORURINE, APPEARANCEUR, LABSPEC, PHURINE, GLUCOSEU, HGBUR, BILIRUBINUR, KETONESUR, PROTEINUR, UROBILINOGEN, NITRITE, LEUKOCYTESUR    ELIGIBLE FOR AVAILABLE RESEARCH PROTOCOL: No  STUDIES: Dg Chest 2 View  11/11/2015  CLINICAL DATA:  Preproceduralchest x-ray. Diabetes. History of CABG. EXAM: CHEST  2 VIEW COMPARISON:  PET-CT 10/11/2015.  Chest x-ray 09/02/2015. FINDINGS: Mediastinum and hilar structures normal. Prior CABG. Heart size normal. Right upper lobe nodular lesion again noted. No pleural effusion or pneumothorax. IMPRESSION: 1. Persistent nodular  density right upper lobe as noted on prior PET-CT. 2. Prior CABG.  Heart size normal. Electronically Signed   By: Marcello Moores  Register   On: 11/11/2015 16:19   Nm Sentinel Node Inj-no Rpt  (breast)  11/19/2015  CLINICAL DATA: left invasive ductal carcinoma Sulfur colloid was injected intradermally by the nuclear medicine technologist for breast cancer sentinel node localization.   Mm Breast Surgical Specimen  11/19/2015  CLINICAL DATA:  Left lumpectomy for breast cancer. EXAM: SPECIMEN RADIOGRAPH OF THE LEFT BREAST COMPARISON:  Previous exam(s). FINDINGS: Status post excision of the left breast. The mass, radioactive seed and biopsy marker clip are present, completely intact, and were marked for pathology. IMPRESSION: Specimen radiograph of the left breast. Electronically Signed   By: Claudie Revering M.D.   On: 11/19/2015 13:33   Dg Chest Port 1 View  11/13/2015  CLINICAL DATA:  Status post lung biopsy. EXAM: PORTABLE CHEST 1 VIEW COMPARISON:  10/22/2015 FINDINGS: The patient is status post median sternotomy and CABG procedure. The heart size is normal. No airspace consolidation. Right upper lobe pulmonary nodule is again identified. There is no pneumothorax following bronchoscopy. IMPRESSION: No pneumothorax identified status post bronchoscopy and biopsy of right upper lobe lung nodule. Electronically Signed   By: Kerby Moors M.D.   On: 11/13/2015 13:23   Mm Digital Diagnostic Unilat L  11/18/2015  CLINICAL DATA:  Recent diagnosis of left breast cancer. The lesion is far posterior 2 o'clock position and best visualized on ultrasound. Therefore, localization with ultrasound is performed. EXAM: ULTRASOUND GUIDED RADIOACTIVE SEED LOCALIZATION OF THE LEFT BREAST COMPARISON:  Previous exam(s). FINDINGS: Patient presents for radioactive seed localization prior to lumpectomy. I met with the patient and we discussed the procedure of seed localization including benefits and alternatives. We discussed the high likelihood of a successful procedure. We discussed the risks of the procedure including infection, bleeding, tissue injury and further surgery. We discussed the low dose of radioactivity involved  in the procedure. Informed, written consent was given. The usual time-out protocol was performed immediately prior to the procedure. Using ultrasound guidance, sterile technique, 1% lidocaine and an I-125 radioactive seed, an irregular mass with associated biopsy clip in the 2 o'clock position of the left breast, posterior was localized using a lateral to medial approach. The follow-up mammogram image in the MLO projection confirms the seed in the expected location and was marked for Dr. Georgette Dover. A CC projection was not performed, as the mass was not seen on post clip images performed recently in the CC projection. Ultrasound pictures were also taken, confirming the seed within the mass. Follow-up survey of the patient confirms presence of the radioactive seed. Order number of I-125 seed:  [id number]. Total activity: 8.338 millicuries Reference Date: 08 November 2015 The patient tolerated the procedure well and was released from the [facility]. She was given instructions regarding seed removal. IMPRESSION: Radioactive seed localization left breast. No apparent complications. Electronically Signed   By: Curlene Dolphin M.D.   On: 11/18/2015 16:33   [place] Lt Radioactive Seed Loc  11/18/2015  CLINICAL DATA:  Recent diagnosis of left breast cancer. The lesion is far posterior 2 o'clock position and best visualized on ultrasound. Therefore, localization with ultrasound is performed. EXAM: ULTRASOUND GUIDED RADIOACTIVE SEED LOCALIZATION OF THE LEFT BREAST COMPARISON:  Previous exam(s). FINDINGS: Patient presents for radioactive seed localization prior to lumpectomy. I met with the patient and we discussed the procedure of seed localization including benefits and alternatives. We discussed the high likelihood  of a successful procedure. We discussed the risks of the procedure including infection, bleeding, tissue injury and further surgery. We discussed the low dose of radioactivity involved in the procedure. Informed, written  consent was given. The usual time-out protocol was performed immediately prior to the procedure. Using ultrasound guidance, sterile technique, 1% lidocaine and an I-125 radioactive seed, an irregular mass with associated biopsy clip in the 2 o'clock position of the left breast, posterior was localized using a lateral to medial approach. The follow-up mammogram image in the MLO projection confirms the seed in the expected location and was marked for Dr. Georgette Dover. A CC projection was not performed, as the mass was not seen on post clip images performed recently in the CC projection. Ultrasound pictures were also taken, confirming the seed within the mass. Follow-up survey of the patient confirms presence of the radioactive seed. Order number of I-125 seed:  [id number]. Total activity: 0.093 millicuries Reference Date: 08 November 2015 The patient tolerated the procedure well and was released from the [facility]. She was given instructions regarding seed removal. IMPRESSION: Radioactive seed localization left breast. No apparent complications. Electronically Signed   By: Curlene Dolphin M.D.   On: 11/18/2015 16:33   Dg C-arm Bronchoscopy  11/13/2015  CLINICAL DATA:  C-ARM BRONCHOSCOPY Fluoroscopy was utilized by the requesting physician.  No radiographic interpretation.    ASSESSMENT: 70 y.o. [place] woman  (1) hypermetabolic right upper lobe lung mass noted on chest CT scan 09/11/2015, clinically T1a N0  (a) biopsy by video bronchoscopy 11/13/2015 nondiagnostic  (b) definitive stereotactic radiosurgery pending  (2) biopsy of a left upper outer quadrant breast mass 10/28/2015 showed a clinical T1c N0, stage IA invasive ductal carcinoma, grade 2, estrogen and progesterone strongly positive, HER-2 negative, with an MIB-1 of 6%.  (3) status post left lumpectomy and sentinel lymph node sampling 11/19/2015 for apT1c pN0, stage IA invasive ductal carcinoma, grade 2, repeat HER-2 again negative, with a focally  positive inferior margin  (a) additional surgery for margin clearance is scheduled for 12/03/2015  (4) Adjuvant radiation to follow breast surgery  (5) to start anastrozole at the completion of local breast surgery   PLAN: We spent the better part of today's 50 minute appointment discussing the biology of breast cancer in general, and the specifics of the patient's tumor in particular. Ercia understands she has a very early stage breast cancer which appears slow-growing. We discussed the difference between local and systemic therapy, and in terms of local treatment she has already had her lumpectomy and will have margin clearance surgery next week. Assuming that is successful she will proceed to adjuvant radiation.  She will then be ready for systemic treatment. We discussed the options which include chemotherapy, anti-HER-2 immunotherapy, and anti-estrogens. She is not a candidate for anti-HER-2 immunotherapy since her breast cancer did not show HER-2 amplification. She could receive chemotherapy, but this generally is only marginally effective in slow-growing tumors like hers. Given the very early stage, her age and multiple other comorbidities, I am comfortable forgoing the minimal if any benefit she might receive from chemotherapy.  Accordingly her only systemic treatment will be anti-estrogens. I think she would be a particularly good candidate for anastrozole and we briefly discussed the possible toxicities, side effects and complications of this agent, which we are however not going to start until after she completes radiation.  She will return to see me in approximately 6 weeks. She should be done with her lung and breast irradiation by  then. Since she is already on ibandronate I don't think we need to be terribly concerned about bone density issues. Nevertheless we will review her bone density from last year.  Amany  has a good understanding of the overall plan. She agrees with it. She knows  the goal of treatment in her case is cure. She will call with any problems that may develop before her next visit here.  Chauncey Cruel, MD   12/09/2015 7:15 AM Medical Oncology and Hematology [facility] [address] [place], [place] [address] Tel. [phone]    Fax. [phone]

## 2015-12-25 ENCOUNTER — Ambulatory Visit: Payer: Medicare Other | Admitting: Internal Medicine

## 2015-12-25 ENCOUNTER — Other Ambulatory Visit: Payer: Medicare Other

## 2015-12-25 NOTE — Progress Notes (Signed)
Location of Breast Cancer:Upper-outer quadrant of left female breast  Histology per Pathology Report: Diagnosis  11-19-15 1. 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 - DUCTAL CARCINOMA IN SITU, LOW GRADE. - INVASIVE DUCTAL CARCINOMA IS FOCALLY PRESENT AT THE INFERIOR MARGIN. - DUCTAL CARCINOMA IN SITU IS FOCALLY LESS THAN 0.1 CM TO THE INFERIOR MARGIN. - SEE ONCOLOGY TABLE BELOW. 2. Lymph node, sentinel, biopsy, left axillary #1 - THERE IS NO EVIDENCE OF CARCINOMA IN 1 OF 1 LYMPH NODE (0/1). 3. Lymph node, sentinel, biopsy, left axillary #2 - THERE IS NO EVIDENCE OF CARCINOMA IN 1 OF 1 LYMPH NODE (0/1). 4. Lymph node, sentinel, biopsy, left axillary #3 1 of 4 FINAL for [name], Denise S ([id number]) Diagnosis(continued) - THERE IS NO EVIDENCE OF CARCINOMA IN 1 OF 1 LYMPH NODE (0/1). 5. Lymph node, sentinel, biopsy, left axillary #4 - THERE IS NO EVIDENCE OF CARCINOMA IN 1 OF 1 LYMPH NODE (0/1). Receptor Status: ER(100% +), PR (100%+), Her2-neu (-), Ki-(6%)  Denise Mckenzie present with abnormal mammogram screening to left breast.  Past/Anticipated interventions by surgeon, if PRX:YVOPFY to left breast.  Past/Anticipated interventions by medical oncology, if TWK:MQKMMNOTR referral,Anastrozole  Lymphedema issues, if any: None  Pain issues, if any: No   SAFETY ISSUES:  Prior radiation? :No  Pacemaker/ICD?:No  Possible current pregnancy?: No  Is the patient on methotrexate? :No  Menarch 9,G2,P2,Menopause 22,BC 3 months, HRT No     12/25/2015,  Thoracic Location of Tumor / Histology: Right Lung  Denise Mckenzie had a routine CXR and they saw a nodule in the right lung.   Biopsies of (if applicable) revealed: Diagnosis 11-13-15 Lung, biopsy, Right Upper Lobe Lung Mass - BLOOD CLOT AND SCANT MUCOSAL TISSUE. - SEE MICROSCOPIC DESCRIPTION  Tobacco/Marijuana/Snuff/ETOH use: Quit 2002 smoked 30+ years 3/4 pack/day  Past/Anticipated  interventions by cardiothoracic surgery, if any: Biopsy right lung  Past/Anticipated interventions by medical oncology, if any: Radiation referal  Signs/Symptoms Weight changes, if any:  Wt Readings from Last 3 Encounters:  12/26/15 165 lb 8 oz (75.07 kg)  12/03/15 164 lb (74.39 kg)  12/02/15 165 lb 6.4 oz (75.025 kg)    Respiratory complaints, if RNH:AFBXUX SOB and coughing  Hemoptysis, if any:No  Pain issues, if any:  No  SAFETY ISSUES:  Prior radiation? No  Pacemaker/ICD?:No  Possible current pregnancy?No  Is the patient on methotrexate? No  Current Complaints / other details:  Here to discuss radiation therapy plan BP 111/63 mmHg  Pulse 72  Temp(Src) 98 F (36.7 C) (Oral)  Resp 18  Ht '5\' 2"'  (1.575 m)  Wt 165 lb 8 oz (75.07 kg)  BMI 30.26 kg/m2  SpO2 99%

## 2015-12-26 ENCOUNTER — Ambulatory Visit
Admission: RE | Admit: 2015-12-26 | Discharge: 2015-12-26 | Disposition: A | Discharge status: Home / Self Care | Payer: Medicare Other | Source: Ambulatory Visit | Attending: Radiation Oncology | Admitting: Radiation Oncology

## 2015-12-26 ENCOUNTER — Encounter: Payer: Self-pay | Admitting: Radiation Oncology

## 2015-12-26 VITALS — BP 111/63 | HR 72 | Temp 98.0°F | Resp 18 | Ht 62.0 in | Wt 165.5 lb

## 2015-12-26 DIAGNOSIS — C50412 Malignant neoplasm of upper-outer quadrant of left female breast: Secondary | ICD-10-CM

## 2015-12-26 DIAGNOSIS — C50912 Malignant neoplasm of unspecified site of left female breast: Secondary | ICD-10-CM | POA: Diagnosis present

## 2015-12-26 DIAGNOSIS — Z510 Encounter for antineoplastic radiation therapy: Secondary | ICD-10-CM | POA: Diagnosis present

## 2015-12-26 DIAGNOSIS — R918 Other nonspecific abnormal finding of lung field: Secondary | ICD-10-CM | POA: Diagnosis not present

## 2015-12-26 NOTE — Progress Notes (Signed)
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 female who had a left lumpectomy on 11/19/15. This showed invasive ductal carcinoma, measuring 1.2 cm, and there was a focally positive inferior margin. 4 lymph nodes were negative. Re-excision of the margin on 4/18 revealed focal ADH. She also underwent a bronchoscopy on 11/13/15 of a right upper lobe mass. This was non-diagnositic with only atypical cells present. She was felt not to be a surgical candidate and was referred to radiation for definitive treatment of this right upper lobe lesion.  PREVIOUS RADIATION THERAPY: No  Past medical, social and family history were reviewed in the electronic chart. Review of symptoms was reviewed in the electronic chart. Medications were reviewed in the electronic chart.   PHYSICAL EXAM:  Filed Vitals:   12/26/15 1015  BP: 111/63  Pulse: 72  Temp: 98 F (36.7 C)  Resp: 18  .165 lb 8 oz (75.07 kg). Alert and oriented x 3. In no acute distress. Her incision is healing well along the left areola and axilla.  IMPRESSION: 70 y.o. 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 We discussed the equivalence in terms of survival and local failure between mastectomy and breast conservation. We discussed the role of radiation in decreasing local failures in patients who undergo lumpectomy. We discussed the process of simulation and the placement tattoos.  We discussed 4-6 weeks of treatment as an outpatient. We discussed the possibility of asymptomatic lung damage. We discussed the low likelihood of secondary malignancies. We discussed the possible side effects including but not limited to skin redness, fatigue, permanent skin darkening, and breast swelling.  We discussed that she has stage I lung cancer and the results of SBRT with provide a greater than 90% local control. We discussed that she may fail in the mediastinum hilum or elsewhere in the body and radiation was a local only process. We discussed the process of simulation and the use of a pad all to decrease respiratory motion. We discussed the use of 4-dimensional simulation to minimize normal lung tissue treated and the use of respiratory compression. We discussed 3- 5 treatments occurring every other day as an outpatient. We discussed these treatments will last about 10-20 minutes and she would be here at the hospital for about an hour. We discussed that SBRT was unlikely to make her breathing any worse. It is also unlikely to make her breathing symptoms any better. We discussed possible side effects including his shoulder pain due to arm positioning and possible rib fracture withthe pleural-based nodules proximity to the ribs. We discussed damage to other critical normal structures including heart, ribs, lung collapse, chronic cough, and brachial plexus injury. We discussed that without treatment this could develop into a more aggressive or even metastatic cancer.  The patient signed a consent form and this was placed in her medical chart. CT simulation will be scheduled next week on May 16, at [facility].  I spent  40 minutes  face to face with the patient and more than 50% of that time was spent in counseling and/or coordination of care.   ------------------------------------------------  Thea Silversmith, MD  This document serves as a record of services personally performed by Thea Silversmith, MD.  It was created on her behalf by Darcus Austin, a trained medical scribe. The creation of this record is based on the scribe's personal observations and the provider's statements to them. This document has been checked and approved by the attending provider.

## 2015-12-27 NOTE — Addendum Note (Signed)
Encounter addended by: Malena Edman, RN on: 12/27/2015  5:04 PM<BR>     Documentation filed: Charges VN

## 2015-12-31 ENCOUNTER — Ambulatory Visit
Admission: RE | Admit: 2015-12-31 | Discharge: 2015-12-31 | Disposition: A | Discharge status: Home / Self Care | Payer: Medicare Other | Source: Ambulatory Visit | Attending: Radiation Oncology | Admitting: Radiation Oncology

## 2015-12-31 ENCOUNTER — Ambulatory Visit: Admission: RE | Admit: 2015-12-31 | Payer: Medicare Other | Source: Ambulatory Visit | Admitting: Radiation Oncology

## 2015-12-31 DIAGNOSIS — C50412 Malignant neoplasm of upper-outer quadrant of left female breast: Secondary | ICD-10-CM

## 2015-12-31 DIAGNOSIS — Z510 Encounter for antineoplastic radiation therapy: Secondary | ICD-10-CM | POA: Diagnosis not present

## 2015-12-31 NOTE — Progress Notes (Signed)
Radiation Oncology         [phone] ________________________________  Name: Denise Mckenzie      MRN: [id number]          Date: 12/31/2015              DOB: 09/23/1945  Optical Surface Tracking Plan:  Since intensity modulated radiotherapy (IMRT) and 3D conformal radiation treatment methods are predicated on accurate and precise positioning for treatment, intrafraction motion monitoring is medically necessary to ensure accurate and safe treatment delivery.  The ability to quantify intrafraction motion without excessive ionizing radiation dose can only be performed with optical surface tracking. Accordingly, surface imaging offers the opportunity to obtain 3D measurements of patient position throughout IMRT and 3D treatments without excessive radiation exposure.  I am ordering optical surface tracking for this patient's upcoming course of radiotherapy.  ------------------------------------------------  Thea Silversmith, MD   Reference:   Particia Jasper, et al. Surface imaging-based analysis of intrafraction motion for breast radiotherapy patients.Journal of [place], n. 6, nov. 2014. ISSN [id number].   Available at: <http://www.jacmp.org/index.php/jacmp/article/view/4957>.

## 2015-12-31 NOTE — Progress Notes (Signed)
[place] [facility] Simulation and Treatment Planning Note   Name: Denise Mckenzie MRN: [id number]  Date: 12/31/2015  DOB: May 31, 1946   DIAGNOSIS: There were no encounter diagnoses.   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  Identity was confirmed.  All relevant records and images related to the planned course of therapy were reviewed.  Then, the patient was positioned in a stable reproducible clinical set-up for radiation therapy.  CT images were obtained.  Skin markings were placed.  A four dimensional simulation was then performed to track tumor movement throughout the patients' breathing cycle. The CT images were loaded into the planning software where the target and avoidance structures were contoured.  The GTV was outlined on the free breathing, 4D and MIP image sets.  The radiation prescription was entered and confirmed.   TREATMENT PLANNING NOTE:  Treatment planning then occurred. I have requested 3D simulation with [facility] of the spinal cord, total lungs and gross tumor volume. I have also requested mlcs and an isodose plan.   Special treatment procedure will be performed as Charlsie Merles will be receiving high dose per fraction.    A total of 4 complex treatment devices will be used  ------------------------------------------------  Thea Silversmith, MD  This document serves as a record of services personally performed by Thea Silversmith, MD. It was created on her behalf by Derek Mound, a trained medical scribe. The creation of this record is based on the scribe's personal observations and the provider's statements to them. This document has been  checked and approved by the attending provider.

## 2015-12-31 NOTE — Progress Notes (Signed)
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  NARRATIVE: [name] was brought to the [place].  Identity was confirmed.  All relevant records and images related to the planned course of therapy were reviewed.  Then, the patient was positioned in a stable reproducible clinical set-up for radiation therapy.  Wires were placed to delineate the clinical extent of breast tissue. A wire was placed on the scar as well.  CT images were obtained.  An isocenter was placed. Skin markings were placed.  The CT images were loaded into the planning software where the target and avoidance structures were contoured.  The radiation prescription was entered and confirmed. The patient was discharged in stable condition and tolerated simulation well.    TREATMENT PLANNING NOTE:  Treatment planning then occurred. I have requested : MLC's, isodose plan, basic dose calculation  I personally designed and supervised the construction of 5 medically necessary complex treatment devices for the protection of critical normal structures including the lungs and contralateral breast as well as the immobilization device which is necessary for set up certainty.   3D simulation occurred. I requested and analyzed a dose volume histogram of the heart, lungs and lumpectomy cavity.   ------------------------------------------------  Thea Silversmith, MD   This document serves as a record of services personally performed by Thea Silversmith, MD. It was created on her behalf by Derek Mound, a trained medical scribe. The creation of this record is based on the scribe's personal  observations and the provider's statements to them. This document has been checked and approved by the attending provider.

## 2016-01-01 ENCOUNTER — Ambulatory Visit: Payer: Medicare Other | Admitting: Radiation Oncology

## 2016-01-03 DIAGNOSIS — Z510 Encounter for antineoplastic radiation therapy: Secondary | ICD-10-CM | POA: Diagnosis not present

## 2016-01-07 ENCOUNTER — Ambulatory Visit
Admission: RE | Admit: 2016-01-07 | Discharge: 2016-01-07 | Disposition: A | Discharge status: Home / Self Care | Payer: Medicare Other | Source: Ambulatory Visit | Attending: Radiation Oncology | Admitting: Radiation Oncology

## 2016-01-07 DIAGNOSIS — Z510 Encounter for antineoplastic radiation therapy: Secondary | ICD-10-CM | POA: Diagnosis not present

## 2016-01-08 ENCOUNTER — Ambulatory Visit
Admission: RE | Admit: 2016-01-08 | Discharge: 2016-01-08 | Disposition: A | Discharge status: Home / Self Care | Payer: Medicare Other | Source: Ambulatory Visit | Attending: Radiation Oncology | Admitting: Radiation Oncology

## 2016-01-08 DIAGNOSIS — Z510 Encounter for antineoplastic radiation therapy: Secondary | ICD-10-CM | POA: Diagnosis not present

## 2016-01-08 NOTE — Progress Notes (Signed)
Cardiology Office Note   Date:  01/11/2016   ID:  Denise Mckenzie, DOB 09/29/1945, MRN [id number]  PCP:  Myriam Jacobson, MD  Cardiologist:   Dorris Carnes, MD   F/U of CAD      History of Present Illness: Denise Mckenzie is a 70 y.o. female with a history of CAD (CABG in 1996), HTN, HL and DM   I saw her in March wit [facility] [place] for preop eval prior to possible lung surgery She was felt to be rel low risk  No testing was planned  Since seen in clinic she has undergone bronch with washings  Not conclusive  Currently undergoing radiation  She has started this and gone through 4 sessions  She complains of fatigue  She denies CP  Breathing is stable         Outpatient Prescriptions Prior to Visit  Medication Sig Dispense Refill  . acetaminophen (TYLENOL) 500 MG tablet Take 1,000 mg by mouth 2 (two) times daily as needed (pain).    Marland Kitchen amitriptyline (ELAVIL) 50 MG tablet Take 50 mg by mouth at bedtime.      Marland Kitchen aspirin EC 325 MG tablet Take 325 mg by mouth daily.    Marland Kitchen atorvastatin (LIPITOR) 40 MG tablet Take 40 mg by mouth at bedtime.     . Calcium Citrate-Vitamin D (CALCIUM + D PO) Take 2 tablets by mouth 3 (three) times daily. Each tablet Calcium 300 mg, Vitamin D3 250 I.U.    . ibandronate (BONIVA) 150 MG tablet Take 150 mg by mouth every 30 (thirty) days. On the 4th Monday of each month  4  . Krill Oil 1000 MG CAPS Take 1,000 mg by mouth daily.    Marland Kitchen levothyroxine (SYNTHROID, LEVOTHROID) 88 MCG tablet Take 88 mcg by mouth daily before breakfast.     . losartan-hydrochlorothiazide (HYZAAR) 100-12.5 MG per tablet Take 1 tablet by mouth daily.      . metFORMIN (GLUCOPHAGE) 500 MG tablet Take 500 mg by mouth daily.  4  . metoprolol (LOPRESSOR) 50 MG tablet Take 25 mg by mouth 2 (two) times daily.      . nitroGLYCERIN (NITROSTAT) 0.4 MG SL tablet Place 0.4 mg under the tongue every 5 (five) minutes as needed for chest pain. Reported on 12/26/2015    . HYDROcodone-acetaminophen  (NORCO/VICODIN) 5-325 MG tablet Take 1 tablet by mouth every 4 (four) hours as needed. (Patient not taking: Reported on 12/26/2015) 40 tablet 0  . HYDROcodone-acetaminophen (NORCO/VICODIN) 5-325 MG tablet Take 1 tablet by mouth every 4 (four) hours as needed. (Patient not taking: Reported on 01/10/2016) 40 tablet 0   No facility-administered medications prior to visit.     Allergies:   Review of patient's allergies indicates no known allergies.   Past Medical History  Diagnosis Date  . Coronary artery disease   . Hyperlipidemia   . Hypertension   . Hypothyroidism 10/22/2015  . Osteoporosis 10/22/2015  . Diabetes mellitus ([place]) 10/22/2015  . Myocardial infarction [facility]) July 1995  . COPD (chronic obstructive pulmonary disease) ([place])   . Depression   . Arthritis   . Cancer ([place])     lung cancer  . Full dentures     full upper dentures, plate on bottom  . History of pneumonia   . History of bronchitis   . Stress incontinence     Past Surgical History  Procedure Laterality Date  . Coronary artery bypass graft      11/1994  .  Abdominal hysterectomy      1968  . 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 Bladder surgery      1972  . Tmj arthroplasty      1979  . Tonsillectomy    . Cardiac catheterization  1995, 1996    Dr. Wynonia Lawman saw here then here at [facility] both times; 'they did a balloon and opened it up' first time, the 2nd was scheduled d/t abnormal stress test  . Appendectomy      with hysterectomy  . Colonoscopy w/ polypectomy    . Multiple tooth extractions    . Video bronchoscopy with endobronchial navigation N/A 11/13/2015    Procedure: VIDEO BRONCHOSCOPY WITH ENDOBRONCHIAL NAVIGATION, with placement of fudicial markers;  Surgeon: Grace Isaac, MD;  Location: [facility] OR;  Service: Thoracic;  Laterality: N/A;  . Lung biopsy N/A 11/13/2015    Procedure: RIGHT LUNG BIOPSY;  Surgeon: Grace Isaac, MD;  Location: [facility] OR;  Service: Thoracic;   Laterality: N/A;  . Radioactive seed guided mastectomy with axillary sentinel lymph node biopsy Left 11/19/2015    Procedure: RADIOACTIVE SEED GUIDED PARTIAL MASTECTOMY WITH AXILLARY SENTINEL LYMPH NODE BIOPSY;  Surgeon: Donnie Mesa, MD;  Location: [place];  Service: General;  Laterality: Left;  . Re-excision of breast lumpectomy Left 12/03/2015    Procedure: RE-EXCISION INFERIOR MARGIN OF LEFT BREAST LUMPECTOMY;  Surgeon: Donnie Mesa, MD;  Location: [place] OR;  Service: General;  Laterality: Left;     Social History:  The patient  reports that she quit smoking about 21 years ago. She does not have any smokeless tobacco history on file. She reports that she does not drink alcohol or use illicit drugs.   Family History:  The patient's family history includes Alzheimer's disease in her mother; Heart attack in her father.    ROS:  Please see the history of present illness. All other systems are reviewed and  Negative to the above problem except as noted.    PHYSICAL EXAM: VS:  BP 140/82 mmHg  Pulse 75  Ht '5\' 2"'$  (1.575 m)  Wt 169 lb 3.2 oz (76.749 kg)  BMI 30.94 kg/m2  SpO2 96%  GEN: Well nourished, well developed, in no acute distress HEENT: normal Neck: no JVD, carotid bruits, or masses Cardiac: RRR; no murmurs, rubs, or gallops,no edema  Respiratory:  clear to auscultation bilaterally, normal work of breathing GI: soft, nontender, nondistended, + BS  No hepatomegaly  MS: no deformity Moving all extremities   Skin: warm and dry, no rash Neuro:  Strength and sensation are intact Psych: euthymic mood, full affect   EKG:  EKG is not ordered today.   Lipid Panel No results found for: CHOL, TRIG, HDL, CHOLHDL, VLDL, LDLCALC, LDLDIRECT    Wt Readings from Last 3 Encounters:  01/10/16 169 lb 3.2 oz (76.749 kg)  12/26/15 165 lb 8 oz (75.07 kg)  12/03/15 164 lb (74.39 kg)      ASSESSMENT AND PLAN:  1  CAD  No symptoms of angina  2.  HL  Continue lipitor  F/U  next winter      Signed, Dorris Carnes, MD  01/11/2016 6:54 AM    [address], [place], [place]  [address] Phone: [phone]; Fax: [phone]

## 2016-01-09 ENCOUNTER — Ambulatory Visit
Admission: RE | Admit: 2016-01-09 | Discharge: 2016-01-09 | Disposition: A | Discharge status: Home / Self Care | Payer: Medicare Other | Source: Ambulatory Visit | Attending: Radiation Oncology | Admitting: Radiation Oncology

## 2016-01-09 DIAGNOSIS — Z510 Encounter for antineoplastic radiation therapy: Secondary | ICD-10-CM | POA: Diagnosis not present

## 2016-01-10 ENCOUNTER — Ambulatory Visit
Admission: RE | Admit: 2016-01-10 | Discharge: 2016-01-10 | Disposition: A | Discharge status: Home / Self Care | Payer: Medicare Other | Source: Ambulatory Visit | Attending: Radiation Oncology | Admitting: Radiation Oncology

## 2016-01-10 ENCOUNTER — Ambulatory Visit (INDEPENDENT_AMBULATORY_CARE_PROVIDER_SITE_OTHER): Payer: Medicare Other | Admitting: Internal Medicine

## 2016-01-10 ENCOUNTER — Encounter: Payer: Self-pay | Admitting: Internal Medicine

## 2016-01-10 VITALS — BP 140/82 | HR 75 | Ht 62.0 in | Wt 169.2 lb

## 2016-01-10 DIAGNOSIS — I25709 Atherosclerosis of coronary artery bypass graft(s), unspecified, with unspecified angina pectoris: Secondary | ICD-10-CM

## 2016-01-10 DIAGNOSIS — E785 Hyperlipidemia, unspecified: Secondary | ICD-10-CM

## 2016-01-10 DIAGNOSIS — Z510 Encounter for antineoplastic radiation therapy: Secondary | ICD-10-CM | POA: Diagnosis not present

## 2016-01-14 ENCOUNTER — Encounter: Payer: Self-pay | Admitting: *Deleted

## 2016-01-14 ENCOUNTER — Ambulatory Visit
Admission: RE | Admit: 2016-01-14 | Discharge: 2016-01-14 | Disposition: A | Discharge status: Home / Self Care | Payer: Medicare Other | Source: Ambulatory Visit | Attending: Radiation Oncology | Admitting: Radiation Oncology

## 2016-01-14 ENCOUNTER — Encounter: Payer: Self-pay | Admitting: Radiation Oncology

## 2016-01-14 VITALS — BP 118/59 | HR 73 | Temp 98.0°F | Resp 18 | Ht 62.0 in | Wt 166.5 lb

## 2016-01-14 DIAGNOSIS — Z510 Encounter for antineoplastic radiation therapy: Secondary | ICD-10-CM | POA: Diagnosis not present

## 2016-01-14 DIAGNOSIS — C50412 Malignant neoplasm of upper-outer quadrant of left female breast: Secondary | ICD-10-CM

## 2016-01-14 DIAGNOSIS — C50912 Malignant neoplasm of unspecified site of left female breast: Secondary | ICD-10-CM | POA: Insufficient documentation

## 2016-01-14 DIAGNOSIS — R911 Solitary pulmonary nodule: Secondary | ICD-10-CM

## 2016-01-14 MED ORDER — RADIAPLEXRX EX GEL
Freq: Once | CUTANEOUS | Status: AC
  Administered 2016-01-14: 17:00:00 via TOPICAL

## 2016-01-14 MED ORDER — ALRA NON-METALLIC DEODORANT (RAD-ONC)
1.0000 | Freq: Once | TOPICAL | Status: AC
Start: 2016-01-14 — End: 2016-01-14
  Administered 2016-01-14: 1 via TOPICAL

## 2016-01-14 NOTE — Addendum Note (Signed)
Encounter addended by: Malena Edman, RN on: 01/14/2016  4:57 PM<BR>     Documentation filed: Inpatient Patient Education, Inpatient [facility]

## 2016-01-14 NOTE — Progress Notes (Signed)
[place] Psychosocial Distress Screening Clinical Social Work  Clinical Social Work was referred by distress screening protocol.  The patient scored a 7 on the Psychosocial Distress Thermometer which indicates moderate distress. Clinical Social Worker contacted patient at home to assess for distress and other psychosocial needs.  CSW left a message for patient offering support and information on the support team and support services at [facility].  CSW encouraged patient to call back with any questions or concerns.     ONCBCN DISTRESS SCREENING 12/26/2015  Screening Type Initial Screening  Distress experienced in past week (1-10) 7  Family Problem type   Emotional problem type Nervousness/Anxiety;Adjusting to illness  Referral to clinical social work     Johnnye Lana, MSW, [place], [facility] Clinical Social Worker [employer] [phone]

## 2016-01-14 NOTE — Progress Notes (Addendum)
Denise Mckenzie has received 4 fractions to her left breast and right lung.  Skin to left breast with slight color,tender breast and normal color to right chest.  Radiaplex and deodorant given today with education. Appetite is good.  Having fatigue in the evening starting last Friday.  Denies having any pain, SOB. Coughing nonproductive at times today. Wt Readings from Last 3 Encounters:  01/14/16 166 lb 8 oz (75.524 kg)  01/10/16 169 lb 3.2 oz (76.749 kg)  12/26/15 165 lb 8 oz (75.07 kg)   BP 118/59 mmHg  Pulse 73  Temp(Src) 98 F (36.7 C)  Resp 18  Ht '5\' 2"'$  (1.575 m)  Wt 166 lb 8 oz (75.524 kg)  BMI 30.45 kg/m2  SpO2 99%

## 2016-01-14 NOTE — Progress Notes (Signed)
Weekly Management Note Breast - Current Dose: 10.68 Gy      Projected Dose: 42.72 Gy Lung - Current Dose: 18 Gy  Projected Dose: 54 Gy   Narrative:  The patient presents for routine under treatment assessment.  CBCT/MVCT images/Port film x-rays were reviewed.  The chart was checked.  Denise Mckenzie has received 4 fractions to her left breast and 1 fraction to her right lung. She reports her left breast is tender. Radiaplex and deodorant given today with education by the nurse.  Physical Findings: No skin changes.  Vitals:  Filed Vitals:   01/14/16 1559  BP: 118/59  Pulse: 73  Temp: 98 F (36.7 C)  Resp: 18   Weight:  Wt Readings from Last 3 Encounters:  01/14/16 166 lb 8 oz (75.524 kg)  01/10/16 169 lb 3.2 oz (76.749 kg)  12/26/15 165 lb 8 oz (75.07 kg)   Lab Results  Component Value Date   WBC 11.0* 11/27/2015   HGB 14.8 11/27/2015   HCT 44.2 11/27/2015   MCV 91.1 11/27/2015   PLT 220 11/27/2015   Lab Results  Component Value Date   CREATININE 0.9 11/27/2015   BUN 10.7 11/27/2015   NA 139 11/27/2015   K 3.6 11/27/2015   CL 95* 11/11/2015   CO2 33* 11/27/2015     Impression:  The patient is tolerating radiation.  Plan:  Continue treatment as planned. Start radiaplex.  ------------------------------------------------  Thea Silversmith, MD  This document serves as a record of services personally performed by Thea Silversmith, MD. It was created on her behalf by Darcus Austin, a trained medical scribe. The creation of this record is based on the scribe's personal observations and the provider's statements to them. This document has been checked and approved by the attending provider.

## 2016-01-15 ENCOUNTER — Ambulatory Visit
Admission: RE | Admit: 2016-01-15 | Discharge: 2016-01-15 | Disposition: A | Discharge status: Home / Self Care | Payer: Medicare Other | Source: Ambulatory Visit | Attending: Radiation Oncology | Admitting: Radiation Oncology

## 2016-01-15 ENCOUNTER — Telehealth: Payer: Self-pay | Admitting: *Deleted

## 2016-01-15 DIAGNOSIS — Z510 Encounter for antineoplastic radiation therapy: Secondary | ICD-10-CM | POA: Diagnosis not present

## 2016-01-15 NOTE — Telephone Encounter (Signed)
Spoke with patient to follow up after start of radiation.  She states she is doing well.  Encouraged her to call with any needs or concerns.

## 2016-01-16 ENCOUNTER — Ambulatory Visit
Admission: RE | Admit: 2016-01-16 | Discharge: 2016-01-16 | Disposition: A | Discharge status: Home / Self Care | Payer: Medicare Other | Source: Ambulatory Visit | Attending: Radiation Oncology | Admitting: Radiation Oncology

## 2016-01-16 DIAGNOSIS — Z510 Encounter for antineoplastic radiation therapy: Secondary | ICD-10-CM | POA: Diagnosis not present

## 2016-01-17 ENCOUNTER — Ambulatory Visit
Admission: RE | Admit: 2016-01-17 | Discharge: 2016-01-17 | Disposition: A | Discharge status: Home / Self Care | Payer: Medicare Other | Source: Ambulatory Visit | Attending: Radiation Oncology | Admitting: Radiation Oncology

## 2016-01-17 DIAGNOSIS — Z510 Encounter for antineoplastic radiation therapy: Secondary | ICD-10-CM | POA: Diagnosis not present

## 2016-01-20 ENCOUNTER — Ambulatory Visit
Admission: RE | Admit: 2016-01-20 | Discharge: 2016-01-20 | Disposition: A | Discharge status: Home / Self Care | Payer: Medicare Other | Source: Ambulatory Visit | Attending: Radiation Oncology | Admitting: Radiation Oncology

## 2016-01-20 ENCOUNTER — Encounter: Payer: Self-pay | Admitting: Radiation Oncology

## 2016-01-20 VITALS — BP 123/75 | HR 68 | Temp 98.1°F | Resp 18 | Ht 62.0 in | Wt 166.7 lb

## 2016-01-20 DIAGNOSIS — Z510 Encounter for antineoplastic radiation therapy: Secondary | ICD-10-CM | POA: Diagnosis not present

## 2016-01-20 DIAGNOSIS — C50412 Malignant neoplasm of upper-outer quadrant of left female breast: Secondary | ICD-10-CM

## 2016-01-20 NOTE — Progress Notes (Signed)
Denise Mckenzie has received 8 fractions to her left breast and right Lung.  Skin to left breast and right chest with normal color.  Using Radiaplex glex bid.  Appetite is okay.  Having fatigue around 2 pm. daily.  Denies  Pain today. No c/o sore throat,SOB. Wt Readings from Last 3 Encounters:  01/20/16 166 lb 11.2 oz (75.615 kg)  01/14/16 166 lb 8 oz (75.524 kg)  01/10/16 169 lb 3.2 oz (76.749 kg)  BP 123/75 mmHg  Pulse 68  Temp(Src) 98.1 F (36.7 C)  Resp 18  Ht '5\' 2"'$  (1.575 m)  Wt 166 lb 11.2 oz (75.615 kg)  BMI 30.48 kg/m2  SpO2 100%

## 2016-01-20 NOTE — Progress Notes (Signed)
Weekly Management Note   ICD-9-CM ICD-10-CM   1. Breast cancer of upper-outer quadrant of left female breast (HCC) 174.4 C50.412     Left Breast - Current Dose: 21.36 Gy      Projected Dose: 42.72 Gy Rt Lung - Current Dose: 36 Gy  Projected Dose: 54 Gy   Narrative:  The patient presents for routine under treatment assessment.  CBCT/MVCT images/Port film x-rays were reviewed.  The chart was checked.  She is doing well, some fatigue   Physical Findings: left breast slight hyperpigmentation  Vitals:  Filed Vitals:   01/20/16 1033  BP: 123/75  Pulse: 68  Temp: 98.1 F (36.7 C)  Resp: 18   Weight:  Wt Readings from Last 3 Encounters:  01/20/16 166 lb 11.2 oz (75.615 kg)  01/14/16 166 lb 8 oz (75.524 kg)  01/10/16 169 lb 3.2 oz (76.749 kg)   Lab Results  Component Value Date   WBC 11.0* 11/27/2015   HGB 14.8 11/27/2015   HCT 44.2 11/27/2015   MCV 91.1 11/27/2015   PLT 220 11/27/2015   Lab Results  Component Value Date   CREATININE 0.9 11/27/2015   BUN 10.7 11/27/2015   NA 139 11/27/2015   K 3.6 11/27/2015   CL 95* 11/11/2015   CO2 33* 11/27/2015     Impression:  The patient is tolerating radiation.  Plan:  Continue treatment as planned. Continue  radiaplex.  -----------------------------------  Eppie Gibson, MD

## 2016-01-21 ENCOUNTER — Inpatient Hospital Stay: Admission: RE | Admit: 2016-01-21 | Payer: Self-pay | Source: Ambulatory Visit | Admitting: Radiation Oncology

## 2016-01-21 ENCOUNTER — Ambulatory Visit
Admission: RE | Admit: 2016-01-21 | Discharge: 2016-01-21 | Disposition: A | Discharge status: Home / Self Care | Payer: Medicare Other | Source: Ambulatory Visit | Attending: Radiation Oncology | Admitting: Radiation Oncology

## 2016-01-21 ENCOUNTER — Telehealth: Payer: Self-pay | Admitting: Oncology

## 2016-01-21 ENCOUNTER — Ambulatory Visit (HOSPITAL_BASED_OUTPATIENT_CLINIC_OR_DEPARTMENT_OTHER): Payer: Medicare Other | Admitting: Oncology

## 2016-01-21 VITALS — BP 118/72 | HR 69 | Temp 98.1°F | Resp 18 | Ht 62.0 in | Wt 164.6 lb

## 2016-01-21 DIAGNOSIS — G4700 Insomnia, unspecified: Secondary | ICD-10-CM

## 2016-01-21 DIAGNOSIS — R918 Other nonspecific abnormal finding of lung field: Secondary | ICD-10-CM

## 2016-01-21 DIAGNOSIS — M545 Low back pain: Secondary | ICD-10-CM

## 2016-01-21 DIAGNOSIS — C50412 Malignant neoplasm of upper-outer quadrant of left female breast: Secondary | ICD-10-CM

## 2016-01-21 DIAGNOSIS — Z510 Encounter for antineoplastic radiation therapy: Secondary | ICD-10-CM | POA: Diagnosis not present

## 2016-01-21 DIAGNOSIS — R05 Cough: Secondary | ICD-10-CM

## 2016-01-21 MED ORDER — ANASTROZOLE 1 MG PO TABS: 1.0000 mg | ORAL_TABLET | Freq: Every day | ORAL | Status: DC

## 2016-01-21 NOTE — Progress Notes (Signed)
[facility]  [phone] [phone]     ID: Denise Mckenzie DOB: 1946-05-14  MR#: [id number]  CVE#:[id number]  Patient Care Team: Lorene Dy, MD as PCP - General (Internal Medicine) Grace Isaac, MD as Consulting Physician (Cardiothoracic Surgery) Donnie Mesa, MD as Consulting Physician (General Surgery) Thea Silversmith, MD as Consulting Physician (Radiation Oncology) Chauncey Cruel, MD as Consulting Physician (Oncology) Thea Silversmith, MD as Consulting Physician (Radiation Oncology) Curt Bears, MD as Consulting Physician (Oncology) PCP: Myriam Jacobson, MD GYN: OTHER MD:  CHIEF COMPLAINT: Lung cancer, breast cancer  CURRENT TREATMENT: adjuvant radiaiton   BREAST AND LUNG CANCER PRESENTING HISTORY:  LUNG CANCER From the earlier summary report:  Denise Mckenzie developed some cough early January 2017 and brought it to her primary care physician's attention. A chest x-ray was obtained 09/01/2014. This showed a vague lingular density measuring 1.4 cm. Further evaluation with a CT scan of the chest obtained 09/11/2015 found a 1.1 cm right upper lobe nodule. The area in the lingular density was felt to be scarring. There were other scattered nodules too small to characterize. There was no adenopathy and no bony lesions.  Accordingly on 10/11/2015 the patient underwent PET scan. The right upper lobe lung lesion was hypermetabolic, with an SUV max of 24.8. It measured 1.4 cm on the PET scan. At least for small pulmonary nodules were also again noted, the largest measuring 4 mm, all below PET resolution. There were felt to be likely benign. Incidentally a hypermetabolic 1 cm soft tissue nodule in the left breast was noted, with an SUV max of 4.1. A right adrenal mass measuring 2.5 cm was not hypermetabolic, consistent with a benign adenoma. CT scan of the head with and without contrast 10/22/2015 was benign  The patient's case was presented at  the lung cancer conference. Dr. Servando Snare comments that the patient's diffusion capacity was 28% and the FEV 1 was 47% of predicted, limiting surgical choices. Accordingly on 11/13/2015 the patient underwent video bronchoscopy with endobronchial navigation and right lung biopsy with marker placement The pathology from this procedure ([id number]) showed only blood clot and scant mucosal tissue. It was not diagnostic that it appears to be clearly discordant given the earlier PET results. The patient completed definitive surgery for the lung lesion on 01/21/2016  BREAST CANCER From the initial intake note:  The incidentally noted left breast mass was further evaluated with bilateral diagnostic mammography with tomography and [facility]28[facility]2015. This found a breast density to be category be. In the upper-outer quadrant there was a 1.0 cm spiculated mass which was not palpable. Ultrasonography confirmed a spiculated hypoechoic mass at the 2:00 position of the left breast, 3 cm from the nipple, measuring 1.0 cm. In the left axilla there was a 1.2 cm lymph node with a prominent cortex.  Biopsy of the left breast mass in question 11/04/2015 showed ([id number]) invasive ductal carcinoma, grade 2, estrogen receptor 100% positive, progesterone receptor 100% positive, both with strong staining intensity, with an MIB-1 of 6%, and no HER-2 amplification, the signals ratio being 1.32 and the number per cell 3.10. The suspicious left axillary lymph node was biopsied at the same time and it showed no evidence of carcinoma. This was felt to be concordant.  On 11/19/2015 Izora Gala underwent left lumpectomy with sentinel lymph node sampling. The final pathology ([id number]) confirmed an invasive ductal carcinoma, grade 2, measuring 1.2 cm. Invasive disease was focally present at  the inferior margin. In situ disease was less than 0.1 cm to the inferior margin as well. All 4 sentinel lymph  nodes were clear. Repeat HER-2 was again negative with a signals ratio of 1.43, the number per cell being 3.50.  Her subsequent history is as detailed below.  INTERVAL HISTORY: Lake returns today for follow-up of her lung and breast cancers. She completes radiation to the lung lesions today. She has tolerated that remarkably well. She has a little bit of a dry cough, which is not worse than before.  She will complete her radiation to the breast next week. She is starting to feel fatigued from that. Her skin however is holding up well.  REVIEW OF SYSTEMS: Denise Mckenzie has a little bit of insomnia, and something is waking her up about 3 AM most days. It is not nearly the need to urinate and it is not hot flashes. She doesn't feel anxious. She continues on Elavil at bedtime. As stated she has a little bit of a dry cough, which is not worse than before. Her back pain has noted done quite well" considering that she has to "mind that flat cold slab" downstairs. Sugars are fine. A detailed review of systems today was otherwise stable  Past Medical History  Diagnosis Date  . Coronary artery disease   . Hyperlipidemia   . Hypertension   . Hypothyroidism 10/22/2015  . Osteoporosis 10/22/2015  . Diabetes mellitus ([facility]) 10/22/2015  . Myocardial infarction [facility]) July 1995  . COPD (chronic obstructive pulmonary disease) ([place])   . Depression   . Arthritis   . Cancer ([place])     lung cancer  . Full dentures     full upper dentures, plate on bottom  . History of pneumonia   . History of bronchitis   . Stress incontinence     PAST SURGICAL HISTORY: Past Surgical History  Procedure Laterality Date  . Coronary artery bypass graft      11/1994  . Abdominal hysterectomy      1968  . 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 Bladder surgery      1972  . Tmj arthroplasty      1979  . Tonsillectomy    . Cardiac catheterization  1995, 1996    Dr. Wynonia Lawman saw here then here at  [facility] both times; 'they did a balloon and opened it up' first time, the 2nd was scheduled d/t abnormal stress test  . Appendectomy      with hysterectomy  . Colonoscopy w/ polypectomy    . Multiple tooth extractions    . Video bronchoscopy with endobronchial navigation N/A 11/13/2015    Procedure: VIDEO BRONCHOSCOPY WITH ENDOBRONCHIAL NAVIGATION, with placement of fudicial markers;  Surgeon: Grace Isaac, MD;  Location: [facility] OR;  Service: Thoracic;  Laterality: N/A;  . Lung biopsy N/A 11/13/2015    Procedure: RIGHT LUNG BIOPSY;  Surgeon: Grace Isaac, MD;  Location[facility] OR;  Service: Thoracic;  Laterality: N/A;  . Radioactive seed guided mastectomy with axillary sentinel lymph node biopsy Left 11/19/2015    Procedure: RADIOACTIVE SEED GUIDED PARTIAL MASTECTOMY WITH AXILLARY SENTINEL LYMPH NODE BIOPSY;  Surgeon: Donnie Mesa, MD;  Location: [facility];  Service: General;  Laterality: Left;  . Re-excision of breast lumpectomy Left 12/03/2015    Procedure: RE-EXCISION INFERIOR MARGIN OF LEFT BREAST LUMPECTOMY;  Surgeon: Donnie Mesa, MD;  Location: [facility];  Service: General;  Laterality: Left;    FAMILY HISTORY Family History  Problem Relation Age of Onset  . Alzheimer's disease Mother   . Heart attack Father   The patient's father died from a heart attack at age 59. The patient's mother died with Alzheimer's disease at age 39. Nishat has one brother and one sister. The only cancer in the family was breast cancer in a maternal aunt diagnosed in her early 19s.  GYNECOLOGIC HISTORY:  No LMP recorded. Patient has had a hysterectomy. Menarche age 21, first live birth age 65. The patient is GX P2. She underwent total abdominal hysterectomy with bilateral salpingo-oophorectomy 1968. She did not take hormone replacement.  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 Marya Amsler lives in [place]. The patient's daughter Sherri Rad lives in [place] and works in Programmer, applications. The patient has 3 grandchildren and 2 great-grandchildren. Her youngest grandchild graduated from high school June 2017. She attends a [employer] in [place]: In place   HEALTH MAINTENANCE: Social History  Substance Use Topics  . Smoking status: Former Smoker    Quit date: 08/17/1994  . Smokeless tobacco: Not on file  . Alcohol Use: No     Colonoscopy:2010/[facility]  PAP: 2015  Bone density: 2016/[facility]  Lipid panel:  No Known Allergies  Current Outpatient Prescriptions  Medication Sig Dispense Refill  . acetaminophen (TYLENOL) 500 MG tablet Take 1,000 mg by mouth 2 (two) times daily as needed (pain). Reported on 01/20/2016    . amitriptyline (ELAVIL) 50 MG tablet Take 50 mg by mouth at bedtime.      Marland Kitchen aspirin EC 325 MG tablet Take 325 mg by mouth daily.    Marland Kitchen atorvastatin (LIPITOR) 40 MG tablet Take 40 mg by mouth at bedtime.     . Calcium Citrate-Vitamin D (CALCIUM + D PO) Take 2 tablets by mouth 3 (three) times daily. Each tablet Calcium 300 mg, Vitamin D3 250 I.U.    . ibandronate (BONIVA) 150 MG tablet Take 150 mg by mouth every 30 (thirty) days. On the 4th Monday of each month  4  . Krill Oil 1000 MG CAPS Take 1,000 mg by mouth daily.    Marland Kitchen levothyroxine (SYNTHROID, LEVOTHROID) 88 MCG tablet Take 88 mcg by mouth daily before breakfast.     . losartan-hydrochlorothiazide (HYZAAR) 100-12.5 MG per tablet Take 1 tablet by mouth daily.      . metFORMIN (GLUCOPHAGE) 500 MG tablet Take 500 mg by mouth daily.  4  . metoprolol (LOPRESSOR) 50 MG tablet Take 25 mg by mouth 2 (two) times daily. Reported on 01/20/2016    . nitroGLYCERIN (NITROSTAT) 0.4 MG SL tablet Place 0.4 mg under the tongue every 5 (five) minutes as needed for chest pain. Reported on 01/20/2016     No current facility-administered medications for this visit.     OBJECTIVE: Middle-aged white woman Who appears stated age  60 Vitals:   01/21/16 1055  BP: 118/72  Pulse: 69  Temp: 98.1 F (36.7 C)  Resp: 18     Body mass index is 30.1 kg/(m^2).    ECOG FS:1 - Symptomatic but completely ambulatory  Sclerae unicteric, pupils round and equal Oropharynx clear and moist-- full upper plate No cervical or supraclavicular adenopathy Lungs no rales or rhonchi, no wheezes appreciated Heart regular rate and rhythm Abd soft,  nontender, positive bowel sounds MSK no focal spinal tenderness, no upper extremity lymphedema Neuro: nonfocal, well oriented, appropriate affect Breasts: Right breast is unremarkable. Left breast is currently undergoing radiation. There is minimal erythema. There is no evidence of local recurrence. The cosmetic result is excellent. The left axilla is benign.    LAB RESULTS:  CMP     Component Value Date/Time   NA 139 11/27/2015 1547   NA 133* 11/11/2015 1535   K 3.6 11/27/2015 1547   K 3.8 11/11/2015 1535   CL 95* 11/11/2015 1535   CO2 33* 11/27/2015 1547   CO2 26 11/11/2015 1535   GLUCOSE 88 11/27/2015 1547   GLUCOSE 95 11/11/2015 1535   BUN 10.7 11/27/2015 1547   BUN 8 11/11/2015 1535   CREATININE 0.9 11/27/2015 1547   CREATININE 0.77 11/11/2015 1535   CALCIUM 9.7 11/27/2015 1547   CALCIUM 9.8 11/11/2015 1535   PROT 7.3 11/27/2015 1547   PROT 7.5 11/11/2015 1535   ALBUMIN 3.6 11/27/2015 1547   ALBUMIN 4.2 11/11/2015 1535   AST 18 11/27/2015 1547   AST 19 11/11/2015 1535   ALT 16 11/27/2015 1547   ALT 19 11/11/2015 1535   ALKPHOS 90 11/27/2015 1547   ALKPHOS 98 11/11/2015 1535   BILITOT 0.39 11/27/2015 1547   BILITOT 0.4 11/11/2015 1535   GFRNONAA >60 11/11/2015 1535   GFRAA >60 11/11/2015 1535    INo results found for: SPEP, UPEP  Lab Results  Component Value Date   WBC 11.0* 11/27/2015   NEUTROABS 7.5* 11/27/2015   HGB 14.8 11/27/2015   HCT 44.2 11/27/2015   MCV 91.1 11/27/2015   PLT 220  11/27/2015      Chemistry      Component Value Date/Time   NA 139 11/27/2015 1547   NA 133* 11/11/2015 1535   K 3.6 11/27/2015 1547   K 3.8 11/11/2015 1535   CL 95* 11/11/2015 1535   CO2 33* 11/27/2015 1547   CO2 26 11/11/2015 1535   BUN 10.7 11/27/2015 1547   BUN 8 11/11/2015 1535   CREATININE 0.9 11/27/2015 1547   CREATININE 0.77 11/11/2015 1535      Component Value Date/Time   CALCIUM 9.7 11/27/2015 1547   CALCIUM 9.8 11/11/2015 1535   ALKPHOS 90 11/27/2015 1547   ALKPHOS 98 11/11/2015 1535   AST 18 11/27/2015 1547   AST 19 11/11/2015 1535   ALT 16 11/27/2015 1547   ALT 19 11/11/2015 1535   BILITOT 0.39 11/27/2015 1547   BILITOT 0.4 11/11/2015 1535       No results found for: LABCA2  No components found for: LABCA125  No results for input(s): INR in the last 168 hours.  Urinalysis No results found for: COLORURINE, APPEARANCEUR, LABSPEC, PHURINE, GLUCOSEU, HGBUR, BILIRUBINUR, KETONESUR, PROTEINUR, UROBILINOGEN, NITRITE, LEUKOCYTESUR    ELIGIBLE FOR AVAILABLE RESEARCH PROTOCOL: No  STUDIES: No results found.  ASSESSMENT: 70 y.o. [place] woman  (1) hypermetabolic right upper lobe lung mass noted on chest CT scan 09/11/2015, clinically T1a N0  (a) biopsy by video bronchoscopy 11/13/2015 nondiagnostic  (b) definitive stereotactic radiosurgery Completed 01/21/2016  (2) biopsy of a left upper outer quadrant breast mass 10/28/2015 showed a clinical T1c N0, stage IA invasive ductal carcinoma, grade 2, estrogen and progesterone strongly positive, HER-2 negative, with an MIB-1 of 6%.  (3) status post left lumpectomy and sentinel lymph node sampling 11/19/2015 for apT1c pN0, stage IA invasive ductal carcinoma, grade 2, repeat HER-2 again negative, with a focally positive inferior margin  (  a) additional surgery for margin clearance  12/03/2015   (4) Adjuvant radiation to be completed 01/30/2016  (5) to start anastrozole 02/18/2016   PLAN: Lynnsey will complete  all her radiation treatments next week. I think it would be prudent to give her a couple of weeks before starting anastrozole so her first day on that drug will 02/18/2016.  Today we again reviewed the possible toxicities, side effects and complications of that agent, which however most of my patients tolerate quite well. We also talked about cost issues and she will let me know if the cough seems to high.  Otherwise she will see me again in September. Likely we will obtain some restaging studies regarding her lung cancer around that time  Izora Gala for any problems that may develop before her next visit here. Chauncey Cruel, MD   01/21/2016 10:57 AM Medical Oncology and Hematology [facility] [address] [place], [place] [address] Tel. [phone]    Fax. [phone]

## 2016-01-21 NOTE — Telephone Encounter (Signed)
appt made and avs printed °

## 2016-01-22 ENCOUNTER — Ambulatory Visit
Admission: RE | Admit: 2016-01-22 | Discharge: 2016-01-22 | Disposition: A | Discharge status: Home / Self Care | Payer: Medicare Other | Source: Ambulatory Visit | Attending: Radiation Oncology | Admitting: Radiation Oncology

## 2016-01-22 ENCOUNTER — Inpatient Hospital Stay: Admission: RE | Admit: 2016-01-22 | Payer: Self-pay | Source: Ambulatory Visit | Admitting: Radiation Oncology

## 2016-01-22 DIAGNOSIS — Z510 Encounter for antineoplastic radiation therapy: Secondary | ICD-10-CM | POA: Diagnosis not present

## 2016-01-23 ENCOUNTER — Ambulatory Visit
Admission: RE | Admit: 2016-01-23 | Discharge: 2016-01-23 | Disposition: A | Discharge status: Home / Self Care | Payer: Medicare Other | Source: Ambulatory Visit | Attending: Radiation Oncology | Admitting: Radiation Oncology

## 2016-01-23 DIAGNOSIS — Z510 Encounter for antineoplastic radiation therapy: Secondary | ICD-10-CM | POA: Diagnosis not present

## 2016-01-24 ENCOUNTER — Ambulatory Visit
Admission: RE | Admit: 2016-01-24 | Discharge: 2016-01-24 | Disposition: A | Discharge status: Home / Self Care | Payer: Medicare Other | Source: Ambulatory Visit | Attending: Radiation Oncology | Admitting: Radiation Oncology

## 2016-01-24 DIAGNOSIS — Z510 Encounter for antineoplastic radiation therapy: Secondary | ICD-10-CM | POA: Diagnosis not present

## 2016-01-27 ENCOUNTER — Ambulatory Visit
Admission: RE | Admit: 2016-01-27 | Discharge: 2016-01-27 | Disposition: A | Discharge status: Home / Self Care | Payer: Medicare Other | Source: Ambulatory Visit | Attending: Radiation Oncology | Admitting: Radiation Oncology

## 2016-01-27 DIAGNOSIS — Z510 Encounter for antineoplastic radiation therapy: Secondary | ICD-10-CM | POA: Diagnosis not present

## 2016-01-28 ENCOUNTER — Ambulatory Visit
Admission: RE | Admit: 2016-01-28 | Discharge: 2016-01-28 | Disposition: A | Discharge status: Home / Self Care | Payer: Medicare Other | Source: Ambulatory Visit | Attending: Radiation Oncology | Admitting: Radiation Oncology

## 2016-01-28 ENCOUNTER — Encounter: Payer: Self-pay | Admitting: Radiation Oncology

## 2016-01-28 VITALS — BP 107/61 | HR 68 | Temp 98.5°F | Resp 18 | Ht 62.0 in | Wt 167.1 lb

## 2016-01-28 DIAGNOSIS — Z510 Encounter for antineoplastic radiation therapy: Secondary | ICD-10-CM | POA: Diagnosis not present

## 2016-01-28 DIAGNOSIS — C50412 Malignant neoplasm of upper-outer quadrant of left female breast: Secondary | ICD-10-CM

## 2016-01-28 NOTE — Progress Notes (Signed)
Denise Mckenzie has received 14 fractions to her left breast and right lung.  Skin to left breast with redness and normal skin color to right chest field.  Using Radiaplex gel bid.  Appetite is good.  Having fatigue around noon.  Denies pain,SOB and throat problems is having a nonproductive cough. EOT done Wt Readings from Last 3 Encounters:  01/28/16 167 lb 1.6 oz (75.796 kg)  01/21/16 164 lb 9.6 oz (74.662 kg)  01/20/16 166 lb 11.2 oz (75.615 kg)  BP 107/61 mmHg  Pulse 68  Temp(Src) 98.5 F (36.9 C) (Oral)  Resp 18  Ht '5\' 2"'$  (1.575 m)  Wt 167 lb 1.6 oz (75.796 kg)  BMI 30.56 kg/m2  SpO2 96%

## 2016-01-28 NOTE — Progress Notes (Signed)
Weekly Management Note Current Dose:  37.38 Gy  Projected Dose: 42.72 Gy   Narrative:  The patient presents for routine under treatment assessment.  CBCT/MVCT images/Port film x-rays were reviewed.  The chart was checked. She is doing well. She has appointment with Dr. Jana Hakim in September and will start AI in July. She has minimal skin pain.   Physical Findings: Weight: 167 lb 1.6 oz (75.796 kg). Slight left breast hyperpigmentation.   Impression:  The patient is tolerating radiation.  Plan:  Continue treatment as planned. Discussed follow up plan. Will order chest CT in July at follow up and plan on scans every 6 months after that.

## 2016-01-29 ENCOUNTER — Ambulatory Visit
Admission: RE | Admit: 2016-01-29 | Discharge: 2016-01-29 | Disposition: A | Discharge status: Home / Self Care | Payer: Medicare Other | Source: Ambulatory Visit | Attending: Radiation Oncology | Admitting: Radiation Oncology

## 2016-01-29 DIAGNOSIS — Z510 Encounter for antineoplastic radiation therapy: Secondary | ICD-10-CM | POA: Diagnosis not present

## 2016-01-30 ENCOUNTER — Ambulatory Visit
Admission: RE | Admit: 2016-01-30 | Discharge: 2016-01-30 | Disposition: A | Discharge status: Home / Self Care | Payer: Medicare Other | Source: Ambulatory Visit | Attending: Radiation Oncology | Admitting: Radiation Oncology

## 2016-01-30 ENCOUNTER — Encounter: Payer: Self-pay | Admitting: Radiation Oncology

## 2016-01-30 DIAGNOSIS — Z510 Encounter for antineoplastic radiation therapy: Secondary | ICD-10-CM | POA: Diagnosis not present

## 2016-01-31 ENCOUNTER — Telehealth: Payer: Self-pay | Admitting: *Deleted

## 2016-01-31 NOTE — Telephone Encounter (Signed)
  Oncology Nurse Navigator Documentation    Navigator Encounter Type: Telephone (Relate doing well and without complaints.) (01/31/16 1400) Telephone: Denise Mckenzie Call (Denies needs or questions) (01/31/16 1400)         Patient Visit Type: CJARWP (01/31/16 1400) Treatment Phase: Final Radiation Tx (01/31/16 1400)     Interventions: Referrals (01/31/16 1400) Referrals: Survivorship (01/31/16 1400)                    Time Spent with Patient: 15 (01/31/16 1400)

## 2016-02-20 NOTE — Progress Notes (Signed)
°  Radiation Oncology         [phone] ________________________________  Name: Denise Mckenzie MRN: [id number]  Date: 01/30/2016  DOB: June 30, 1946  End of Treatment Note  Diagnosis: T1cN0 invasive ductal carcinoma of the left breast with concurrent stage I likely non-small cell lung cancer of the right upper lobe  Indication for treatment: Curative  Radiation treatment dates: 01/08/16 - 01/30/16: Left Breast     01/14/16, 01/17/16, 01/21/16: Right Upper Lung  Site/dose: Left Breast: 42.720 Gy in 16 fractions         Right Upper Lung: 54 Gy in 3 fractions  Beams/energy: Left Breast: 3D // 10X, 15X Photon      Right Upper Lung: SBRT/SRT-3D // 6FFF Photon  Narrative: The patient tolerated radiation treatment relatively well.  Plan: The patient has completed radiation treatment. The patient will return to radiation oncology clinic for routine followup in one month. I advised them to call or return sooner if they have any questions or concerns related to their recovery or treatment.  ------------------------------------------------  Jodelle Gross, MD, PhD  This document serves as a record of services personally performed by Kyung Rudd, MD. It was created on his behalf by Darcus Austin, a trained medical scribe. The creation of this record is based on the scribe's personal observations and the provider's statements to them. This document has been checked and approved by the attending provider.

## 2016-03-06 ENCOUNTER — Other Ambulatory Visit: Payer: Self-pay | Admitting: *Deleted

## 2016-03-06 DIAGNOSIS — C50412 Malignant neoplasm of upper-outer quadrant of left female breast: Secondary | ICD-10-CM

## 2016-03-09 ENCOUNTER — Telehealth: Payer: Self-pay | Admitting: Oncology

## 2016-03-09 NOTE — Telephone Encounter (Signed)
Called patient to confirm appointment. Left message with husband. Appointment schedule and letter mailed. Merleen Nicely.

## 2016-03-12 ENCOUNTER — Ambulatory Visit: Payer: Self-pay | Admitting: Radiation Oncology

## 2016-04-02 ENCOUNTER — Ambulatory Visit
Admission: RE | Admit: 2016-04-02 | Discharge: 2016-04-02 | Disposition: A | Discharge status: Home / Self Care | Payer: Medicare Other | Source: Ambulatory Visit | Attending: Radiation Oncology | Admitting: Radiation Oncology

## 2016-04-02 ENCOUNTER — Encounter: Payer: Self-pay | Admitting: Radiation Oncology

## 2016-04-02 VITALS — BP 125/73 | HR 66 | Resp 16 | Wt 162.4 lb

## 2016-04-02 DIAGNOSIS — C50412 Malignant neoplasm of upper-outer quadrant of left female breast: Secondary | ICD-10-CM | POA: Insufficient documentation

## 2016-04-02 DIAGNOSIS — C3411 Malignant neoplasm of upper lobe, right bronchus or lung: Secondary | ICD-10-CM

## 2016-04-02 DIAGNOSIS — Y842 Radiological procedure and radiotherapy as the cause of abnormal reaction of the patient, or of later complication, without mention of misadventure at the time of the procedure: Secondary | ICD-10-CM | POA: Diagnosis not present

## 2016-04-02 NOTE — Progress Notes (Signed)
Radiation Oncology         [phone] ________________________________  Name: Denise Mckenzie MRN: [id number]  Date: 04/02/2016  DOB: 11-Mar-1946    Follow-Up Visit Note  CC: ROBERTS, Sharol Given, MD  Donnie Mesa, MD  Diagnosis:   T1cN0 invasive ductal carcinoma of the left breast with concurrent stage I likely non-small cell lung cancer of the right upper lobe    ICD-9-CM ICD-10-CM   1. Primary cancer of right upper lobe of lung (HCC) 162.3 C34.11 CT Chest Wo Contrast  2. Breast cancer of upper-outer quadrant of left female breast ([place]) 174.4 C50.412     Interval Since Last Radiation:  2 months ; 01/08/16 - 01/30/16: Left Breast                                                                                         01/14/16, 01/17/16, 01/21/16: Right Upper Lung  Narrative:  The patient returns today for routine follow-up. Weight and vitals stable. Reports faint hyperpigmentation without desquamation of left/treated breast. Reports using Dove lotion on treated skin. Denies any hot flashes. Reports taking anastrazole since July 4 without complication. No evidence of lymphedema noted. Denies palpating any breast abnormalities or seeing any evidence of nipple discharge. Reports an occasional sharp shooting pain in her left breast but, understands this related to healing from breast surgery. She isn't scheduled for a mammogram. Patient reports seeing her surgeon on Friday of last week. Reports fatigue continues, currently taking thyroid medication.                               ALLERGIES:  has No Known Allergies.  Meds: Current Outpatient Prescriptions  Medication Sig Dispense Refill  . acetaminophen (TYLENOL) 500 MG tablet Take 1,000 mg by mouth 2 (two) times daily as needed (pain). Reported on 01/20/2016    . amitriptyline (ELAVIL) 50 MG tablet Take 50 mg by mouth at bedtime.      Marland Kitchen anastrozole (ARIMIDEX) 1 MG tablet Take 1 tablet (1 mg total) by mouth daily. 90 tablet 4  . aspirin EC 325 MG  tablet Take 325 mg by mouth daily.    Marland Kitchen atorvastatin (LIPITOR) 40 MG tablet Take 40 mg by mouth at bedtime.     . Calcium Citrate-Vitamin D (CALCIUM + D PO) Take 2 tablets by mouth 3 (three) times daily. Each tablet Calcium 300 mg, Vitamin D3 250 I.U.    . ibandronate (BONIVA) 150 MG tablet Take 150 mg by mouth every 30 (thirty) days. On the 4th Monday of each month  4  . Krill Oil 1000 MG CAPS Take 1,000 mg by mouth daily.    Marland Kitchen levothyroxine (SYNTHROID, LEVOTHROID) 88 MCG tablet Take 88 mcg by mouth daily before breakfast.     . losartan-hydrochlorothiazide (HYZAAR) 100-12.5 MG per tablet Take 1 tablet by mouth daily.      . metFORMIN (GLUCOPHAGE) 500 MG tablet Take 500 mg by mouth daily.  4  . metoprolol (LOPRESSOR) 50 MG tablet Take 25 mg by mouth 2 (two) times daily. Reported on 01/28/2016    .  nitroGLYCERIN (NITROSTAT) 0.4 MG SL tablet Place 0.4 mg under the tongue every 5 (five) minutes as needed for chest pain. Reported on 01/28/2016     No current facility-administered medications for this encounter.     Physical Findings: The patient is in no acute distress. Patient is alert and oriented.  weight is 162 lb 6.4 oz (73.7 kg). Her blood pressure is 125/73 and her pulse is 66. Her respiration is 16 and oxygen saturation is 100%. .  Hyperpigmentation present in the treatment field. No residual desquamation.  Lab Findings: Lab Results  Component Value Date   WBC 11.0 (H) 11/27/2015   WBC 12.6 (H) 11/11/2015   HGB 14.8 11/27/2015   HCT 44.2 11/27/2015   PLT 220 11/27/2015    Lab Results  Component Value Date   NA 139 11/27/2015   K 3.6 11/27/2015   CHLORIDE 99 11/27/2015   CO2 33 (H) 11/27/2015   GLUCOSE 88 11/27/2015   BUN 10.7 11/27/2015   CREATININE 0.9 11/27/2015   BILITOT 0.39 11/27/2015   ALKPHOS 90 11/27/2015   AST 18 11/27/2015   ALT 16 11/27/2015   PROT 7.3 11/27/2015   ALBUMIN 3.6 11/27/2015   CALCIUM 9.7 11/27/2015   ANIONGAP 7 11/27/2015   ANIONGAP 12  11/11/2015    Radiographic Findings: No results found.  Impression:  The patient is recovering from the effects of radiation.    Plan:  I have ordered a CT Chest without contrast in 2 months. She is scheduled to follow up with medical oncology on 04/29/16 and survivorship on 05/20/16. Encouraged the patient to follow with her PCP to continue to monitor thyroid function and continued fatigue.   _____________________________________  Sheral Apley. Tammi Klippel, M.D.  This document serves as a record of services personally performed by Tyler Pita, MD. It was created on his behalf by Arlyce Harman, a trained medical scribe. The creation of this record is based on the scribe's personal observations and the provider's statements to them. This document has been checked and approved by the attending provider.

## 2016-04-02 NOTE — Addendum Note (Signed)
Encounter addended by: Heywood Footman, RN on: 04/02/2016  3:28 PM<BR>    Actions taken: Charge Capture section accepted

## 2016-04-02 NOTE — Progress Notes (Addendum)
Patient returns for a one month follow up with Dr. Tammi Klippel. Weight and vitals stable. Denies pain. Reports faint hyperpigmentation without desquamation of left/treated breast. Reports using Dove lotion on treated skin. Denies any hot flashes. Reports taking anastrazole since July 4 without complication. No evidence of lymphedema noted. Denies palpating any breast abnormalities or seeing any evidence of nipple discharge. Reports an occasional sharp shooting pain in her left breast but, understands this related to healing from breast surgery. She isn't scheduled for a mammogram. She is scheduled to see Dr. Jana Hakim in Sept. Patient reports seeing her surgeon on Friday of last week. Reports fatigue continues.   BP 125/73   Pulse 66   Resp 16   Wt 162 lb 6.4 oz (73.7 kg)   SpO2 100%   BMI 29.70 kg/m  Wt Readings from Last 3 Encounters:  04/02/16 162 lb 6.4 oz (73.7 kg)  01/28/16 167 lb 1.6 oz (75.8 kg)  01/21/16 164 lb 9.6 oz (74.7 kg)

## 2016-04-28 ENCOUNTER — Other Ambulatory Visit: Payer: Self-pay

## 2016-04-28 DIAGNOSIS — C50412 Malignant neoplasm of upper-outer quadrant of left female breast: Secondary | ICD-10-CM

## 2016-04-29 ENCOUNTER — Ambulatory Visit (HOSPITAL_BASED_OUTPATIENT_CLINIC_OR_DEPARTMENT_OTHER): Payer: Medicare Other | Admitting: Oncology

## 2016-04-29 ENCOUNTER — Telehealth: Payer: Self-pay

## 2016-04-29 ENCOUNTER — Other Ambulatory Visit (HOSPITAL_BASED_OUTPATIENT_CLINIC_OR_DEPARTMENT_OTHER): Payer: Medicare Other

## 2016-04-29 VITALS — BP 132/67 | HR 66 | Temp 98.3°F | Resp 18 | Ht 62.0 in | Wt 164.4 lb

## 2016-04-29 DIAGNOSIS — Z79811 Long term (current) use of aromatase inhibitors: Secondary | ICD-10-CM | POA: Diagnosis not present

## 2016-04-29 DIAGNOSIS — Z170 Estrogen receptor positive status [ER+]: Secondary | ICD-10-CM

## 2016-04-29 DIAGNOSIS — C50412 Malignant neoplasm of upper-outer quadrant of left female breast: Secondary | ICD-10-CM

## 2016-04-29 DIAGNOSIS — C3411 Malignant neoplasm of upper lobe, right bronchus or lung: Secondary | ICD-10-CM

## 2016-04-29 LAB — CBC WITH DIFFERENTIAL/PLATELET
BASO%: 1.1 % (ref 0.0–2.0)
BASOS ABS: 0.1 10*3/uL (ref 0.0–0.1)
EOS ABS: 0.3 10*3/uL (ref 0.0–0.5)
EOS%: 3.2 % (ref 0.0–7.0)
HCT: 44.4 % (ref 34.8–46.6)
HGB: 14.8 g/dL (ref 11.6–15.9)
LYMPH%: 13.8 % — AB (ref 14.0–49.7)
MCH: 30.1 pg (ref 25.1–34.0)
MCHC: 33.3 g/dL (ref 31.5–36.0)
MCV: 90.2 fL (ref 79.5–101.0)
MONO#: 0.8 10*3/uL (ref 0.1–0.9)
MONO%: 8.7 % (ref 0.0–14.0)
NEUT%: 73.2 % (ref 38.4–76.8)
NEUTROS ABS: 7 10*3/uL — AB (ref 1.5–6.5)
PLATELETS: 203 10*3/uL (ref 145–400)
RBC: 4.92 10*6/uL (ref 3.70–5.45)
RDW: 13.7 % (ref 11.2–14.5)
WBC: 9.6 10*3/uL (ref 3.9–10.3)
lymph#: 1.3 10*3/uL (ref 0.9–3.3)

## 2016-04-29 LAB — COMPREHENSIVE METABOLIC PANEL
ALT: 16 U/L (ref 0–55)
ANION GAP: 8 meq/L (ref 3–11)
AST: 16 U/L (ref 5–34)
Albumin: 3.4 g/dL — ABNORMAL LOW (ref 3.5–5.0)
Alkaline Phosphatase: 137 U/L (ref 40–150)
BILIRUBIN TOTAL: 0.59 mg/dL (ref 0.20–1.20)
BUN: 10.2 mg/dL (ref 7.0–26.0)
CHLORIDE: 94 meq/L — AB (ref 98–109)
CO2: 30 meq/L — AB (ref 22–29)
Calcium: 9.7 mg/dL (ref 8.4–10.4)
Creatinine: 0.8 mg/dL (ref 0.6–1.1)
EGFR: 70 mL/min/{1.73_m2} — AB (ref >90)
GLUCOSE: 89 mg/dL (ref 70–140)
POTASSIUM: 4.5 meq/L (ref 3.5–5.1)
SODIUM: 133 meq/L — AB (ref 136–145)
TOTAL PROTEIN: 7.3 g/dL (ref 6.4–8.3)

## 2016-04-29 NOTE — Progress Notes (Signed)
[facility]  [phone] Fax:([phone]) [phone]     ID: LEATHER ESTIS DOB: 1946/07/12  MR#: [id number]  VZD#:[id number]  Patient Care Team: Lorene Dy, MD as PCP - General (Internal Medicine) Grace Isaac, MD as Consulting Physician (Cardiothoracic Surgery) Donnie Mesa, MD as Consulting Physician (General Surgery) Thea Silversmith, MD as Consulting Physician (Radiation Oncology) Chauncey Cruel, MD as Consulting Physician (Oncology) Thea Silversmith, MD as Consulting Physician (Radiation Oncology) Curt Bears, MD as Consulting Physician (Oncology) PCP: Myriam Jacobson, MD GYN: OTHER MD:  CHIEF COMPLAINT: Lung cancer, breast cancer  CURRENT TREATMENT: Anastrozole   BREAST AND LUNG CANCER PRESENTING HISTORY:  LUNG CANCER From the earlier summary report:  Denise Mckenzie developed some cough early January 2017 and brought it to her primary care physician's attention. A chest x-ray was obtained 09/01/2014. This showed a vague lingular density measuring 1.4 cm. Further evaluation with a CT scan of the chest obtained 09/11/2015 found a 1.1 cm right upper lobe nodule. The area in the lingular density was felt to be scarring. There were other scattered nodules too small to characterize. There was no adenopathy and no bony lesions.  Accordingly on 10/11/2015 the patient underwent PET scan. The right upper lobe lung lesion was hypermetabolic, with an SUV max of 24.8. It measured 1.4 cm on the PET scan. At least for small pulmonary nodules were also again noted, the largest measuring 4 mm, all below PET resolution. There were felt to be likely benign. Incidentally a hypermetabolic 1 cm soft tissue nodule in the left breast was noted, with an SUV max of 4.1. A right adrenal mass measuring 2.5 cm was not hypermetabolic, consistent with a benign adenoma. CT scan of the head with and without contrast 10/22/2015 was benign  The patient's case was presented at the lung  cancer conference. Dr. Servando Snare comments that the patient's diffusion capacity was 28% and the FEV 1 was 47% of predicted, limiting surgical choices. Accordingly on 11/13/2015 the patient underwent video bronchoscopy with endobronchial navigation and right lung biopsy with marker placement The pathology from this procedure ([id number]) showed only blood clot and scant mucosal tissue. It was not diagnostic that it appears to be clearly discordant given the earlier PET results. The patient completed definitive surgery for the lung lesion on 01/21/2016  BREAST CANCER From the initial intake note:  The incidentally noted left breast mass was further evaluated with bilateral diagnostic mammography with tomography and [facility]28[facility]. This found a breast density to be category be. In the upper-outer quadrant there was a 1.0 cm spiculated mass which was not palpable. Ultrasonography confirmed a spiculated hypoechoic mass at the 2:00 position of the left breast, 3 cm from the nipple, measuring 1.0 cm. In the left axilla there was a 1.2 cm lymph node with a prominent cortex.  Biopsy of the left breast mass in question 11/04/2015 showed ([id number]) invasive ductal carcinoma, grade 2, estrogen receptor 100% positive, progesterone receptor 100% positive, both with strong staining intensity, with an MIB-1 of 6%, and no HER-2 amplification, the signals ratio being 1.32 and the number per cell 3.10. The suspicious left axillary lymph node was biopsied at the same time and it showed no evidence of carcinoma. This was felt to be concordant.  On 11/19/2015 Denise Mckenzie underwent left lumpectomy with sentinel lymph node sampling. The final pathology ([id number]) confirmed an invasive ductal carcinoma, grade 2, measuring 1.2 cm. Invasive disease was focally present at the  inferior margin. In situ disease was less than 0.1 cm to the inferior margin as well. All 4 sentinel lymph nodes  were clear. Repeat HER-2 was again negative with a signals ratio of 1.43, the number per cell being 3.50.  Her subsequent history is as detailed below.  INTERVAL HISTORY: Denise Mckenzie returns today for follow-up of her breast cancer. She completed her radiation treatments in June, and we waited until early July to start on anastrozole. She tells me she is tolerating it well. Hot flashes and vaginal dryness are not a major issue. She never developed the arthralgias or myalgias that many patients can experience on this medication. She obtains it at a good price.  As far as her lung cancer is concerned she met and [facility] with Dr. Tammi Klippel and she has been set up for a CT scan of the chest mid-October.  REVIEW OF SYSTEMS: Denise Mckenzie is slowly recovering her energy and thinks she is near normal again. She does still complain of a little bit of fatigue. Her big worry though is her daughter-in-law who has a history of pancreatic cancer now with liver metastases. She lives in no crit. The 2 grandchildren are 73 and 47 a detailed review of systems today was otherwise stable   Past Medical History:  Diagnosis Date  . Arthritis   . Cancer ([place])    lung cancer  . COPD (chronic obstructive pulmonary disease) ([place])   . Coronary artery disease   . Depression   . Diabetes mellitus ([place]) 10/22/2015  . Full dentures    full upper dentures, plate on bottom  . History of bronchitis   . History of pneumonia   . Hyperlipidemia   . Hypertension   . Hypothyroidism 10/22/2015  . Myocardial infarction [facility]) July 1995  . Osteoporosis 10/22/2015  . Stress incontinence     PAST SURGICAL HISTORY: Past Surgical History:  Procedure Laterality Date  . ABDOMINAL HYSTERECTOMY     1968  . APPENDECTOMY     with hysterectomy  . [place]  . CARDIAC CATHETERIZATION  1995, 1996   Dr. Wynonia Lawman saw here then here at [facility] both times; 'they did a balloon and opened it up' first time, the 2nd was scheduled d/t abnormal stress  test  . COLONOSCOPY W/ POLYPECTOMY    . CORONARY ARTERY BYPASS GRAFT     11/1994  . 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 LUNG BIOPSY N/A 11/13/2015   Procedure: RIGHT LUNG BIOPSY;  Surgeon: Grace Isaac, MD;  Location: [place] [facility];  Service: Thoracic;  Laterality: N/A;  . MULTIPLE TOOTH EXTRACTIONS    . RADIOACTIVE SEED GUIDED MASTECTOMY WITH AXILLARY SENTINEL LYMPH NODE BIOPSY Left 11/19/2015   Procedure: RADIOACTIVE SEED GUIDED PARTIAL MASTECTOMY WITH AXILLARY SENTINEL LYMPH NODE BIOPSY;  Surgeon: Donnie Mesa, MD;  Location: [facility];  Service: General;  Laterality: Left;  . RE-EXCISION OF BREAST LUMPECTOMY Left 12/03/2015   Procedure: RE-EXCISION INFERIOR MARGIN OF LEFT BREAST LUMPECTOMY;  Surgeon: Donnie Mesa, MD;  Location: [facility];  Service: General;  Laterality: Left;  . TMJ ARTHROPLASTY     1979  . TONSILLECTOMY    . VIDEO BRONCHOSCOPY WITH ENDOBRONCHIAL NAVIGATION N/A 11/13/2015   Procedure: VIDEO BRONCHOSCOPY WITH ENDOBRONCHIAL NAVIGATION, with placement of fudicial markers;  Surgeon: Grace Isaac, MD;  Location: [facility];  Service: Thoracic;  Laterality: N/A;    FAMILY HISTORY Family History  Problem Relation Age of Onset  . Alzheimer's disease Mother   . Heart attack Father   The patient's father died from a heart attack at age 35. The patient's mother died with Alzheimer's disease at age 81. Denise Mckenzie has one brother and one sister. The only cancer in the family was breast cancer in a maternal aunt diagnosed in her early 48s.  GYNECOLOGIC HISTORY:  No LMP recorded. Patient has had a hysterectomy. Menarche age 44, first live birth age 62. The patient is GX P2. She underwent total abdominal hysterectomy with bilateral salpingo-oophorectomy 1968. She did not take hormone replacement.  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 Denise Mckenzie lives in [place]. The patient's daughter Sherri Rad lives in [place] and works in Programmer, applications. The patient has 3 grandchildren and 2 great-grandchildren. Her youngest grandchild graduated from high school June 2017. She attends a [employer] in [place]: In place   HEALTH MAINTENANCE: Social History  Substance Use Topics  . Smoking status: Former Smoker    Quit date: 08/17/1994  . Smokeless tobacco: Never Used  . Alcohol use No     Colonoscopy:2010/[facility]  PAP: 2015  Bone density: 2016/[facility]  Lipid panel:  No Known Allergies  Current Outpatient Prescriptions  Medication Sig Dispense Refill  . acetaminophen (TYLENOL) 500 MG tablet Take 1,000 mg by mouth 2 (two) times daily as needed (pain). Reported on 01/20/2016    . amitriptyline (ELAVIL) 50 MG tablet Take 50 mg by mouth at bedtime.      Marland Kitchen anastrozole (ARIMIDEX) 1 MG tablet Take 1 tablet (1 mg total) by mouth daily. 90 tablet 4  . aspirin EC 325 MG tablet Take 325 mg by mouth daily.    Marland Kitchen atorvastatin (LIPITOR) 40 MG tablet Take 40 mg by mouth at bedtime.     . Calcium Citrate-Vitamin D (CALCIUM + D PO) Take 2 tablets by mouth 3 (three) times daily. Each tablet Calcium 300 mg, Vitamin D3 250 I.U.    . ibandronate (BONIVA) 150 MG tablet Take 150 mg by mouth every 30 (thirty) days. On the 4th Monday of each month  4  . Krill Oil 1000 MG CAPS Take 1,000 mg by mouth daily.    Marland Kitchen levothyroxine (SYNTHROID, LEVOTHROID) 88 MCG tablet Take 88 mcg by mouth daily before breakfast.     . losartan-hydrochlorothiazide (HYZAAR) 100-12.5 MG per tablet Take 1 tablet by mouth daily.      . metFORMIN (GLUCOPHAGE) 500 MG tablet Take 500 mg by mouth daily.  4  . metoprolol (LOPRESSOR) 50 MG tablet Take 25 mg by mouth 2 (two) times daily. Reported on 01/28/2016    . nitroGLYCERIN (NITROSTAT) 0.4 MG SL tablet Place 0.4 mg under the tongue every 5 (five) minutes as needed for  chest pain. Reported on 01/28/2016     No current facility-administered medications for this visit.     OBJECTIVE: Middle-aged white woman In no acute distress  Vitals:   04/29/16 1039  BP: 132/67  Pulse: 66  Resp: 18  Temp: 98.3 F (36.8 C)     Body mass index is 30.07 kg/m.    ECOG FS:0 - Asymptomatic  Sclerae unicteric, EOMs intact Oropharynx clear and moist No cervical or supraclavicular adenopathy Lungs no rales or rhonchi Heart regular rate and rhythm Abd soft, nontender, positive bowel  sounds MSK no focal spinal tenderness, no upper extremity lymphedema Neuro: nonfocal, well oriented, appropriate affect Breasts: The right breast is unremarkable. The left breast is status post lumpectomy and radiation. There is mild residual hyperpigmentation. There is no suspicious mass and no evidence of recurrence. The left axilla is benign.  LAB RESULTS:  CMP     Component Value Date/Time   NA 139 11/27/2015 1547   K 3.6 11/27/2015 1547   CL 95 (L) 11/11/2015 1535   CO2 33 (H) 11/27/2015 1547   GLUCOSE 88 11/27/2015 1547   BUN 10.7 11/27/2015 1547   CREATININE 0.9 11/27/2015 1547   CALCIUM 9.7 11/27/2015 1547   PROT 7.3 11/27/2015 1547   ALBUMIN 3.6 11/27/2015 1547   AST 18 11/27/2015 1547   ALT 16 11/27/2015 1547   ALKPHOS 90 11/27/2015 1547   BILITOT 0.39 11/27/2015 1547   GFRNONAA >60 11/11/2015 1535   GFRAA >60 11/11/2015 1535    INo results found for: SPEP, UPEP  Lab Results  Component Value Date   WBC 9.6 04/29/2016   NEUTROABS 7.0 (H) 04/29/2016   HGB 14.8 04/29/2016   HCT 44.4 04/29/2016   MCV 90.2 04/29/2016   PLT 203 04/29/2016      Chemistry      Component Value Date/Time   NA 139 11/27/2015 1547   K 3.6 11/27/2015 1547   CL 95 (L) 11/11/2015 1535   CO2 33 (H) 11/27/2015 1547   BUN 10.7 11/27/2015 1547   CREATININE 0.9 11/27/2015 1547      Component Value Date/Time   CALCIUM 9.7 11/27/2015 1547   ALKPHOS 90 11/27/2015 1547   AST 18  11/27/2015 1547   ALT 16 11/27/2015 1547   BILITOT 0.39 11/27/2015 1547       No results found for: LABCA2  No components found for: LABCA125  No results for input(s): INR in the last 168 hours.  Urinalysis No results found for: COLORURINE, APPEARANCEUR, LABSPEC, PHURINE, GLUCOSEU, HGBUR, BILIRUBINUR, KETONESUR, PROTEINUR, UROBILINOGEN, NITRITE, LEUKOCYTESUR    ELIGIBLE FOR AVAILABLE RESEARCH PROTOCOL: No  STUDIES: No results found.  ASSESSMENT: 70 y.o. Denise Mckenzie woman  (1) hypermetabolic right upper lobe lung mass noted on chest CT scan 09/11/2015, clinically T1a N0  (a) biopsy by video bronchoscopy 11/13/2015 nondiagnostic  (b) definitive stereotactic radiosurgery Completed 01/21/2016  (2) biopsy of a left upper outer quadrant breast mass 10/28/2015 showed a clinical T1c N0, stage IA invasive ductal carcinoma, grade 2, estrogen and progesterone strongly positive, HER-2 negative, with an MIB-1 of 6%.  (3) status post left lumpectomy and sentinel lymph node sampling 11/19/2015 for apT1c pN0, stage IA invasive ductal carcinoma, grade 2, repeat HER-2 again negative, with a focally positive inferior margin  (a) additional surgery for margin clearance  12/03/2015   (4) Adjuvant radiation  01/08/16 - 01/30/16: Left Breast                                                 01/14/16, 01/17/16, 01/21/16: Right Upper Lung  Site/dose: Left Breast: 42.720 Gy in 16 fractions                    Right Upper Lung: 54 Gy in 3 fractions  Beams/energy: Left Breast: 3D // 10X, 15X Photon  Right Upper Lung: SBRT/SRT-3D // 6FFF Photon  (5) started anastrozole 02/18/2016   PLAN: Denise Mckenzie is tolerating the anastrozole remarkably well. The plan will be to continue that for a total of 5 years.  She tells me she had a bone density at [facility] in 2016. I do not have that report would we will obtain it. If there is a significant concern we can consider other agents but she is already  on ibandronate and she tells me the report said the bone density was improving.  She has a CT scan scheduled through Dr. Johny Shears office before her return visit with radiation oncology in mid October. She also has an appointment with her surgeon in January 2018.  Accordingly she will see me again late April, after her next set of mammograms.  She knows to call for any problems that may develop before that visit Chauncey Cruel, MD   04/29/2016 11:07 AM Medical Oncology and Hematology [facility] [place], [place] [address] Tel. [phone]    Fax. [phone]

## 2016-04-29 NOTE — Telephone Encounter (Signed)
Called [facility] for copy of bone density test done in 2016. [facility] unable to find chart. Called pt and LVM to either call us or call [facility] to help find her chart.

## 2016-05-13 ENCOUNTER — Telehealth: Payer: Self-pay | Admitting: *Deleted

## 2016-05-13 NOTE — Telephone Encounter (Signed)
CALLED PATIENT TO INFORM OF [facility] APPT. ON 05-20-16 @ 11:30 AM WITH GRETCHEN DAWSON, AND HER CT ON 06-01-16- ARRIVAL TIME - 8:45 AM @ [facility] AND NO RESTRICTIONS ON THIS TEST PER RADIOLOGY SCHEDULING AND HER  FU WITH ALISON PERKINS ON 06-02-16 @ 1:50 PM FOR RESULTS, SPOKE WITH PATIENT AND SHE IS AWARE OF THESE APPTS.

## 2016-05-20 ENCOUNTER — Ambulatory Visit (HOSPITAL_BASED_OUTPATIENT_CLINIC_OR_DEPARTMENT_OTHER): Payer: Medicare Other | Admitting: Adult Health

## 2016-05-20 ENCOUNTER — Telehealth: Payer: Self-pay

## 2016-05-20 ENCOUNTER — Encounter: Payer: Self-pay | Admitting: Adult Health

## 2016-05-20 VITALS — BP 119/95 | HR 66 | Temp 98.1°F | Resp 18 | Ht 62.0 in | Wt 164.7 lb

## 2016-05-20 DIAGNOSIS — R5383 Other fatigue: Secondary | ICD-10-CM | POA: Diagnosis not present

## 2016-05-20 DIAGNOSIS — C3411 Malignant neoplasm of upper lobe, right bronchus or lung: Secondary | ICD-10-CM | POA: Diagnosis not present

## 2016-05-20 DIAGNOSIS — G479 Sleep disorder, unspecified: Secondary | ICD-10-CM

## 2016-05-20 DIAGNOSIS — M810 Age-related osteoporosis without current pathological fracture: Secondary | ICD-10-CM | POA: Diagnosis not present

## 2016-05-20 DIAGNOSIS — Z170 Estrogen receptor positive status [ER+]: Secondary | ICD-10-CM

## 2016-05-20 DIAGNOSIS — C50412 Malignant neoplasm of upper-outer quadrant of left female breast: Secondary | ICD-10-CM

## 2016-05-20 DIAGNOSIS — Z23 Encounter for immunization: Secondary | ICD-10-CM | POA: Diagnosis not present

## 2016-05-20 DIAGNOSIS — Z79811 Long term (current) use of aromatase inhibitors: Secondary | ICD-10-CM

## 2016-05-20 MED ORDER — INFLUENZA VAC SPLIT QUAD 0.5 ML IM SUSY
0.5000 mL | PREFILLED_SYRINGE | Freq: Once | INTRAMUSCULAR | Status: AC
  Administered 2016-05-20: 0.5 mL via INTRAMUSCULAR
  Filled 2016-05-20: qty 0.5

## 2016-05-20 NOTE — Progress Notes (Signed)
CLINIC:  Survivorship   REASON FOR VISIT:  Routine follow-up post-treatment for a recent history of synchronous breast and lung cancers.  BRIEF ONCOLOGIC HISTORY:    Breast cancer of upper-outer quadrant of left female breast ([place])   08/30/2014 Imaging    DEXA scan: Osteoporosis       10/11/2015 PET scan    Intensely hypermetabolic 1.4 cm RUL nodule, primary bronchogenic carcinoma. No hypermetabolic thoracic nodal mets or distant mets.  PET CT staging criteria is T1aN0M0: Stage IA.  Also noted to have 1 cm soft tissue nodule in left breast, cannot exclude breat malignancy.       10/28/2015 Mammogram    Left breast (UOQ) with 10 mm spiculated mass on mammo.  [place] with spiculated hypoechoic mass in left breast at 2 o'clock measuring 8 x 8 x 10 mm. Also left axilla with 1.2 LN with prominent cortex.       11/04/2015 Initial Biopsy    Left breast needle biopsy (UOQ): IDC, grade 2, ER+ (100%), PR+ (100%), Ki67 6%, HER2 neg (1.32).  Left axilla needle biopsy: Negative for carcinoma.       11/04/2015 Initial Diagnosis    Breast cancer of upper-outer quadrant of left female breast ([place])      11/19/2015 Surgery    (L) lympectomy with SLNB ([place]): IDC, grade 2, 1.2 cm, low grade DCIS, IDC focally present at inferior margin, DCIS focally <0.1 cm to inferior margin. 0/4 SLN.  HER2 repeated and remains neg (ratio 1.43).  pT1c, pN0: Stage IA       12/03/2015 Surgery    (L) breast re-excision ([place]): Focal ADH, negative margins.       01/08/2016 - 01/30/2016 Radiation Therapy    Adjuvant radiation Pablo Ledger): Left Breast: 42.720 Gy in 16 fractions.  (also treated for right lung cancer with SBRT Right Upper Lung: 54 Gy in 3 fractions on 01/14/16, 01/17/16, & 01/21/16).       02/18/2016 -  Anti-estrogen oral therapy    Anastrozole 1 mg daily. Planned duration of therapy: 5 years        Primary cancer of right upper lobe of lung ([place])   09/02/2015 Imaging    CXR done for shortness of breath & cough:   1.3 cm rounded nodular density projection over (R) lung apex; indistinct density projecting over lingula. Chest CT recommended.        09/11/2015 Imaging    CT chest: 11 mm nodule in RUL corresponding with density on CXR, cannot exclude malignancy. Consider PET scan. Other small scattered nodules in lungs, too small to characterize. Also lingular scarring noted.       10/11/2015 PET scan    Intensely hypermetabolic 1.4 cm RUL nodule, primary bronchogenic carcinoma. No hypermetabolic thoracic nodal mets or distant mets.  PET CT staging criteria is T1aN0M0: Stage IA.  Also noted to have 1 cm soft tissue nodule in left breast, cannot exclude breat malignancy.       10/22/2015 Imaging    CT brain: No metastatic disease.       11/13/2015 Initial Biopsy    RUL lung biopsy: Blood clot and scant mucosal tissue; fragment too small for definitive diagnosis and diagnostic malignancy not identified. [discussed at [facility] Tumor Board and felt to be discordant with imaging]      11/13/2015 Pathology Results    Transbronchial needle aspiration/brushing (RUL): Atypical cells in 1 of 3 samples.      11/27/2015 Initial Diagnosis    Primary cancer of  right upper lobe of lung ([place])     01/14/2016 - 01/21/2016 Radiation Therapy    SBRT lung Pablo Ledger). Right Upper Lung: 54 Gy in 3 fractions on 01/14/16, 01/17/16, & 01/21/16.  [also treated for breast cancer to Left Breast: 42.720 Gy in 16 fractions from 01/08/16-01/30/16].                            INTERVAL HISTORY:  Denise Mckenzie presents to the [facility] today for our initial meeting to review her survivorship care plan detailing her treatment course for breast cancer, as well as monitoring long-term side effects of that treatment, education regarding health maintenance, screening, and overall wellness and health promotion.     Overall, Denise Mckenzie reports feeling quite well since completing her radiation therapy approximately 4 months ago.  For her breast  cancer, she started the anastrozole in early 02/2016. She has been tolerating the medication well. She denies any hot flashes, arthralgias, or vaginal dryness. She continues to have some fatigue, but this is better since completing radiation. She is walking regularly for exercise.  For her lung cancer, she completed SBRT therapy about 4 months ago as well. She endorses occasional dry cough. Otherwise she is largely without complaints regarding her breast or lung cancers.  She tells me that she has not been sleeping well lately. She recently learned that her daughter-in-law has pancreatic cancer; she underwent Whipple surgery and adjuvant chemotherapy for what they thought was early stage disease. She had her first repeat CT scan which showed liver lesions, concerning for metastatic disease. Denise Mckenzie's son has been very distraught over this news, and the patient has been providing additional support for him. Denise Mckenzie's son and daughter-in-law have 2 children who are not aware of the pancreatic cancer diagnosis.  The patient states "with all that has been going on with my health, I'm doing great. I'm just trying to be strong for my son and daughter-in-law."   REVIEW OF SYSTEMS:  Review of Systems  Constitutional: Positive for malaise/fatigue.       Fatigue, but much improved since completing radiation.  HENT: Negative.   Eyes: Negative.   Respiratory: Positive for cough.        Intermittent dry cough  Cardiovascular: Negative.   Gastrointestinal: Negative.   Genitourinary: Negative.   Musculoskeletal: Negative.   Skin:       Skin on chest is hyperpigmented, but is healed.  Neurological: Negative.   Endo/Heme/Allergies: Negative.   Psychiatric/Behavioral: The patient is nervous/anxious and has insomnia.   GU: Denies vaginal bleeding, discharge, or dryness.  Breast: Denies any new nodularity, masses, tenderness, nipple changes, or nipple discharge.    A 14-point review of systems was  completed and was negative, except as noted above.   ONCOLOGY TREATMENT TEAM:  BREAST 1. Surgeon:  Dr. Gershon Crane at [facility] 2. Medical Oncologist: Dr. Jana Hakim  3. Radiation Oncologist: Dr. Merton Border  LUNG 1. Cardiothoracic Surgeon: Dr. Servando Snare 2. Medical Oncologist: Dr. Jana Hakim 3. Radiation Oncologist: Dr. Merton Border     PAST MEDICAL/SURGICAL HISTORY:  Past Medical History:  Diagnosis Date  . Arthritis   . Cancer ([place])    lung cancer  . COPD (chronic obstructive pulmonary disease) ([place])   . Coronary artery disease   . Depression   . Diabetes mellitus ([place]) 10/22/2015  . Full dentures    full upper dentures, plate on bottom  . History of bronchitis   .  History of pneumonia   . Hyperlipidemia   . Hypertension   . Hypothyroidism 10/22/2015  . Myocardial infarction July 1995  . Osteoporosis 10/22/2015  . Stress incontinence    Past Surgical History:  Procedure Laterality Date  . ABDOMINAL HYSTERECTOMY     1968  . APPENDECTOMY     with hysterectomy  . [place]  . CARDIAC CATHETERIZATION  1995, 1996   Dr. Wynonia Lawman saw here then here at [facility] both times; 'they did a balloon and opened it up' first time, the 2nd was scheduled d/t abnormal stress test  . COLONOSCOPY W/ POLYPECTOMY    . CORONARY ARTERY BYPASS GRAFT     11/1994  . 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 LUNG BIOPSY N/A 11/13/2015   Procedure: RIGHT LUNG BIOPSY;  Surgeon: Grace Isaac, MD;  Location: [facility];  Service: Thoracic;  Laterality: N/A;  . MULTIPLE TOOTH EXTRACTIONS    . RADIOACTIVE SEED GUIDED MASTECTOMY WITH AXILLARY SENTINEL LYMPH NODE BIOPSY Left 11/19/2015   Procedure: RADIOACTIVE SEED GUIDED PARTIAL MASTECTOMY WITH AXILLARY SENTINEL LYMPH NODE BIOPSY;  Surgeon: Donnie Mesa, MD;  Location: [facility];  Service: General;  Laterality: Left;  . RE-EXCISION OF BREAST LUMPECTOMY Left 12/03/2015    Procedure: RE-EXCISION INFERIOR MARGIN OF LEFT BREAST LUMPECTOMY;  Surgeon: Donnie Mesa, MD;  Location: [place];  Service: General;  Laterality: Left;  . TMJ ARTHROPLASTY     1979  . TONSILLECTOMY    . VIDEO BRONCHOSCOPY WITH ENDOBRONCHIAL NAVIGATION N/A 11/13/2015   Procedure: VIDEO BRONCHOSCOPY WITH ENDOBRONCHIAL NAVIGATION, with placement of fudicial markers;  Surgeon: Grace Isaac, MD;  Location: [facility];  Service: Thoracic;  Laterality: N/A;     ALLERGIES:  No Known Allergies   CURRENT MEDICATIONS:  Outpatient Encounter Prescriptions as of 05/20/2016  Medication Sig Note  . acetaminophen (TYLENOL) 500 MG tablet Take 1,000 mg by mouth 2 (two) times daily as needed (pain). Reported on 01/20/2016   . amitriptyline (ELAVIL) 50 MG tablet Take 50 mg by mouth at bedtime.     Marland Kitchen anastrozole (ARIMIDEX) 1 MG tablet Take 1 tablet (1 mg total) by mouth daily.   Marland Kitchen aspirin EC 325 MG tablet Take 325 mg by mouth daily.   Marland Kitchen atorvastatin (LIPITOR) 40 MG tablet Take 40 mg by mouth at bedtime.    . Calcium Citrate-Vitamin D (CALCIUM + D PO) Take 2 tablets by mouth 3 (three) times daily. Each tablet Calcium 300 mg, Vitamin D3 250 I.U.   . ibandronate (BONIVA) 150 MG tablet Take 150 mg by mouth every 30 (thirty) days. On the 4th Monday of each month   . Krill Oil 1000 MG CAPS Take 1,000 mg by mouth daily.   Marland Kitchen levothyroxine (SYNTHROID, LEVOTHROID) 88 MCG tablet Take 88 mcg by mouth daily before breakfast.    . losartan-hydrochlorothiazide (HYZAAR) 100-12.5 MG per tablet Take 1 tablet by mouth daily.     . metFORMIN (GLUCOPHAGE) 500 MG tablet Take 500 mg by mouth daily.   . metoprolol (LOPRESSOR) 50 MG tablet Take 25 mg by mouth 2 (two) times daily. Reported on 01/28/2016   . nitroGLYCERIN (NITROSTAT) 0.4 MG SL tablet Place 0.4 mg under the tongue every 5 (five) minutes as needed for chest pain. Reported on 01/28/2016 11/08/2015: Has not used since 1996 but keeps on hand for emergencies   No  facility-administered encounter medications on file as  of 05/20/2016.      ONCOLOGIC FAMILY HISTORY:  Family History  Problem Relation Age of Onset  . Alzheimer's disease Mother   . Heart attack Father      GENETIC COUNSELING/TESTING: None.  SOCIAL HISTORY:  CAYLEEN BENJAMIN is married and lives with her husband in [place], [place].  She has 1 son and 1 daughter. She has 3 grandchildren and 2 great-grandchildren. Denise Mckenzie is currently retired; she previously worked in Therapist, art.  She has a remote history of smoking; she quit in 1996. Denies any current tobacco use. Denies any alcohol or illicit drug use as well.   PHYSICAL EXAMINATION:  Vital Signs: Vitals:   05/20/16 1128  BP: (!) 119/95  Pulse: 66  Resp: 18  Temp: 98.1 F (36.7 C)   Filed Weights   05/20/16 1128  Weight: 164 lb 11.2 oz (74.7 kg)   General: Well-nourished, well-appearing female in no acute distress.  She is unaccompanied today.   HEENT: Head is normocephalic.  Pupils equal and reactive to light. Conjunctivae clear without exudate.  Sclerae anicteric. Oral mucosa is pink, moist.  Oropharynx is pink without lesions or erythema.  Lymph: No cervical, supraclavicular, or infraclavicular lymphadenopathy noted on palpation.  Cardiovascular: Regular rate and rhythm. Respiratory: Clear to auscultation bilaterally. Chest expansion symmetric; breathing non-labored.  GI: Abdomen soft and round; non-tender, non-distended. Bowel sounds normoactive.  GU: Deferred.  Neuro: No focal deficits. Steady gait.  Psych: Mood and affect normal and appropriate for situation.  Extremities: No edema. Skin: Warm and dry.  LABORATORY DATA:  None for this visit.  DIAGNOSTIC IMAGING:  None for this visit.      ASSESSMENT AND PLAN:  BREAST DeniseChapman Mckenzie is a pleasant 70 y.o. female with Stage IA left breast invasive ductal carcinoma, ER+/PR+/HER2-, diagnosed in 10/2015, treated with lumpectomy, adjuvant radiation therapy, and  anti-estrogen therapy with anastrozole beginning in 02/2016.  She presents to the Survivorship Clinic for our initial meeting and routine follow-up post-completion of treatment for breast cancer.  LUNG Denise Mckenzie is a pleasant 70 y.o. female with synchronous stage IA presumably non-small cell lung cancer to the right upper lobe, diagnosed in 10/2015. She was determined not to be a surgical candidate and therefore was treated with SBRT therapy in 3 fractions on 01/14/16, 01/17/16, and 01/21/16.  1. Stage IA left breast cancer:  Denise Mckenzie is continuing to recover from definitive treatment for breast cancer. She will be due for her annual mammogram in 10/2016; orders placed today. She will follow-up with her surgeon, Dr. Gershon Crane, in 08/2016. She will follow-up with her medical oncologist, Dr. Jana Hakim, and 11/2016 with history and physical exam per surveillance protocol.  She will continue her anti-estrogen therapy with anastrozole. Thus far, she is tolerating the medication well, with minimal side effects. Common side effects of anastrozole were again reviewed with her as well. Today, a comprehensive survivorship care plan and treatment summary was reviewed with the patient today detailing her breast cancer diagnosis, treatment course, potential late/long-term effects of treatment, appropriate follow-up care with recommendations for the future, and patient education resources.  A copy of this summary, along with a letter will be sent to the patient's primary care provider via mail/fax/In Basket message after today's visit.    2. Stage IA right upper lobe lung cancer: Denise Mckenzie is continuing to recover from definitive radiation therapy for her lung cancer. She has intermittent dry cough, which I reassured her was normal after radiation therapy. She is scheduled for  post-treatment CT scan of the chest on 06/01/16, and will follow-up with Shona Simpson, PA in radiation oncology the following day to receive the results of that  scan.   3. Fatigue/Sleep disturbance: Her reported fatigue since completing radiation therapy is improving. She was walking for exercise, but has not been as diligent about this in recent weeks given the illness of her daughter-in-law. She generally takes amitriptyline at bedtime to help with sleep; this has not been effective in recent weeks given her increased stress. However, she reports that the amitriptyline has historically been effective. She is hopeful that once they receive more answers regarding her daughter-in-law's health, she will be able to get a better night's rest.   4. Bone health:  Given Denise Mckenzie's  age, history of breast cancer, and her current treatment regimen including anti-estrogen therapy with anastrozole, she is at risk for bone demineralization. Her last DEXA scan was 08/30/14 and showed osteoporosis. The patient thought that she had a vDEXA scan in January 2017, but there are no records available at [place], or [facility] for that date. Therefore we will proceed with biennial DEXA imaging, to be done in 10/2016 at the time of her annual mammogram. The patient prefers a coordinated visit on the same day for both her DEXA & mammogram, given that she drives from [place] to [place] for these appointments. She understands that she should continue her [place], calcium, and vitamin D supplementation. I also encouraged her to increase her consumption of foods rich in calcium, as well as increase her weight bearing activities. She was given educational specific activities to promote bone health.   5. Annual flu vaccine: Denise Mckenzie received her annual flu shot today at the cancer center administered by RN.  6. Cancer screening:  Due to Denise Mckenzie's history and her age, she should receive screening for skin cancers, colon cancer, and gynecologic cancers.  The information and recommendations are listed on the patient's comprehensive care plan/treatment summary and were  reviewed in detail with the patient.    7. Health maintenance and wellness promotion: Denise Mckenzie was encouraged to consume 5-7 servings of fruits and vegetables per day. We reviewed the "Nutrition Rainbow" handout, as well as the handout "Take Control of Your Health and Reduce Your Cancer Risk" from the [place].  She was also encouraged to engage in moderate to vigorous exercise for 30 minutes per day most days of the week. We discussed the [employer] fitness program, which is designed for cancer survivors to help them become more physically fit after cancer treatments.  She was instructed to limit her alcohol consumption and continue to abstain from tobacco use.   8. Support services/counseling: It is not uncommon for this period of the patient's cancer care trajectory to be one of many emotions and stressors.  We discussed an opportunity for her to participate in the next session of [facility] ("Finding Your New Normal") support group series designed for patients after they have completed treatment.   Denise Mckenzie was encouraged to take advantage of our many other support services programs, support groups, and/or counseling in coping with her new life as a cancer survivor after completing anti-cancer treatment.  She was offered support today through active listening and expressive supportive counseling.  She was given information regarding our available services and encouraged to contact me with any questions or for help enrolling in any of our support group/programs.    Dispo:   -CT chest due on  06/01/16; she will see Rad Onc on 06/02/16 to get the results of this scan. -Mammogram and DEXA scan due in 10/2016; orders placed today to coordinate for same day appointment. -Return to cancer center in 11/2016 to see Dr. Jana Hakim. -She is welcome to return back to the Survivorship Clinic at any time; no additional follow-up needed at this time.  -Consider referral back to survivorship as a long-term  survivor for continued surveillance  A total of 60 minutes of face-to-face time was spent with this patient with greater than 50% of that time in counseling and care-coordination.   Mike Craze, NP Survivorship Program [facility] [phone]   Note: PRIMARY CARE PROVIDER Myriam Jacobson, [place] [phone]

## 2016-05-20 NOTE — Telephone Encounter (Signed)
Told Denise Mckenzie that Denise Mckenzie got her an appointment at the [facility] for a DEXA  Scan and her screening mammogram  For 10-28-16 at the [facility]. She needs to arrive at 1140 am for a 12noon appointment. The Dexa will be first then the mammogram. She is not to take any calcium for 48 hrs prior to the appointment. Denise Mckenzie wrote the  information down and verbalized understanding.

## 2016-06-01 ENCOUNTER — Ambulatory Visit (HOSPITAL_COMMUNITY)
Admission: RE | Admit: 2016-06-01 | Discharge: 2016-06-01 | Disposition: A | Discharge status: Home / Self Care | Payer: Medicare Other | Source: Ambulatory Visit | Attending: Radiation Oncology | Admitting: Radiation Oncology

## 2016-06-01 DIAGNOSIS — I700 Atherosclerosis of aorta: Secondary | ICD-10-CM | POA: Diagnosis not present

## 2016-06-01 DIAGNOSIS — C3411 Malignant neoplasm of upper lobe, right bronchus or lung: Secondary | ICD-10-CM | POA: Diagnosis not present

## 2016-06-01 DIAGNOSIS — D3502 Benign neoplasm of left adrenal gland: Secondary | ICD-10-CM | POA: Diagnosis not present

## 2016-06-01 DIAGNOSIS — N6489 Other specified disorders of breast: Secondary | ICD-10-CM | POA: Diagnosis not present

## 2016-06-01 DIAGNOSIS — R918 Other nonspecific abnormal finding of lung field: Secondary | ICD-10-CM | POA: Insufficient documentation

## 2016-06-01 DIAGNOSIS — I2510 Atherosclerotic heart disease of native coronary artery without angina pectoris: Secondary | ICD-10-CM | POA: Diagnosis not present

## 2016-06-01 DIAGNOSIS — Z951 Presence of aortocoronary bypass graft: Secondary | ICD-10-CM | POA: Diagnosis not present

## 2016-06-01 DIAGNOSIS — D3501 Benign neoplasm of right adrenal gland: Secondary | ICD-10-CM | POA: Insufficient documentation

## 2016-06-02 ENCOUNTER — Ambulatory Visit
Admission: RE | Admit: 2016-06-02 | Discharge: 2016-06-02 | Disposition: A | Discharge status: Home / Self Care | Payer: Medicare Other | Source: Ambulatory Visit | Attending: Radiation Oncology | Admitting: Radiation Oncology

## 2016-06-02 ENCOUNTER — Encounter: Payer: Self-pay | Admitting: Radiation Oncology

## 2016-06-02 VITALS — BP 130/63 | HR 69 | Temp 98.2°F | Resp 16 | Ht 62.0 in | Wt 167.6 lb

## 2016-06-02 DIAGNOSIS — I700 Atherosclerosis of aorta: Secondary | ICD-10-CM | POA: Insufficient documentation

## 2016-06-02 DIAGNOSIS — C3411 Malignant neoplasm of upper lobe, right bronchus or lung: Secondary | ICD-10-CM

## 2016-06-02 DIAGNOSIS — Z951 Presence of aortocoronary bypass graft: Secondary | ICD-10-CM | POA: Diagnosis not present

## 2016-06-02 DIAGNOSIS — C50912 Malignant neoplasm of unspecified site of left female breast: Secondary | ICD-10-CM | POA: Diagnosis not present

## 2016-06-02 DIAGNOSIS — I2510 Atherosclerotic heart disease of native coronary artery without angina pectoris: Secondary | ICD-10-CM | POA: Diagnosis not present

## 2016-06-02 NOTE — Progress Notes (Signed)
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  Interval Since Last Radiation: 4 months   01/08/16 - 01/30/16: Left Breast- 42.720 Gy in 16 fractions 01/14/16, 01/17/16, 01/21/16: Right Upper Lung- 54 Gy in 3 fractions  Narrative:  The patient returns today for routine follow-up.  Since her last visit with Dr. Tammi Mckenzie, she has been started on estrogen lidocaine with Dr. Jana Mckenzie, she will see him in April 2018 for follow-up.  She did have a follow up CT chest that revealed improvement in her treated lesion in the right upper lobe, and some small bilateral pulmonary nodules.                             On review of systems, the patient states she is doing well overall. She feels as though her skin has healed well. She denies any concerns with chest pain, shortness of breath, fevers, or chills. No other complaints are verbalized.  ALLERGIES:  has No Known Allergies.  Meds: Current Outpatient Prescriptions  Medication Sig Dispense Refill  . acetaminophen (TYLENOL) 500 MG tablet Take 1,000 mg by mouth 2 (two) times daily as needed (pain). Reported on 01/20/2016    . amitriptyline (ELAVIL) 50 MG tablet Take 50 mg by mouth at bedtime.      Marland Kitchen anastrozole (ARIMIDEX) 1 MG tablet Take 1 tablet (1 mg total) by mouth daily. 90 tablet 4  . aspirin EC 325 MG tablet Take 325 mg by mouth daily.    Marland Kitchen atorvastatin (LIPITOR) 40 MG tablet Take 40 mg by mouth at bedtime.     . Calcium Citrate-Vitamin D (CALCIUM + D PO) Take 2 tablets by mouth 3 (three) times daily. Each tablet Calcium 300 mg, Vitamin D3 250 I.U.    . ibandronate (BONIVA) 150 MG tablet Take 150 mg by mouth every 30 (thirty) days. On  the 4th Monday of each month  4  . Krill Oil 1000 MG CAPS Take 1,000 mg by mouth daily.    Marland Kitchen levothyroxine (SYNTHROID, LEVOTHROID) 88 MCG tablet Take 88 mcg by mouth daily before breakfast.     . losartan-hydrochlorothiazide (HYZAAR) 100-12.5 MG per tablet Take 1 tablet by mouth daily.      . metFORMIN (GLUCOPHAGE) 500 MG tablet Take 500 mg by mouth daily.  4  . metoprolol (LOPRESSOR) 50 MG tablet Take 25 mg by mouth 2 (two) times daily. Reported on 01/28/2016    . nitroGLYCERIN (NITROSTAT) 0.4 MG SL tablet Place 0.4 mg under the tongue every 5 (five) minutes as needed for chest pain. Reported on 01/28/2016     No current facility-administered medications for this encounter.     Physical Findings:  height is '5\' 2"'  (1.575 m) and weight is 167 lb 9.6 oz (76 kg). Her oral temperature is 98.2 F (36.8 C). Her blood pressure is 130/63 and her pulse is 69. Her respiration is 16 and oxygen saturation is 100%.  In general this is a well appearing Caucasian female in no acute distress. She's alert and oriented x4 and appropriate throughout the examination. Cardiopulmonary assessment is negative for acute distress and she exhibits normal  effort. The left breast is intact with minimal hyperpigmentation without desquamation.  Lab Findings: Lab Results  Component Value Date   WBC 9.6 04/29/2016   HGB 14.8 04/29/2016   HCT 44.4 04/29/2016   MCV 90.2 04/29/2016   PLT 203 04/29/2016     Radiographic Findings: Ct Chest Wo Contrast  Result Date: 06/01/2016 CLINICAL DATA:  70 year old female with history of breast and lung cancer status post radiation therapy and lumpectomy. EXAM: CT CHEST WITHOUT CONTRAST TECHNIQUE: Multidetector CT imaging of the chest was performed following the standard protocol without IV contrast. COMPARISON:  Chest CT 09/11/2015.  PET-CT 10/11/2015. FINDINGS: Cardiovascular: Heart size is normal. There is no significant pericardial fluid, thickening or pericardial calcification.  There is aortic atherosclerosis, as well as atherosclerosis of the great vessels of the mediastinum and the coronary arteries, including calcified atherosclerotic plaque in the left main, left anterior descending, left circumflex and right coronary arteries. Status post median sternotomy for CABG, including LIMA to the LAD. Mediastinum/Nodes: No pathologically enlarged mediastinal, internal mammary or hilar lymph nodes. Please note that accurate exclusion of hilar adenopathy is limited on noncontrast CT scans. Esophagus is unremarkable in appearance. No axillary lymphadenopathy. Surgical clips in left axilla, presumably from prior lymph node dissection. Lungs/Pleura: Previously noted right upper lobe pulmonary nodule has decreased compared to the prior examination, currently a 1.4 x 0.7 cm macrolobulated nodule with spiculated margins (image 36 of series 5) decreased from 12 x 14 mm on prior study 10/11/2015. Today's study demonstrates several other tiny sub cm pulmonary nodules scattered throughout the lungs bilaterally, which are nonspecific, many of which are similar to the prior examination, favored to represent benign areas of mucoid impaction within terminal bronchioles. In addition, there is a new pleural-based lesion in the lingula (image 75 of series 5) which measures 12 x 6 mm (mean diameter of 9 mm), immediately deep to the lumpectomy site. No acute consolidative airspace disease. No pleural effusions. Mild diffuse bronchial wall thickening with very mild centrilobular and paraseptal emphysema, most apparent the lung apices. Upper Abdomen: 2.1 x 1.8 cm low-attenuation (2 HU) right adrenal nodule, and 1.4 x 1.1 cm low-attenuation (0 HU) left adrenal nodule are both similar to prior studies, compatible with adenomas. Aortic atherosclerosis. Musculoskeletal: Postoperative changes of left breast lumpectomy and left axillary lymph node dissection, new compared to the prior examination. In the deep soft tissues  of the left breast there is a new 3.8 x 4.4 cm low-attenuation mass-like area which is most compatible with a small postoperative seroma. Skin thickening throughout the left breast new compared to the prior examination. Median sternotomy wires. There are no aggressive appearing lytic or blastic lesions noted in the visualized portions of the skeleton. IMPRESSION: 1. Today's study demonstrates a positive response to therapy with decreased size of previously noted right upper lobe pulmonary nodule, which currently measures 1.4 x 0.7 cm. 2. Several new nodules are noted throughout the lungs bilaterally which are nonspecific. The largest of these is immediately deep to the lumpectomy site in the left breast, where there is a 9 mm pleural-based nodule in the lingula. This is nonspecific, and may reflect an area of evolving postradiation scarring. Attention all of these tiny nodules on follow-up studies is recommended to ensure stability. 3. Small seroma in the deep soft tissues of the left breast. 4. Aortic atherosclerosis, in addition to left main and 3 vessel coronary artery disease. Status post median sternotomy for CABG, including LIMA to the LAD. 5. Bilateral adrenal adenomas  again incidentally noted. Electronically Signed   By: Vinnie Langton M.D.   On: 06/01/2016 09:33    Impression/Plan: 1. 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 The patient will continue on estrogen blockade with Dr. Jana Mckenzie, and the followed in April 2018 with him. We will continue to see her as needed regarding this issue. She was counseled on vitamin E containing lotion as well. 2. Stage I putative non-small cell lung cancer of the right upper lobe. The patient's CT scan was reviewed, and we discussed the rationale for close follow-up with repeat CT scan in 3 months time. If this proves to be stable, we then move toward scans every 6 months. She states agreement and understanding. 3. Survivorship. The patient has met with  Mike Craze, NP in survivorship and will be followed as needed.     Carola Rhine, PAC

## 2016-06-02 NOTE — Progress Notes (Signed)
Ms. Denise Mckenzie returns for follow-up S/P XRT to her left breast.  Note swelling in the areola region and area is slightly firm to touch.  Note hyperpigmentation of her breast. She denies any pain nor tenderness.  She reports reduction in fatigue at this time.  Arimidex started on 02/18/16 and denies any side-effects at this time.    BP 130/63 (BP Location: Right Arm, Patient Position: Sitting, Cuff Size: Normal)   Pulse 69   Temp 98.2 F (36.8 C) (Oral)   Resp 16   Ht '5\' 2"'$  (1.575 m)   Wt 167 lb 9.6 oz (76 kg)   SpO2 100%   BMI 30.65 kg/m    Wt Readings from Last 3 Encounters:  06/02/16 167 lb 9.6 oz (76 kg)  05/20/16 164 lb 11.2 oz (74.7 kg)  04/29/16 164 lb 6.4 oz (74.6 kg)

## 2016-06-15 NOTE — Addendum Note (Signed)
Encounter addended by: Benn Moulder, RN on: 06/15/2016  6:19 PM<BR>    Actions taken: Charge Capture section accepted

## 2016-10-28 ENCOUNTER — Ambulatory Visit
Admission: RE | Admit: 2016-10-28 | Discharge: 2016-10-28 | Disposition: A | Discharge status: Home / Self Care | Payer: Medicare Other | Source: Ambulatory Visit | Attending: Adult Health | Admitting: Adult Health

## 2016-10-28 DIAGNOSIS — Z79811 Long term (current) use of aromatase inhibitors: Secondary | ICD-10-CM

## 2016-10-28 DIAGNOSIS — M810 Age-related osteoporosis without current pathological fracture: Secondary | ICD-10-CM

## 2016-10-28 DIAGNOSIS — Z170 Estrogen receptor positive status [ER+]: Principal | ICD-10-CM

## 2016-10-28 DIAGNOSIS — C50412 Malignant neoplasm of upper-outer quadrant of left female breast: Secondary | ICD-10-CM

## 2016-12-01 ENCOUNTER — Other Ambulatory Visit (HOSPITAL_BASED_OUTPATIENT_CLINIC_OR_DEPARTMENT_OTHER): Payer: Medicare Other

## 2016-12-01 DIAGNOSIS — C50412 Malignant neoplasm of upper-outer quadrant of left female breast: Secondary | ICD-10-CM

## 2016-12-01 DIAGNOSIS — C3411 Malignant neoplasm of upper lobe, right bronchus or lung: Secondary | ICD-10-CM

## 2016-12-01 LAB — COMPREHENSIVE METABOLIC PANEL
ALBUMIN: 3.5 g/dL (ref 3.5–5.0)
ALK PHOS: 152 U/L — AB (ref 40–150)
ALT: 27 U/L (ref 0–55)
AST: 24 U/L (ref 5–34)
Anion Gap: 8 mEq/L (ref 3–11)
BUN: 9.7 mg/dL (ref 7.0–26.0)
CO2: 28 mEq/L (ref 22–29)
CREATININE: 0.8 mg/dL (ref 0.6–1.1)
Calcium: 9.6 mg/dL (ref 8.4–10.4)
Chloride: 96 mEq/L — ABNORMAL LOW (ref 98–109)
EGFR: 72 mL/min/{1.73_m2} — ABNORMAL LOW (ref >90)
Glucose: 89 mg/dl (ref 70–140)
POTASSIUM: 4.8 meq/L (ref 3.5–5.1)
SODIUM: 132 meq/L — AB (ref 136–145)
Total Bilirubin: 0.65 mg/dL (ref 0.20–1.20)
Total Protein: 7.3 g/dL (ref 6.4–8.3)

## 2016-12-01 LAB — CBC WITH DIFFERENTIAL/PLATELET
BASO%: 1 % (ref 0.0–2.0)
Basophils Absolute: 0.1 10*3/uL (ref 0.0–0.1)
EOS%: 3.3 % (ref 0.0–7.0)
Eosinophils Absolute: 0.3 10*3/uL (ref 0.0–0.5)
HEMATOCRIT: 43.1 % (ref 34.8–46.6)
HEMOGLOBIN: 14.4 g/dL (ref 11.6–15.9)
LYMPH#: 1.5 10*3/uL (ref 0.9–3.3)
LYMPH%: 18.1 % (ref 14.0–49.7)
MCH: 29 pg (ref 25.1–34.0)
MCHC: 33.3 g/dL (ref 31.5–36.0)
MCV: 86.9 fL (ref 79.5–101.0)
MONO#: 0.7 10*3/uL (ref 0.1–0.9)
MONO%: 8.6 % (ref 0.0–14.0)
NEUT%: 69 % (ref 38.4–76.8)
NEUTROS ABS: 5.8 10*3/uL (ref 1.5–6.5)
Platelets: 210 10*3/uL (ref 145–400)
RBC: 4.95 10*6/uL (ref 3.70–5.45)
RDW: 14.6 % — AB (ref 11.2–14.5)
WBC: 8.4 10*3/uL (ref 3.9–10.3)

## 2016-12-08 ENCOUNTER — Ambulatory Visit (HOSPITAL_BASED_OUTPATIENT_CLINIC_OR_DEPARTMENT_OTHER): Payer: Medicare Other | Admitting: Oncology

## 2016-12-08 VITALS — BP 122/64 | HR 70 | Temp 97.8°F | Resp 17 | Ht 62.0 in | Wt 163.1 lb

## 2016-12-08 DIAGNOSIS — Z79811 Long term (current) use of aromatase inhibitors: Secondary | ICD-10-CM | POA: Diagnosis not present

## 2016-12-08 DIAGNOSIS — C50412 Malignant neoplasm of upper-outer quadrant of left female breast: Secondary | ICD-10-CM

## 2016-12-08 DIAGNOSIS — C3411 Malignant neoplasm of upper lobe, right bronchus or lung: Secondary | ICD-10-CM

## 2016-12-08 DIAGNOSIS — M810 Age-related osteoporosis without current pathological fracture: Secondary | ICD-10-CM

## 2016-12-08 DIAGNOSIS — Z170 Estrogen receptor positive status [ER+]: Secondary | ICD-10-CM | POA: Diagnosis not present

## 2016-12-08 NOTE — Progress Notes (Signed)
[facility]  Telephone:(336) [phone] Fax:(336) [phone]     ID: Denise Mckenzie DOB: February 21, 1946  MR#: [id number]  [facility]#:[id number]  Patient Care Team: Lorene Dy, MD as PCP - General (Internal Medicine) Grace Isaac, MD as Consulting Physician (Cardiothoracic Surgery) Donnie Mesa, MD as Consulting Physician (General Surgery) Thea Silversmith, MD (Inactive) as Consulting Physician (Radiation Oncology) Chauncey Cruel, MD as Consulting Physician (Oncology) Thea Silversmith, MD (Inactive) as Consulting Physician (Radiation Oncology) Curt Bears, MD as Consulting Physician (Oncology) PCP: Myriam Jacobson, MD GYN: OTHER MD:  CHIEF COMPLAINT: Lung cancer, breast cancer  CURRENT TREATMENT: Anastrozole   BREAST AND LUNG CANCER PRESENTING HISTORY:  LUNG CANCER From the earlier summary report:  Denise Mckenzie developed some cough early January 2017 and brought it to her primary care physician's attention. A chest x-ray was obtained 09/01/2014. This showed a vague lingular density measuring 1.4 cm. Further evaluation with a CT scan of the chest obtained 09/11/2015 found a 1.1 cm right upper lobe nodule. The area in the lingular density was felt to be scarring. There were other scattered nodules too small to characterize. There was no adenopathy and no bony lesions.  Accordingly on 10/11/2015 the patient underwent PET scan. The right upper lobe lung lesion was hypermetabolic, with an SUV max of 24.8. It measured 1.4 cm on the PET scan. At least for small pulmonary nodules were also again noted, the largest measuring 4 mm, all below PET resolution. There were felt to be likely benign. Incidentally a hypermetabolic 1 cm soft tissue nodule in the left breast was noted, with an SUV max of 4.1. A right adrenal mass measuring 2.5 cm was not hypermetabolic, consistent with a benign adenoma. CT scan of the head with and without contrast 10/22/2015 was benign  The patient's case was  presented at the lung cancer conference. Dr. Servando Snare comments that the patient's diffusion capacity was 28% and the FEV 1 was 47% of predicted, limiting surgical choices. Accordingly on 11/13/2015 the patient underwent video bronchoscopy with endobronchial navigation and right lung biopsy with marker placement The pathology from this procedure ([id number]) showed only blood clot and scant mucosal tissue. It was not diagnostic that it appears to be clearly discordant given the earlier PET results. The patient completed definitive surgery for the lung lesion on 01/21/2016  BREAST CANCER From the initial intake note:  The incidentally noted left breast mass was further evaluated with bilateral diagnostic mammography with tomography and [facility] 10/28/2015. This found a breast density to be category be. In the upper-outer quadrant there was a 1.0 cm spiculated mass which was not palpable. Ultrasonography confirmed a spiculated hypoechoic mass at the 2:00 position of the left breast, 3 cm from the nipple, measuring 1.0 cm. In the left axilla there was a 1.2 cm lymph node with a prominent cortex.  Biopsy of the left breast mass in question 11/04/2015 showed ([id number]) invasive ductal carcinoma, grade 2, estrogen receptor 100% positive, progesterone receptor 100% positive, both with strong staining intensity, with an MIB-1 of 6%, and no HER-2 amplification, the signals ratio being 1.32 and the number per cell 3.10. The suspicious left axillary lymph node was biopsied at the same time and it showed no evidence of carcinoma. This was felt to be concordant.  On 11/19/2015 Denise Mckenzie underwent left lumpectomy with sentinel lymph node sampling. The final pathology ([id number]) confirmed an invasive ductal carcinoma, grade 2, measuring 1.2 cm. Invasive disease was focally present  at the inferior margin. In situ disease was less than 0.1 cm to the inferior margin as well. All 4  sentinel lymph nodes were clear. Repeat HER-2 was again negative with a signals ratio of 1.43, the number per cell being 3.50.  Her subsequent history is as detailed below.  INTERVAL HISTORY: Denise Mckenzie returns today for follow-up of her estrogen receptor positive breast cancer in her lung cancer. Expressive breast cancer is concerned, she continues on anastrozole. She generally tolerates that well. Hot flashes and vaginal dryness are not major concerns. She obtains a drug at approximately $2 per month.  She is concerned about her bone density. She has been taking ibandronate for this. However caused more than $100 per month and she would like to make a change.  From the lung cancer point of view she was supposed to have had a CT scan of the chest in March but she says she was never call.  REVIEW OF SYSTEMS: Denise Mckenzie denies any unusual headaches, visual changes, nausea, or vomiting. She denies cough, phlegm production, or pleurisy. She is not exercising regularly because she is so busy helping care for her daughter-in-law has pancreatic cancer metastatic to the liver. A detailed review of systems today was otherwise stable.  Past Medical History:  Diagnosis Date  . Arthritis   . Cancer ([place])    lung cancer  . COPD (chronic obstructive pulmonary disease) ([place])   . Coronary artery disease   . Depression   . Diabetes mellitus ([place]) 10/22/2015  . Full dentures    full upper dentures, plate on bottom  . History of bronchitis   . History of pneumonia   . Hyperlipidemia   . Hypertension   . Hypothyroidism 10/22/2015  . Myocardial infarction July 1995  . Osteoporosis 10/22/2015  . Stress incontinence     PAST SURGICAL HISTORY: Past Surgical History:  Procedure Laterality Date  . ABDOMINAL HYSTERECTOMY     1968  . APPENDECTOMY     with hysterectomy  . [place]  . BREAST BIOPSY Left 11/04/2015   Malignant  . BREAST BIOPSY Left 11/04/2015   Malignant  . BREAST LUMPECTOMY Left  11/19/2015  . CARDIAC CATHETERIZATION  1995, 1996   Dr. Wynonia Lawman saw here then here at [facility] both times; 'they did a balloon and opened it up' first time, the 2nd was scheduled d/t abnormal stress test  . COLONOSCOPY W/ POLYPECTOMY    . CORONARY ARTERY BYPASS GRAFT     11/1994  . 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 LUNG BIOPSY N/A 11/13/2015   Procedure: RIGHT LUNG BIOPSY;  Surgeon: Grace Isaac, MD;  Location: [facility];  Service: Thoracic;  Laterality: N/A;  . MULTIPLE TOOTH EXTRACTIONS    . RADIOACTIVE SEED GUIDED MASTECTOMY WITH AXILLARY SENTINEL LYMPH NODE BIOPSY Left 11/19/2015   Procedure: RADIOACTIVE SEED GUIDED PARTIAL MASTECTOMY WITH AXILLARY SENTINEL LYMPH NODE BIOPSY;  Surgeon: Donnie Mesa, MD;  Location: [facility];  Service: General;  Laterality: Left;  . RE-EXCISION OF BREAST LUMPECTOMY Left 12/03/2015   Procedure: RE-EXCISION INFERIOR MARGIN OF LEFT BREAST LUMPECTOMY;  Surgeon: Donnie Mesa, MD;  Location: [facility];  Service: General;  Laterality: Left;  . TMJ ARTHROPLASTY     1979  . TONSILLECTOMY    . VIDEO BRONCHOSCOPY WITH ENDOBRONCHIAL NAVIGATION N/A 11/13/2015   Procedure: VIDEO BRONCHOSCOPY WITH ENDOBRONCHIAL NAVIGATION, with placement of fudicial markers;  Surgeon: Percell Miller  Maryruth Bun, MD;  Location: [facility];  Service: Thoracic;  Laterality: N/A;    FAMILY HISTORY Family History  Problem Relation Age of Onset  . Alzheimer's disease Mother   . Heart attack Father   The patient's father died from a heart attack at age 27. The patient's mother died with Alzheimer's disease at age 86. Denise Mckenzie has one brother and one sister. The only cancer in the family was breast cancer in a maternal aunt diagnosed in her early 44s.  GYNECOLOGIC HISTORY:  No LMP recorded. Patient has had a hysterectomy. Menarche age 14, first live birth age 58. The patient is GX P2. She underwent total abdominal hysterectomy with bilateral salpingo-oophorectomy  1968. She did not take hormone replacement.  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 Denise Mckenzie lives in [place]. The patient's daughter Denise Mckenzie lives in [place] and works in Programmer, applications. The patient has 3 grandchildren and 2 great-grandchildren. Her youngest grandchild graduated from high school June 2017. She attends a [employer] in [place]: In place   HEALTH MAINTENANCE: Social History  Substance Use Topics  . Smoking status: Former Smoker    Quit date: 08/17/1994  . Smokeless tobacco: Never Used  . Alcohol use No     Colonoscopy:2010/[facility]  PAP: 2015  Bone density: 2016/[facility]  Lipid panel:  No Known Allergies  Current Outpatient Prescriptions  Medication Sig Dispense Refill  . acetaminophen (TYLENOL) 500 MG tablet Take 1,000 mg by mouth 2 (two) times daily as needed (pain). Reported on 01/20/2016    . amitriptyline (ELAVIL) 50 MG tablet Take 50 mg by mouth at bedtime.      Marland Kitchen anastrozole (ARIMIDEX) 1 MG tablet Take 1 tablet (1 mg total) by mouth daily. 90 tablet 4  . aspirin EC 325 MG tablet Take 325 mg by mouth daily.    Marland Kitchen atorvastatin (LIPITOR) 40 MG tablet Take 40 mg by mouth at bedtime.     . Calcium Citrate-Vitamin D (CALCIUM + D PO) Take 2 tablets by mouth 3 (three) times daily. Each tablet Calcium 300 mg, Vitamin D3 250 I.U.    . ibandronate (BONIVA) 150 MG tablet Take 150 mg by mouth every 30 (thirty) days. On the 4th Monday of each month  4  . Krill Oil 1000 MG CAPS Take 1,000 mg by mouth daily.    Marland Kitchen levothyroxine (SYNTHROID, LEVOTHROID) 88 MCG tablet Take 88 mcg by mouth daily before breakfast.     . losartan-hydrochlorothiazide (HYZAAR) 100-12.5 MG per tablet Take 1 tablet by mouth daily.      . metFORMIN (GLUCOPHAGE) 500 MG tablet Take 500 mg by mouth daily.  4  . metoprolol (LOPRESSOR)  50 MG tablet Take 25 mg by mouth 2 (two) times daily. Reported on 01/28/2016    . nitroGLYCERIN (NITROSTAT) 0.4 MG SL tablet Place 0.4 mg under the tongue every 5 (five) minutes as needed for chest pain. Reported on 01/28/2016     No current facility-administered medications for this visit.     OBJECTIVE: Middle-aged white woman Who appears stated age  73:   12/08/16 1300  BP: 122/64  Pulse: 70  Resp: 17  Temp: 97.8 F (36.6 C)     Body mass index is 29.83 kg/m.    ECOG FS:0 - Asymptomatic  Sclerae unicteric, pupils round and equal Oropharynx  clear and moist No cervical or supraclavicular adenopathy Lungs no rales or rhonchi Heart regular rate and rhythm Abd soft, nontender, positive bowel sounds MSK no focal spinal tenderness, no upper extremity lymphedema Neuro: nonfocal, well oriented, appropriate affect Breasts: The right breast is benign. The left breast as undergone lumpectomy followed by radiation with no evidence of local recurrence. Both axillae are benign.  LAB RESULTS:  CMP     Component Value Date/Time   NA 132 (L) 12/01/2016 1251   K 4.8 12/01/2016 1251   CL 95 (L) 11/11/2015 1535   CO2 28 12/01/2016 1251   GLUCOSE 89 12/01/2016 1251   BUN 9.7 12/01/2016 1251   CREATININE 0.8 12/01/2016 1251   CALCIUM 9.6 12/01/2016 1251   PROT 7.3 12/01/2016 1251   ALBUMIN 3.5 12/01/2016 1251   AST 24 12/01/2016 1251   ALT 27 12/01/2016 1251   ALKPHOS 152 (H) 12/01/2016 1251   BILITOT 0.65 12/01/2016 1251   GFRNONAA >60 11/11/2015 1535   GFRAA >60 11/11/2015 1535    INo results found for: SPEP, UPEP  Lab Results  Component Value Date   WBC 8.4 12/01/2016   NEUTROABS 5.8 12/01/2016   HGB 14.4 12/01/2016   HCT 43.1 12/01/2016   MCV 86.9 12/01/2016   PLT 210 12/01/2016      Chemistry      Component Value Date/Time   NA 132 (L) 12/01/2016 1251   K 4.8 12/01/2016 1251   CL 95 (L) 11/11/2015 1535   CO2 28 12/01/2016 1251   BUN 9.7 12/01/2016 1251    CREATININE 0.8 12/01/2016 1251      Component Value Date/Time   CALCIUM 9.6 12/01/2016 1251   ALKPHOS 152 (H) 12/01/2016 1251   AST 24 12/01/2016 1251   ALT 27 12/01/2016 1251   BILITOT 0.65 12/01/2016 1251       No results found for: LABCA2  No components found for: LABCA125  No results for input(s): INR in the last 168 hours.  Urinalysis No results found for: COLORURINE, APPEARANCEUR, LABSPEC, PHURINE, GLUCOSEU, HGBUR, BILIRUBINUR, KETONESUR, PROTEINUR, UROBILINOGEN, NITRITE, LEUKOCYTESUR    ELIGIBLE FOR AVAILABLE RESEARCH PROTOCOL: No  STUDIES: Mammography 10/28/2016 found a breast density to be category be. There was no evidence of malignancy in either breast. Bone density on the same day found the T score to be -2.9.  ASSESSMENT: 71 y.o. [place] woman  (1) hypermetabolic right upper lobe lung mass noted on chest CT scan 09/11/2015, clinically T1a N0  (a) biopsy by video bronchoscopy 11/13/2015 nondiagnostic  (b) definitive stereotactic radiosurgery Completed 01/21/2016  (2) biopsy of a left upper outer quadrant breast mass 10/28/2015 showed a clinical T1c N0, stage IA invasive ductal carcinoma, grade 2, estrogen and progesterone strongly positive, HER-2 negative, with an MIB-1 of 6%.  (3) status post left lumpectomy and sentinel lymph node sampling 11/19/2015 for apT1c pN0, stage IA invasive ductal carcinoma, grade 2, repeat HER-2 again negative, with a focally positive inferior margin  (a) additional surgery for margin clearance  12/03/2015   (4) Adjuvant radiation:  01/08/16 - 01/30/16: Left Breast, 42.720 Gy in 16 fractions  01/14/16, 01/17/16, 01/21/16: Right Upper Lung, 54 Gy in 3 fractions  (5) started anastrozole 02/18/2016   (a) bone density 10/28/2016 shows a T score of -2.9.   (b) on ibandronate as of July 2017, switch to anastrozole April 2018 because of cost issues    PLAN: Denise Mckenzie is now one year out from definitive surgery for her breast cancer with no  evidence of clinical recurrence. This is favorable.  She is tolerating the anastrozole generally well. I think the problems she has in her hands, which is mostly in the morning, is likely to be carpal tunnel.  She does have osteoporosis. If she tolerates the ibandronate well but it is over $100 for 3 tablets. She would prefer to switch to alendronate and I was glad to facilitate that for her today. She knows how to take these medicines appropriately to avoid problems with reflux esophagitis  She was supposed to have had a chest CT scan in January or February. The order is fair but she says she was never called. I'm going ahead and getting a CT scan of the chest now and given the steady rise in her alkaline phosphatase I am also getting a bone scan. We will call her with those results.  Assuming those results do not show any evidence of recurrent or progressive disease she will see me again in 6 months  She knows to call for any problems that may develop before that visit.    Chauncey Cruel, MD   12/08/2016 1:01 PM Medical Oncology and Hematology [facility] [address] [place], [place] [address] Tel. [phone]    Fax. [phone]

## 2016-12-17 ENCOUNTER — Ambulatory Visit (HOSPITAL_COMMUNITY)
Admission: RE | Admit: 2016-12-17 | Discharge: 2016-12-17 | Disposition: A | Discharge status: Home / Self Care | Payer: Medicare Other | Source: Ambulatory Visit | Attending: Oncology | Admitting: Oncology

## 2016-12-17 DIAGNOSIS — R937 Abnormal findings on diagnostic imaging of other parts of musculoskeletal system: Secondary | ICD-10-CM | POA: Insufficient documentation

## 2016-12-17 DIAGNOSIS — J439 Emphysema, unspecified: Secondary | ICD-10-CM | POA: Insufficient documentation

## 2016-12-17 DIAGNOSIS — Z170 Estrogen receptor positive status [ER+]: Secondary | ICD-10-CM | POA: Diagnosis present

## 2016-12-17 DIAGNOSIS — C50412 Malignant neoplasm of upper-outer quadrant of left female breast: Secondary | ICD-10-CM | POA: Diagnosis present

## 2016-12-17 DIAGNOSIS — Z9889 Other specified postprocedural states: Secondary | ICD-10-CM | POA: Insufficient documentation

## 2016-12-17 DIAGNOSIS — I700 Atherosclerosis of aorta: Secondary | ICD-10-CM | POA: Diagnosis not present

## 2016-12-17 DIAGNOSIS — C3411 Malignant neoplasm of upper lobe, right bronchus or lung: Secondary | ICD-10-CM

## 2016-12-17 MED ORDER — IOPAMIDOL (ISOVUE-300) INJECTION 61%
75.0000 mL | Freq: Once | INTRAVENOUS | Status: AC | PRN
  Administered 2016-12-17: 75 mL via INTRAVENOUS

## 2016-12-17 MED ORDER — IOPAMIDOL (ISOVUE-300) INJECTION 61%
INTRAVENOUS | Status: AC
  Filled 2016-12-17: qty 75

## 2016-12-17 MED ORDER — TECHNETIUM TC 99M MEDRONATE IV KIT
20.6000 | PACK | Freq: Once | INTRAVENOUS | Status: AC | PRN
  Administered 2016-12-17: 20.6 via INTRAVENOUS

## 2016-12-21 ENCOUNTER — Other Ambulatory Visit: Payer: Self-pay

## 2016-12-21 DIAGNOSIS — R911 Solitary pulmonary nodule: Secondary | ICD-10-CM

## 2016-12-21 DIAGNOSIS — C50919 Malignant neoplasm of unspecified site of unspecified female breast: Secondary | ICD-10-CM

## 2016-12-22 ENCOUNTER — Other Ambulatory Visit: Payer: Self-pay

## 2016-12-22 ENCOUNTER — Telehealth: Payer: Self-pay

## 2016-12-22 ENCOUNTER — Other Ambulatory Visit: Payer: Self-pay | Admitting: Oncology

## 2016-12-22 DIAGNOSIS — C50919 Malignant neoplasm of unspecified site of unspecified female breast: Secondary | ICD-10-CM

## 2016-12-22 DIAGNOSIS — R911 Solitary pulmonary nodule: Secondary | ICD-10-CM

## 2016-12-22 NOTE — Telephone Encounter (Signed)
Appointment scheduled for pt at [facility] for mammogram and ultrasound.  Pt made aware of appt date and time, verbalizes understanding

## 2016-12-25 ENCOUNTER — Other Ambulatory Visit: Payer: Self-pay | Admitting: Oncology

## 2016-12-25 ENCOUNTER — Other Ambulatory Visit: Payer: Self-pay

## 2016-12-25 DIAGNOSIS — C50919 Malignant neoplasm of unspecified site of unspecified female breast: Secondary | ICD-10-CM

## 2016-12-25 DIAGNOSIS — N63 Unspecified lump in unspecified breast: Secondary | ICD-10-CM

## 2016-12-25 DIAGNOSIS — R911 Solitary pulmonary nodule: Secondary | ICD-10-CM

## 2016-12-28 ENCOUNTER — Other Ambulatory Visit: Payer: Self-pay | Admitting: Oncology

## 2016-12-28 ENCOUNTER — Ambulatory Visit
Admission: RE | Admit: 2016-12-28 | Discharge: 2016-12-28 | Disposition: A | Discharge status: Home / Self Care | Payer: Medicare Other | Source: Ambulatory Visit | Attending: Oncology | Admitting: Oncology

## 2016-12-28 ENCOUNTER — Telehealth: Payer: Self-pay | Admitting: Oncology

## 2016-12-28 DIAGNOSIS — C50919 Malignant neoplasm of unspecified site of unspecified female breast: Secondary | ICD-10-CM

## 2016-12-28 DIAGNOSIS — R911 Solitary pulmonary nodule: Secondary | ICD-10-CM

## 2016-12-28 DIAGNOSIS — N63 Unspecified lump in unspecified breast: Secondary | ICD-10-CM

## 2016-12-28 NOTE — Telephone Encounter (Signed)
lvm to inform pt of 5/21 appt at 1600 per LOS

## 2016-12-28 NOTE — Progress Notes (Unsigned)
Was called by Dr. Derrel Nip from the [facility] regarding Denise Mckenzie's planned mammography. He feels the bone scan was over read. He is reviewed the CT scans. He feels the area there is actually smaller than what were seeing on the bone scan is just going to be postoperative reaction. This is not all uncommon and he does not feel any imaging as needed. I think this is good news and the patient is very agreeable swelling mammogram was canceled

## 2017-01-04 ENCOUNTER — Ambulatory Visit (HOSPITAL_BASED_OUTPATIENT_CLINIC_OR_DEPARTMENT_OTHER): Payer: Medicare Other | Admitting: Oncology

## 2017-01-04 VITALS — BP 159/70 | HR 68 | Temp 98.1°F | Resp 19 | Ht 62.0 in | Wt 162.2 lb

## 2017-01-04 DIAGNOSIS — Z170 Estrogen receptor positive status [ER+]: Secondary | ICD-10-CM | POA: Diagnosis not present

## 2017-01-04 DIAGNOSIS — Z79811 Long term (current) use of aromatase inhibitors: Secondary | ICD-10-CM | POA: Diagnosis not present

## 2017-01-04 DIAGNOSIS — M810 Age-related osteoporosis without current pathological fracture: Secondary | ICD-10-CM

## 2017-01-04 DIAGNOSIS — C3411 Malignant neoplasm of upper lobe, right bronchus or lung: Secondary | ICD-10-CM

## 2017-01-04 DIAGNOSIS — C50412 Malignant neoplasm of upper-outer quadrant of left female breast: Secondary | ICD-10-CM

## 2017-01-04 NOTE — Progress Notes (Signed)
[facility]  [phone] Fax:([phone]) [phone]     ID: Denise Mckenzie DOB: [id number]  MR#: [id number]  WPV#:[id number]  Patient Care Team: Lorene Dy, MD as PCP - General (Internal Medicine) Grace Isaac, MD as Consulting Physician (Cardiothoracic Surgery) Donnie Mesa, MD as Consulting Physician (General Surgery) Thea Silversmith, MD (Inactive) as Consulting Physician (Radiation Oncology) Magrinat, Virgie Dad, MD as Consulting Physician (Oncology) Thea Silversmith, MD (Inactive) as Consulting Physician (Radiation Oncology) Curt Bears, MD as Consulting Physician (Oncology) PCP: Lorene Dy, MD GYN: OTHER MD:  CHIEF COMPLAINT: Lung cancer, breast cancer  CURRENT TREATMENT: Anastrozole, alendronate  INTERVAL HISTORY: [name] returns today for follow-up of her estrogen receptor positive breast cancer as well as her lung cancer. She continues on anastrozole, with good tolerance. Hot flashes are not a major issue. Vaginal dryness is minimal. She uses some lubricants in that regard and she is currently not interested in participating in our intimacy and pelvic health program. She obtains a drug in approximately $2 per month.  She is also now on alendronate. She knows how to take bisphosphonates without irritating her esophagus. She has had no problems from this medications in the past.  We just restage her with a CT scan of the chest and a bone scan since there had been a bump in her alkaline phosphatase. There is no evidence of disease recurrence as far as breast cancer is concerned, the bone scan is generally negative, and there was a question regarding increased uptake in the surgical breast which was discussed with radiology in the mammography group. They felt this was a very common finding and required no further evaluation.  REVIEW OF SYSTEM: [name] is very engaged in the care of her daughter-in-law who is pancreatic cancer. This takes a lot of  her time. She does not exercise regularly. She does do some yard work and of course all her housework. She denies any unusual cough, phlegm production, pleurisy, or shortness of breath. Aside from these issues a detailed review of systems today was noncontributory  BREAST AND LUNG CANCER PRESENTING HISTORY:  LUNG CANCER From the earlier summary report:  [name] developed some cough early January 2017 and brought it to her primary care physician's attention. A chest x-ray was obtained 09/01/2014. This showed a vague lingular density measuring 1.4 cm. Further evaluation with a CT scan of the chest obtained 09/11/2015 found a 1.1 cm right upper lobe nodule. The area in the lingular density was felt to be scarring. There were other scattered nodules too small to characterize. There was no adenopathy and no bony lesions.  Accordingly on 10/11/2015 the patient underwent PET scan. The right upper lobe lung lesion was hypermetabolic, with an SUV max of 24.8. It measured 1.4 cm on the PET scan. At least for small pulmonary nodules were also again noted, the largest measuring 4 mm, all below PET resolution. There were felt to be likely benign. Incidentally a hypermetabolic 1 cm soft tissue nodule in the left breast was noted, with an SUV max of 4.1. A right adrenal mass measuring 2.5 cm was not hypermetabolic, consistent with a benign adenoma. CT scan of the head with and without contrast 10/22/2015 was benign  The patient's case was presented at the lung cancer conference. Dr. Servando Snare comments that the patient's diffusion capacity was 28% and the FEV 1 was 47% of predicted, limiting surgical choices. Accordingly on 11/13/2015 the patient underwent video bronchoscopy with endobronchial navigation and right lung biopsy with marker placement The  pathology from this procedure ([id number]) showed only blood clot and scant mucosal tissue. It was not diagnostic that it appears to be clearly discordant given the earlier  PET results. The patient completed definitive surgery for the lung lesion on 01/21/2016  BREAST CANCER From the initial intake note:  The incidentally noted left breast mass was further evaluated with bilateral diagnostic mammography with tomography and [facility] 10/28/2015. This found a breast density to be category be. In the upper-outer quadrant there was a 1.0 cm spiculated mass which was not palpable. Ultrasonography confirmed a spiculated hypoechoic mass at the 2:00 position of the left breast, 3 cm from the nipple, measuring 1.0 cm. In the left axilla there was a 1.2 cm lymph node with a prominent cortex.  Biopsy of the left breast mass in question 11/04/2015 showed ([id number]) invasive ductal carcinoma, grade 2, estrogen receptor 100% positive, progesterone receptor 100% positive, both with strong staining intensity, with an MIB-1 of 6%, and no HER-2 amplification, the signals ratio being 1.32 and the number per cell 3.10. The suspicious left axillary lymph node was biopsied at the same time and it showed no evidence of carcinoma. This was felt to be concordant.  On 11/19/2015 Izora Gala underwent left lumpectomy with sentinel lymph node sampling. The final pathology ([id number]) confirmed an invasive ductal carcinoma, grade 2, measuring 1.2 cm. Invasive disease was focally present at the inferior margin. In situ disease was less than 0.1 cm to the inferior margin as well. All 4 sentinel lymph nodes were clear. Repeat HER-2 was again negative with a signals ratio of 1.43, the number per cell being 3.50.  Her subsequent history is as detailed below.    Past Medical History:  Diagnosis Date  . Arthritis   . Cancer ([facility])    lung cancer  . COPD (chronic obstructive pulmonary disease) ([facility])   . Coronary artery disease   . Depression   . Diabetes mellitus ([facility]) 10/22/2015  . Full dentures    full upper dentures, plate on bottom  . History of bronchitis     . History of pneumonia   . Hyperlipidemia   . Hypertension   . Hypothyroidism 10/22/2015  . Myocardial infarction July 1995  . Osteoporosis 10/22/2015  . Stress incontinence     PAST SURGICAL HISTORY: Past Surgical History:  Procedure Laterality Date  . ABDOMINAL HYSTERECTOMY     1968  . APPENDECTOMY     with hysterectomy  . [place]  . BREAST BIOPSY Left 11/04/2015   Malignant  . BREAST BIOPSY Left 11/04/2015   Malignant  . BREAST LUMPECTOMY Left 11/19/2015  . CARDIAC CATHETERIZATION  1995, 1996   Dr. Wynonia Lawman saw here then here at [facility] both times; 'they did a balloon and opened it up' first time, the 2nd was scheduled d/t abnormal stress test  . COLONOSCOPY W/ POLYPECTOMY    . CORONARY ARTERY BYPASS GRAFT     11/1994  . 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 LUNG BIOPSY N/A 11/13/2015   Procedure: RIGHT LUNG BIOPSY;  Surgeon: Grace Isaac, MD;  Location: [facility];  Service: Thoracic;  Laterality: N/A;  . MULTIPLE TOOTH EXTRACTIONS    . RADIOACTIVE SEED GUIDED MASTECTOMY WITH AXILLARY SENTINEL LYMPH NODE BIOPSY Left 11/19/2015   Procedure: RADIOACTIVE SEED GUIDED PARTIAL MASTECTOMY WITH AXILLARY SENTINEL LYMPH NODE BIOPSY;  Surgeon: Donnie Mesa, MD;  Location: [facility];  Service: General;  Laterality: Left;  . RE-EXCISION OF BREAST LUMPECTOMY Left 12/03/2015   Procedure: RE-EXCISION INFERIOR MARGIN OF LEFT BREAST LUMPECTOMY;  Surgeon: Donnie Mesa, MD;  Location: [facility];  Service: General;  Laterality: Left;  . TMJ ARTHROPLASTY     1979  . TONSILLECTOMY    . VIDEO BRONCHOSCOPY WITH ENDOBRONCHIAL NAVIGATION N/A 11/13/2015   Procedure: VIDEO BRONCHOSCOPY WITH ENDOBRONCHIAL NAVIGATION, with placement of fudicial markers;  Surgeon: Grace Isaac, MD;  Location: [facility] OR;  Service: Thoracic;  Laterality: N/A;    FAMILY HISTORY Family History  Problem Relation Age of Onset  . Alzheimer's disease Mother   . Heart  attack Father   The patient's father died from a heart attack at age 21. The patient's mother died with Alzheimer's disease at age 41. Lonia has one brother and one sister. The only cancer in the family was breast cancer in a maternal aunt diagnosed in her early 15s.  GYNECOLOGIC HISTORY:  No LMP recorded. Patient has had a hysterectomy. Menarche age 51, first live birth age 9. The patient is GX P2. She underwent total abdominal hysterectomy with bilateral salpingo-oophorectomy 1968. She did not take hormone replacement.  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 Marya Amsler lives in [place]. The patient's daughter Sherri Rad lives in [place] and works in Programmer, applications. The patient has 3 grandchildren and 2 great-grandchildren. Her youngest grandchild graduated from high school June 2017. She attends a [employer] in [place]: In place   HEALTH MAINTENANCE: Social History  Substance Use Topics  . Smoking status: Former Smoker    Quit date: 08/17/1994  . Smokeless tobacco: Never Used  . Alcohol use No     Colonoscopy:2010/[facility]  PAP: 2015  Bone density: 2016/[facility]  Lipid panel:  No Known Allergies  Current Outpatient Prescriptions  Medication Sig Dispense Refill  . acetaminophen (TYLENOL) 500 MG tablet Take 1,000 mg by mouth 2 (two) times daily as needed (pain). Reported on 01/20/2016    . amitriptyline (ELAVIL) 50 MG tablet Take 50 mg by mouth at bedtime.      Marland Kitchen anastrozole (ARIMIDEX) 1 MG tablet Take 1 tablet (1 mg total) by mouth daily. 90 tablet 4  . aspirin EC 325 MG tablet Take 325 mg by mouth daily.    Marland Kitchen atorvastatin (LIPITOR) 40 MG tablet Take 40 mg by mouth at bedtime.     . Calcium Citrate-Vitamin D (CALCIUM + D PO) Take 2 tablets by mouth 3 (three) times daily. Each tablet Calcium 300 mg, Vitamin D3 250  I.U.    . ibandronate (BONIVA) 150 MG tablet Take 150 mg by mouth every 30 (thirty) days. On the 4th Monday of each month  4  . Krill Oil 1000 MG CAPS Take 1,000 mg by mouth daily.    Marland Kitchen levothyroxine (SYNTHROID, LEVOTHROID) 88 MCG tablet Take 88 mcg by mouth daily before breakfast.     . losartan-hydrochlorothiazide (HYZAAR) 100-12.5 MG per tablet Take 1 tablet by mouth daily.      . metFORMIN (GLUCOPHAGE) 500 MG tablet Take 500 mg by mouth daily.  4  . metoprolol (LOPRESSOR) 50 MG tablet Take 25 mg by mouth 2 (two) times daily. Reported on 01/28/2016    . nitroGLYCERIN (NITROSTAT) 0.4 MG SL tablet Place 0.4 mg under  the tongue every 5 (five) minutes as needed for chest pain. Reported on 01/28/2016     No current facility-administered medications for this visit.     OBJECTIVE: Middle-aged white woman In no acute distress  Vitals:   01/04/17 1554  BP: (!) 159/70  Pulse: 68  Resp: 19  Temp: 98.1 F (36.7 C)     Body mass index is 29.67 kg/m.    ECOG FS:0 - Asymptomatic   Sclerae unicteric, EOMs intact Oropharynx clear and moist No cervical or supraclavicular adenopathy Lungs no rales or rhonchi Heart regular rate and rhythm Abd soft, nontender, positive bowel sounds MSK no focal spinal tenderness, no upper extremity lymphedema Neuro: nonfocal, well oriented, appropriate affect Breasts: The right breast is unremarkable. The left breast is status post lumpectomy and radiation. There is no evidence of local recurrence. Both axillae are benign.  LAB RESULTS:  CMP     Component Value Date/Time   NA 132 (L) 12/01/2016 1251   K 4.8 12/01/2016 1251   CL 95 (L) 11/11/2015 1535   CO2 28 12/01/2016 1251   GLUCOSE 89 12/01/2016 1251   BUN 9.7 12/01/2016 1251   CREATININE 0.8 12/01/2016 1251   CALCIUM 9.6 12/01/2016 1251   PROT 7.3 12/01/2016 1251   ALBUMIN 3.5 12/01/2016 1251   AST 24 12/01/2016 1251   ALT 27 12/01/2016 1251   ALKPHOS 152 (H) 12/01/2016 1251   BILITOT 0.65  12/01/2016 1251   GFRNONAA >60 11/11/2015 1535   GFRAA >60 11/11/2015 1535    INo results found for: SPEP, UPEP  Lab Results  Component Value Date   WBC 8.4 12/01/2016   NEUTROABS 5.8 12/01/2016   HGB 14.4 12/01/2016   HCT 43.1 12/01/2016   MCV 86.9 12/01/2016   PLT 210 12/01/2016      Chemistry      Component Value Date/Time   NA 132 (L) 12/01/2016 1251   K 4.8 12/01/2016 1251   CL 95 (L) 11/11/2015 1535   CO2 28 12/01/2016 1251   BUN 9.7 12/01/2016 1251   CREATININE 0.8 12/01/2016 1251      Component Value Date/Time   CALCIUM 9.6 12/01/2016 1251   ALKPHOS 152 (H) 12/01/2016 1251   AST 24 12/01/2016 1251   ALT 27 12/01/2016 1251   BILITOT 0.65 12/01/2016 1251       No results found for: LABCA2  No components found for: LABCA125  No results for input(s): INR in the last 168 hours.  Urinalysis No results found for: COLORURINE, APPEARANCEUR, LABSPEC, PHURINE, GLUCOSEU, HGBUR, BILIRUBINUR, KETONESUR, PROTEINUR, UROBILINOGEN, NITRITE, LEUKOCYTESUR    ELIGIBLE FOR AVAILABLE RESEARCH PROTOCOL: No  STUDIES: Mammography 10/28/2016 found a breast density to be category B There was no evidence of malignancy in either breast. Bone density on the same day found the T score to be -2.9.  ASSESSMENT: 71 y.o. [place] woman  (1) hypermetabolic right upper lobe lung mass noted on chest CT scan 09/11/2015, clinically T1a N0  (a) biopsy by video bronchoscopy 11/13/2015 nondiagnostic  (b) definitive stereotactic radiosurgery Completed 01/21/2016  (2) biopsy of a left upper outer quadrant breast mass 10/28/2015 showed a clinical T1c N0, stage IA invasive ductal carcinoma, grade 2, estrogen and progesterone strongly positive, HER-2 negative, with an MIB-1 of 6%.  (3) status post left lumpectomy and sentinel lymph node sampling 11/19/2015 for apT1c pN0, stage IA invasive ductal carcinoma, grade 2, repeat HER-2 again negative, with a focally positive inferior margin  (a)  additional surgery for margin clearance  12/03/2015   (4) Adjuvant radiation:  01/08/16 - 01/30/16: Left Breast, 42.720 Gy in 16 fractions  01/14/16, 01/17/16, 01/21/16: Right Upper Lung, 54 Gy in 3 fractions  (5) started anastrozole 02/18/2016   (a) bone density 10/28/2016 shows a T score of -2.9.   (b) on ibandronate as of July 2017, switch to alendronate April 2018 because of cost issues    PLAN: I spent a little more than 30 minutes today with Denise Mckenzie going over her dual cancers.   Shanee is now a little over one year out from definitive surgery for her breast cancer with no evidence of disease recurrence. This is very favorable.  He tolerates anastrozole well and the plan will be to continue that for a total of 5 years.  As far as her bone density is concerned she will continue on alendronate while on anastrozole. So far she has had no side effects from that medication. She will be due for repeat bone density March 2020  It is gratifying to see that her lung mass, status post stereotactic radiosurgery a year ago, continues to shrink. There is no evidence of further progression in the lung  At this point I'm comfortable seeing her on an every six-month basis. We will do physical exam a lab work at that time. We will consider repeating a chest x-ray then.  She knows to call for any problems that may develop before her next visit here.    Chauncey Cruel, MD   01/04/2017 9:07 PM Medical Oncology and Hematology [facility] [address] [place], [place] [address] Tel. [phone]    Fax. [phone]

## 2017-01-06 ENCOUNTER — Other Ambulatory Visit: Payer: Self-pay | Admitting: *Deleted

## 2017-01-07 ENCOUNTER — Other Ambulatory Visit: Payer: Self-pay | Admitting: *Deleted

## 2017-01-07 MED ORDER — ALENDRONATE SODIUM 70 MG PO TABS: 70.0000 mg | ORAL_TABLET | ORAL | 3 refills | Status: DC

## 2017-02-10 ENCOUNTER — Other Ambulatory Visit: Payer: Self-pay | Admitting: *Deleted

## 2017-02-10 MED ORDER — ANASTROZOLE 1 MG PO TABS: 1.0000 mg | ORAL_TABLET | Freq: Every day | ORAL | 0 refills | Status: DC

## 2017-02-16 ENCOUNTER — Other Ambulatory Visit: Payer: Self-pay | Admitting: *Deleted

## 2017-02-16 MED ORDER — ANASTROZOLE 1 MG PO TABS: 1.0000 mg | ORAL_TABLET | Freq: Every day | ORAL | 3 refills | Status: DC

## 2017-06-01 ENCOUNTER — Other Ambulatory Visit: Payer: Medicare Other

## 2017-06-08 ENCOUNTER — Ambulatory Visit: Payer: Medicare Other | Admitting: Oncology

## 2017-07-06 ENCOUNTER — Ambulatory Visit: Payer: Medicare Other | Admitting: Oncology

## 2017-07-06 ENCOUNTER — Other Ambulatory Visit: Payer: Medicare Other

## 2017-08-09 ENCOUNTER — Telehealth: Payer: Self-pay | Admitting: *Deleted

## 2017-08-09 ENCOUNTER — Other Ambulatory Visit: Payer: Self-pay | Admitting: Radiation Oncology

## 2017-08-09 DIAGNOSIS — C3411 Malignant neoplasm of upper lobe, right bronchus or lung: Secondary | ICD-10-CM

## 2017-08-09 NOTE — Telephone Encounter (Signed)
CALLED PATIENT TO INFORM OF CT FOR 08-18-17- ARRIVAL TIME 12:15 PM @ [facility], PT. TO BE NPO- 4 HRS. PRIOR TO TEST, SPOKE WITH PATIENT AND SHE IS AWARE OF THIS TEST.

## 2017-08-18 ENCOUNTER — Ambulatory Visit (HOSPITAL_COMMUNITY)
Admission: RE | Admit: 2017-08-18 | Discharge: 2017-08-18 | Disposition: A | Discharge status: Home / Self Care | Payer: Medicare Other | Source: Ambulatory Visit | Attending: Radiation Oncology | Admitting: Radiation Oncology

## 2017-08-18 ENCOUNTER — Ambulatory Visit
Admission: RE | Admit: 2017-08-18 | Discharge: 2017-08-18 | Disposition: A | Discharge status: Home / Self Care | Payer: Medicare Other | Source: Ambulatory Visit | Attending: Radiation Oncology | Admitting: Radiation Oncology

## 2017-08-18 DIAGNOSIS — C3411 Malignant neoplasm of upper lobe, right bronchus or lung: Secondary | ICD-10-CM

## 2017-08-18 DIAGNOSIS — I700 Atherosclerosis of aorta: Secondary | ICD-10-CM | POA: Insufficient documentation

## 2017-08-18 DIAGNOSIS — R918 Other nonspecific abnormal finding of lung field: Secondary | ICD-10-CM | POA: Insufficient documentation

## 2017-08-18 DIAGNOSIS — J438 Other emphysema: Secondary | ICD-10-CM | POA: Insufficient documentation

## 2017-08-18 DIAGNOSIS — I2510 Atherosclerotic heart disease of native coronary artery without angina pectoris: Secondary | ICD-10-CM | POA: Diagnosis not present

## 2017-08-18 DIAGNOSIS — J432 Centrilobular emphysema: Secondary | ICD-10-CM | POA: Diagnosis not present

## 2017-08-18 LAB — BUN AND CREATININE (CC13)
BUN: 11.9 mg/dL (ref 7.0–26.0)
CREATININE: 0.8 mg/dL (ref 0.6–1.1)

## 2017-08-18 MED ORDER — IOPAMIDOL (ISOVUE-300) INJECTION 61%
75.0000 mL | Freq: Once | INTRAVENOUS | Status: AC | PRN
  Administered 2017-08-18: 75 mL via INTRAVENOUS

## 2017-08-18 MED ORDER — IOPAMIDOL (ISOVUE-300) INJECTION 61%
INTRAVENOUS | Status: AC
  Filled 2017-08-18: qty 75

## 2017-08-19 ENCOUNTER — Inpatient Hospital Stay: Payer: Medicare Other | Attending: Oncology | Admitting: Oncology

## 2017-08-19 ENCOUNTER — Telehealth: Payer: Self-pay | Admitting: Radiation Oncology

## 2017-08-19 ENCOUNTER — Other Ambulatory Visit (HOSPITAL_BASED_OUTPATIENT_CLINIC_OR_DEPARTMENT_OTHER): Payer: Medicare Other

## 2017-08-19 VITALS — BP 130/67 | HR 68 | Temp 98.6°F | Resp 20 | Wt 164.4 lb

## 2017-08-19 DIAGNOSIS — Z170 Estrogen receptor positive status [ER+]: Secondary | ICD-10-CM

## 2017-08-19 DIAGNOSIS — K760 Fatty (change of) liver, not elsewhere classified: Secondary | ICD-10-CM | POA: Insufficient documentation

## 2017-08-19 DIAGNOSIS — Z85118 Personal history of other malignant neoplasm of bronchus and lung: Secondary | ICD-10-CM

## 2017-08-19 DIAGNOSIS — C50412 Malignant neoplasm of upper-outer quadrant of left female breast: Secondary | ICD-10-CM

## 2017-08-19 DIAGNOSIS — C3411 Malignant neoplasm of upper lobe, right bronchus or lung: Secondary | ICD-10-CM

## 2017-08-19 DIAGNOSIS — D3501 Benign neoplasm of right adrenal gland: Secondary | ICD-10-CM

## 2017-08-19 DIAGNOSIS — E119 Type 2 diabetes mellitus without complications: Secondary | ICD-10-CM | POA: Diagnosis not present

## 2017-08-19 DIAGNOSIS — Z79811 Long term (current) use of aromatase inhibitors: Secondary | ICD-10-CM | POA: Diagnosis not present

## 2017-08-19 DIAGNOSIS — I10 Essential (primary) hypertension: Secondary | ICD-10-CM | POA: Diagnosis not present

## 2017-08-19 DIAGNOSIS — M810 Age-related osteoporosis without current pathological fracture: Secondary | ICD-10-CM

## 2017-08-19 DIAGNOSIS — D3502 Benign neoplasm of left adrenal gland: Secondary | ICD-10-CM

## 2017-08-19 LAB — CBC WITH DIFFERENTIAL/PLATELET
BASO%: 1.4 % (ref 0.0–2.0)
Basophils Absolute: 0.1 10*3/uL (ref 0.0–0.1)
EOS ABS: 0.3 10*3/uL (ref 0.0–0.5)
EOS%: 3.7 % (ref 0.0–7.0)
HEMATOCRIT: 43 % (ref 34.8–46.6)
HEMOGLOBIN: 14.1 g/dL (ref 11.6–15.9)
LYMPH#: 1.4 10*3/uL (ref 0.9–3.3)
LYMPH%: 18.6 % (ref 14.0–49.7)
MCH: 29 pg (ref 25.1–34.0)
MCHC: 32.7 g/dL (ref 31.5–36.0)
MCV: 88.8 fL (ref 79.5–101.0)
MONO#: 0.7 10*3/uL (ref 0.1–0.9)
MONO%: 9 % (ref 0.0–14.0)
NEUT#: 5.1 10*3/uL (ref 1.5–6.5)
NEUT%: 67.3 % (ref 38.4–76.8)
PLATELETS: 194 10*3/uL (ref 145–400)
RBC: 4.85 10*6/uL (ref 3.70–5.45)
RDW: 14.9 % — ABNORMAL HIGH (ref 11.2–14.5)
WBC: 7.6 10*3/uL (ref 3.9–10.3)

## 2017-08-19 LAB — COMPREHENSIVE METABOLIC PANEL
ALBUMIN: 3.4 g/dL — AB (ref 3.5–5.0)
ALK PHOS: 112 U/L (ref 40–150)
ALT: 14 U/L (ref 0–55)
AST: 18 U/L (ref 5–34)
Anion Gap: 7 mEq/L (ref 3–11)
BUN: 14.7 mg/dL (ref 7.0–26.0)
CALCIUM: 9.2 mg/dL (ref 8.4–10.4)
CHLORIDE: 99 meq/L (ref 98–109)
CO2: 30 mEq/L — ABNORMAL HIGH (ref 22–29)
Creatinine: 1 mg/dL (ref 0.6–1.1)
EGFR: 56 mL/min/{1.73_m2} — AB (ref >60)
Glucose: 91 mg/dl (ref 70–140)
POTASSIUM: 4.5 meq/L (ref 3.5–5.1)
Sodium: 136 mEq/L (ref 136–145)
Total Bilirubin: 0.41 mg/dL (ref 0.20–1.20)
Total Protein: 6.8 g/dL (ref 6.4–8.3)

## 2017-08-19 NOTE — Telephone Encounter (Signed)
I called the patient to review her scan results. I would be happy to follow her now at 6 month intervals with repeat imaging per NCCN guidelines. Dr. Jana Hakim may prefer to follow her for her putative lung cancer diagnosis as well, and I let her know that whatever each of their preferences were I'm happy to follow suit.

## 2017-08-19 NOTE — Progress Notes (Signed)
[place]  Telephone:([phone]) [phone] Fax:(336) [phone]     ID: Denise Mckenzie DOB: May 13, 1946  MR#: [id number]  XLK#:[id number]  Patient Care Team: Lorene Dy, MD as PCP - General (Internal Medicine) Grace Isaac, MD as Consulting Physician (Cardiothoracic Surgery) Donnie Mesa, MD as Consulting Physician (General Surgery) Thea Silversmith, MD (Inactive) as Consulting Physician (Radiation Oncology) Manu Rubey, Virgie Dad, MD as Consulting Physician (Oncology) Thea Silversmith, MD (Inactive) as Consulting Physician (Radiation Oncology) Curt Bears, MD as Consulting Physician (Oncology) PCP: Lorene Dy, MD GYN: OTHER MD:  CHIEF COMPLAINT: Lung cancer, breast cancer  CURRENT TREATMENT: Anastrozole, alendronate  INTERVAL HISTORY: Denise Mckenzie returns today for a follow-up and treatment of her estrogen receptor positive breast cancer as well as follow-up of her lung cancer. She is accompanied by her daughter, Denise Mckenzie.  As far as her lung cancer is concerned she was just restaged with a chest CT scan which shows no evidence of disease recurrence or disease activity.  Course is also shows no evidence of breast cancer  As far as her breast cancer is concerned, she continues on anastrozole, which she tolerates well. She states that she had hot flashes, but they have improved and have almost completely subsided. She also experiences some arthralgias and myalgias. Denise Mckenzie denies vaginal dryness or discharge. She is able to obtain at a reasonable price.  REVIEW OF SYSTEM: Denise Mckenzie is doing well overall. She has been staying busy the last few weeks because of the holidays. She endorses right shoulder pain and adds that she had a coristone shot with mild to no relief. She takes at most 2 tylenol for pain with some relief. At times, when the pain is really bad she had been approved to take two Excedrin with plenty of water. She denies unusual headaches, visual changes, nausea,  vomiting, or dizziness. There has been no unusual cough, phlegm production, or pleurisy. This been no change in bowel or bladder habits. She denies unexplained fatigue or unexplained weight loss, bleeding, rash, or fever. A detailed review of systems was otherwise entirely stable.   BREAST AND LUNG CANCER PRESENTING HISTORY:  LUNG CANCER From the earlier summary report:  Denise Mckenzie developed some cough early January 2017 and brought it to her primary care physician's attention. A chest x-ray was obtained 09/01/2014. This showed a vague lingular density measuring 1.4 cm. Further evaluation with a CT scan of the chest obtained 09/11/2015 found a 1.1 cm right upper lobe nodule. The area in the lingular density was felt to be scarring. There were other scattered nodules too small to characterize. There was no adenopathy and no bony lesions.  Accordingly on 10/11/2015 the patient underwent PET scan. The right upper lobe lung lesion was hypermetabolic, with an SUV max of 24.8. It measured 1.4 cm on the PET scan. At least for small pulmonary nodules were also again noted, the largest measuring 4 mm, all below PET resolution. There were felt to be likely benign. Incidentally a hypermetabolic 1 cm soft tissue nodule in the left breast was noted, with an SUV max of 4.1. A right adrenal mass measuring 2.5 cm was not hypermetabolic, consistent with a benign adenoma. CT scan of the head with and without contrast 10/22/2015 was benign  The patient's case was presented at the lung cancer conference. Dr. Servando Snare comments that the patient's diffusion capacity was 28% and the FEV 1 was 47% of predicted, limiting surgical choices. Accordingly on 11/13/2015 the patient underwent video bronchoscopy with endobronchial navigation and right lung biopsy  with marker placement The pathology from this procedure ([id number]) showed only blood clot and scant mucosal tissue. It was not diagnostic that it appears to be clearly discordant  given the earlier PET results. The patient completed definitive surgery for the lung lesion on 01/21/2016  BREAST CANCER From the initial intake note:  The incidentally noted left breast mass was further evaluated with bilateral diagnostic mammography with tomography and [facility] 10/28/2015. This found a breast density to be category be. In the upper-outer quadrant there was a 1.0 cm spiculated mass which was not palpable. Ultrasonography confirmed a spiculated hypoechoic mass at the 2:00 position of the left breast, 3 cm from the nipple, measuring 1.0 cm. In the left axilla there was a 1.2 cm lymph node with a prominent cortex.  Biopsy of the left breast mass in question 11/04/2015 showed ([id number]) invasive ductal carcinoma, grade 2, estrogen receptor 100% positive, progesterone receptor 100% positive, both with strong staining intensity, with an MIB-1 of 6%, and no HER-2 amplification, the signals ratio being 1.32 and the number per cell 3.10. The suspicious left axillary lymph node was biopsied at the same time and it showed no evidence of carcinoma. This was felt to be concordant.  On 11/19/2015 Denise Mckenzie underwent left lumpectomy with sentinel lymph node sampling. The final pathology ([id number]) confirmed an invasive ductal carcinoma, grade 2, measuring 1.2 cm. Invasive disease was focally present at the inferior margin. In situ disease was less than 0.1 cm to the inferior margin as well. All 4 sentinel lymph nodes were clear. Repeat HER-2 was again negative with a signals ratio of 1.43, the number per cell being 3.50.  Her subsequent history is as detailed below.    Past Medical History:  Diagnosis Date  . Arthritis   . Cancer ([place])    lung cancer  . COPD (chronic obstructive pulmonary disease) ([place])   . Coronary artery disease   . Depression   . Diabetes mellitus ([place]) 10/22/2015  . Full dentures    full upper dentures, plate on bottom  .  History of bronchitis   . History of pneumonia   . Hyperlipidemia   . Hypertension   . Hypothyroidism 10/22/2015  . Myocardial infarction July 1995  . Osteoporosis 10/22/2015  . Stress incontinence     PAST SURGICAL HISTORY: Past Surgical History:  Procedure Laterality Date  . ABDOMINAL HYSTERECTOMY     1968  . APPENDECTOMY     with hysterectomy  . [place]  . BREAST BIOPSY Left 11/04/2015   Malignant  . BREAST BIOPSY Left 11/04/2015   Malignant  . BREAST LUMPECTOMY Left 11/19/2015  . CARDIAC CATHETERIZATION  1995, 1996   Dr. Wynonia Lawman saw here then [facility] times; 'they did a balloon and opened it up' first time, the 2nd was scheduled d/t abnormal stress test  . COLONOSCOPY W/ POLYPECTOMY    . CORONARY ARTERY BYPASS GRAFT     11/1994  . 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 LUNG BIOPSY N/A 11/13/2015   Procedure: RIGHT LUNG BIOPSY;  Surgeon: Grace Isaac, MD;  Location: [facility];  Service: Thoracic;  Laterality: N/A;  . MULTIPLE TOOTH EXTRACTIONS    . RADIOACTIVE SEED GUIDED PARTIAL MASTECTOMY WITH AXILLARY SENTINEL LYMPH NODE BIOPSY Left 11/19/2015   Procedure: RADIOACTIVE SEED GUIDED PARTIAL MASTECTOMY WITH AXILLARY SENTINEL LYMPH NODE BIOPSY;  Surgeon: Donnie Mesa, MD;  Location: [facility];  Service: General;  Laterality: Left;  . RE-EXCISION OF BREAST LUMPECTOMY Left 12/03/2015   Procedure: RE-EXCISION INFERIOR MARGIN OF LEFT BREAST LUMPECTOMY;  Surgeon: Donnie Mesa, MD;  Location: [facility];  Service: General;  Laterality: Left;  . TMJ ARTHROPLASTY     1979  . TONSILLECTOMY    . VIDEO BRONCHOSCOPY WITH ENDOBRONCHIAL NAVIGATION N/A 11/13/2015   Procedure: VIDEO BRONCHOSCOPY WITH ENDOBRONCHIAL NAVIGATION, with placement of fudicial markers;  Surgeon: Grace Isaac, MD;  Location[facility];  Service: Thoracic;  Laterality: N/A;    FAMILY HISTORY Family History  Problem Relation Age of Onset  .  Alzheimer's disease Mother   . Heart attack Father   The patient's father died from a heart attack at age 63. The patient's mother died with Alzheimer's disease at age 53. Denise Mckenzie has one brother and one sister. The only cancer in the family was breast cancer in a maternal aunt diagnosed in her early 77s.  GYNECOLOGIC HISTORY:  No LMP recorded. Patient has had a hysterectomy. Menarche age 58, first live birth age 83. The patient is GX P2. She underwent total abdominal hysterectomy with bilateral salpingo-oophorectomy 1968. She did not take hormone replacement.  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 Marya Amsler lives in [place]. The patient's daughter Sherri Rad lives in [place] and works in Programmer, applications. The patient has 3 grandchildren and 2 great-grandchildren. Her youngest grandchild graduated from high school June 2017. She attends a [employer] in [place]: In place   HEALTH MAINTENANCE: Social History   Tobacco Use  . Smoking status: Former Smoker    Last attempt to quit: 08/17/1994    Years since quitting: 23.0  . Smokeless tobacco: Never Used  Substance Use Topics  . Alcohol use: No  . Drug use: No     Colonoscopy:2010/[facility]  PAP: 2015  Bone density: 2016/[facility]  Lipid panel:  No Known Allergies  Current Outpatient Medications  Medication Sig Dispense Refill  . acetaminophen (TYLENOL) 500 MG tablet Take 1,000 mg by mouth 2 (two) times daily as needed (pain). Reported on 01/20/2016    . alendronate (FOSAMAX) 70 MG tablet Take 1 tablet (70 mg total) by mouth once a week. Take with a full glass of water on an empty stomach. 12 tablet 3  . amitriptyline (ELAVIL) 50 MG tablet Take 50 mg by mouth at bedtime.      Marland Kitchen anastrozole (ARIMIDEX) 1 MG tablet Take 1 tablet (1 mg total) by mouth daily. 90 tablet 3  .  aspirin EC 325 MG tablet Take 325 mg by mouth daily.    Marland Kitchen atorvastatin (LIPITOR) 40 MG tablet Take 40 mg by mouth at bedtime.     . Calcium Citrate-Vitamin D (CALCIUM + D PO) Take 2 tablets by mouth 3 (three) times daily. Each tablet Calcium 300 mg, Vitamin D3 250 I.U.    . Javier Docker Oil 1000 MG CAPS Take 1,000 mg by mouth daily.    Marland Kitchen levothyroxine (SYNTHROID, LEVOTHROID) 88 MCG tablet Take 88 mcg by mouth daily before breakfast.     . losartan-hydrochlorothiazide (HYZAAR) 100-12.5 MG per tablet Take 1 tablet by mouth daily.      . metFORMIN (GLUCOPHAGE) 500 MG tablet Take 500 mg by mouth daily.  4  . metoprolol (  LOPRESSOR) 50 MG tablet Take 25 mg by mouth 2 (two) times daily. Reported on 01/28/2016    . nitroGLYCERIN (NITROSTAT) 0.4 MG SL tablet Place 0.4 mg under the tongue every 5 (five) minutes as needed for chest pain. Reported on 01/28/2016     No current facility-administered medications for this visit.     OBJECTIVE: Middle-aged white woman who appears older than stated age  36:   08/19/17 1423  BP: 130/67  Pulse: 68  Resp: 20  Temp: 98.6 F (37 C)  SpO2: 95%     Body mass index is 30.07 kg/m.    ECOG FS:1 - Symptomatic but completely ambulatory   Sclerae unicteric, pupils round and equal Oropharynx clear and moist No cervical or supraclavicular adenopathy Lungs no rales or rhonchi Heart regular rate and rhythm Abd soft, nontender, positive bowel sounds MSK no focal spinal tenderness, no upper extremity lymphedema Neuro: nonfocal, well oriented, appropriate affect Breasts: The right breast is benign.  The left breast is undergone lumpectomy and radiation.  There is no evidence of local recurrence.  Both axillae  LAB RESULTS:  CMP     Component Value Date/Time   NA 132 (L) 12/01/2016 1251   K 4.8 12/01/2016 1251   CL 95 (L) 11/11/2015 1535   CO2 28 12/01/2016 1251   GLUCOSE 89 12/01/2016 1251   BUN 11.9 08/18/2017 1139   CREATININE 0.8 08/18/2017 1139   CALCIUM  9.6 12/01/2016 1251   PROT 7.3 12/01/2016 1251   ALBUMIN 3.5 12/01/2016 1251   AST 24 12/01/2016 1251   ALT 27 12/01/2016 1251   ALKPHOS 152 (H) 12/01/2016 1251   BILITOT 0.65 12/01/2016 1251   GFRNONAA >60 11/11/2015 1535   GFRAA >60 11/11/2015 1535    INo results found for: SPEP, UPEP  Lab Results  Component Value Date   WBC 7.6 08/19/2017   NEUTROABS 5.1 08/19/2017   HGB 14.1 08/19/2017   HCT 43.0 08/19/2017   MCV 88.8 08/19/2017   PLT 194 08/19/2017      Chemistry      Component Value Date/Time   NA 132 (L) 12/01/2016 1251   K 4.8 12/01/2016 1251   CL 95 (L) 11/11/2015 1535   CO2 28 12/01/2016 1251   BUN 11.9 08/18/2017 1139   CREATININE 0.8 08/18/2017 1139      Component Value Date/Time   CALCIUM 9.6 12/01/2016 1251   ALKPHOS 152 (H) 12/01/2016 1251   AST 24 12/01/2016 1251   ALT 27 12/01/2016 1251   BILITOT 0.65 12/01/2016 1251       No results found for: LABCA2  No components found for: LABCA125  No results for input(s): INR in the last 168 hours.  Urinalysis No results found for: COLORURINE, APPEARANCEUR, LABSPEC, PHURINE, GLUCOSEU, HGBUR, BILIRUBINUR, KETONESUR, PROTEINUR, UROBILINOGEN, NITRITE, LEUKOCYTESUR    ELIGIBLE FOR AVAILABLE RESEARCH PROTOCOL: No  STUDIES: Ct Chest W Contrast  Result Date: 08/18/2017 CLINICAL DATA:  72 year old female with history of breast and lung cancer. Followup study. EXAM: CT CHEST WITH CONTRAST TECHNIQUE: Multidetector CT imaging of the chest was performed during intravenous contrast administration. CONTRAST:  13m ISOVUE-300 IOPAMIDOL (ISOVUE-300) INJECTION 61% COMPARISON:  Chest CT 12/17/2016. FINDINGS: Cardiovascular: Heart size is normal. There is no significant pericardial fluid, thickening or pericardial calcification. There is aortic atherosclerosis, as well as atherosclerosis of the great vessels of the mediastinum and the coronary arteries, including calcified atherosclerotic plaque in the left main, left  circumflex and right coronary arteries. Status post median sternotomy  for CABG including LIMA to the LAD. Moderate stenosis at the origin of the left circumflex coronary artery. Mediastinum/Nodes: No pathologically enlarged mediastinal, internal mammary or hilar lymph nodes. Please note that accurate exclusion of hilar adenopathy is limited on noncontrast CT scans. Esophagus is unremarkable in appearance. Lungs/Pleura: There are few scattered small pulmonary nodules throughout the right lung which generally appear stable in size, number and distribution compared to the prior study, including the largest nodule near the apex of the right upper lobe (axial image 30 of series 7) which is similar to the prior examination, currently measuring 12 x 4 mm (previously 11 x 6 mm). No definite new suspicious appearing pulmonary nodules or masses are noted. No acute consolidative airspace disease. No pleural effusions. Diffuse bronchial wall thickening with mild centrilobular and paraseptal emphysema. Areas of scarring in the right lung base. Upper Abdomen: Diffuse low attenuation throughout the visualized hepatic parenchyma, compatible with hepatic steatosis. Aortic atherosclerosis. 2.7 cm right adrenal nodule and 1.6 cm left adrenal nodule are both incompletely characterized on today's examination but are stable compared to prior studies, previously characterized as adenomas. Musculoskeletal: Diffuse skin thickening in the left breast, presumably from prior radiation therapy. Focal area of architectural distortion in the deep subareolar region of the left breast slightly lateral, in contact with the underlying chest wall, currently measuring 4.8 x 3.3 cm, likely postoperative scarring at site of prior lumpectomy. Median sternotomy wires. New healing nondisplaced fracture of the anterolateral aspect of the right second rib. There are no aggressive appearing lytic or blastic lesions noted in the visualized portions of the  skeleton. IMPRESSION: 1. Multiple small pulmonary nodules scattered throughout the right lung, stable compared to the prior examination. No new suspicious appearing pulmonary nodules or masses are otherwise noted. 2. Postoperative changes of lumpectomy and radiation therapy in the left breast with an area of presumed postoperative scarring, as discussed above. Correlation with mammography is recommended, as the possibility of some locally recurrent disease is not entirely excluded on the basis of CT. 3. Aortic atherosclerosis, in addition to left main and 3 vessel coronary artery disease. Status post median sternotomy for CABG including LIMA to the LAD. 4. Bilateral adrenal adenomas similar to prior studies. 5. Diffuse bronchial wall thickening with mild centrilobular and paraseptal emphysema; imaging findings suggestive of underlying COPD. Aortic Atherosclerosis (ICD10-I70.0) and Emphysema (ICD10-J43.9). Electronically Signed   By: Vinnie Langton M.D.   On: 08/18/2017 14:01    ASSESSMENT: 72 y.o. [place] woman  (1) hypermetabolic right upper lobe lung mass noted on chest CT scan 09/11/2015, clinically T1a N0  (a) biopsy by video bronchoscopy 11/13/2015 nondiagnostic  (b) definitive stereotactic radiosurgery Completed 01/21/2016  (2) biopsy of a left upper outer quadrant breast mass 10/28/2015 showed a clinical T1c N0, stage IA invasive ductal carcinoma, grade 2, estrogen and progesterone strongly positive, HER-2 negative, with an MIB-1 of 6%.  (3) status post left lumpectomy and sentinel lymph node sampling 11/19/2015 for apT1c pN0, stage IA invasive ductal carcinoma, grade 2, repeat HER-2 again negative, with a focally positive inferior margin  (a) additional surgery for margin clearance  12/03/2015   (4) Adjuvant radiation:  01/08/16 - 01/30/16: Left Breast, 42.720 Gy in 16 fractions  01/14/16, 01/17/16, 01/21/16: Right Upper Lung, 54 Gy in 3 fractions  (5) started anastrozole 02/18/2016   (a)  bone density 10/28/2016 shows a T score of -2.9.   (b) on ibandronate as of July 2017, switch to alendronate April 2018 because of cost issues  PLAN: I spent approximately 30 minutes with Denise Mckenzie with most of that time spent discussing her to cancers.  As far as her lung cancer is concerned, there is no evidence of disease activity.  Of course her lungs are not entirely normal since there is a prior history of tobacco abuse as well as of prior treatment for the lung cancer.  Nevertheless there is no evidence of disease activity.  Normally we do not do CT scans to evaluate for metastatic breast cancer unless there are symptoms but of course we do not see evidence of either on her CT of the chest.  She is now almost 2 years out from her definitive breast cancer surgery with no evidence of disease recurrence.  This is very favorable.  The plan is to continue anastrozole for a total of 5 years  She is going to see me again in June.  She will have a repeat CT of the chest before that visit as well as repeat lab work.  She will then have a repeat CT scan of the chest in December and see me again in a call for any other issues that may develop before her next visit here.   Cleland Simkins, Virgie Dad, MD  08/19/17 2:43 PM Medical Oncology and Hematology [facility] [address] [place], [place] [address] Tel. [phone]    Fax. [phone]  This document serves as a record of services personally performed by Chauncey Cruel, MD. It was created on his behalf by Margit Banda, a trained medical scribe. The creation of this record is based on the scribe's personal observations and the provider's statements to them.   I have reviewed the above documentation for accuracy and completeness, and I agree with the above.

## 2017-09-27 ENCOUNTER — Other Ambulatory Visit: Payer: Self-pay | Admitting: Oncology

## 2017-09-27 ENCOUNTER — Other Ambulatory Visit: Payer: Self-pay | Admitting: Adult Health

## 2017-09-27 DIAGNOSIS — Z853 Personal history of malignant neoplasm of breast: Secondary | ICD-10-CM

## 2017-10-29 ENCOUNTER — Ambulatory Visit
Admission: RE | Admit: 2017-10-29 | Discharge: 2017-10-29 | Disposition: A | Discharge status: Home / Self Care | Payer: Medicare Other | Source: Ambulatory Visit | Attending: Oncology | Admitting: Oncology

## 2017-10-29 DIAGNOSIS — Z853 Personal history of malignant neoplasm of breast: Secondary | ICD-10-CM

## 2017-10-29 HISTORY — DX: Personal history of irradiation: Z92.3

## 2017-12-05 ENCOUNTER — Other Ambulatory Visit: Payer: Self-pay | Admitting: Oncology

## 2018-01-26 ENCOUNTER — Other Ambulatory Visit: Payer: Self-pay

## 2018-01-26 DIAGNOSIS — C50412 Malignant neoplasm of upper-outer quadrant of left female breast: Secondary | ICD-10-CM

## 2018-01-26 DIAGNOSIS — C3411 Malignant neoplasm of upper lobe, right bronchus or lung: Secondary | ICD-10-CM

## 2018-01-26 DIAGNOSIS — Z170 Estrogen receptor positive status [ER+]: Secondary | ICD-10-CM

## 2018-02-01 ENCOUNTER — Ambulatory Visit (HOSPITAL_COMMUNITY)
Admission: RE | Admit: 2018-02-01 | Discharge: 2018-02-01 | Disposition: A | Discharge status: Home / Self Care | Payer: Medicare Other | Source: Ambulatory Visit | Attending: Oncology | Admitting: Oncology

## 2018-02-01 ENCOUNTER — Other Ambulatory Visit: Payer: Self-pay

## 2018-02-01 ENCOUNTER — Encounter (HOSPITAL_COMMUNITY): Payer: Self-pay

## 2018-02-01 ENCOUNTER — Inpatient Hospital Stay: Payer: Medicare Other | Attending: Oncology

## 2018-02-01 DIAGNOSIS — D3502 Benign neoplasm of left adrenal gland: Secondary | ICD-10-CM

## 2018-02-01 DIAGNOSIS — J439 Emphysema, unspecified: Secondary | ICD-10-CM | POA: Insufficient documentation

## 2018-02-01 DIAGNOSIS — Z170 Estrogen receptor positive status [ER+]: Secondary | ICD-10-CM | POA: Insufficient documentation

## 2018-02-01 DIAGNOSIS — C50412 Malignant neoplasm of upper-outer quadrant of left female breast: Secondary | ICD-10-CM | POA: Insufficient documentation

## 2018-02-01 DIAGNOSIS — C3411 Malignant neoplasm of upper lobe, right bronchus or lung: Secondary | ICD-10-CM | POA: Insufficient documentation

## 2018-02-01 DIAGNOSIS — E039 Hypothyroidism, unspecified: Secondary | ICD-10-CM | POA: Insufficient documentation

## 2018-02-01 DIAGNOSIS — I700 Atherosclerosis of aorta: Secondary | ICD-10-CM | POA: Insufficient documentation

## 2018-02-01 DIAGNOSIS — K760 Fatty (change of) liver, not elsewhere classified: Secondary | ICD-10-CM

## 2018-02-01 DIAGNOSIS — D3501 Benign neoplasm of right adrenal gland: Secondary | ICD-10-CM | POA: Insufficient documentation

## 2018-02-01 HISTORY — DX: Malignant neoplasm of unspecified site of unspecified female breast: C50.919

## 2018-02-01 HISTORY — DX: Malignant neoplasm of uterus, part unspecified: C55

## 2018-02-01 LAB — CMP (CANCER CENTER ONLY)
ALT: 12 U/L (ref 0–55)
ANION GAP: 6 (ref 3–11)
AST: 15 U/L (ref 5–34)
Albumin: 3.6 g/dL (ref 3.5–5.0)
Alkaline Phosphatase: 114 U/L (ref 40–150)
BUN: 11 mg/dL (ref 7–26)
CHLORIDE: 97 mmol/L — AB (ref 98–109)
CO2: 29 mmol/L (ref 22–29)
CREATININE: 0.84 mg/dL (ref 0.60–1.10)
Calcium: 9.2 mg/dL (ref 8.4–10.4)
GFR, Est AFR Am: >60 mL/min (ref >60)
Glucose, Bld: 103 mg/dL (ref 70–140)
POTASSIUM: 4.7 mmol/L (ref 3.5–5.1)
SODIUM: 132 mmol/L — AB (ref 136–145)
Total Bilirubin: 0.4 mg/dL (ref 0.2–1.2)
Total Protein: 7.2 g/dL (ref 6.4–8.3)

## 2018-02-01 LAB — CBC WITH DIFFERENTIAL (CANCER CENTER ONLY)
Basophils Absolute: 0 10*3/uL (ref 0.0–0.1)
Basophils Relative: 0 %
EOS ABS: 0.2 10*3/uL (ref 0.0–0.5)
Eosinophils Relative: 2 %
HEMATOCRIT: 42.6 % (ref 34.8–46.6)
HEMOGLOBIN: 13.9 g/dL (ref 11.6–15.9)
LYMPHS PCT: 14 %
Lymphs Abs: 1.4 10*3/uL (ref 0.9–3.3)
MCH: 29.5 pg (ref 25.1–34.0)
MCHC: 32.6 g/dL (ref 31.5–36.0)
MCV: 90.4 fL (ref 79.5–101.0)
MONOS PCT: 8 %
Monocytes Absolute: 0.8 10*3/uL (ref 0.1–0.9)
NEUTROS ABS: 7.8 10*3/uL — AB (ref 1.5–6.5)
NEUTROS PCT: 76 %
Platelet Count: 183 10*3/uL (ref 145–400)
RBC: 4.71 MIL/uL (ref 3.70–5.45)
RDW: 14.5 % (ref 11.2–14.5)
WBC Count: 10.3 10*3/uL (ref 3.9–10.3)

## 2018-02-01 MED ORDER — LETROZOLE 2.5 MG PO TABS: 2.5000 mg | ORAL_TABLET | Freq: Every day | ORAL | 0 refills | Status: DC

## 2018-02-01 MED ORDER — IOHEXOL 300 MG/ML  SOLN
75.0000 mL | Freq: Once | INTRAMUSCULAR | Status: AC | PRN
  Administered 2018-02-01: 75 mL via INTRAVENOUS

## 2018-02-08 NOTE — Progress Notes (Signed)
[place]  [phone] [phone]     ID: Denise Mckenzie DOB: [birth year]  MR#: [id number]  TDS#:[id number]  Patient Care Team: Lorene Dy, MD as PCP - General (Internal Medicine) Grace Isaac, MD as Consulting Physician (Cardiothoracic Surgery) Donnie Mesa, MD as Consulting Physician (General Surgery) Thea Silversmith, MD as Consulting Physician (Radiation Oncology) Aalyssa Elderkin, Virgie Dad, MD as Consulting Physician (Oncology) Thea Silversmith, MD as Consulting Physician (Radiation Oncology) Curt Bears, MD as Consulting Physician (Oncology) PCP: Lorene Dy, MD GYN: OTHER MD:  CHIEF COMPLAINT: Lung cancer, breast cancer  CURRENT TREATMENT: Anastrozole, alendronate  INTERVAL HISTORY: Denise Mckenzie returns today for a follow-up and treatment of her estrogen receptor positive breast cancer as well as follow-up of her lung cancer. She continues on anastrozole, with good tolerance. She initially had 2 months of hot flashes, but these have resolved. She denies issues with vaginal dryness. Due to the anastrozole recall, she was temporarily on letrozole. She noticed a difference in cost from $6 of anastrozole to $16 of letrozole.    Since her last visit, she underwent diagnostic bilateral mammography with CAD and tomography on 10/29/2017 at [facility] showing: breast density category B. There was no evidence of malignancy.   As far as her lung cancer, she completed a CT chest on 02/01/2018 showing: Stable chest CT without evidence of local recurrence or progressive metastatic disease. Stable asymmetric density deep in the left breast and left breast dermal thickening. Stable small residual right upper lobe pulmonary nodule, consistent with a treated lesion. Stable bilateral adrenal adenomas. Aortic Atherosclerosis (ICD10-I70.0) and Emphysema (ICD10-J43.9).   REVIEW OF SYSTEM: Denise Mckenzie reports she has been spring cleaning. She sits down to rest  if she gets tired. 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 The pain increases with activity. Her daughter-in-law has pancreatic cancer, and Denise Mckenzie has been helping take care of her.  The patient denies unusual headaches, visual changes, nausea, vomiting, or dizziness. There has been no unusual cough, phlegm production, or pleurisy. This been no change in bowel or bladder habits. She denies unexplained fatigue or unexplained weight loss, bleeding, rash, or fever. A detailed review of systems was otherwise stable.    BREAST AND LUNG CANCER PRESENTING HISTORY:  LUNG CANCER From the earlier summary report:  Denise Mckenzie developed some cough early January 2017 and brought it to her primary care physician's attention. A chest x-ray was obtained 09/01/2014. This showed a vague lingular density measuring 1.4 cm. Further evaluation with a CT scan of the chest obtained 09/11/2015 found a 1.1 cm right upper lobe nodule. The area in the lingular density was felt to be scarring. There were other scattered nodules too small to characterize. There was no adenopathy and no bony lesions.  Accordingly on 10/11/2015 the patient underwent PET scan. The right upper lobe lung lesion was hypermetabolic, with an SUV max of 24.8. It measured 1.4 cm on the PET scan. At least for small pulmonary nodules were also again noted, the largest measuring 4 mm, all below PET resolution. There were felt to be likely benign. Incidentally a hypermetabolic 1 cm soft tissue nodule in the left breast was noted, with an SUV max of 4.1. A right adrenal mass measuring 2.5 cm was not hypermetabolic, consistent with a benign adenoma. CT scan of the head with and without contrast 10/22/2015 was benign  The patient's case was presented at the lung cancer conference. Dr. Servando Snare comments that the patient's diffusion capacity  was 28% and the FEV 1 was 47% of predicted, limiting surgical choices. Accordingly on 11/13/2015 the  patient underwent video bronchoscopy with endobronchial navigation and right lung biopsy with marker placement The pathology from this procedure ([id number]) showed only blood clot and scant mucosal tissue. It was not diagnostic that it appears to be clearly discordant given the earlier PET results. The patient completed definitive surgery for the lung lesion on 01/21/2016  BREAST CANCER From the initial intake note:  The incidentally noted left breast mass was further evaluated with bilateral diagnostic mammography with tomography and [facility]. This found a breast density to be category be. In the upper-outer quadrant there was a 1.0 cm spiculated mass which was not palpable. Ultrasonography confirmed a spiculated hypoechoic mass at the 2:00 position of the left breast, 3 cm from the nipple, measuring 1.0 cm. In the left axilla there was a 1.2 cm lymph node with a prominent cortex.  Biopsy of the left breast mass in question 11/04/2015 showed ([id number]) invasive ductal carcinoma, grade 2, estrogen receptor 100% positive, progesterone receptor 100% positive, both with strong staining intensity, with an MIB-1 of 6%, and no HER-2 amplification, the signals ratio being 1.32 and the number per cell 3.10. The suspicious left axillary lymph node was biopsied at the same time and it showed no evidence of carcinoma. This was felt to be concordant.  On 11/19/2015 Denise Mckenzie underwent left lumpectomy with sentinel lymph node sampling. The final pathology ([id number]) confirmed an invasive ductal carcinoma, grade 2, measuring 1.2 cm. Invasive disease was focally present at the inferior margin. In situ disease was less than 0.1 cm to the inferior margin as well. All 4 sentinel lymph nodes were clear. Repeat HER-2 was again negative with a signals ratio of 1.43, the number per cell being 3.50.  Her subsequent history is as detailed below.    Past Medical History:    Diagnosis Date  . Arthritis   . Breast cancer ([place]) dx'd 09/2015  . COPD (chronic obstructive pulmonary disease) ([place])   . Coronary artery disease   . Depression   . Diabetes mellitus ([place]) 10/22/2015  . Full dentures    full upper dentures, plate on bottom  . History of bronchitis   . History of pneumonia   . Hyperlipidemia   . Hypertension   . Hypothyroidism 10/22/2015  . lung ca dx;d 09/2015   lung cancer  . Myocardial infarction [facility]) July 1995  . Osteoporosis 10/22/2015  . Personal history of radiation therapy   . Stress incontinence   . Uterine cancer ([place]) dx'd 1968    PAST SURGICAL HISTORY: Past Surgical History:  Procedure Laterality Date  . ABDOMINAL HYSTERECTOMY     1968  . APPENDECTOMY     with hysterectomy  . [place]  . BREAST BIOPSY Left 11/04/2015   Malignant  . BREAST BIOPSY Left 11/04/2015   Malignant  . BREAST LUMPECTOMY Left 11/19/2015  . CARDIAC CATHETERIZATION  1995, 1996   Dr. Wynonia Lawman saw here then here at [facility] both times; 'they did a balloon and opened it up' first time, the 2nd was scheduled d/t abnormal stress test  . COLONOSCOPY W/ POLYPECTOMY    . CORONARY ARTERY BYPASS GRAFT     11/1994  . 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 LUNG BIOPSY N/A 11/13/2015   Procedure: RIGHT LUNG BIOPSY;  Surgeon: Grace Isaac, MD;  Location: [facility];  Service: Thoracic;  Laterality: N/A;  . MULTIPLE TOOTH EXTRACTIONS    . RADIOACTIVE SEED GUIDED PARTIAL MASTECTOMY WITH AXILLARY SENTINEL LYMPH NODE BIOPSY Left 11/19/2015   Procedure: RADIOACTIVE SEED GUIDED PARTIAL MASTECTOMY WITH AXILLARY SENTINEL LYMPH NODE BIOPSY;  Surgeon: Donnie Mesa, MD;  Location: [facility];  Service: General;  Laterality: Left;  . RE-EXCISION OF BREAST LUMPECTOMY Left 12/03/2015   Procedure: RE-EXCISION INFERIOR MARGIN OF LEFT BREAST LUMPECTOMY;  Surgeon: Donnie Mesa, MD;  Location: [facility];  Service: General;  Laterality:  Left;  . TMJ ARTHROPLASTY     1979  . TONSILLECTOMY    . VIDEO BRONCHOSCOPY WITH ENDOBRONCHIAL NAVIGATION N/A 11/13/2015   Procedure: VIDEO BRONCHOSCOPY WITH ENDOBRONCHIAL NAVIGATION, with placement of fudicial markers;  Surgeon: Grace Isaac, MD;  Location: [facility];  Service: Thoracic;  Laterality: N/A;    FAMILY HISTORY Family History  Problem Relation Age of Onset  . Alzheimer's disease Mother   . Heart attack Father   . Breast cancer Neg Hx   The patient's father died from a heart attack at age 33. The patient's mother died with Alzheimer's disease at age 67. Denise Mckenzie has one brother and one sister. The only cancer in the family was breast cancer in a maternal aunt diagnosed in her early 54s.  GYNECOLOGIC HISTORY:  No LMP recorded. Patient has had a hysterectomy. Menarche age 29, first live birth age 22. The patient is GX P2. She underwent total abdominal hysterectomy with bilateral salpingo-oophorectomy 1968. She did not take hormone replacement.  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 Denise Mckenzie lives in [place]. The patient's daughter Denise Mckenzie lives in [place] and works in Programmer, applications. The patient has 3 grandchildren and 2 great-grandchildren. Her youngest grandchild graduated from high school June 2017. She attends a [employer] in [place]: In place   HEALTH MAINTENANCE: Social History   Tobacco Use  . Smoking status: Former Smoker    Last attempt to quit: 08/17/1994    Years since quitting: 23.4  . Smokeless tobacco: Never Used  Substance Use Topics  . Alcohol use: No  . Drug use: No     Colonoscopy:2010/[facility]  PAP: 2015  Bone density: 2016/[facility]  Lipid panel:  No Known Allergies  Current Outpatient Medications  Medication Sig Dispense Refill  . acetaminophen (TYLENOL) 500 MG tablet  Take 1,000 mg by mouth 2 (two) times daily as needed (pain). Reported on 01/20/2016    . alendronate (FOSAMAX) 70 MG tablet TAKE 1 TABLET BY MOUTH ONCE A WEEK. TAKE WITH FULL GLASS OF WATER ON EMPTY STOMACH. 12 tablet 3  . amitriptyline (ELAVIL) 50 MG tablet Take 50 mg by mouth at bedtime.      Marland Kitchen anastrozole (ARIMIDEX) 1 MG tablet Take 1 tablet (1 mg total) by mouth daily. 90 tablet 3  . aspirin EC 325 MG tablet Take 325 mg by mouth daily.    Marland Kitchen atorvastatin (LIPITOR) 40 MG tablet Take 40 mg by mouth at bedtime.     . Calcium Citrate-Vitamin D (CALCIUM + D PO) Take 2 tablets by mouth 3 (three) times daily. Each tablet Calcium 300 mg, Vitamin D3 250 I.U.    . Denise Mckenzie Oil 1000 MG CAPS Take 1,000 mg by mouth daily.    Marland Kitchen  letrozole (FEMARA) 2.5 MG tablet Take 1 tablet (2.5 mg total) by mouth daily. 90 tablet 0  . levothyroxine (SYNTHROID, LEVOTHROID) 88 MCG tablet Take 88 mcg by mouth daily before breakfast.     . losartan-hydrochlorothiazide (HYZAAR) 100-12.5 MG per tablet Take 1 tablet by mouth daily.      . metFORMIN (GLUCOPHAGE) 500 MG tablet Take 500 mg by mouth daily.  4  . metoprolol (LOPRESSOR) 50 MG tablet Take 25 mg by mouth 2 (two) times daily. Reported on 01/28/2016    . nitroGLYCERIN (NITROSTAT) 0.4 MG SL tablet Place 0.4 mg under the tongue every 5 (five) minutes as needed for chest pain. Reported on 01/28/2016     No current facility-administered medications for this visit.     OBJECTIVE: Middle-aged white woman in no acute distress  Vitals:   02/09/18 0920  BP: 130/67  Pulse: 67  Resp: 18  Temp: 98.3 F (36.8 C)  SpO2: 95%     Body mass index is 30.09 kg/m.    ECOG FS:1 - Symptomatic but completely ambulatory   Sclerae unicteric, EOMs intact Oropharynx clear and moist No cervical or supraclavicular adenopathy Lungs no rales or rhonchi; mild bronchitic cough during exam Heart regular rate and rhythm Abd soft, nontender, positive bowel sounds MSK no focal spinal tenderness, no  upper extremity lymphedema Neuro: nonfocal, well oriented, appropriate affect Breasts: The right breast is unremarkable.  The left breast status post lumpectomy and radiation.  There is no evidence of local recurrence.  Both axillae are benign.  LAB RESULTS:  CMP     Component Value Date/Time   NA 132 (L) 02/01/2018 0911   NA 136 08/19/2017 1411   K 4.7 02/01/2018 0911   K 4.5 08/19/2017 1411   CL 97 (L) 02/01/2018 0911   CO2 29 02/01/2018 0911   CO2 30 (H) 08/19/2017 1411   GLUCOSE 103 02/01/2018 0911   GLUCOSE 91 08/19/2017 1411   BUN 11 02/01/2018 0911   BUN 14.7 08/19/2017 1411   CREATININE 0.84 02/01/2018 0911   CREATININE 1.0 08/19/2017 1411   CALCIUM 9.2 02/01/2018 0911   CALCIUM 9.2 08/19/2017 1411   PROT 7.2 02/01/2018 0911   PROT 6.8 08/19/2017 1411   ALBUMIN 3.6 02/01/2018 0911   ALBUMIN 3.4 (L) 08/19/2017 1411   AST 15 02/01/2018 0911   AST 18 08/19/2017 1411   ALT 12 02/01/2018 0911   ALT 14 08/19/2017 1411   ALKPHOS 114 02/01/2018 0911   ALKPHOS 112 08/19/2017 1411   BILITOT 0.4 02/01/2018 0911   BILITOT 0.41 08/19/2017 1411   GFRNONAA >60 02/01/2018 0911   GFRAA >60 02/01/2018 0911    INo results found for: SPEP, UPEP  Lab Results  Component Value Date   WBC 10.3 02/01/2018   NEUTROABS 7.8 (H) 02/01/2018   HGB 13.9 02/01/2018   HCT 42.6 02/01/2018   MCV 90.4 02/01/2018   PLT 183 02/01/2018      Chemistry      Component Value Date/Time   NA 132 (L) 02/01/2018 0911   NA 136 08/19/2017 1411   K 4.7 02/01/2018 0911   K 4.5 08/19/2017 1411   CL 97 (L) 02/01/2018 0911   CO2 29 02/01/2018 0911   CO2 30 (H) 08/19/2017 1411   BUN 11 02/01/2018 0911   BUN 14.7 08/19/2017 1411   CREATININE 0.84 02/01/2018 0911   CREATININE 1.0 08/19/2017 1411      Component Value Date/Time   CALCIUM 9.2 02/01/2018 0911   CALCIUM  9.2 08/19/2017 1411   ALKPHOS 114 02/01/2018 0911   ALKPHOS 112 08/19/2017 1411   AST 15 02/01/2018 0911   AST 18 08/19/2017 1411     ALT 12 02/01/2018 0911   ALT 14 08/19/2017 1411   BILITOT 0.4 02/01/2018 0911   BILITOT 0.41 08/19/2017 1411       No results found for: LABCA2  No components found for: LABCA125  No results for input(s): INR in the last 168 hours.  Urinalysis No results found for: COLORURINE, APPEARANCEUR, LABSPEC, PHURINE, GLUCOSEU, HGBUR, BILIRUBINUR, KETONESUR, PROTEINUR, UROBILINOGEN, NITRITE, LEUKOCYTESUR    ELIGIBLE FOR AVAILABLE RESEARCH PROTOCOL: No  STUDIES: Ct Chest W Contrast  Result Date: 02/01/2018 CLINICAL DATA:  Primary right upper lobe lung cancer post radiation therapy completed 2017. Left breast cancer with completed radiation therapy in 2017. Oral chemotherapy ongoing. EXAM: CT CHEST WITH CONTRAST TECHNIQUE: Multidetector CT imaging of the chest was performed during intravenous contrast administration. CONTRAST:  53m OMNIPAQUE IOHEXOL 300 MG/ML  SOLN COMPARISON:  Chest CT 08/18/2017 and 12/17/2016. Other older studies dating back to 09/11/2015 are correlated. FINDINGS: Cardiovascular: Diffuse atherosclerosis of the aorta, great vessels and coronary arteries again noted status post median sternotomy and CABG. 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 The heart size is normal. There is no pericardial effusion. Mediastinum/Nodes: There is a stable 12 mm right hilar node on image 61/2. No other enlarged mediastinal, hilar or axillary lymph nodes. There is stable mild thyroid nodularity.The esophagus appears normal. Lungs/Pleura: There is no pleural effusion or pneumothorax. There is mild centrilobular and paraseptal emphysema. Right upper lobe fiduciary clips and 10 x 5 mm nodule (image 31/5) are grossly stable. No new or enlarging pulmonary nodules. Upper abdomen: Stable bilateral adrenal nodules, measuring 2.1 x 1.9 cm on the right (image 126/2) and 1.6 x 1.5 cm on the left (image 131/2). These are consistent with adenomas based on long term stability.  Stable appearance of the gallbladder with probable sludge and/or mild chronic wall thickening. Musculoskeletal/Chest wall: Stable asymmetric density centrally in the left breast adjacent to the surgical clips and left breast dermal thickening. No enlarging chest wall mass or suspicious osseous findings. Healing fracture of the right 2nd rib laterally. Thoracic spine degenerative changes are noted. IMPRESSION: 1. Stable chest CT without evidence of local recurrence or progressive metastatic disease. 2. Stable asymmetric density deep in the left breast and left breast dermal thickening. 3. Stable small residual right upper lobe pulmonary nodule, consistent with a treated lesion. 4. Stable bilateral adrenal adenomas. 5. Aortic Atherosclerosis (ICD10-I70.0) and Emphysema (ICD10-J43.9). Electronically Signed   By: WRichardean SaleM.D.   On: 02/01/2018 14:59    ASSESSMENT: 72y.o. [place] woman  LUNG CANCER: (1) hypermetabolic right upper lobe lung mass noted on chest CT scan 09/11/2015, clinically T1a N0  (a) biopsy by video bronchoscopy 11/13/2015 nondiagnostic  (b) definitive stereotactic radiosurgery Completed 01/21/2016  (c) follow up: most recent chest CT scan 02/01/2018 shows no evidence of progressive or recurrent disease  BREAST CANCER: (2) biopsy of a left upper outer quadrant breast mass 10/28/2015 showed a clinical T1c N0, stage IA invasive ductal carcinoma, grade 2, estrogen and progesterone strongly positive, HER-2 negative, with an MIB-1 of 6%.  (3) status post left lumpectomy and sentinel lymph node sampling 11/19/2015 for apT1c pN0, stage IA invasive ductal carcinoma, grade 2, repeat HER-2 again negative, with a focally positive inferior margin  (a) additional surgery for margin clearance  12/03/2015   (  4) Adjuvant radiation:  01/08/16 - 01/30/16: Left Breast, 42.720 Gy in 16 fractions  01/14/16, 01/17/16, 01/21/16: Right Upper Lung, 54 Gy in 3 fractions  (5) started anastrozole  02/18/2016   (a) bone density 10/28/2016 shows a T score of -2.9.   (b) on ibandronate as of July 2017, switch to alendronate April 2018 because of cost issues    PLAN: Josceline is now just a little over 2 years out from definitive surgery for her breast cancer with no evidence of disease recurrence.  This is very favorable.  The plan is to continue anastrozole and also alendronate for a total of 5 years.  She will have a repeat bone density in March 2020 at the time of her next mammography.  We also reviewed her CT scan of the chest, which is entirely stable.  We will repeat that again next March as well.  We discussed her daughter-in-law situation in detail.  I also encouraged her to continue to be as active as possible and suggested she participate in our tai chi class here.  She is doing a good job with her diabetes (A1c down to 6.1) and of course she has significant atherosclerosis which also will be improved with exercise and good cholesterol control  She will see me again May 2020.  She knows to call for problems before then as needed  Jahnavi Muratore, Virgie Dad, MD  02/09/18 9:51 AM Medical Oncology and Hematology [facility] [address] [place], [place] [address] Tel. [phone]    Fax. [phone]  Alice Rieger, am acting as scribe for Chauncey Cruel MD.  I, Lurline Del MD, have reviewed the above documentation for accuracy and completeness, and I agree with the above.

## 2018-02-09 ENCOUNTER — Inpatient Hospital Stay: Payer: Medicare Other | Admitting: Oncology

## 2018-02-09 ENCOUNTER — Telehealth: Payer: Self-pay | Admitting: Oncology

## 2018-02-09 VITALS — BP 130/67 | HR 67 | Temp 98.3°F | Resp 18 | Ht 62.0 in | Wt 164.5 lb

## 2018-02-09 DIAGNOSIS — E119 Type 2 diabetes mellitus without complications: Secondary | ICD-10-CM

## 2018-02-09 DIAGNOSIS — C3411 Malignant neoplasm of upper lobe, right bronchus or lung: Secondary | ICD-10-CM | POA: Diagnosis not present

## 2018-02-09 DIAGNOSIS — C50412 Malignant neoplasm of upper-outer quadrant of left female breast: Secondary | ICD-10-CM

## 2018-02-09 DIAGNOSIS — E039 Hypothyroidism, unspecified: Secondary | ICD-10-CM | POA: Diagnosis not present

## 2018-02-09 DIAGNOSIS — K760 Fatty (change of) liver, not elsewhere classified: Secondary | ICD-10-CM | POA: Diagnosis not present

## 2018-02-09 DIAGNOSIS — Z170 Estrogen receptor positive status [ER+]: Secondary | ICD-10-CM

## 2018-02-09 DIAGNOSIS — I700 Atherosclerosis of aorta: Secondary | ICD-10-CM

## 2018-02-09 MED ORDER — ANASTROZOLE 1 MG PO TABS: 1.0000 mg | ORAL_TABLET | Freq: Every day | ORAL | 3 refills | Status: DC

## 2018-09-01 ENCOUNTER — Other Ambulatory Visit: Payer: Self-pay | Admitting: Oncology

## 2018-09-01 DIAGNOSIS — Z853 Personal history of malignant neoplasm of breast: Secondary | ICD-10-CM

## 2018-11-03 ENCOUNTER — Other Ambulatory Visit: Payer: Medicare Other

## 2018-11-13 ENCOUNTER — Other Ambulatory Visit: Payer: Self-pay | Admitting: Oncology

## 2018-11-25 ENCOUNTER — Other Ambulatory Visit: Payer: Self-pay | Admitting: Oncology

## 2018-11-25 DIAGNOSIS — Z170 Estrogen receptor positive status [ER+]: Secondary | ICD-10-CM

## 2018-11-25 DIAGNOSIS — E119 Type 2 diabetes mellitus without complications: Secondary | ICD-10-CM

## 2018-11-25 DIAGNOSIS — I700 Atherosclerosis of aorta: Secondary | ICD-10-CM

## 2018-11-25 DIAGNOSIS — K760 Fatty (change of) liver, not elsewhere classified: Secondary | ICD-10-CM

## 2018-11-25 DIAGNOSIS — C3411 Malignant neoplasm of upper lobe, right bronchus or lung: Secondary | ICD-10-CM

## 2018-11-25 DIAGNOSIS — C50412 Malignant neoplasm of upper-outer quadrant of left female breast: Secondary | ICD-10-CM

## 2019-01-03 ENCOUNTER — Encounter: Payer: Self-pay | Admitting: Adult Health

## 2019-01-03 ENCOUNTER — Inpatient Hospital Stay: Payer: Medicare Other | Attending: Oncology

## 2019-01-03 ENCOUNTER — Inpatient Hospital Stay: Payer: Medicare Other | Admitting: Adult Health

## 2019-01-03 ENCOUNTER — Other Ambulatory Visit: Payer: Self-pay

## 2019-01-03 VITALS — BP 102/66 | HR 73 | Temp 98.2°F | Resp 16 | Ht 62.0 in | Wt 163.8 lb

## 2019-01-03 DIAGNOSIS — Z170 Estrogen receptor positive status [ER+]: Secondary | ICD-10-CM | POA: Insufficient documentation

## 2019-01-03 DIAGNOSIS — C50412 Malignant neoplasm of upper-outer quadrant of left female breast: Secondary | ICD-10-CM | POA: Insufficient documentation

## 2019-01-03 DIAGNOSIS — M2550 Pain in unspecified joint: Secondary | ICD-10-CM | POA: Insufficient documentation

## 2019-01-03 DIAGNOSIS — M810 Age-related osteoporosis without current pathological fracture: Secondary | ICD-10-CM | POA: Diagnosis not present

## 2019-01-03 DIAGNOSIS — Z8249 Family history of ischemic heart disease and other diseases of the circulatory system: Secondary | ICD-10-CM | POA: Diagnosis not present

## 2019-01-03 DIAGNOSIS — C3411 Malignant neoplasm of upper lobe, right bronchus or lung: Secondary | ICD-10-CM

## 2019-01-03 DIAGNOSIS — Z87891 Personal history of nicotine dependence: Secondary | ICD-10-CM | POA: Diagnosis not present

## 2019-01-03 DIAGNOSIS — Z820 Family history of epilepsy and other diseases of the nervous system: Secondary | ICD-10-CM | POA: Insufficient documentation

## 2019-01-03 DIAGNOSIS — Z923 Personal history of irradiation: Secondary | ICD-10-CM | POA: Insufficient documentation

## 2019-01-03 LAB — CBC WITH DIFFERENTIAL/PLATELET
Abs Immature Granulocytes: 0.03 10*3/uL (ref 0.00–0.07)
Basophils Absolute: 0.1 10*3/uL (ref 0.0–0.1)
Basophils Relative: 1 %
Eosinophils Absolute: 0.3 10*3/uL (ref 0.0–0.5)
Eosinophils Relative: 4 %
HCT: 48.1 % — ABNORMAL HIGH (ref 36.0–46.0)
Hemoglobin: 15.2 g/dL — ABNORMAL HIGH (ref 12.0–15.0)
Immature Granulocytes: 0 %
Lymphocytes Relative: 16 %
Lymphs Abs: 1.3 10*3/uL (ref 0.7–4.0)
MCH: 28.7 pg (ref 26.0–34.0)
MCHC: 31.6 g/dL (ref 30.0–36.0)
MCV: 90.8 fL (ref 80.0–100.0)
Monocytes Absolute: 0.6 10*3/uL (ref 0.1–1.0)
Monocytes Relative: 8 %
Neutro Abs: 5.9 10*3/uL (ref 1.7–7.7)
Neutrophils Relative %: 71 %
Platelets: 178 10*3/uL (ref 150–400)
RBC: 5.3 MIL/uL — ABNORMAL HIGH (ref 3.87–5.11)
RDW: 14.2 % (ref 11.5–15.5)
WBC: 8.3 10*3/uL (ref 4.0–10.5)
nRBC: 0 % (ref 0.0–0.2)

## 2019-01-03 LAB — COMPREHENSIVE METABOLIC PANEL
ALT: 14 U/L (ref 0–44)
AST: 16 U/L (ref 15–41)
Albumin: 3.6 g/dL (ref 3.5–5.0)
Alkaline Phosphatase: 115 U/L (ref 38–126)
Anion gap: 7 (ref 5–15)
BUN: 11 mg/dL (ref 8–23)
CO2: 30 mmol/L (ref 22–32)
Calcium: 9.4 mg/dL (ref 8.9–10.3)
Chloride: 96 mmol/L — ABNORMAL LOW (ref 98–111)
Creatinine, Ser: 0.94 mg/dL (ref 0.44–1.00)
GFR calc Af Amer: >60 mL/min (ref >60)
GFR calc non Af Amer: 60 mL/min — ABNORMAL LOW (ref >60)
Glucose, Bld: 99 mg/dL (ref 70–99)
Potassium: 4.6 mmol/L (ref 3.5–5.1)
Sodium: 133 mmol/L — ABNORMAL LOW (ref 135–145)
Total Bilirubin: 0.4 mg/dL (ref 0.3–1.2)
Total Protein: 7.3 g/dL (ref 6.5–8.1)

## 2019-01-03 MED ORDER — KETOCONAZOLE 2 % EX CREA: 1.0000 "application " | TOPICAL_CREAM | Freq: Every day | CUTANEOUS | 0 refills | Status: DC

## 2019-01-03 NOTE — Progress Notes (Signed)
[facility]  Telephone:([phone]) [phone] Fax:(336) [phone]     ID: Denise Mckenzie DOB: 22-Mar-1946  MR#: [id number]  DXI#:[id number]  Patient Care Team: Lorene Dy, MD as PCP - General (Internal Medicine) Grace Isaac, MD as Consulting Physician (Cardiothoracic Surgery) Donnie Mesa, MD as Consulting Physician (General Surgery) Thea Silversmith, MD as Consulting Physician (Radiation Oncology) Magrinat, Virgie Dad, MD as Consulting Physician (Oncology) Thea Silversmith, MD as Consulting Physician (Radiation Oncology) Curt Bears, MD as Consulting Physician (Oncology) PCP: Lorene Dy, MD GYN: OTHER MD:  CHIEF COMPLAINT: Lung cancer, breast cancer  CURRENT TREATMENT: Anastrozole, alendronate (Fosamax)  INTERVAL HISTORY: Denise Mckenzie returns today for a follow-up and treatment of her estrogen receptor positive breast cancer as well as follow-up of her lung cancer. She continues on anastrozole, with good tolerance. She has occasional arthralgias, and notes this may be more related to arthritis.    Denise Mckenzie notes that her bone density and mammogram that was scheduled for march was delayed to July due to [place] M[place] pandemic.  She also has annual CT chest.  This is due again next month.    REVIEW OF SYSTEM: Denise Mckenzie is feeling well today.  She notes other than being cooped up in her house she is doing well.  She exercises by walking on her treadmill at least three times per week for thirty minutes an occasion.  She notes she tries to stay sugar free as much as possible.  Denise Mckenzie sees her PCP regularly.  She has regular labs with her primary care provider and these are due again in 04/2019.  She is up to date with colon cancer screening.  She has skin cancer checks with her PCP during her annual physical.  She notes that she has been recommended to discontinue her pap smears.    Denise Mckenzie has no unusual headaches or vision changes.  She is without fever or chills, cough, shortness of  breath, chest pain or palpitations.  She hasn't noted dysphagia, nausea, vomiting, bowel or bladder changes.  She hasn't had any new pain, or any other concerning symptoms.  A detailed ROS was non contributory today.    BREAST AND LUNG CANCER PRESENTING HISTORY:  LUNG CANCER From the earlier summary report:  Denise Mckenzie developed some cough early January 2017 and brought it to her primary care physician's attention. A chest x-ray was obtained 09/01/2014. This showed a vague lingular density measuring 1.4 cm. Further evaluation with a CT scan of the chest obtained 09/11/2015 found a 1.1 cm right upper lobe nodule. The area in the lingular density was felt to be scarring. There were other scattered nodules too small to characterize. There was no adenopathy and no bony lesions.  Accordingly on 10/11/2015 the patient underwent PET scan. The right upper lobe lung lesion was hypermetabolic, with an SUV max of 24.8. It measured 1.4 cm on the PET scan. At least for small pulmonary nodules were also again noted, the largest measuring 4 mm, all below PET resolution. There were felt to be likely benign. Incidentally a hypermetabolic 1 cm soft tissue nodule in the left breast was noted, with an SUV max of 4.1. A right adrenal mass measuring 2.5 cm was not hypermetabolic, consistent with a benign adenoma. CT scan of the head with and without contrast 10/22/2015 was benign  The patient's case was presented at the lung cancer conference. Dr. Servando Snare comments that the patient's diffusion capacity was 28% and the FEV 1 was 47% of predicted, limiting surgical choices. Accordingly on 11/13/2015  the patient underwent video bronchoscopy with endobronchial navigation and right lung biopsy with marker placement The pathology from this procedure ([id number]) showed only blood clot and scant mucosal tissue. It was not diagnostic that it appears to be clearly discordant given the earlier PET results. The patient completed definitive  surgery for the lung lesion on 01/21/2016  BREAST CANCER From the initial intake note:  The incidentally noted left breast mass was further evaluated with bilateral diagnostic mammography with tomography and [facility] 10/28/2015. This found a breast density to be category be. In the upper-outer quadrant there was a 1.0 cm spiculated mass which was not palpable. Ultrasonography confirmed a spiculated hypoechoic mass at the 2:00 position of the left breast, 3 cm from the nipple, measuring 1.0 cm. In the left axilla there was a 1.2 cm lymph node with a prominent cortex.  Biopsy of the left breast mass in question 11/04/2015 showed ([id number]) invasive ductal carcinoma, grade 2, estrogen receptor 100% positive, progesterone receptor 100% positive, both with strong staining intensity, with an MIB-1 of 6%, and no HER-2 amplification, the signals ratio being 1.32 and the number per cell 3.10. The suspicious left axillary lymph node was biopsied at the same time and it showed no evidence of carcinoma. This was felt to be concordant.  On 11/19/2015 Denise Mckenzie underwent left lumpectomy with sentinel lymph node sampling. The final pathology ([id number]) confirmed an invasive ductal carcinoma, grade 2, measuring 1.2 cm. Invasive disease was focally present at the inferior margin. In situ disease was less than 0.1 cm to the inferior margin as well. All 4 sentinel lymph nodes were clear. Repeat HER-2 was again negative with a signals ratio of 1.43, the number per cell being 3.50.  Her subsequent history is as detailed below.    Past Medical History:  Diagnosis Date  . Arthritis   . Breast cancer ([place]) dx'd 09/2015  . COPD (chronic obstructive pulmonary disease) ([place])   . Coronary artery disease   . Depression   . Diabetes mellitus ([place]) 10/22/2015  . Full dentures    full upper dentures, plate on bottom  . History of bronchitis   . History of pneumonia   .  Hyperlipidemia   . Hypertension   . Hypothyroidism 10/22/2015  . lung ca dx;d 09/2015   lung cancer  . Myocardial infarction [facility]) July 1995  . Osteoporosis 10/22/2015  . Personal history of radiation therapy   . Stress incontinence   . Uterine cancer ([facility]) dx'd 1968    PAST SURGICAL HISTORY: Past Surgical History:  Procedure Laterality Date  . ABDOMINAL HYSTERECTOMY     1968  . APPENDECTOMY     with hysterectomy  . [place]  . BREAST BIOPSY Left 11/04/2015   Malignant  . BREAST BIOPSY Left 11/04/2015   Malignant  . BREAST LUMPECTOMY Left 11/19/2015  . CARDIAC CATHETERIZATION  1995, 1996   Dr. Wynonia Lawman saw here then here at [facility] both times; 'they did a balloon and opened it up' first time, the 2nd was scheduled d/t abnormal stress test  . COLONOSCOPY W/ POLYPECTOMY    . CORONARY ARTERY BYPASS GRAFT     11/1994  . 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 LUNG BIOPSY N/A 11/13/2015   Procedure: RIGHT LUNG BIOPSY;  Surgeon: Grace Isaac, MD;  Location: [facility];  Service: Thoracic;  Laterality: N/A;  .  MULTIPLE TOOTH EXTRACTIONS    . RADIOACTIVE SEED GUIDED PARTIAL MASTECTOMY WITH AXILLARY SENTINEL LYMPH NODE BIOPSY Left 11/19/2015   Procedure: RADIOACTIVE SEED GUIDED PARTIAL MASTECTOMY WITH AXILLARY SENTINEL LYMPH NODE BIOPSY;  Surgeon: Donnie Mesa, MD;  Location: [facility];  Service: General;  Laterality: Left;  . RE-EXCISION OF BREAST LUMPECTOMY Left 12/03/2015   Procedure: RE-EXCISION INFERIOR MARGIN OF LEFT BREAST LUMPECTOMY;  Surgeon: Donnie Mesa, MD;  Location: [facility];  Service: General;  Laterality: Left;  . TMJ ARTHROPLASTY     1979  . TONSILLECTOMY    . VIDEO BRONCHOSCOPY WITH ENDOBRONCHIAL NAVIGATION N/A 11/13/2015   Procedure: VIDEO BRONCHOSCOPY WITH ENDOBRONCHIAL NAVIGATION, with placement of fudicial markers;  Surgeon: Grace Isaac, MD;  Location: [facility];  Service: Thoracic;  Laterality: N/A;     FAMILY HISTORY Family History  Problem Relation Age of Onset  . Alzheimer's disease Mother   . Heart attack Father   . Breast cancer Neg Hx   The patient's father died from a heart attack at age 30. The patient's mother died with Alzheimer's disease at age 63. Denise Mckenzie has one brother and one sister. The only cancer in the family was breast cancer in a maternal aunt diagnosed in her early 32s.  GYNECOLOGIC HISTORY:  No LMP recorded. Patient has had a hysterectomy. Menarche age 45, first live birth age 74. The patient is GX P2. She underwent total abdominal hysterectomy with bilateral salpingo-oophorectomy 1968. She did not take hormone replacement.  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 Denise Mckenzie lives in [place]. The patient's daughter Denise Mckenzie lives in [place] and works in Programmer, applications. The patient has 3 grandchildren and 2 great-grandchildren. Her youngest grandchild graduated from high school June 2017. She attends a [employer] in [place]: In place   HEALTH MAINTENANCE: Social History   Tobacco Use  . Smoking status: Former Smoker    Last attempt to quit: 08/17/1994    Years since quitting: 24.3  . Smokeless tobacco: Never Used  Substance Use Topics  . Alcohol use: No  . Drug use: No     Colonoscopy:2010/[facility]  PAP: 2015  Bone density: 2016/[facility]  Lipid panel:  No Known Allergies  Current Outpatient Medications  Medication Sig Dispense Refill  . acetaminophen (TYLENOL) 500 MG tablet Take 1,000 mg by mouth 2 (two) times daily as needed (pain). Reported on 01/20/2016    . alendronate (FOSAMAX) 70 MG tablet TAKE 1 TABLET BY MOUTH ONCE A WEEK. TAKE WITH FULL GLASS OF WATER ON EMPTY STOMACH. 12 tablet 3  . amitriptyline (ELAVIL) 50 MG tablet Take 50 mg by mouth at bedtime.      Marland Kitchen anastrozole  (ARIMIDEX) 1 MG tablet Take 1 tablet (1 mg total) by mouth daily. 90 tablet 3  . aspirin EC 325 MG tablet Take 325 mg by mouth daily.    Marland Kitchen atorvastatin (LIPITOR) 40 MG tablet Take 40 mg by mouth at bedtime.     . Calcium Citrate-Vitamin D (CALCIUM + D PO) Take 2 tablets by mouth 3 (three) times daily. Each tablet Calcium 300 mg, Vitamin D3 250 I.U.    . Javier Docker Oil 1000 MG CAPS Take 1,000 mg by mouth daily.    Marland Kitchen levothyroxine (SYNTHROID, LEVOTHROID) 88 MCG tablet Take 88 mcg by mouth daily before breakfast.     .  losartan-hydrochlorothiazide (HYZAAR) 100-12.5 MG per tablet Take 1 tablet by mouth daily.      . metFORMIN (GLUCOPHAGE) 500 MG tablet Take 500 mg by mouth daily.  4  . metoprolol (LOPRESSOR) 50 MG tablet Take 25 mg by mouth 2 (two) times daily. Reported on 01/28/2016    . nitroGLYCERIN (NITROSTAT) 0.4 MG SL tablet Place 0.4 mg under the tongue every 5 (five) minutes as needed for chest pain. Reported on 01/28/2016     No current facility-administered medications for this visit.     OBJECTIVE: Vitals:   05/Mckenzie/20 0956  BP: 102/66  Pulse: 73  Resp: 16  Temp: 98.2 F (36.8 C)  SpO2: 94%     Body mass index is 29.96 kg/m.    ECOG FS:1 - Symptomatic but completely ambulatory  GENERAL: Patient is a well appearing female in no acute distress HEENT:  Sclerae anicteric.  Oropharynx clear and moist. No ulcerations or evidence of oropharyngeal candidiasis. Neck is supple.  NODES:  No cervical, supraclavicular, or axillary lymphadenopathy palpated.  BREAST EXAM:  Right breast benign, fungal appearing rash in inframammary fold, left breast s/p lumpectomy and radiation, no sign of recurrence, fungal appearing rash in inframammary fold.   LUNGS:  Clear to auscultation bilaterally.  No wheezes or rhonchi. HEART:  Regular rate and rhythm. No murmur appreciated. ABDOMEN:  Soft, nontender.  Positive, normoactive bowel sounds. No organomegaly palpated. MSK:  No focal spinal tenderness to palpation.  Full range of motion bilaterally in the upper extremities. EXTREMITIES:  No peripheral edema.   SKIN:  Clear with no obvious rashes or skin changes. No nail dyscrasia. NEURO:  Nonfocal. Well oriented.  Appropriate affect.   LAB RESULTS:  CMP     Component Value Date/Time   NA 133 (L) 05/Mckenzie/2020 0936   NA 136 08/19/2017 1411   K 4.6 05/Mckenzie/2020 0936   K 4.5 08/19/2017 1411   CL 96 (L) 05/Mckenzie/2020 0936   CO2 30 05/Mckenzie/2020 0936   CO2 30 (H) 08/19/2017 1411   GLUCOSE 99 05/Mckenzie/2020 0936   GLUCOSE 91 08/19/2017 1411   BUN 11 05/Mckenzie/2020 0936   BUN 14.7 08/19/2017 1411   CREATININE 0.94 05/Mckenzie/2020 0936   CREATININE 0.84 02/01/2018 0911   CREATININE 1.0 08/19/2017 1411   CALCIUM 9.4 05/Mckenzie/2020 0936   CALCIUM 9.2 08/19/2017 1411   PROT 7.3 05/Mckenzie/2020 0936   PROT 6.8 08/19/2017 1411   ALBUMIN 3.6 05/Mckenzie/2020 0936   ALBUMIN 3.4 (L) 08/19/2017 1411   AST 16 05/Mckenzie/2020 0936   AST 15 02/01/2018 0911   AST 18 08/19/2017 1411   ALT 14 05/Mckenzie/2020 0936   ALT 12 02/01/2018 0911   ALT 14 08/19/2017 1411   ALKPHOS 115 05/Mckenzie/2020 0936   ALKPHOS 112 08/19/2017 1411   BILITOT 0.4 05/Mckenzie/2020 0936   BILITOT 0.4 02/01/2018 0911   BILITOT 0.41 08/19/2017 1411   GFRNONAA 60 (L) 05/Mckenzie/2020 0936   GFRNONAA >60 02/01/2018 0911   GFRAA >60 05/Mckenzie/2020 0936   GFRAA >60 02/01/2018 0911    INo results found for: SPEP, UPEP  Lab Results  Component Value Date   WBC 8.3 05/Mckenzie/2020   NEUTROABS 5.9 05/Mckenzie/2020   HGB 15.2 (H) 05/Mckenzie/2020   HCT 48.1 (H) 05/Mckenzie/2020   MCV 90.8 05/Mckenzie/2020   PLT 178 05/Mckenzie/2020      Chemistry      Component Value Date/Time   NA 133 (L) 05/Mckenzie/2020 0936   NA 136 08/19/2017 1411   K 4.6 05/Mckenzie/2020 0936   K 4.5  08/19/2017 1411   CL 96 (L) 05/Mckenzie/2020 0936   CO2 30 05/Mckenzie/2020 0936   CO2 30 (H) 08/19/2017 1411   BUN 11 05/Mckenzie/2020 0936   BUN 14.7 08/19/2017 1411   CREATININE 0.94 05/Mckenzie/2020 0936   CREATININE 0.84 02/01/2018 0911   CREATININE 1.0 08/19/2017 1411       Component Value Date/Time   CALCIUM 9.4 05/Mckenzie/2020 0936   CALCIUM 9.2 08/19/2017 1411   ALKPHOS 115 05/Mckenzie/2020 0936   ALKPHOS 112 08/19/2017 1411   AST 16 05/Mckenzie/2020 0936   AST 15 02/01/2018 0911   AST 18 08/19/2017 1411   ALT 14 05/Mckenzie/2020 0936   ALT 12 02/01/2018 0911   ALT 14 08/19/2017 1411   BILITOT 0.4 05/Mckenzie/2020 0936   BILITOT 0.4 02/01/2018 0911   BILITOT 0.41 08/19/2017 1411       No results found for: LABCA2  No components found for: LABCA125  No results for input(s): INR in the last 168 hours.  Urinalysis No results found for: COLORURINE, APPEARANCEUR, LABSPEC, PHURINE, GLUCOSEU, HGBUR, BILIRUBINUR, KETONESUR, PROTEINUR, UROBILINOGEN, NITRITE, LEUKOCYTESUR    ELIGIBLE FOR AVAILABLE RESEARCH PROTOCOL: No  STUDIES: No results found.  ASSESSMENT: 73 y.o. [place] woman  LUNG CANCER: (1) hypermetabolic right upper lobe lung mass noted on chest CT scan 09/11/2015, clinically T1a N0  (a) biopsy by video bronchoscopy 11/13/2015 nondiagnostic  (b) definitive stereotactic radiosurgery Completed 01/21/2016  (c) follow up: most recent chest CT scan 02/01/2018 shows no evidence of progressive or recurrent disease  BREAST CANCER: (2) biopsy of a left upper outer quadrant breast mass 10/28/2015 showed a clinical T1c N0, stage IA invasive ductal carcinoma, grade 2, estrogen and progesterone strongly positive, HER-2 negative, with an MIB-1 of 6%.  (3) status post left lumpectomy and sentinel lymph node sampling 11/19/2015 for apT1c pN0, stage IA invasive ductal carcinoma, grade 2, repeat HER-2 again negative, with a focally positive inferior margin  (a) additional surgery for margin clearance  12/03/2015   (4) Adjuvant radiation:  01/08/16 - 01/30/16: Left Breast, 42.720 Gy in 16 fractions  01/14/16, 01/17/16, 01/21/16: Right Upper Lung, 54 Gy in 3 fractions  (5) started anastrozole 02/18/2016   (a) bone density 10/28/2016 shows a T score of -2.9.   (b) on ibandronate as  of July 2017, switch to alendronate April 2018 because of cost issues    PLAN: Denise Mckenzie is doing well today.  She is without any clinical signs of lung cancer or breast cancer recurrence.  She will undergo her mammogram and bone density in July, which was delayed due to [place] pandemic.  She also would like to postpone her CT chest which is due in June out until August.    Denise Mckenzie is tolerating the Anastrozole well.  She will continue this for at least five years, which will be until 02/2022.  We reviewed that women who are not adherence or discontinue early do not have as good of outcomes as women who take their medication regularly for the total 5 years.  She understands this and is very compliant in taking it.    Denise Mckenzie has a fungal rash underneath her breasts.  I sent in Ketoconazole cream for her to apply and reviewed this with her in detail. I also reviewed her normal CBC, and CMET was pending at time of appointment completion.  Denise Mckenzie and I reviewed health promotion.  We reviewed healthy diet tips, and I recommended she slowly increase her exercise from 90 minutes per week to 150 minutes per week.  She notes she will work on this.  She is up to date with her other cancer screenings, and sees her PCP for this.  She will see them again in September.    Denise Mckenzie and I also discussed her bone health.  She has osteoporosis and is taking Fosamax weekly.  She will undergo repeat bone density testing in 02/2019.  She and I discussed calcium, vitamin d, and weight bearing exercises.  I gave her a handout in her AVS that details all of this information.    Since all of her testing has been delayed due to the [place] pandemic, she will return in 06/2019 to follow up with Dr. Jana Hakim, review her mammograms, and CT chest.  At that point she can transition back to annual visits.  Jeyli knows to call us if she has any questions or concerns arise between now and her next appointment.    A total of (30) minutes of  face-to-face time was spent with this patient with greater than 50% of that time in counseling and care-coordination.   Wilber Bihari, NP  05/Mckenzie/20 10:10 AM Medical Oncology and Hematology [facility] [address] [place], [place] [address] Tel. [phone]    Fax. [phone]

## 2019-01-03 NOTE — Patient Instructions (Signed)

## 2019-01-04 ENCOUNTER — Other Ambulatory Visit: Payer: Medicare Other

## 2019-02-27 ENCOUNTER — Ambulatory Visit
Admission: RE | Admit: 2019-02-27 | Discharge: 2019-02-27 | Disposition: A | Discharge status: Home / Self Care | Payer: Medicare Other | Source: Ambulatory Visit | Attending: Oncology | Admitting: Oncology

## 2019-02-27 ENCOUNTER — Other Ambulatory Visit: Payer: Self-pay

## 2019-02-27 DIAGNOSIS — Z170 Estrogen receptor positive status [ER+]: Secondary | ICD-10-CM

## 2019-02-27 DIAGNOSIS — C3411 Malignant neoplasm of upper lobe, right bronchus or lung: Secondary | ICD-10-CM

## 2019-02-27 DIAGNOSIS — Z853 Personal history of malignant neoplasm of breast: Secondary | ICD-10-CM

## 2019-02-27 DIAGNOSIS — C50412 Malignant neoplasm of upper-outer quadrant of left female breast: Secondary | ICD-10-CM

## 2019-02-27 DIAGNOSIS — E119 Type 2 diabetes mellitus without complications: Secondary | ICD-10-CM

## 2019-02-27 DIAGNOSIS — K760 Fatty (change of) liver, not elsewhere classified: Secondary | ICD-10-CM

## 2019-02-27 DIAGNOSIS — I700 Atherosclerosis of aorta: Secondary | ICD-10-CM

## 2019-03-22 ENCOUNTER — Other Ambulatory Visit: Payer: Self-pay | Admitting: Adult Health

## 2019-03-22 DIAGNOSIS — Z170 Estrogen receptor positive status [ER+]: Secondary | ICD-10-CM

## 2019-03-22 DIAGNOSIS — C3411 Malignant neoplasm of upper lobe, right bronchus or lung: Secondary | ICD-10-CM

## 2019-03-22 DIAGNOSIS — C50412 Malignant neoplasm of upper-outer quadrant of left female breast: Secondary | ICD-10-CM

## 2019-03-22 NOTE — Progress Notes (Signed)
I

## 2019-04-04 ENCOUNTER — Other Ambulatory Visit: Payer: Self-pay

## 2019-04-04 ENCOUNTER — Ambulatory Visit (HOSPITAL_COMMUNITY)
Admission: RE | Admit: 2019-04-04 | Discharge: 2019-04-04 | Disposition: A | Discharge status: Home / Self Care | Payer: Medicare Other | Source: Ambulatory Visit | Attending: Adult Health | Admitting: Adult Health

## 2019-04-04 ENCOUNTER — Encounter (HOSPITAL_COMMUNITY): Payer: Self-pay

## 2019-04-04 DIAGNOSIS — C3411 Malignant neoplasm of upper lobe, right bronchus or lung: Secondary | ICD-10-CM | POA: Diagnosis not present

## 2019-04-04 LAB — POCT I-STAT CREATININE: Creatinine, Ser: 0.8 mg/dL (ref 0.44–1.00)

## 2019-04-04 MED ORDER — IOHEXOL 300 MG/ML  SOLN
75.0000 mL | Freq: Once | INTRAMUSCULAR | Status: AC | PRN
  Administered 2019-04-04: 75 mL via INTRAVENOUS

## 2019-04-04 MED ORDER — SODIUM CHLORIDE (PF) 0.9 % IJ SOLN
INTRAMUSCULAR | Status: AC
  Filled 2019-04-04: qty 50

## 2019-04-20 ENCOUNTER — Other Ambulatory Visit: Payer: Self-pay | Admitting: Oncology

## 2019-07-05 NOTE — Progress Notes (Signed)
[facility]  [phone]) [phone] [phone]) [phone]     ID: Denise Mckenzie DOB: 07-Feb-1946  MR#: [id number]  DDU#:[id number]  Patient Care Team: Denise Dy, MD as PCP - General (Internal Medicine) Denise Isaac, MD as Consulting Physician (Cardiothoracic Surgery) Denise Mesa, MD as Consulting Physician (General Surgery) Denise Silversmith, MD as Consulting Physician (Radiation Oncology) , Denise Dad, MD as Consulting Physician (Oncology) Denise Silversmith, MD as Consulting Physician (Radiation Oncology) Denise Bears, MD as Consulting Physician (Oncology) OTHER MD:  CHIEF COMPLAINT: Lung cancer, breast cancer  CURRENT TREATMENT: Anastrozole, alendronate (Fosamax)   INTERVAL HISTORY: Denise Mckenzie returns today for a follow-up of her estrogen receptor positive breast cancer as well as follow-up of her lung cancer.   She continues on anastrozole.  She tolerates this well.  She does have arthralgias, but she tells me she had these before and she really cannot tell the difference from before and after starting the anastrozole  Since her last visit, she underwent bilateral diagnostic mammography with tomography at [facility] on 02/27/2019 showing: breast density category B; no evidence of malignancy in either breast.  She also underwent bone density screening on the same day. This showed a T-score of -2.7, which is considered osteoporotic.  It is however improved as compared to 2 years prior  She also underwent chest CT on 04/04/2019, which showed a stable exam, no new or progressive findings to suggest recurrent or metastatic disease.   REVIEW OF SYSTEMS: Denise Mckenzie tells me her daughter-in-law died in 11-04-2022 of this year from pancreatic cancer after a 3-year fight.  It has been devastating for her son and her 2 grandchildren who are in their early 54s.  She has stepped up to the plate and is providing other family a lot of support.  She also has a treadmill  at home that she uses for exercise and on the day she goes outside and walks.  Her husband Denise Mckenzie is retired and also is very careful.  They are keeping strict pandemic precautions  A detailed review of systems today was otherwise noncontributory   BREAST AND LUNG CANCER PRESENTING HISTORY:  LUNG CANCER From the earlier summary report:  Denise Mckenzie developed some cough early January 2017 and brought it to her primary care physician's attention. A chest x-ray was obtained 09/01/2014. This showed a vague lingular density measuring 1.4 cm. Further evaluation with a CT scan of the chest obtained 09/11/2015 found a 1.1 cm right upper lobe nodule. The area in the lingular density was felt to be scarring. There were other scattered nodules too small to characterize. There was no adenopathy and no bony lesions.  Accordingly on 10/11/2015 the patient underwent PET scan. The right upper lobe lung lesion was hypermetabolic, with an SUV max of 24.8. It measured 1.4 cm on the PET scan. At least for small pulmonary nodules were also again noted, the largest measuring 4 mm, all below PET resolution. There were felt to be likely benign. Incidentally a hypermetabolic 1 cm soft tissue nodule in the left breast was noted, with an SUV max of 4.1. A right adrenal mass measuring 2.5 cm was not hypermetabolic, consistent with a benign adenoma. CT scan of the head with and without contrast 10/22/2015 was benign  The patient's case was presented at the lung cancer conference. Denise Mckenzie comments that the patient's diffusion capacity was 28% and the FEV 1 was 47% of predicted, limiting surgical choices. Accordingly on 11/13/2015 the patient underwent video bronchoscopy with endobronchial navigation  and right lung biopsy with marker placement The pathology from this procedure ([id number]) showed only blood clot and scant mucosal tissue. It was not diagnostic that it appears to be clearly discordant given the earlier PET results. The  patient completed definitive surgery for the lung lesion on 01/21/2016  BREAST CANCER From the initial intake note:  The incidentally noted left breast mass was further evaluated with bilateral diagnostic mammography with tomography and [facility]. This found a breast density to be category be. In the upper-outer quadrant there was a 1.0 cm spiculated mass which was not palpable. Ultrasonography confirmed a spiculated hypoechoic mass at the 2:00 position of the left breast, 3 cm from the nipple, measuring 1.0 cm. In the left axilla there was a 1.2 cm lymph node with a prominent cortex.  Biopsy of the left breast mass in question 11/04/2015 showed ([id number]) invasive ductal carcinoma, grade 2, estrogen receptor 100% positive, progesterone receptor 100% positive, both with strong staining intensity, with an MIB-1 of 6%, and no HER-2 amplification, the signals ratio being 1.32 and the number per cell 3.10. The suspicious left axillary lymph node was biopsied at the same time and it showed no evidence of carcinoma. This was felt to be concordant.  On 11/19/2015 Denise Mckenzie underwent left lumpectomy with sentinel lymph node sampling. The final pathology ([id number]) confirmed an invasive ductal carcinoma, grade 2, measuring 1.2 cm. Invasive disease was focally present at the inferior margin. In situ disease was less than 0.1 cm to the inferior margin as well. All 4 sentinel lymph nodes were clear. Repeat HER-2 was again negative with a signals ratio of 1.43, the number per cell being 3.50.  Her subsequent history is as detailed below.   PAST MEDICAL HISTORY:A Past Medical History:  Diagnosis Date  . Arthritis   . Breast cancer ([place]) dx'd 09/2015  . COPD (chronic obstructive pulmonary disease) ([place])   . Coronary artery disease   . Depression   . Diabetes mellitus ([place]) 10/22/2015  . Full dentures    full upper dentures, plate on bottom  . History of  bronchitis   . History of pneumonia   . Hyperlipidemia   . Hypertension   . Hypothyroidism 10/22/2015  . lung ca dx;d 09/2015   lung cancer  . Myocardial infarction [facility]) July 1995  . Osteoporosis 10/22/2015  . Personal history of radiation therapy   . Stress incontinence   . Uterine cancer ([place]) dx'd 1968    PAST SURGICAL HISTORY: Past Surgical History:  Procedure Laterality Date  . ABDOMINAL HYSTERECTOMY     1968  . APPENDECTOMY     with hysterectomy  . [place]  . BREAST BIOPSY Left 11/04/2015   Malignant  . BREAST BIOPSY Left 11/04/2015   Malignant  . BREAST LUMPECTOMY Left 11/19/2015  . CARDIAC CATHETERIZATION  1995, 1996   Dr. Wynonia Lawman saw here then here at [facility] both times; 'they did a balloon and opened it up' first time, the 2nd was scheduled d/t abnormal stress test  . COLONOSCOPY W/ POLYPECTOMY    . CORONARY ARTERY BYPASS GRAFT     11/1994  . 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 LUNG BIOPSY N/A 11/13/2015   Procedure: RIGHT LUNG BIOPSY;  Surgeon: Denise Isaac, MD;  Location: [facility];  Service: Thoracic;  Laterality: N/A;  . MULTIPLE TOOTH EXTRACTIONS    .  RADIOACTIVE SEED GUIDED PARTIAL MASTECTOMY WITH AXILLARY SENTINEL LYMPH NODE BIOPSY Left 11/19/2015   Procedure: RADIOACTIVE SEED GUIDED PARTIAL MASTECTOMY WITH AXILLARY SENTINEL LYMPH NODE BIOPSY;  Surgeon: Denise Mesa, MD;  Location: [facility];  Service: General;  Laterality: Left;  . RE-EXCISION OF BREAST LUMPECTOMY Left 12/03/2015   Procedure: RE-EXCISION INFERIOR MARGIN OF LEFT BREAST LUMPECTOMY;  Surgeon: Denise Mesa, MD;  Location: [facility];  Service: General;  Laterality: Left;  . TMJ ARTHROPLASTY     1979  . TONSILLECTOMY    . VIDEO BRONCHOSCOPY WITH ENDOBRONCHIAL NAVIGATION N/A 11/13/2015   Procedure: VIDEO BRONCHOSCOPY WITH ENDOBRONCHIAL NAVIGATION, with placement of fudicial markers;  Surgeon: Denise Isaac, MD;  Location: [facility] OR;   Service: Thoracic;  Laterality: N/A;    FAMILY HISTORY Family History  Problem Relation Age of Onset  . Alzheimer's disease Mother   . Heart attack Father   . Breast cancer Neg Hx   The patient's father died from a heart attack at age 52. The patient's mother died with Alzheimer's disease at age 36. Maile has one brother and one sister. The only cancer in the family was breast cancer in a maternal aunt diagnosed in her early 40s.   GYNECOLOGIC HISTORY:  No LMP recorded. Patient has had a hysterectomy. Menarche age 32, first live birth age 57. The patient is GX P2. She underwent total abdominal hysterectomy with bilateral salpingo-oophorectomy 1968. She did not take hormone replacement.   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 Marya Amsler lives in [place]. The patient's daughter Sherri Rad lives in [place] and works in Programmer, applications. The patient has 3 grandchildren and 2 great-grandchildren. Her youngest grandchild graduated from high school June 2017. She attends a [employer] in [place]: In place   HEALTH MAINTENANCE: Social History   Tobacco Use  . Smoking status: Former Smoker    Quit date: 08/17/1994    Years since quitting: 24.9  . Smokeless tobacco: Never Used  Substance Use Topics  . Alcohol use: No  . Drug use: No     Colonoscopy:2010/[facility]  PAP: 2015  Bone density: 2016/[facility]  Lipid panel:  No Known Allergies  Current Outpatient Medications  Medication Sig Dispense Refill  . acetaminophen (TYLENOL) 500 MG tablet Take 1,000 mg by mouth 2 (two) times daily as needed (pain). Reported on 01/20/2016    . alendronate (FOSAMAX) 70 MG tablet TAKE 1 TABLET BY MOUTH ONCE A WEEK. TAKE WITH FULL GLASS OF WATER ON EMPTY STOMACH. 12 tablet 3  . amitriptyline (ELAVIL) 50 MG tablet Take 50 mg by mouth at  bedtime.      Marland Kitchen anastrozole (ARIMIDEX) 1 MG tablet TAKE 1 TABLET BY MOUTH EVERY DAY 90 tablet 3  . aspirin EC 325 MG tablet Take 325 mg by mouth daily.    Marland Kitchen atorvastatin (LIPITOR) 40 MG tablet Take 40 mg by mouth at bedtime.     . Calcium Citrate-Vitamin D (CALCIUM + D PO) Take 2 tablets by mouth 3 (three) times daily. Each tablet Calcium 300 mg, Vitamin D3 250 I.U.    . ketoconazole (NIZORAL) 2 % cream Apply 1 application topically daily. 15 g 0  . Krill Oil 1000 MG CAPS Take 1,000 mg by mouth daily.    Marland Kitchen levothyroxine (SYNTHROID, LEVOTHROID) 88 MCG tablet Take 88 mcg by  mouth daily before breakfast.     . losartan-hydrochlorothiazide (HYZAAR) 100-12.5 MG per tablet Take 1 tablet by mouth daily.      . metFORMIN (GLUCOPHAGE) 500 MG tablet Take 500 mg by mouth daily.  4  . metoprolol (LOPRESSOR) 50 MG tablet Take 25 mg by mouth 2 (two) times daily. Reported on 01/28/2016    . nitroGLYCERIN (NITROSTAT) 0.4 MG SL tablet Place 0.4 mg under the tongue every 5 (five) minutes as needed for chest pain. Reported on 01/28/2016     No current facility-administered medications for this visit.     OBJECTIVE: Older white woman in no acute distress  Vitals:   07/06/19 0930  BP: (!) 142/77  Pulse: 79  Resp: 18  Temp: 97.9 F (36.6 C)  SpO2: 94%     Body mass index is 30.2 kg/m.    ECOG FS:1 - Symptomatic but completely ambulatory   Sclerae unicteric, EOMs intact Wearing a mask No cervical or supraclavicular adenopathy Lungs no rales or rhonchi Heart regular rate and rhythm Abd soft, nontender, positive bowel sounds MSK no focal spinal tenderness, no upper extremity lymphedema Neuro: nonfocal, well oriented, appropriate affect Breasts: The right breast is unremarkable.  The left breast is status post lumpectomy and radiation.  There is no evidence of local recurrence.  Both axillae are benign.   LAB RESULTS:  CMP     Component Value Date/Time   NA 134 (L) 07/06/2019 0925   NA 136  08/19/2017 1411   K 4.8 07/06/2019 0925   K 4.5 08/19/2017 1411   CL 94 (L) 07/06/2019 0925   CO2 32 07/06/2019 0925   CO2 30 (H) 08/19/2017 1411   GLUCOSE 98 07/06/2019 0925   GLUCOSE 91 08/19/2017 1411   BUN 10 07/06/2019 0925   BUN 14.7 08/19/2017 1411   CREATININE 0.85 07/06/2019 0925   CREATININE 1.0 08/19/2017 1411   CALCIUM 9.4 07/06/2019 0925   CALCIUM 9.2 08/19/2017 1411   PROT 7.6 07/06/2019 0925   PROT 6.8 08/19/2017 1411   ALBUMIN 3.4 (L) 07/06/2019 0925   ALBUMIN 3.4 (L) 08/19/2017 1411   AST 18 07/06/2019 0925   AST 18 08/19/2017 1411   ALT 18 07/06/2019 0925   ALT 14 08/19/2017 1411   ALKPHOS 115 07/06/2019 0925   ALKPHOS 112 08/19/2017 1411   BILITOT 0.4 07/06/2019 0925   BILITOT 0.41 08/19/2017 1411   GFRNONAA >60 07/06/2019 0925   GFRAA >60 07/06/2019 0925    INo results found for: SPEP, UPEP  Lab Results  Component Value Date   WBC 10.3 07/06/2019   NEUTROABS 7.5 07/06/2019   HGB 14.5 07/06/2019   HCT 45.1 07/06/2019   MCV 90.2 07/06/2019   PLT 224 07/06/2019      Chemistry      Component Value Date/Time   NA 134 (L) 07/06/2019 0925   NA 136 08/19/2017 1411   K 4.8 07/06/2019 0925   K 4.5 08/19/2017 1411   CL 94 (L) 07/06/2019 0925   CO2 32 07/06/2019 0925   CO2 30 (H) 08/19/2017 1411   BUN 10 07/06/2019 0925   BUN 14.7 08/19/2017 1411   CREATININE 0.85 07/06/2019 0925   CREATININE 1.0 08/19/2017 1411      Component Value Date/Time   CALCIUM 9.4 07/06/2019 0925   CALCIUM 9.2 08/19/2017 1411   ALKPHOS 115 07/06/2019 0925   ALKPHOS 112 08/19/2017 1411   AST 18 07/06/2019 0925   AST 18 08/19/2017 1411   ALT 18 07/06/2019  0925   ALT 14 08/19/2017 1411   BILITOT 0.4 07/06/2019 0925   BILITOT 0.41 08/19/2017 1411       No results found for: LABCA2  No components found for: LABCA125  No results for input(s): INR in the last 168 hours.  Urinalysis No results found for: COLORURINE, APPEARANCEUR, LABSPEC, PHURINE, GLUCOSEU,  HGBUR, BILIRUBINUR, KETONESUR, PROTEINUR, UROBILINOGEN, NITRITE, LEUKOCYTESUR   ELIGIBLE FOR AVAILABLE RESEARCH PROTOCOL: No  STUDIES: No results found.    ASSESSMENT: 73 y.o. [place] woman  LUNG CANCER: (1) hypermetabolic right upper lobe lung mass noted on chest CT scan 09/11/2015, clinically T1a N0  (a) biopsy by video bronchoscopy 11/13/2015 nondiagnostic  (b) definitive stereotactic radiosurgery Completed 01/21/2016  (c) follow up: most recent chest CT scan 02/01/2018 shows no evidence of progressive or recurrent disease  BREAST CANCER: (2) biopsy of a left upper outer quadrant breast mass 10/28/2015 showed a clinical T1c N0, stage IA invasive ductal carcinoma, grade 2, estrogen and progesterone strongly positive, HER-2 negative, with an MIB-1 of 6%.  (3) status post left lumpectomy and sentinel lymph node sampling 11/19/2015 for apT1c pN0, stage IA invasive ductal carcinoma, grade 2, repeat HER-2 again negative, with a focally positive inferior margin  (a) additional surgery for margin clearance  12/03/2015   (4) Adjuvant radiation:  01/08/16 - 01/30/16: Left Breast, 42.720 Gy in 16 fractions  01/14/16, 01/17/16, 01/21/16: Right Upper Lung, 54 Gy in 3 fractions  (5) started anastrozole 02/18/2016   (a) bone density 10/28/2016 shows a T score of -2.9.   (b) on ibandronate as of July 2017, switch to alendronate April 2018 because of cost issues  (c) repeat bone density 02/27/2019 shows a T score of -2.7 (stable).    PLAN: Fonda is now 3 and half years out from definitive surgery for her breast cancer with no evidence of disease recurrence..  She is tolerating anastrozole well and the plan will be to continue that a total of 5 years.  We reviewed her bone density which shows stability/improvement.  She is continuing the alendronate which she is tolerating well.  I commended her on her walking program.  She will have mammography again next July and a CT of the chest next  August.  She will see me again next October.  She knows to call for any other issue that may develop before that visit.  Denise Mckenzie. , MD  07/06/19 10:06 AM Medical Oncology and Hematology [facility] [place], [place] [address] Tel. [phone]    Fax. [phone]   I, Wilburn Mylar, am acting as scribe for Dr. Virgie Mckenzie. .  I, Lurline Del MD, have reviewed the above documentation for accuracy and completeness, and I agree with the above.

## 2019-07-06 ENCOUNTER — Telehealth: Payer: Self-pay | Admitting: Oncology

## 2019-07-06 ENCOUNTER — Other Ambulatory Visit: Payer: Self-pay | Admitting: *Deleted

## 2019-07-06 ENCOUNTER — Other Ambulatory Visit: Payer: Self-pay

## 2019-07-06 ENCOUNTER — Inpatient Hospital Stay: Payer: Medicare Other | Attending: Oncology

## 2019-07-06 ENCOUNTER — Inpatient Hospital Stay (HOSPITAL_BASED_OUTPATIENT_CLINIC_OR_DEPARTMENT_OTHER): Payer: Medicare Other | Admitting: Oncology

## 2019-07-06 VITALS — BP 142/77 | HR 79 | Temp 97.9°F | Resp 18 | Ht 62.0 in | Wt 165.1 lb

## 2019-07-06 DIAGNOSIS — F329 Major depressive disorder, single episode, unspecified: Secondary | ICD-10-CM | POA: Insufficient documentation

## 2019-07-06 DIAGNOSIS — C3411 Malignant neoplasm of upper lobe, right bronchus or lung: Secondary | ICD-10-CM

## 2019-07-06 DIAGNOSIS — Z79811 Long term (current) use of aromatase inhibitors: Secondary | ICD-10-CM | POA: Diagnosis not present

## 2019-07-06 DIAGNOSIS — Z79899 Other long term (current) drug therapy: Secondary | ICD-10-CM | POA: Insufficient documentation

## 2019-07-06 DIAGNOSIS — C50412 Malignant neoplasm of upper-outer quadrant of left female breast: Secondary | ICD-10-CM | POA: Insufficient documentation

## 2019-07-06 DIAGNOSIS — Z90722 Acquired absence of ovaries, bilateral: Secondary | ICD-10-CM | POA: Diagnosis not present

## 2019-07-06 DIAGNOSIS — E785 Hyperlipidemia, unspecified: Secondary | ICD-10-CM | POA: Diagnosis not present

## 2019-07-06 DIAGNOSIS — Z8249 Family history of ischemic heart disease and other diseases of the circulatory system: Secondary | ICD-10-CM | POA: Insufficient documentation

## 2019-07-06 DIAGNOSIS — E119 Type 2 diabetes mellitus without complications: Secondary | ICD-10-CM | POA: Insufficient documentation

## 2019-07-06 DIAGNOSIS — Z7982 Long term (current) use of aspirin: Secondary | ICD-10-CM | POA: Insufficient documentation

## 2019-07-06 DIAGNOSIS — Z90710 Acquired absence of both cervix and uterus: Secondary | ICD-10-CM | POA: Diagnosis not present

## 2019-07-06 DIAGNOSIS — Z87891 Personal history of nicotine dependence: Secondary | ICD-10-CM | POA: Insufficient documentation

## 2019-07-06 DIAGNOSIS — Z170 Estrogen receptor positive status [ER+]: Secondary | ICD-10-CM | POA: Insufficient documentation

## 2019-07-06 DIAGNOSIS — Z7984 Long term (current) use of oral hypoglycemic drugs: Secondary | ICD-10-CM | POA: Insufficient documentation

## 2019-07-06 DIAGNOSIS — Z85118 Personal history of other malignant neoplasm of bronchus and lung: Secondary | ICD-10-CM | POA: Insufficient documentation

## 2019-07-06 DIAGNOSIS — Z9079 Acquired absence of other genital organ(s): Secondary | ICD-10-CM | POA: Insufficient documentation

## 2019-07-06 DIAGNOSIS — Z923 Personal history of irradiation: Secondary | ICD-10-CM | POA: Diagnosis not present

## 2019-07-06 DIAGNOSIS — Z803 Family history of malignant neoplasm of breast: Secondary | ICD-10-CM | POA: Insufficient documentation

## 2019-07-06 DIAGNOSIS — I252 Old myocardial infarction: Secondary | ICD-10-CM | POA: Insufficient documentation

## 2019-07-06 DIAGNOSIS — E039 Hypothyroidism, unspecified: Secondary | ICD-10-CM | POA: Insufficient documentation

## 2019-07-06 DIAGNOSIS — J449 Chronic obstructive pulmonary disease, unspecified: Secondary | ICD-10-CM | POA: Diagnosis not present

## 2019-07-06 DIAGNOSIS — M818 Other osteoporosis without current pathological fracture: Secondary | ICD-10-CM | POA: Diagnosis not present

## 2019-07-06 DIAGNOSIS — I10 Essential (primary) hypertension: Secondary | ICD-10-CM | POA: Insufficient documentation

## 2019-07-06 LAB — CMP (CANCER CENTER ONLY)
ALT: 18 U/L (ref 0–44)
AST: 18 U/L (ref 15–41)
Albumin: 3.4 g/dL — ABNORMAL LOW (ref 3.5–5.0)
Alkaline Phosphatase: 115 U/L (ref 38–126)
Anion gap: 8 (ref 5–15)
BUN: 10 mg/dL (ref 8–23)
CO2: 32 mmol/L (ref 22–32)
Calcium: 9.4 mg/dL (ref 8.9–10.3)
Chloride: 94 mmol/L — ABNORMAL LOW (ref 98–111)
Creatinine: 0.85 mg/dL (ref 0.44–1.00)
GFR, Est AFR Am: >60 mL/min (ref >60)
GFR, Estimated: >60 mL/min (ref >60)
Glucose, Bld: 98 mg/dL (ref 70–99)
Potassium: 4.8 mmol/L (ref 3.5–5.1)
Sodium: 134 mmol/L — ABNORMAL LOW (ref 135–145)
Total Bilirubin: 0.4 mg/dL (ref 0.3–1.2)
Total Protein: 7.6 g/dL (ref 6.5–8.1)

## 2019-07-06 LAB — CBC WITH DIFFERENTIAL (CANCER CENTER ONLY)
Abs Immature Granulocytes: 0.04 10*3/uL (ref 0.00–0.07)
Basophils Absolute: 0.1 10*3/uL (ref 0.0–0.1)
Basophils Relative: 1 %
Eosinophils Absolute: 0.3 10*3/uL (ref 0.0–0.5)
Eosinophils Relative: 2 %
HCT: 45.1 % (ref 36.0–46.0)
Hemoglobin: 14.5 g/dL (ref 12.0–15.0)
Immature Granulocytes: 0 %
Lymphocytes Relative: 13 %
Lymphs Abs: 1.4 10*3/uL (ref 0.7–4.0)
MCH: 29 pg (ref 26.0–34.0)
MCHC: 32.2 g/dL (ref 30.0–36.0)
MCV: 90.2 fL (ref 80.0–100.0)
Monocytes Absolute: 1.1 10*3/uL — ABNORMAL HIGH (ref 0.1–1.0)
Monocytes Relative: 11 %
Neutro Abs: 7.5 10*3/uL (ref 1.7–7.7)
Neutrophils Relative %: 73 %
Platelet Count: 224 10*3/uL (ref 150–400)
RBC: 5 MIL/uL (ref 3.87–5.11)
RDW: 13.9 % (ref 11.5–15.5)
WBC Count: 10.3 10*3/uL (ref 4.0–10.5)
nRBC: 0 % (ref 0.0–0.2)

## 2019-07-06 NOTE — Telephone Encounter (Signed)
I talk with patient regarding schedule  

## 2019-07-18 ENCOUNTER — Other Ambulatory Visit: Payer: Self-pay

## 2019-07-18 DIAGNOSIS — Z20822 Contact with and (suspected) exposure to covid-19: Secondary | ICD-10-CM

## 2019-07-19 LAB — NOVEL CORONAVIRUS, NAA: SARS-CoV-2, NAA: NOT DETECTED

## 2019-09-13 ENCOUNTER — Ambulatory Visit: Payer: Medicare Other

## 2019-09-21 ENCOUNTER — Ambulatory Visit: Payer: Medicare Other | Attending: Internal Medicine

## 2019-09-21 DIAGNOSIS — Z23 Encounter for immunization: Secondary | ICD-10-CM | POA: Insufficient documentation

## 2019-09-21 NOTE — Progress Notes (Signed)
   Covid-19 Vaccination Clinic  Name:  Denise Mckenzie    MRN: [id number] DOB: 04-22-1946  09/21/2019  Denise Mckenzie was observed post Covid-19 immunization for 15 minutes without incidence. She was provided with Vaccine Information Sheet and instruction to access the V-Safe system.   Denise Mckenzie was instructed to call 911 with any severe reactions post vaccine: Marland Kitchen Difficulty breathing  . Swelling of your face and throat  . A fast heartbeat  . A bad rash all over your body  . Dizziness and weakness    Immunizations Administered    Name Date Dose VIS Date Route   Pfizer COVID-19 Vaccine 09/21/2019  1:50 PM 0.3 mL 07/28/2019 Intramuscular   Manufacturer: [place]   Lot: [id number]   [place]: [id number]

## 2019-09-22 ENCOUNTER — Ambulatory Visit: Payer: Medicare Other

## 2019-10-17 ENCOUNTER — Ambulatory Visit: Payer: Medicare Other | Attending: Internal Medicine

## 2019-10-17 DIAGNOSIS — Z23 Encounter for immunization: Secondary | ICD-10-CM

## 2019-10-17 NOTE — Progress Notes (Signed)
   Covid-19 Vaccination Clinic  Name:  Denise Mckenzie    MRN: [id number] DOB: 1946/02/26  10/17/2019  [name] was observed post Covid-19 immunization for 15 minutes without incident. She was provided with Vaccine Information Sheet and instruction to access the V-Safe system.   [name] was instructed to call 911 with any severe reactions post vaccine: Marland Kitchen Difficulty breathing  . Swelling of face and throat  . A fast heartbeat  . A bad rash all over body  . Dizziness and weakness   Immunizations Administered    Name Date Dose VIS Date Route   Pfizer COVID-19 Vaccine 10/17/2019  2:53 PM 0.3 mL 07/28/2019 Intramuscular   Manufacturer: [place]   Lot: [id number]   [place]: [id number]

## 2020-02-05 ENCOUNTER — Other Ambulatory Visit: Payer: Self-pay | Admitting: Oncology

## 2020-02-05 DIAGNOSIS — Z853 Personal history of malignant neoplasm of breast: Secondary | ICD-10-CM

## 2020-02-29 ENCOUNTER — Ambulatory Visit
Admission: RE | Admit: 2020-02-29 | Discharge: 2020-02-29 | Disposition: A | Discharge status: Home / Self Care | Payer: Medicare Other | Source: Ambulatory Visit | Attending: Oncology | Admitting: Oncology

## 2020-02-29 ENCOUNTER — Other Ambulatory Visit: Payer: Self-pay

## 2020-02-29 DIAGNOSIS — Z853 Personal history of malignant neoplasm of breast: Secondary | ICD-10-CM

## 2020-04-13 ENCOUNTER — Other Ambulatory Visit: Payer: Self-pay | Admitting: Oncology

## 2020-06-03 NOTE — Progress Notes (Signed)
[facility]  [phone] [phone]) [phone]     ID: SIMORA DINGEE DOB: [birth year]  MR#: [id number]  RKY#:[id number]  Patient Care Team: Lorene Dy, MD as PCP - General (Internal Medicine) Grace Isaac, MD as Consulting Physician (Cardiothoracic Surgery) Donnie Mesa, MD as Consulting Physician (General Surgery) Thea Silversmith, MD as Consulting Physician (Radiation Oncology) Janicia Monterrosa, Virgie Dad, MD as Consulting Physician (Oncology) Thea Silversmith, MD as Consulting Physician (Radiation Oncology) Curt Bears, MD as Consulting Physician (Oncology) OTHER MD:  CHIEF COMPLAINT: Lung cancer, breast cancer  CURRENT TREATMENT: Anastrozole, alendronate (Fosamax)   INTERVAL HISTORY: Jenille returns today for a follow-up of her estrogen receptor positive breast cancer as well as follow-up of her lung cancer.   She continues on anastrozole.  She has some hot flashes which she describes as moderate.  She is not interested in taking venlafaxine or other medications to deal with this.  Her most recent bone density screening from 02/27/2019 showed a T-score of -2.7, which is considered osteoporotic.  She tolerates alendronate without any side effects that she is aware of  Since her last visit, she underwent bilateral diagnostic mammography with tomography at [facility] on 02/29/2020 showing: breast density category B; no evidence of malignancy in either breast.    REVIEW OF SYSTEMS: Aahana did not have a CT of the chest this year for some reason.  She does have shortness of breath and finds that wearing a mask is very burdensome.  She got the [employer] vaccine x2.  She is having significant vertigo issues today even though she drove herself here.  She says this is a problem she has on and off at times.  Detailed review of systems today was otherwise stable   BREAST AND LUNG CANCER PRESENTING HISTORY:  LUNG CANCER From the earlier summary  report:  [name] developed some cough early January 2017 and brought it to her primary care physician's attention. A chest x-ray was obtained 09/01/2014. This showed a vague lingular density measuring 1.4 cm. Further evaluation with a CT scan of the chest obtained 09/11/2015 found a 1.1 cm right upper lobe nodule. The area in the lingular density was felt to be scarring. There were other scattered nodules too small to characterize. There was no adenopathy and no bony lesions.  Accordingly on 10/11/2015 the patient underwent PET scan. The right upper lobe lung lesion was hypermetabolic, with an SUV max of 24.8. It measured 1.4 cm on the PET scan. At least for small pulmonary nodules were also again noted, the largest measuring 4 mm, all below PET resolution. There were felt to be likely benign. Incidentally a hypermetabolic 1 cm soft tissue nodule in the left breast was noted, with an SUV max of 4.1. A right adrenal mass measuring 2.5 cm was not hypermetabolic, consistent with a benign adenoma. CT scan of the head with and without contrast 10/22/2015 was benign  The patient's case was presented at the lung cancer conference. Dr. Servando Snare comments that the patient's diffusion capacity was 28% and the FEV 1 was 47% of predicted, limiting surgical choices. Accordingly on 11/13/2015 the patient underwent video bronchoscopy with endobronchial navigation and right lung biopsy with marker placement The pathology from this procedure ([id number]) showed only blood clot and scant mucosal tissue. It was not diagnostic that it appears to be clearly discordant given the earlier PET results. The patient completed definitive surgery for the lung lesion on 01/21/2016  BREAST CANCER From the initial intake note:  The  incidentally noted left breast mass was further evaluated with bilateral diagnostic mammography with tomography and [facility]. This found a breast density to be  category be. In the upper-outer quadrant there was a 1.0 cm spiculated mass which was not palpable. Ultrasonography confirmed a spiculated hypoechoic mass at the 2:00 position of the left breast, 3 cm from the nipple, measuring 1.0 cm. In the left axilla there was a 1.2 cm lymph node with a prominent cortex.  Biopsy of the left breast mass in question 11/04/2015 showed ([id number]) invasive ductal carcinoma, grade 2, estrogen receptor 100% positive, progesterone receptor 100% positive, both with strong staining intensity, with an MIB-1 of 6%, and no HER-2 amplification, the signals ratio being 1.32 and the number per cell 3.10. The suspicious left axillary lymph node was biopsied at the same time and it showed no evidence of carcinoma. This was felt to be concordant.  On 11/19/2015 Izora Gala underwent left lumpectomy with sentinel lymph node sampling. The final pathology ([id number]) confirmed an invasive ductal carcinoma, grade 2, measuring 1.2 cm. Invasive disease was focally present at the inferior margin. In situ disease was less than 0.1 cm to the inferior margin as well. All 4 sentinel lymph nodes were clear. Repeat HER-2 was again negative with a signals ratio of 1.43, the number per cell being 3.50.  Her subsequent history is as detailed below.   PAST MEDICAL HISTORY:A Past Medical History:  Diagnosis Date  . Arthritis   . Breast cancer ([place]) dx'd 09/2015  . COPD (chronic obstructive pulmonary disease) ([place])   . Coronary artery disease   . Depression   . Diabetes mellitus ([place]) 10/22/2015  . Full dentures    full upper dentures, plate on bottom  . History of bronchitis   . History of pneumonia   . Hyperlipidemia   . Hypertension   . Hypothyroidism 10/22/2015  . lung ca dx;d 09/2015   lung cancer  . Myocardial infarction [facility]) July 1995  . Osteoporosis 10/22/2015  . Personal history of radiation therapy   . Stress incontinence   . Uterine cancer ([place]) dx'd 1968    PAST SURGICAL  HISTORY: Past Surgical History:  Procedure Laterality Date  . ABDOMINAL HYSTERECTOMY     1968  . APPENDECTOMY     with hysterectomy  . [place]  . BREAST BIOPSY Left 11/04/2015   Malignant  . BREAST BIOPSY Left 11/04/2015   Malignant  . BREAST LUMPECTOMY Left 11/19/2015  . CARDIAC CATHETERIZATION  1995, 1996   Dr. Wynonia Lawman saw here then here at [facility] both times; 'they did a balloon and opened it up' first time, the 2nd was scheduled d/t abnormal stress test  . COLONOSCOPY W/ POLYPECTOMY    . CORONARY ARTERY BYPASS GRAFT     11/1994  . 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 LUNG BIOPSY N/A 11/13/2015   Procedure: RIGHT LUNG BIOPSY;  Surgeon: Grace Isaac, MD;  Location: [facility];  Service: Thoracic;  Laterality: N/A;  . MULTIPLE TOOTH EXTRACTIONS    . RADIOACTIVE SEED GUIDED PARTIAL MASTECTOMY WITH AXILLARY SENTINEL LYMPH NODE BIOPSY Left 11/19/2015   Procedure: RADIOACTIVE SEED GUIDED PARTIAL MASTECTOMY WITH AXILLARY SENTINEL LYMPH NODE BIOPSY;  Surgeon: Donnie Mesa, MD;  Location: [facility];  Service: General;  Laterality: Left;  . RE-EXCISION OF BREAST LUMPECTOMY Left 12/03/2015   Procedure: RE-EXCISION INFERIOR  MARGIN OF LEFT BREAST LUMPECTOMY;  Surgeon: Donnie Mesa, MD;  Location: [facility];  Service: General;  Laterality: Left;  . TMJ ARTHROPLASTY     1979  . TONSILLECTOMY    . VIDEO BRONCHOSCOPY WITH ENDOBRONCHIAL NAVIGATION N/A 11/13/2015   Procedure: VIDEO BRONCHOSCOPY WITH ENDOBRONCHIAL NAVIGATION, with placement of fudicial markers;  Surgeon: Grace Isaac, MD;  Location: [facility] OR;  Service: Thoracic;  Laterality: N/A;    FAMILY HISTORY Family History  Problem Relation Age of Onset  . Alzheimer's disease Mother   . Heart attack Father   . Breast cancer Neg Hx   The patient's father died from a heart attack at age 55. The patient's mother died with Alzheimer's disease at age 46. Chelby has one brother and  one sister. The only cancer in the family was breast cancer in a maternal aunt diagnosed in her early 25s.   GYNECOLOGIC HISTORY:  No LMP recorded. Patient has had a hysterectomy. Menarche age 54, first live birth age 56. The patient is GX P2. She underwent total abdominal hysterectomy with bilateral salpingo-oophorectomy 1968. She did not take hormone replacement.   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 Marya Amsler lives in [place]. The patient's daughter Sherri Rad lives in [place] and works in Programmer, applications. The patient has 3 grandchildren and 2 great-grandchildren. Her youngest grandchild graduated from high school June 2017.  Belkys attends a [employer] in [place]: In place   HEALTH MAINTENANCE: Social History   Tobacco Use  . Smoking status: Former Smoker    Quit date: 08/17/1994    Years since quitting: 25.8  . Smokeless tobacco: Never Used  Substance Use Topics  . Alcohol use: No  . Drug use: No     Colonoscopy:2010/[facility]  PAP: 2015  Bone density: 2016/[facility]  Lipid panel:  No Known Allergies  Current Outpatient Medications  Medication Sig Dispense Refill  . acetaminophen (TYLENOL) 500 MG tablet Take 1,000 mg by mouth 2 (two) times daily as needed (pain). Reported on 01/20/2016    . alendronate (FOSAMAX) 70 MG tablet TAKE 1 TABLET BY MOUTH ONCE A WEEK. TAKE WITH FULL GLASS OF WATER ON EMPTY STOMACH. 12 tablet 3  . amitriptyline (ELAVIL) 50 MG tablet Take 50 mg by mouth at bedtime.      Marland Kitchen anastrozole (ARIMIDEX) 1 MG tablet TAKE 1 TABLET BY MOUTH EVERY DAY 90 tablet 3  . aspirin EC 325 MG tablet Take 325 mg by mouth daily.    Marland Kitchen atorvastatin (LIPITOR) 40 MG tablet Take 40 mg by mouth at bedtime.     . Calcium Citrate-Vitamin D (CALCIUM + D PO) Take 2 tablets by mouth 3 (three) times daily. Each  tablet Calcium 300 mg, Vitamin D3 250 I.U.    . ketoconazole (NIZORAL) 2 % cream Apply 1 application topically daily. 15 g 0  . Krill Oil 1000 MG CAPS Take 1,000 mg by mouth daily.    Marland Kitchen levothyroxine (SYNTHROID, LEVOTHROID) 88 MCG tablet Take 88 mcg by mouth daily before breakfast.     . losartan-hydrochlorothiazide (HYZAAR) 100-12.5 MG per tablet Take 1 tablet by mouth daily.      . metFORMIN (GLUCOPHAGE) 500 MG tablet Take 500 mg by mouth daily.  4  . metoprolol (LOPRESSOR) 50 MG tablet Take 25 mg by mouth 2 (two)  times daily. Reported on 01/28/2016    . nitroGLYCERIN (NITROSTAT) 0.4 MG SL tablet Place 0.4 mg under the tongue every 5 (five) minutes as needed for chest pain. Reported on 01/28/2016     No current facility-administered medications for this visit.    OBJECTIVE: White woman who appears stated age  26:   06/04/20 1015  BP: (!) 157/66  Pulse: 71  Resp: 16  Temp: 98.5 F (36.9 C)  SpO2: 95%     Body mass index is 30.36 kg/m.    ECOG FS:1 - Symptomatic but completely ambulatory   Sclerae unicteric, EOMs intact Wearing a mask No cervical or supraclavicular adenopathy Lungs no rales or rhonchi Heart regular rate and rhythm Abd soft, nontender, positive bowel sounds MSK no focal spinal tenderness, no upper extremity lymphedema Neuro: nonfocal, well oriented, appropriate affect Breasts: The right breast is benign per the left breast has undergone lumpectomy followed by radiation.  There is no evidence of local recurrence.  Both axillae are benign. Skin: Erythematous itchy rash under the left breast  LAB RESULTS:  CMP     Component Value Date/Time   NA 134 (L) 07/06/2019 0925   NA 136 08/19/2017 1411   K 4.8 07/06/2019 0925   K 4.5 08/19/2017 1411   CL 94 (L) 07/06/2019 0925   CO2 32 07/06/2019 0925   CO2 30 (H) 08/19/2017 1411   GLUCOSE 98 07/06/2019 0925   GLUCOSE 91 08/19/2017 1411   BUN 10 07/06/2019 0925   BUN 14.7 08/19/2017 1411   CREATININE 0.85  07/06/2019 0925   CREATININE 1.0 08/19/2017 1411   CALCIUM 9.4 07/06/2019 0925   CALCIUM 9.2 08/19/2017 1411   PROT 7.6 07/06/2019 0925   PROT 6.8 08/19/2017 1411   ALBUMIN 3.4 (L) 07/06/2019 0925   ALBUMIN 3.4 (L) 08/19/2017 1411   AST 18 07/06/2019 0925   AST 18 08/19/2017 1411   ALT 18 07/06/2019 0925   ALT 14 08/19/2017 1411   ALKPHOS 115 07/06/2019 0925   ALKPHOS 112 08/19/2017 1411   BILITOT 0.4 07/06/2019 0925   BILITOT 0.41 08/19/2017 1411   GFRNONAA >60 07/06/2019 0925   GFRAA >60 07/06/2019 0925    INo results found for: SPEP, UPEP  Lab Results  Component Value Date   WBC 8.8 06/04/2020   NEUTROABS 6.5 06/04/2020   HGB 16.0 (H) 06/04/2020   HCT 49.9 (H) 06/04/2020   MCV 95.8 06/04/2020   PLT 169 06/04/2020      Chemistry      Component Value Date/Time   NA 134 (L) 07/06/2019 0925   NA 136 08/19/2017 1411   K 4.8 07/06/2019 0925   K 4.5 08/19/2017 1411   CL 94 (L) 07/06/2019 0925   CO2 32 07/06/2019 0925   CO2 30 (H) 08/19/2017 1411   BUN 10 07/06/2019 0925   BUN 14.7 08/19/2017 1411   CREATININE 0.85 07/06/2019 0925   CREATININE 1.0 08/19/2017 1411      Component Value Date/Time   CALCIUM 9.4 07/06/2019 0925   CALCIUM 9.2 08/19/2017 1411   ALKPHOS 115 07/06/2019 0925   ALKPHOS 112 08/19/2017 1411   AST 18 07/06/2019 0925   AST 18 08/19/2017 1411   ALT 18 07/06/2019 0925   ALT 14 08/19/2017 1411   BILITOT 0.4 07/06/2019 0925   BILITOT 0.41 08/19/2017 1411       No results found for: LABCA2  No components found for: LABCA125  No results for input(s): INR in the last 168 hours.  Urinalysis No results found for: COLORURINE, APPEARANCEUR, LABSPEC, PHURINE, GLUCOSEU, HGBUR, BILIRUBINUR, KETONESUR, PROTEINUR, UROBILINOGEN, NITRITE, LEUKOCYTESUR   ELIGIBLE FOR AVAILABLE RESEARCH PROTOCOL: No  STUDIES: No results found.    ASSESSMENT: 74 y.o. [place] woman  LUNG CANCER: (1) hypermetabolic right upper lobe lung mass noted on chest CT  scan 09/11/2015, clinically T1a N0  (a) biopsy by video bronchoscopy 11/13/2015 nondiagnostic  (b) definitive stereotactic radiosurgery Completed 01/21/2016  (c) follow up: most recent chest CT scan 02/01/2018 shows no evidence of progressive or recurrent disease  (d) chest CT scan 04/04/2019 stable  BREAST CANCER: (2) biopsy of a left upper outer quadrant breast mass 10/28/2015 showed a clinical T1c N0, stage IA invasive ductal carcinoma, grade 2, estrogen and progesterone strongly positive, HER-2 negative, with an MIB-1 of 6%.  (3) status post left lumpectomy and sentinel lymph node sampling 11/19/2015 for apT1c pN0, stage IA invasive ductal carcinoma, grade 2, repeat HER-2 again negative, with a focally positive inferior margin  (a) additional surgery for margin clearance  12/03/2015   (4) Adjuvant radiation:  01/08/16 - 01/30/16: Left Breast, 42.720 Gy in 16 fractions  01/14/16, 01/17/16, 01/21/16: Right Upper Lung, 54 Gy in 3 fractions  (5) started anastrozole 02/18/2016   (a) bone density 10/28/2016 shows a T score of -2.9.   (b) on ibandronate as of July 2017, switch to alendronate April 2018 because of cost issues  (c) repeat bone density 02/27/2019 shows a T score of -2.7 (stable).    PLAN: Dinora is now 4-1/2 years out from definitive surgery for her breast cancer with no evidence of disease recurrence.  This is very favorable.  The plan is to continue anastrozole for a total of 5 years which means she can stop in July 2022.  She is also a little over 4 years status post stereotactic radiotherapy for her stage I lung cancer.  She does need a CT this year which I have ordered.  If that remains negative we may discontinue lung cancer follow-up or perhaps consider 1 more CT scan next year to make it around 5 years  I am referring her to physical therapy for her vertigo.  I am writing for [place] of for the inframammary yeast infection.  Total encounter time today 35  minutes.Sarajane Jews C. Rogina Schiano, MD  06/04/20 10:21 AM Medical Oncology and Hematology [facility] [place], [place] [address] Tel. [phone]    Fax. [phone]   I, Wilburn Mylar, am acting as scribe for Dr. Virgie Dad. Caasi Giglia.  I, Lurline Del MD, have reviewed the above documentation for accuracy and completeness, and I agree with the above.   *Total Encounter Time as defined by the Centers for Medicare and Medicaid Services includes, in addition to the face-to-face time of a patient visit (documented in the note above) non-face-to-face time: obtaining and reviewing outside history, ordering and reviewing medications, tests or procedures, care coordination (communications with other health care professionals or caregivers) and documentation in the medical record.

## 2020-06-04 ENCOUNTER — Inpatient Hospital Stay: Payer: Medicare Other | Admitting: Oncology

## 2020-06-04 ENCOUNTER — Other Ambulatory Visit: Payer: Self-pay

## 2020-06-04 ENCOUNTER — Inpatient Hospital Stay: Payer: Medicare Other | Attending: Oncology

## 2020-06-04 VITALS — BP 157/66 | HR 71 | Temp 98.5°F | Resp 16 | Ht 62.0 in | Wt 166.0 lb

## 2020-06-04 DIAGNOSIS — Z923 Personal history of irradiation: Secondary | ICD-10-CM | POA: Insufficient documentation

## 2020-06-04 DIAGNOSIS — Z170 Estrogen receptor positive status [ER+]: Secondary | ICD-10-CM

## 2020-06-04 DIAGNOSIS — Z87891 Personal history of nicotine dependence: Secondary | ICD-10-CM | POA: Diagnosis not present

## 2020-06-04 DIAGNOSIS — C3411 Malignant neoplasm of upper lobe, right bronchus or lung: Secondary | ICD-10-CM | POA: Diagnosis not present

## 2020-06-04 DIAGNOSIS — R42 Dizziness and giddiness: Secondary | ICD-10-CM | POA: Insufficient documentation

## 2020-06-04 DIAGNOSIS — C50412 Malignant neoplasm of upper-outer quadrant of left female breast: Secondary | ICD-10-CM

## 2020-06-04 DIAGNOSIS — B379 Candidiasis, unspecified: Secondary | ICD-10-CM | POA: Diagnosis not present

## 2020-06-04 LAB — CBC WITH DIFFERENTIAL (CANCER CENTER ONLY)
Abs Immature Granulocytes: 0.03 10*3/uL (ref 0.00–0.07)
Basophils Absolute: 0.1 10*3/uL (ref 0.0–0.1)
Basophils Relative: 1 %
Eosinophils Absolute: 0.2 10*3/uL (ref 0.0–0.5)
Eosinophils Relative: 2 %
HCT: 49.9 % — ABNORMAL HIGH (ref 36.0–46.0)
Hemoglobin: 16 g/dL — ABNORMAL HIGH (ref 12.0–15.0)
Immature Granulocytes: 0 %
Lymphocytes Relative: 14 %
Lymphs Abs: 1.3 10*3/uL (ref 0.7–4.0)
MCH: 30.7 pg (ref 26.0–34.0)
MCHC: 32.1 g/dL (ref 30.0–36.0)
MCV: 95.8 fL (ref 80.0–100.0)
Monocytes Absolute: 0.7 10*3/uL (ref 0.1–1.0)
Monocytes Relative: 8 %
Neutro Abs: 6.5 10*3/uL (ref 1.7–7.7)
Neutrophils Relative %: 75 %
Platelet Count: 169 10*3/uL (ref 150–400)
RBC: 5.21 MIL/uL — ABNORMAL HIGH (ref 3.87–5.11)
RDW: 13.8 % (ref 11.5–15.5)
WBC Count: 8.8 10*3/uL (ref 4.0–10.5)
nRBC: 0 % (ref 0.0–0.2)

## 2020-06-04 LAB — CMP (CANCER CENTER ONLY)
ALT: 12 U/L (ref 0–44)
AST: 16 U/L (ref 15–41)
Albumin: 3.5 g/dL (ref 3.5–5.0)
Alkaline Phosphatase: 103 U/L (ref 38–126)
Anion gap: 4 — ABNORMAL LOW (ref 5–15)
BUN: 10 mg/dL (ref 8–23)
CO2: 35 mmol/L — ABNORMAL HIGH (ref 22–32)
Calcium: 10.1 mg/dL (ref 8.9–10.3)
Chloride: 97 mmol/L — ABNORMAL LOW (ref 98–111)
Creatinine: 1 mg/dL (ref 0.44–1.00)
GFR, Estimated: 55 mL/min — ABNORMAL LOW (ref >60)
Glucose, Bld: 116 mg/dL — ABNORMAL HIGH (ref 70–99)
Potassium: 4.9 mmol/L (ref 3.5–5.1)
Sodium: 136 mmol/L (ref 135–145)
Total Bilirubin: 0.5 mg/dL (ref 0.3–1.2)
Total Protein: 7.2 g/dL (ref 6.5–8.1)

## 2020-06-04 MED ORDER — KETOCONAZOLE 2 % EX CREA: 1.0000 "application " | TOPICAL_CREAM | Freq: Every day | CUTANEOUS | 6 refills | Status: DC

## 2020-06-17 ENCOUNTER — Other Ambulatory Visit: Payer: Self-pay

## 2020-06-17 ENCOUNTER — Other Ambulatory Visit: Payer: Self-pay | Admitting: Oncology

## 2020-06-17 ENCOUNTER — Encounter (HOSPITAL_COMMUNITY): Payer: Self-pay

## 2020-06-17 ENCOUNTER — Ambulatory Visit (HOSPITAL_COMMUNITY)
Admission: RE | Admit: 2020-06-17 | Discharge: 2020-06-17 | Disposition: A | Discharge status: Home / Self Care | Payer: Medicare Other | Source: Ambulatory Visit | Attending: Oncology | Admitting: Oncology

## 2020-06-17 ENCOUNTER — Encounter: Payer: Self-pay | Admitting: Oncology

## 2020-06-17 DIAGNOSIS — C3411 Malignant neoplasm of upper lobe, right bronchus or lung: Secondary | ICD-10-CM

## 2020-06-17 DIAGNOSIS — Z170 Estrogen receptor positive status [ER+]: Secondary | ICD-10-CM | POA: Diagnosis present

## 2020-06-17 DIAGNOSIS — C50412 Malignant neoplasm of upper-outer quadrant of left female breast: Secondary | ICD-10-CM | POA: Diagnosis present

## 2020-06-17 MED ORDER — IOHEXOL 300 MG/ML  SOLN
75.0000 mL | Freq: Once | INTRAMUSCULAR | Status: AC | PRN
  Administered 2020-06-17: 75 mL via INTRAVENOUS

## 2020-06-17 NOTE — Progress Notes (Signed)
I called Denise Mckenzie and left a message letting h her know that there was a small change in the CT of the chest and we are going to repeat that test in 6 months after which she will see me again.

## 2020-06-18 ENCOUNTER — Telehealth: Payer: Self-pay | Admitting: Oncology

## 2020-06-18 NOTE — Telephone Encounter (Signed)
Scheduled appt per 11/1 sch msg - mailed reminder with appt date and time

## 2020-07-08 ENCOUNTER — Other Ambulatory Visit: Payer: Self-pay | Admitting: Oncology

## 2020-07-08 DIAGNOSIS — R42 Dizziness and giddiness: Secondary | ICD-10-CM | POA: Insufficient documentation

## 2020-12-24 ENCOUNTER — Encounter (HOSPITAL_COMMUNITY): Payer: Self-pay

## 2020-12-24 ENCOUNTER — Ambulatory Visit (HOSPITAL_COMMUNITY)
Admission: RE | Admit: 2020-12-24 | Discharge: 2020-12-24 | Disposition: A | Discharge status: Home / Self Care | Payer: Medicare Other | Source: Ambulatory Visit | Attending: Oncology | Admitting: Oncology

## 2020-12-24 ENCOUNTER — Other Ambulatory Visit: Payer: Self-pay

## 2020-12-24 DIAGNOSIS — C3411 Malignant neoplasm of upper lobe, right bronchus or lung: Secondary | ICD-10-CM | POA: Insufficient documentation

## 2020-12-24 LAB — POCT I-STAT CREATININE: Creatinine, Ser: 0.8 mg/dL (ref 0.44–1.00)

## 2020-12-24 MED ORDER — IOHEXOL 300 MG/ML  SOLN
75.0000 mL | Freq: Once | INTRAMUSCULAR | Status: AC | PRN
  Administered 2020-12-24: 75 mL via INTRAVENOUS

## 2020-12-24 MED ORDER — SODIUM CHLORIDE (PF) 0.9 % IJ SOLN
INTRAMUSCULAR | Status: AC
  Filled 2020-12-24: qty 50
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
## 2021-01-06 ENCOUNTER — Inpatient Hospital Stay: Payer: Medicare Other | Attending: Oncology | Admitting: Oncology

## 2021-01-06 ENCOUNTER — Other Ambulatory Visit: Payer: Self-pay | Admitting: *Deleted

## 2021-01-06 ENCOUNTER — Other Ambulatory Visit: Payer: Self-pay

## 2021-01-06 ENCOUNTER — Inpatient Hospital Stay: Payer: Medicare Other

## 2021-01-06 VITALS — BP 130/71 | HR 73 | Temp 97.7°F | Resp 18 | Ht 62.0 in | Wt 159.4 lb

## 2021-01-06 DIAGNOSIS — Z87891 Personal history of nicotine dependence: Secondary | ICD-10-CM | POA: Diagnosis not present

## 2021-01-06 DIAGNOSIS — Z170 Estrogen receptor positive status [ER+]: Secondary | ICD-10-CM

## 2021-01-06 DIAGNOSIS — Z803 Family history of malignant neoplasm of breast: Secondary | ICD-10-CM | POA: Insufficient documentation

## 2021-01-06 DIAGNOSIS — Z79811 Long term (current) use of aromatase inhibitors: Secondary | ICD-10-CM | POA: Diagnosis not present

## 2021-01-06 DIAGNOSIS — Z923 Personal history of irradiation: Secondary | ICD-10-CM | POA: Insufficient documentation

## 2021-01-06 DIAGNOSIS — C50412 Malignant neoplasm of upper-outer quadrant of left female breast: Secondary | ICD-10-CM | POA: Insufficient documentation

## 2021-01-06 DIAGNOSIS — R232 Flushing: Secondary | ICD-10-CM | POA: Diagnosis not present

## 2021-01-06 DIAGNOSIS — Z90722 Acquired absence of ovaries, bilateral: Secondary | ICD-10-CM | POA: Diagnosis not present

## 2021-01-06 DIAGNOSIS — C3411 Malignant neoplasm of upper lobe, right bronchus or lung: Secondary | ICD-10-CM | POA: Insufficient documentation

## 2021-01-06 DIAGNOSIS — M818 Other osteoporosis without current pathological fracture: Secondary | ICD-10-CM | POA: Diagnosis not present

## 2021-01-06 LAB — CBC WITH DIFFERENTIAL (CANCER CENTER ONLY)
Abs Immature Granulocytes: 0.05 10*3/uL (ref 0.00–0.07)
Basophils Absolute: 0.1 10*3/uL (ref 0.0–0.1)
Basophils Relative: 1 %
Eosinophils Absolute: 0.1 10*3/uL (ref 0.0–0.5)
Eosinophils Relative: 2 %
HCT: 50.9 % — ABNORMAL HIGH (ref 36.0–46.0)
Hemoglobin: 16.1 g/dL — ABNORMAL HIGH (ref 12.0–15.0)
Immature Granulocytes: 1 %
Lymphocytes Relative: 14 %
Lymphs Abs: 1.3 10*3/uL (ref 0.7–4.0)
MCH: 30 pg (ref 26.0–34.0)
MCHC: 31.6 g/dL (ref 30.0–36.0)
MCV: 94.8 fL (ref 80.0–100.0)
Monocytes Absolute: 0.7 10*3/uL (ref 0.1–1.0)
Monocytes Relative: 7 %
Neutro Abs: 7.3 10*3/uL (ref 1.7–7.7)
Neutrophils Relative %: 75 %
Platelet Count: 161 10*3/uL (ref 150–400)
RBC: 5.37 MIL/uL — ABNORMAL HIGH (ref 3.87–5.11)
RDW: 13.7 % (ref 11.5–15.5)
WBC Count: 9.6 10*3/uL (ref 4.0–10.5)
nRBC: 0 % (ref 0.0–0.2)

## 2021-01-06 LAB — CMP (CANCER CENTER ONLY)
ALT: 17 U/L (ref 0–44)
AST: 15 U/L (ref 15–41)
Albumin: 3.5 g/dL (ref 3.5–5.0)
Alkaline Phosphatase: 96 U/L (ref 38–126)
Anion gap: 6 (ref 5–15)
BUN: 9 mg/dL (ref 8–23)
CO2: 34 mmol/L — ABNORMAL HIGH (ref 22–32)
Calcium: 9.3 mg/dL (ref 8.9–10.3)
Chloride: 95 mmol/L — ABNORMAL LOW (ref 98–111)
Creatinine: 0.79 mg/dL (ref 0.44–1.00)
GFR, Estimated: >60 mL/min (ref >60)
Glucose, Bld: 91 mg/dL (ref 70–99)
Potassium: 4.8 mmol/L (ref 3.5–5.1)
Sodium: 135 mmol/L (ref 135–145)
Total Bilirubin: 0.7 mg/dL (ref 0.3–1.2)
Total Protein: 7 g/dL (ref 6.5–8.1)

## 2021-01-07 ENCOUNTER — Telehealth: Payer: Self-pay | Admitting: Oncology

## 2021-01-07 NOTE — Telephone Encounter (Signed)
Scheduled appointment per 05/23 los. Patient will receive updated calender.

## 2021-02-18 ENCOUNTER — Other Ambulatory Visit: Payer: Self-pay

## 2021-02-18 ENCOUNTER — Other Ambulatory Visit: Payer: Self-pay | Admitting: Internal Medicine

## 2021-02-18 ENCOUNTER — Ambulatory Visit
Admission: RE | Admit: 2021-02-18 | Discharge: 2021-02-18 | Disposition: A | Discharge status: Home / Self Care | Payer: Medicare Other | Source: Ambulatory Visit | Attending: Internal Medicine | Admitting: Internal Medicine

## 2021-02-18 DIAGNOSIS — R0602 Shortness of breath: Secondary | ICD-10-CM

## 2021-03-19 ENCOUNTER — Ambulatory Visit: Payer: Medicare Other

## 2021-03-20 ENCOUNTER — Inpatient Hospital Stay: Admission: RE | Admit: 2021-03-20 | Payer: Medicare Other | Source: Ambulatory Visit

## 2021-04-09 DIAGNOSIS — J449 Chronic obstructive pulmonary disease, unspecified: Secondary | ICD-10-CM | POA: Insufficient documentation

## 2021-04-09 DIAGNOSIS — F32A Depression, unspecified: Secondary | ICD-10-CM | POA: Insufficient documentation

## 2021-04-09 DIAGNOSIS — Z923 Personal history of irradiation: Secondary | ICD-10-CM | POA: Insufficient documentation

## 2021-04-09 DIAGNOSIS — C801 Malignant (primary) neoplasm, unspecified: Secondary | ICD-10-CM | POA: Insufficient documentation

## 2021-04-09 DIAGNOSIS — M1990 Unspecified osteoarthritis, unspecified site: Secondary | ICD-10-CM | POA: Insufficient documentation

## 2021-04-09 DIAGNOSIS — Z8701 Personal history of pneumonia (recurrent): Secondary | ICD-10-CM | POA: Insufficient documentation

## 2021-04-09 DIAGNOSIS — Z8709 Personal history of other diseases of the respiratory system: Secondary | ICD-10-CM | POA: Insufficient documentation

## 2021-04-09 DIAGNOSIS — N393 Stress incontinence (female) (male): Secondary | ICD-10-CM | POA: Insufficient documentation

## 2021-04-09 DIAGNOSIS — C55 Malignant neoplasm of uterus, part unspecified: Secondary | ICD-10-CM | POA: Insufficient documentation

## 2021-04-12 ENCOUNTER — Ambulatory Visit
Admission: RE | Admit: 2021-04-12 | Discharge: 2021-04-12 | Disposition: A | Discharge status: Home / Self Care | Payer: Medicare Other | Source: Ambulatory Visit | Attending: Oncology | Admitting: Oncology

## 2021-04-12 DIAGNOSIS — M818 Other osteoporosis without current pathological fracture: Secondary | ICD-10-CM

## 2021-04-12 DIAGNOSIS — C3411 Malignant neoplasm of upper lobe, right bronchus or lung: Secondary | ICD-10-CM

## 2021-04-12 DIAGNOSIS — C50412 Malignant neoplasm of upper-outer quadrant of left female breast: Secondary | ICD-10-CM

## 2021-04-16 ENCOUNTER — Encounter: Payer: Self-pay | Admitting: Cardiology

## 2021-04-16 ENCOUNTER — Ambulatory Visit: Payer: Medicare Other | Admitting: Cardiology

## 2021-04-16 ENCOUNTER — Other Ambulatory Visit: Payer: Self-pay

## 2021-04-16 VITALS — BP 130/74 | HR 80 | Ht 63.0 in | Wt 148.1 lb

## 2021-04-16 DIAGNOSIS — I25118 Atherosclerotic heart disease of native coronary artery with other forms of angina pectoris: Secondary | ICD-10-CM

## 2021-04-16 DIAGNOSIS — I509 Heart failure, unspecified: Secondary | ICD-10-CM | POA: Diagnosis not present

## 2021-04-16 DIAGNOSIS — I38 Endocarditis, valve unspecified: Secondary | ICD-10-CM

## 2021-04-16 DIAGNOSIS — E782 Mixed hyperlipidemia: Secondary | ICD-10-CM

## 2021-04-16 DIAGNOSIS — I10 Essential (primary) hypertension: Secondary | ICD-10-CM | POA: Diagnosis not present

## 2021-04-16 MED ORDER — ASPIRIN EC 81 MG PO TBEC: 81.0000 mg | DELAYED_RELEASE_TABLET | Freq: Every day | ORAL | 3 refills | Status: DC

## 2021-04-16 MED ORDER — DAPAGLIFLOZIN PROPANEDIOL 10 MG PO TABS: 10.0000 mg | ORAL_TABLET | Freq: Every day | ORAL | 3 refills | Status: DC

## 2021-04-16 NOTE — Patient Instructions (Signed)
Medication Instructions:  Your physician has recommended you make the following change in your medication:  START: Farxiga 10 mg take one tablet by mouth daily.  *If you need a refill on your cardiac medications before your next appointment, please call your pharmacy*   Lab Work: Your physician recommends that you return for lab work in: TODAY CMP, Lipids, ProBNP If you have labs (blood work) drawn today and your tests are completely normal, you will receive your results only by: [facility] (if you have MyChart) OR A paper copy in the mail If you have any lab test that is abnormal or we need to change your treatment, we will call you to review the results.   Testing/Procedures: Your physician has requested that you have an echocardiogram. Echocardiography is a painless test that uses sound waves to create images of your heart. It provides your doctor with information about the size and shape of your heart and how well your heart's chambers and valves are working. This procedure takes approximately one hour. There are no restrictions for this procedure.    Follow-Up: At [facility], you and your health needs are our priority.  As part of our continuing mission to provide you with exceptional heart care, we have created designated Provider Care Teams.  These Care Teams include your primary Cardiologist (physician) and Advanced Practice Providers (APPs -  Physician Assistants and Nurse Practitioners) who all work together to provide you with the care you need, when you need it.  We recommend signing up for the patient portal called "MyChart".  Sign up information is provided on this After Visit Summary.  MyChart is used to connect with patients for Virtual Visits (Telemedicine).  Patients are able to view lab/test results, encounter notes, upcoming appointments, etc.  Non-urgent messages can be sent to your provider as well.   To learn more about what you can do with MyChart, go to  [email or web address].    Your next appointment:   1 month(s)  The format for your next appointment:   In Person  Provider:   Shirlee More, MD   Other Instructions

## 2021-04-16 NOTE — Progress Notes (Signed)
Cardiology Office Note:    Date:  04/16/2021   ID:  Denise Mckenzie, DOB 1945-12-18, MRN [id number]  PCP:  Denise Dy, MD  Cardiologist:  Denise More, MD   Referring MD: Denise Dy, MD  ASSESSMENT:    1. 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 Coronary artery disease of native artery of native heart with stable angina pectoris ([place])   3. Primary hypertension   4. Mixed hyperlipidemia    PLAN:    In order of problems listed above:  She has developed heart failure decades remote from bypass surgery and is improved with diuretic therapy.  She will continue her current loop diuretic and has recommended follow-up patient is a heart failure is heart and SGLT2 inhibitor.  Echocardiogram ordered await results of EF is reduced will need optimization of therapy including SGLT2 inhibitor and MRA and continue her beta-blocker.  Presently she is not on an ARB.  I have asked her to weigh daily and record and bring with her next visit CAD reduce aspirin 81 mg daily continue beta-blocker statin at this time I would not advise an ischemia evaluation in the absence of angina. Await results of echocardiogram if EF is reduced initiate Entresto and MRA.  Next appointment 4 weeks   Medication Adjustments/Labs and Tests Ordered: Current medicines are reviewed at length with the patient today.  Concerns regarding medicines are outlined above.  No orders of the defined types were placed in this encounter.  No orders of the defined types were placed in this encounter.    Chief complaint: Shortness of breath and edema improved with loop diuretic and told that I  have congestive heart failure.  History of Present Illness:    Denise Mckenzie is a 75 y.o. female with a history of breast cancer and lung cancer who is being seen today for the evaluation of heart failure noted on recent CT of chest in July at the request of Denise Dy, MD.  She had a  myocardial perfusion study performed March 2015 showed normal exercise tolerance normal blood pressure response normal myocardial perfusion LV contractility and calculated ejection fraction of 79%  Previously she had been followed with Dr. Wyatt Mckenzie heart care with a history of CAD CABG in 1996 hypertension hyperlipidemia and diabetes mellitus.  She had a CT of the chest performed 02/19/2021 raising concerns for congestive heart failure a new finding compared to the CT scan in May she also has adrenal nodules/adenoma.  She was short of breath and edema was placed on a low-dose of furosemide did not improve was increased to 40 mg/day has lost 14 pounds and kept it off and feels remarkably improved with resolution of shortness of breath and edema.  She has no orthopnea PND has no angina palpitation or syncope.   Past Medical History:  Diagnosis Date   Arthritis    Breast cancer ([place]) dx'd 09/2015   COPD (chronic obstructive pulmonary disease) ([facility])    Coronary artery disease    Depression    Diabetes mellitus ([place]) 10/22/2015   Full dentures    full upper dentures, plate on bottom   History of bronchitis    History of pneumonia    Hyperlipidemia    Hypertension    Hypothyroidism 10/22/2015   lung ca dx;d 09/2015   lung cancer   Myocardial infarction [facility]) July 1995   Osteoporosis 10/22/2015   Personal history of radiation therapy    Stress incontinence  Uterine cancer ([place]) dx'd 1968    Past Surgical History:  Procedure Laterality Date   ABDOMINAL HYSTERECTOMY     1968   APPENDECTOMY     with hysterectomy   BLADDER SURGERY     1972   BREAST BIOPSY Left 11/04/2015   Malignant   BREAST BIOPSY Left 11/04/2015   Malignant   BREAST LUMPECTOMY Left 11/19/2015   CARDIAC CATHETERIZATION  1995, 1996   Dr. 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   LUNG BIOPSY N/A 11/13/2015   Procedure: RIGHT LUNG BIOPSY;  Surgeon: Denise Isaac, MD;  Location: [facility];  Service: Thoracic;  Laterality: N/A;   MULTIPLE TOOTH EXTRACTIONS     RADIOACTIVE SEED GUIDED PARTIAL MASTECTOMY WITH AXILLARY SENTINEL LYMPH NODE BIOPSY Left 11/19/2015   Procedure: RADIOACTIVE SEED GUIDED PARTIAL MASTECTOMY WITH AXILLARY SENTINEL LYMPH NODE BIOPSY;  Surgeon: Denise Mesa, MD;  Location: [facility];  Service: General;  Laterality: Left;   RE-EXCISION OF BREAST LUMPECTOMY Left 12/03/2015   Procedure: RE-EXCISION INFERIOR MARGIN OF LEFT BREAST LUMPECTOMY;  Surgeon: Denise Mesa, MD;  Location: [facility];  Service: General;  Laterality: Left;   TMJ [place] N/A 11/13/2015   Procedure: VIDEO BRONCHOSCOPY WITH ENDOBRONCHIAL NAVIGATION, with placement of fudicial markers;  Surgeon: Denise Isaac, MD;  [facility];  Service: Thoracic;  Laterality: N/A;    Current Medications: Current Meds  Medication Sig   acetaminophen (TYLENOL) 500 MG tablet Take 1,000 mg by mouth 2 (two) times daily as needed (pain). Reported on 01/20/2016   alendronate (FOSAMAX) 70 MG tablet TAKE 1 TABLET BY MOUTH ONCE A WEEK. TAKE WITH FULL GLASS OF WATER ON EMPTY STOMACH.   amitriptyline (ELAVIL) 50 MG tablet Take 50 mg by mouth at bedtime.     aspirin EC 325 MG tablet Take 325 mg by mouth daily.   atorvastatin (LIPITOR) 40 MG tablet Take 40 mg by mouth at bedtime.    Calcium Citrate-Vitamin D (CALCIUM + D PO) Take 2 tablets by mouth 3 (three) times daily. Each tablet Calcium 300 mg, Vitamin D3 250 I.U.   furosemide (LASIX) 40 MG tablet Take 40 mg by mouth daily.   ketoconazole (NIZORAL) 2 % cream Apply 1 application topically daily.   KLOR-CON M20 20 MEQ tablet Take 20 mEq by mouth  daily.   Krill Oil 1000 MG CAPS Take 1,000 mg by mouth daily.   levothyroxine (SYNTHROID, LEVOTHROID) 88 MCG tablet Take 88 mcg by mouth daily before breakfast.    metFORMIN (GLUCOPHAGE) 500 MG tablet Take 500 mg by mouth daily.   metoprolol (LOPRESSOR) 50 MG tablet Take 25 mg by mouth 2 (two) times daily. Reported on 01/28/2016   nitroGLYCERIN (NITROSTAT) 0.4 MG SL tablet Place 0.4 mg under the tongue every 5 (five) minutes as needed for chest pain. Reported on 01/28/2016     Allergies:   Patient has no known allergies.   Social History   Socioeconomic History   Marital status: Married    Spouse name: Not on file   Number of children: Not on  file   Years of education: Not on file   Highest education level: Not on file  Occupational History   Not on file  Tobacco Use   Smoking status: Former    Types: Cigarettes    Quit date: 08/17/1994    Years since quitting: 26.6   Smokeless tobacco: Never  Substance and Sexual Activity   Alcohol use: No   Drug use: No   Sexual activity: Not on file  Other Topics Concern   Not on file  Social History Narrative   Not on file   Social Determinants of Health   Financial Resource Strain: Not on file  Food Insecurity: Not on file  Transportation Needs: Not on file  Physical Activity: Not on file  Stress: Not on file  Social Connections: Not on file     Family History: The patient's family history includes Alzheimer's disease in her mother; Heart attack in her father. There is no history of Breast cancer.  ROS:   ROS Please see the history of present illness.     All other systems reviewed and are negative.  EKGs/Labs/Other Studies Reviewed:    The following studies were reviewed today:   EKG:  EKG is  ordered today.  The ekg ordered today is personally reviewed and demonstrates sinus rhythm old anteroseptal MI left atrial enlargement  Recent Labs: 01/06/2021: ALT 17; BUN 9; Creatinine 0.79; Hemoglobin 16.1; Platelet Count 161;  Potassium 4.8; Sodium 135  Hemoglobin 16.0 06/04/2020 potassium 4.9 creatinine 1.0 GFR 55 cc Recent Lipid Panel No results found for: CHOL, TRIG, HDL, CHOLHDL, VLDL, LDLCALC, LDLDIRECT  Physical Exam:    VS:  BP 130/74 (BP Location: Right Arm, Patient Position: Sitting, Cuff Size: Normal)   Pulse 80   Ht 5\' 3"  (1.6 m)   Wt 148 lb 1.9 oz (67.2 kg)   SpO2 96%   BMI 26.24 kg/m     Wt Readings from Last 3 Encounters:  04/16/21 148 lb 1.9 oz (67.2 kg)  01/06/21 159 lb 6.4 oz (72.3 kg)  06/04/20 166 lb (75.3 kg)     GEN: She looks her age well nourished, well developed in no acute distress HEENT: Normal NECK: No JVD; No carotid bruits LYMPHATICS: No lymphadenopathy CARDIAC: Distant heart sounds RRR, no murmurs, rubs, gallops RESPIRATORY:  Clear to auscultation without rales, wheezing or rhonchi  ABDOMEN: Soft, non-tender, non-distended MUSCULOSKELETAL:  No edema; No deformity  SKIN: Warm and dry NEUROLOGIC:  Alert and oriented x 3 PSYCHIATRIC:  Normal affect     Signed, Denise More, MD  04/16/2021 1:44 PM    [facility]

## 2021-04-16 NOTE — Addendum Note (Signed)
Addended by: Resa Miner I on: 04/16/2021 01:53 PM   Modules accepted: Orders

## 2021-04-17 ENCOUNTER — Telehealth: Payer: Self-pay

## 2021-04-17 LAB — PRO B NATRIURETIC PEPTIDE: NT-Pro BNP: 2990 pg/mL — ABNORMAL HIGH (ref 0–738)

## 2021-04-17 LAB — LIPID PANEL
Chol/HDL Ratio: 3.6 ratio (ref 0.0–4.4)
Cholesterol, Total: 121 mg/dL (ref 100–199)
HDL: 34 mg/dL — ABNORMAL LOW (ref >39)
LDL Chol Calc (NIH): 59 mg/dL (ref 0–99)
Triglycerides: 162 mg/dL — ABNORMAL HIGH (ref 0–149)
VLDL Cholesterol Cal: 28 mg/dL (ref 5–40)

## 2021-04-17 LAB — COMPREHENSIVE METABOLIC PANEL
ALT: 13 IU/L (ref 0–32)
AST: 16 IU/L (ref 0–40)
Albumin/Globulin Ratio: 1.5 (ref 1.2–2.2)
Albumin: 4.1 g/dL (ref 3.7–4.7)
Alkaline Phosphatase: 150 IU/L — ABNORMAL HIGH (ref 44–121)
BUN/Creatinine Ratio: 18 (ref 12–28)
BUN: 15 mg/dL (ref 8–27)
Bilirubin Total: 0.5 mg/dL (ref 0.0–1.2)
CO2: 30 mmol/L — ABNORMAL HIGH (ref 20–29)
Calcium: 9.4 mg/dL (ref 8.7–10.3)
Chloride: 94 mmol/L — ABNORMAL LOW (ref 96–106)
Creatinine, Ser: 0.82 mg/dL (ref 0.57–1.00)
Globulin, Total: 2.8 g/dL (ref 1.5–4.5)
Glucose: 92 mg/dL (ref 65–99)
Potassium: 4.3 mmol/L (ref 3.5–5.2)
Sodium: 141 mmol/L (ref 134–144)
Total Protein: 6.9 g/dL (ref 6.0–8.5)
eGFR: 74 mL/min/{1.73_m2} (ref >59)

## 2021-04-17 NOTE — Telephone Encounter (Signed)
-----   Message from Richardo Priest, MD sent at 04/17/2021  7:54 AM EDT ----- Stable continue current medications

## 2021-04-17 NOTE — Telephone Encounter (Signed)
Left message on patients voicemail to please return our call.   

## 2021-04-17 NOTE — Telephone Encounter (Signed)
Spoke with patient regarding results and recommendation.  Patient verbalizes understanding and is agreeable to plan of care. Advised patient to call back with any issues or concerns.  

## 2021-04-28 ENCOUNTER — Other Ambulatory Visit: Payer: Self-pay

## 2021-04-28 ENCOUNTER — Ambulatory Visit (HOSPITAL_BASED_OUTPATIENT_CLINIC_OR_DEPARTMENT_OTHER)
Admission: RE | Admit: 2021-04-28 | Discharge: 2021-04-28 | Disposition: A | Discharge status: Home / Self Care | Payer: Medicare Other | Source: Ambulatory Visit | Attending: Cardiology | Admitting: Cardiology

## 2021-04-28 DIAGNOSIS — I38 Endocarditis, valve unspecified: Secondary | ICD-10-CM | POA: Insufficient documentation

## 2021-04-28 DIAGNOSIS — I509 Heart failure, unspecified: Secondary | ICD-10-CM | POA: Insufficient documentation

## 2021-04-28 LAB — ECHOCARDIOGRAM COMPLETE
AR max vel: 2.33 cm2
AV Area VTI: 2.28 cm2
AV Area mean vel: 2.17 cm2
AV Mean grad: 2 mmHg
AV Peak grad: 3.7 mmHg
Ao pk vel: 0.97 m/s
Area-P 1/2: 3.34 cm2
Calc EF: 71.8 %
S' Lateral: 2.61 cm
Single Plane A2C EF: 69.4 %
Single Plane A4C EF: 71.6 %

## 2021-04-29 ENCOUNTER — Telehealth: Payer: Self-pay

## 2021-04-29 NOTE — Telephone Encounter (Signed)
Spoke with patient regarding results and recommendation.  Patient verbalizes understanding and is agreeable to plan of care. Advised patient to call back with any issues or concerns.  

## 2021-04-29 NOTE — Telephone Encounter (Signed)
-----   Message from Richardo Priest, MD sent at 04/29/2021  7:58 AM EDT ----- Normal or stable result  No changes

## 2021-05-08 ENCOUNTER — Ambulatory Visit: Payer: Medicare Other | Admitting: Internal Medicine

## 2021-05-09 ENCOUNTER — Ambulatory Visit: Payer: Medicare Other

## 2021-05-17 ENCOUNTER — Other Ambulatory Visit: Payer: Self-pay | Admitting: Oncology

## 2021-05-19 ENCOUNTER — Telehealth: Payer: Self-pay | Admitting: Oncology

## 2021-05-19 NOTE — Telephone Encounter (Signed)
Scheduled appointment per 10/01 sch msg. Patient is aware.

## 2021-05-29 ENCOUNTER — Other Ambulatory Visit: Payer: Self-pay

## 2021-05-29 ENCOUNTER — Ambulatory Visit: Payer: Medicare Other | Admitting: Cardiology

## 2021-05-29 ENCOUNTER — Encounter: Payer: Self-pay | Admitting: Cardiology

## 2021-05-29 VITALS — BP 132/80 | HR 42 | Ht 63.0 in | Wt 145.0 lb

## 2021-05-29 DIAGNOSIS — I5032 Chronic diastolic (congestive) heart failure: Secondary | ICD-10-CM | POA: Diagnosis not present

## 2021-05-29 DIAGNOSIS — E782 Mixed hyperlipidemia: Secondary | ICD-10-CM | POA: Diagnosis not present

## 2021-05-29 DIAGNOSIS — I25118 Atherosclerotic heart disease of native coronary artery with other forms of angina pectoris: Secondary | ICD-10-CM

## 2021-05-29 DIAGNOSIS — I110 Hypertensive heart disease with heart failure: Secondary | ICD-10-CM

## 2021-05-29 MED ORDER — FUROSEMIDE 40 MG PO TABS: 40.0000 mg | ORAL_TABLET | Freq: Every day | ORAL | 3 refills | Status: DC

## 2021-05-29 MED ORDER — NITROGLYCERIN 0.4 MG SL SUBL: 0.4000 mg | SUBLINGUAL_TABLET | SUBLINGUAL | 3 refills | Status: DC | PRN

## 2021-05-29 MED ORDER — KLOR-CON M20 20 MEQ PO TBCR: 20.0000 meq | EXTENDED_RELEASE_TABLET | Freq: Every day | ORAL | 3 refills | Status: DC

## 2021-05-29 NOTE — Patient Instructions (Signed)
Medication Instructions:  Your physician has recommended you make the following change in your medication:  If you wigh >146 lbs take an extra tablet of your furosemide in the evening.  *If you need a refill on your cardiac medications before your next appointment, please call your pharmacy*   Lab Work: Your physician recommends that you return for lab work in: [place] If you have labs (blood work) drawn today and your tests are completely normal, you will receive your results only by: [place] (if you have [place]) OR A paper copy in the mail If you have any lab test that is abnormal or we need to change your treatment, we will call you to review the results.   Testing/Procedures: None   Follow-Up: At [facility], you and your health needs are our priority.  As part of our continuing mission to provide you with exceptional heart care, we have created designated Provider Care Teams.  These Care Teams include your primary Cardiologist (physician) and Advanced Practice Providers (APPs -  Physician Assistants and Nurse Practitioners) who all work together to provide you with the care you need, when you need it.  We recommend signing up for the patient portal called "MyChart".  Sign up information is provided on this After Visit Summary.  MyChart is used to connect with patients for Virtual Visits (Telemedicine).  Patients are able to view lab/test results, encounter notes, upcoming appointments, etc.  Non-urgent messages can be sent to your provider as well.   To learn more about what you can do with MyChart, go to [email or web address].    Your next appointment:   6 month(s)  The format for your next appointment:   In Person  Provider:   Shirlee More, MD   Other Instructions

## 2021-05-29 NOTE — Progress Notes (Signed)
Cardiology Office Note:    Date:  05/29/2021   ID:  Denise Mckenzie, DOB 1945/12/25, MRN [id number]  PCP:  Lorene Dy, MD  Cardiologist:  Shirlee More, MD    Referring MD: Lorene Dy, MD    ASSESSMENT:    1. Hypertensive heart disease with heart failure ([place])   2. Chronic diastolic heart failure ([place])   3. Coronary artery disease of native artery of native heart with stable angina pectoris ([place])   4. Mixed hyperlipidemia    PLAN:    In order of problems listed above:  She has done very well with diuretic therapy her weight is down somewhere in the range of 16 to 18 pounds and is resolved signs and symptoms of heart failure and her bronchospasm is improved.  She will continue her current loop diuretic along with SGLT 2 inhibitor and for any weight gain of 4 pounds she will double the dose of her diuretic to prevent deterioration.  Her blood pressure today is at target she will continue her beta-blocker. Stable CAD continue medical therapy aspirin statin beta-blocker she is having no angina on current medical therapy and after remote CABG Lipids at target continue her high intensity statin   Next appointment:6 months   Medication Adjustments/Labs and Tests Ordered: Current medicines are reviewed at length with the patient today.  Concerns regarding medicines are outlined above.  No orders of the defined types were placed in this encounter.  No orders of the defined types were placed in this encounter.   Chief Complaint  Patient presents with   Follow-up  For heart failure  History of Present Illness:    Denise Mckenzie is a 75 y.o. female with a hx of CAD with CABG in 1996 hypertension hyperlipidemia diabetes mellitus breast cancer and lung cancer last seen 04/16/2021 for heart failure noted on chest CT scan.  She was continued on a loop diuretic with clinical improvement and SGLT2 inhibitor was initiated.  Compliance with diet, lifestyle and medications: Yes  I  reviewed the echo finding of diastolic heart failure with her. She is meticulous weighing daily her weights have plateaued in the range of 142 pounds she has no edema breathing is markedly improved and her previous bronchospasm has resolved she is very pleased with the quality of her life.  She has upcoming follow-up CT scan with oncology.  She is not having edema orthopnea wheezing chest pain palpitation or syncope.  She had a myocardial perfusion study performed March 2015 showed normal exercise tolerance normal blood pressure response normal myocardial perfusion LV contractility and calculated ejection fraction of 79%   Previously she had been followed with Dr. Wyatt Haste heart care with a history of CAD CABG in 1996 hypertension hyperlipidemia and diabetes mellitus.   She had a CT of the chest performed 02/19/2021 raising concerns for congestive heart failure a new finding compared to the CT scan in May she also has adrenal nodules/adenoma.  Following the visit she had an echocardiogram performed 04/28/2021 left ventricle was normal in size wall thickness EF 55 to 60% with grade 1 diastolic dysfunction and normal diastolic filling pressure right ventricle is mildly enlarged with normal function and there is no significant valvular abnormality  Past Medical History:  Diagnosis Date   Arthritis    Breast cancer ([place]) dx'd 09/2015   COPD (chronic obstructive pulmonary disease) ([place])    Coronary artery disease    Depression    Diabetes mellitus ([place]) 10/22/2015   Full dentures  full upper dentures, plate on bottom   History of bronchitis    History of pneumonia    Hyperlipidemia    Hypertension    Hypothyroidism 10/22/2015   lung ca dx;d 09/2015   lung cancer   Myocardial infarction [facility]) July 1995   Osteoporosis 10/22/2015   Personal history of radiation therapy    Stress incontinence    Uterine cancer ([place]) dx'd 1968    Past Surgical History:  Procedure Laterality Date   ABDOMINAL  HYSTERECTOMY     1968   APPENDECTOMY     with hysterectomy   BLADDER SURGERY     1972   BREAST BIOPSY Left 11/04/2015   Malignant   BREAST BIOPSY Left 11/04/2015   Malignant   BREAST LUMPECTOMY Left 11/19/2015   CARDIAC CATHETERIZATION  1995, 1996   Dr. 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   LUNG BIOPSY N/A 11/13/2015   Procedure: RIGHT LUNG BIOPSY;  Surgeon: Grace Isaac, MD;  Location: [facility];  Service: Thoracic;  Laterality: N/A;   MULTIPLE TOOTH EXTRACTIONS     RADIOACTIVE SEED GUIDED PARTIAL MASTECTOMY WITH AXILLARY SENTINEL LYMPH NODE BIOPSY Left 11/19/2015   Procedure: RADIOACTIVE SEED GUIDED PARTIAL MASTECTOMY WITH AXILLARY SENTINEL LYMPH NODE BIOPSY;  Surgeon: Donnie Mesa, MD;  Location: [facility];  Service: General;  Laterality: Left;   RE-EXCISION OF BREAST LUMPECTOMY Left 12/03/2015   Procedure: RE-EXCISION INFERIOR MARGIN OF LEFT BREAST LUMPECTOMY;  Surgeon: Donnie Mesa, MD;  Location: [facility];  Service: General;  Laterality: Left;   TMJ [place] N/A 11/13/2015   Procedure: VIDEO BRONCHOSCOPY WITH ENDOBRONCHIAL NAVIGATION, with placement of fudicial markers;  Surgeon: Grace Isaac, MD;  [facility];  Service: Thoracic;  Laterality: N/A;    Current Medications: Current Meds  Medication Sig   acetaminophen (TYLENOL) 500 MG tablet Take 1,000 mg by mouth 2 (two) times daily as needed (pain). Reported on 01/20/2016   albuterol (VENTOLIN HFA) 108 (90 Base) MCG/ACT inhaler Inhale 2 puffs into the lungs 4 (four) times daily as needed for shortness of breath.   alendronate (FOSAMAX) 70 MG tablet TAKE 1 TABLET BY MOUTH ONCE A  WEEK. TAKE WITH FULL GLASS OF WATER ON EMPTY STOMACH.   amitriptyline (ELAVIL) 50 MG tablet Take 50 mg by mouth at bedtime.     aspirin EC 81 MG tablet Take 1 tablet (81 mg total) by mouth daily. Swallow whole.   atorvastatin (LIPITOR) 40 MG tablet Take 40 mg by mouth at bedtime.    Calcium Citrate-Vitamin D (CALCIUM + D PO) Take 2 tablets by mouth 3 (three) times daily. Each tablet Calcium 300 mg, Vitamin D3 250 I.U.   dapagliflozin propanediol (FARXIGA) 10 MG TABS tablet Take 1 tablet (10 mg total) by mouth daily before breakfast.   furosemide (LASIX) 40 MG tablet Take 40 mg by mouth daily.   ketoconazole (NIZORAL) 2 % cream Apply 1 application topically daily.   KLOR-CON M20 20 MEQ tablet Take 20 mEq by mouth daily.   Krill Oil 1000 MG CAPS Take 1,000 mg by  mouth daily.   levothyroxine (SYNTHROID, LEVOTHROID) 88 MCG tablet Take 88 mcg by mouth daily before breakfast.    metFORMIN (GLUCOPHAGE) 500 MG tablet Take 500 mg by mouth daily.   metoprolol succinate (TOPROL-XL) 50 MG 24 hr tablet Take 50 mg by mouth daily.   nitroGLYCERIN (NITROSTAT) 0.4 MG SL tablet Place 0.4 mg under the tongue every 5 (five) minutes as needed for chest pain. Reported on 01/28/2016     Allergies:   Patient has no known allergies.   Social History   Socioeconomic History   Marital status: Married    Spouse name: Not on file   Number of children: Not on file   Years of education: Not on file   Highest education level: Not on file  Occupational History   Not on file  Tobacco Use   Smoking status: Former    Types: Cigarettes    Quit date: 08/17/1994    Years since quitting: 26.8   Smokeless tobacco: Never  Substance and Sexual Activity   Alcohol use: No   Drug use: No   Sexual activity: Not on file  Other Topics Concern   Not on file  Social History Narrative   Not on file   Social Determinants of Health   Financial Resource Strain: Not on file  Food Insecurity: Not on file  Transportation Needs:  Not on file  Physical Activity: Not on file  Stress: Not on file  Social Connections: Not on file     Family History: The patient's family history includes Alzheimer's disease in her mother; Heart attack in her father. There is no history of Breast cancer. ROS:   Please see the history of present illness.    All other systems reviewed and are negative.  EKGs/Labs/Other Studies Reviewed:    The following studies were reviewed today:   Recent Labs: 01/06/2021: Hemoglobin 16.1; Platelet Count 161 04/16/2021: ALT 13; BUN 15; Creatinine, Ser 0.82; NT-Pro BNP 2,990; Potassium 4.3; Sodium 141  Recent Lipid Panel    Component Value Date/Time   CHOL 121 04/16/2021 1351   TRIG 162 (H) 04/16/2021 1351   HDL 34 (L) 04/16/2021 1351   CHOLHDL 3.6 04/16/2021 1351   LDLCALC 59 04/16/2021 1351    Physical Exam:    VS:  BP 132/80 (BP Location: Right Arm, Patient Position: Sitting, Cuff Size: Normal)   Pulse (!) 42   Ht 5\' 3"  (1.6 m)   Wt 145 lb (65.8 kg)   SpO2 92%   BMI 25.69 kg/m     Wt Readings from Last 3 Encounters:  05/29/21 145 lb (65.8 kg)  04/16/21 148 lb 1.9 oz (67.2 kg)  01/06/21 159 lb 6.4 oz (72.3 kg)     GEN: Marked improvement in appearance well nourished, well developed in no acute distress HEENT: Normal NECK: No JVD; No carotid bruits LYMPHATICS: No lymphadenopathy CARDIAC: RRR, no murmurs, rubs, gallops RESPIRATORY: Hyperinflated diminished breath sounds without rales, wheezing or rhonchi  ABDOMEN: Soft, non-tender, non-distended MUSCULOSKELETAL:  No edema; No deformity  SKIN: Warm and dry NEUROLOGIC:  Alert and oriented x 3 PSYCHIATRIC:  Normal affect    Signed, Shirlee More, MD  05/29/2021 1:53 PM    [facility]

## 2021-05-30 LAB — BASIC METABOLIC PANEL
BUN/Creatinine Ratio: 16 (ref 12–28)
BUN: 15 mg/dL (ref 8–27)
CO2: 28 mmol/L (ref 20–29)
Calcium: 9.5 mg/dL (ref 8.7–10.3)
Chloride: 93 mmol/L — ABNORMAL LOW (ref 96–106)
Creatinine, Ser: 0.91 mg/dL (ref 0.57–1.00)
Glucose: 82 mg/dL (ref 70–99)
Potassium: 4.7 mmol/L (ref 3.5–5.2)
Sodium: 136 mmol/L (ref 134–144)
eGFR: 65 mL/min/{1.73_m2} (ref >59)

## 2021-05-30 LAB — PRO B NATRIURETIC PEPTIDE: NT-Pro BNP: 1091 pg/mL — ABNORMAL HIGH (ref 0–738)

## 2021-06-04 ENCOUNTER — Telehealth: Payer: Self-pay | Admitting: Cardiology

## 2021-06-04 ENCOUNTER — Other Ambulatory Visit: Payer: Self-pay | Admitting: Cardiology

## 2021-06-04 MED ORDER — FUROSEMIDE 40 MG PO TABS: ORAL_TABLET | ORAL | 3 refills | Status: DC

## 2021-06-04 NOTE — Telephone Encounter (Signed)
Refill sent in per request.  

## 2021-06-04 NOTE — Telephone Encounter (Signed)
Patient states during her visit on 10/13 with Dr. Bettina Gavia they discussed adjusting her Furosemide, but it was sent to the pharmacy with the original dosage instructions. She states it was changed from 1 tablet daily to 1 tablet daily + an additional tablet if her weight gets above 146 lbs. She is hoping to have the Rx modified with clear instructions and resubmitted to the pharmacy.   *STAT* If patient is at the pharmacy, call can be transferred to refill team.   1. Which medications need to be refilled? (please list name of each medication and dose if known)  furosemide (LASIX) 40 MG tablet  2. Which pharmacy/location (including street and city if local pharmacy) is medication to be sent to? [id number] - [place], [place] - [address]  3. Do they need a 30 day or 90 day supply?  90 day supply

## 2021-06-05 ENCOUNTER — Other Ambulatory Visit: Payer: Medicare Other

## 2021-06-05 ENCOUNTER — Ambulatory Visit: Payer: Medicare Other | Admitting: Oncology

## 2021-06-10 ENCOUNTER — Other Ambulatory Visit: Payer: Self-pay

## 2021-06-10 ENCOUNTER — Ambulatory Visit (HOSPITAL_COMMUNITY)
Admission: RE | Admit: 2021-06-10 | Discharge: 2021-06-10 | Disposition: A | Discharge status: Home / Self Care | Payer: Medicare Other | Source: Ambulatory Visit | Attending: Oncology | Admitting: Oncology

## 2021-06-10 DIAGNOSIS — Z170 Estrogen receptor positive status [ER+]: Secondary | ICD-10-CM | POA: Diagnosis present

## 2021-06-10 DIAGNOSIS — C50412 Malignant neoplasm of upper-outer quadrant of left female breast: Secondary | ICD-10-CM | POA: Insufficient documentation

## 2021-06-10 DIAGNOSIS — C3411 Malignant neoplasm of upper lobe, right bronchus or lung: Secondary | ICD-10-CM | POA: Diagnosis not present

## 2021-06-10 DIAGNOSIS — Z131 Encounter for screening for diabetes mellitus: Secondary | ICD-10-CM | POA: Insufficient documentation

## 2021-06-10 DIAGNOSIS — M818 Other osteoporosis without current pathological fracture: Secondary | ICD-10-CM | POA: Insufficient documentation

## 2021-06-10 LAB — GLUCOSE, CAPILLARY: Glucose-Capillary: 129 mg/dL — ABNORMAL HIGH (ref 70–99)

## 2021-06-10 MED ORDER — FLUDEOXYGLUCOSE F - 18 (FDG) INJECTION
6.5000 | Freq: Once | INTRAVENOUS | Status: AC | PRN
  Administered 2021-06-10: 7.26 via INTRAVENOUS

## 2021-07-07 NOTE — Progress Notes (Signed)
[place]  Telephone:([phone] Fax:([phone]) [phone]     ID: Denise Mckenzie DOB: 04-Oct-1945  MR#: [id number]  KPT#:[id number]  Patient Care Team: Lorene Dy, MD as PCP - General (Internal Medicine) Grace Isaac, MD (Inactive) as Consulting Physician (Cardiothoracic Surgery) Donnie Mesa, MD as Consulting Physician (General Surgery) Thea Silversmith, MD as Consulting Physician (Radiation Oncology) Lynne Takemoto, Virgie Dad, MD as Consulting Physician (Oncology) Curt Bears, MD as Consulting Physician (Oncology) Richardo Priest, MD as Consulting Physician (Cardiology) OTHER MD:  CHIEF COMPLAINT: Lung cancer, breast cancer  CURRENT TREATMENT: Anastrozole, alendronate (Fosamax)   INTERVAL HISTORY: Denise Mckenzie returns today for a follow-up of her estrogen receptor positive breast cancer as well as follow-up of her lung cancer.   She discontinued anastrozole 2 months ago.  Hot flashes have improved.  Of course she has had other issues which have complicated her assessment of how she feels coming off the medication  Her most recent bone density screening from 02/27/2019 showed a T-score of -2.7, which is considered osteoporotic.  She tolerates alendronate without any side effects that she is aware of and takes it appropriately. She is scheduled for repeat screening on 07/14/2021.  Since her last visit, she underwent bilateral diagnostic mammography with tomography at [facility] on 04/12/2021 showing: breast density category B; no evidence of malignancy in either breast.  She also underwent restaging PET scan on 06/10/2021 showing: band of pulmonary scarring in right upper lobe, favor benign post-treatment inflammation; mild activity in right hilum, favored reactive lymph node.  This is very favorable   REVIEW OF SYSTEMS: Denise Mckenzie tells me she has been diagnosed with congestive heart failure.  She is now seeing Dr. Bettina Gavia for cardiology.   She is limited in what  activities she can do.  At this point she is not on home oxygen.  After diuresis though she says her breathing has improved.  A detailed review of systems was otherwise stable.   COVID 19 VACCINATION STATUS: Pfizer x4 as of November 2022   BREAST AND LUNG CANCER PRESENTING HISTORY:  LUNG CANCER From the earlier summary report:  Denise Mckenzie developed some cough early January 2017 and brought it to her primary care physician's attention. A chest x-ray was obtained 09/01/2014. This showed a vague lingular density measuring 1.4 cm. Further evaluation with a CT scan of the chest obtained 09/11/2015 found a 1.1 cm right upper lobe nodule. The area in the lingular density was felt to be scarring. There were other scattered nodules too small to characterize. There was no adenopathy and no bony lesions.  Accordingly on 10/11/2015 the patient underwent PET scan. The right upper lobe lung lesion was hypermetabolic, with an SUV max of 24.8. It measured 1.4 cm on the PET scan. At least for small pulmonary nodules were also again noted, the largest measuring 4 mm, all below PET resolution. There were felt to be likely benign. Incidentally a hypermetabolic 1 cm soft tissue nodule in the left breast was noted, with an SUV max of 4.1. A right adrenal mass measuring 2.5 cm was not hypermetabolic, consistent with a benign adenoma. CT scan of the head with and without contrast 10/22/2015 was benign  The patient's case was presented at the lung cancer conference. Dr. Servando Snare comments that the patient's diffusion capacity was 28% and the FEV 1 was 47% of predicted, limiting surgical choices. Accordingly on 11/13/2015 the patient underwent video bronchoscopy with endobronchial navigation and right lung biopsy with marker placement The pathology from this  procedure ([id number]) showed only blood clot and scant mucosal tissue. It was not diagnostic that it appears to be clearly discordant given the earlier PET results. The patient  completed definitive surgery for the lung lesion on 01/21/2016  BREAST CANCER From the initial intake note:  The incidentally noted left breast mass was further evaluated with bilateral diagnostic mammography with tomography and [facility]28[facility]. This found a breast density to be category be. In the upper-outer quadrant there was a 1.0 cm spiculated mass which was not palpable. Ultrasonography confirmed a spiculated hypoechoic mass at the 2:00 position of the left breast, 3 cm from the nipple, measuring 1.0 cm. In the left axilla there was a 1.2 cm lymph node with a prominent cortex.  Biopsy of the left breast mass in question 11/04/2015 showed ([id number]) invasive ductal carcinoma, grade 2, estrogen receptor 100% positive, progesterone receptor 100% positive, both with strong staining intensity, with an MIB-1 of 6%, and no HER-2 amplification, the signals ratio being 1.32 and the number per cell 3.10. The suspicious left axillary lymph node was biopsied at the same time and it showed no evidence of carcinoma. This was felt to be concordant.  On 11/19/2015 Denise Mckenzie underwent left lumpectomy with sentinel lymph node sampling. The final pathology ([id number]) confirmed an invasive ductal carcinoma, grade 2, measuring 1.2 cm. Invasive disease was focally present at the inferior margin. In situ disease was less than 0.1 cm to the inferior margin as well. All 4 sentinel lymph nodes were clear. Repeat HER-2 was again negative with a signals ratio of 1.43, the number per cell being 3.50.  Her subsequent history is as detailed below.   PAST MEDICAL HISTORY:A Past Medical History:  Diagnosis Date   Arthritis    Breast cancer ([place]) dx'd 09/2015   COPD (chronic obstructive pulmonary disease) (HCC)    Coronary artery disease    Depression    Diabetes mellitus ([place]) 10/22/2015   Full dentures    full upper dentures, plate on bottom   History of bronchitis     History of pneumonia    Hyperlipidemia    Hypertension    Hypothyroidism 10/22/2015   lung ca dx;d 09/2015   lung cancer   Myocardial infarction [facility]) July 1995   Osteoporosis 10/22/2015   Personal history of radiation therapy    Stress incontinence    Uterine cancer ([place]) dx'd 1968    PAST SURGICAL HISTORY: Past Surgical History:  Procedure Laterality Date   ABDOMINAL HYSTERECTOMY     1968   APPENDECTOMY     with hysterectomy   BLADDER SURGERY     1972   BREAST BIOPSY Left 11/04/2015   Malignant   BREAST BIOPSY Left 11/04/2015   Malignant   BREAST LUMPECTOMY Left 11/19/2015   CARDIAC CATHETERIZATION  1995, 1996   Dr. 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   LUNG BIOPSY N/A 11/13/2015   Procedure: RIGHT LUNG BIOPSY;  Surgeon: Grace Isaac, MD;  Location: [facility] OR;  Service: Thoracic;  Laterality: N/A;   [place]  NODE BIOPSY Left 11/19/2015   Procedure: RADIOACTIVE SEED GUIDED PARTIAL MASTECTOMY WITH AXILLARY SENTINEL LYMPH NODE BIOPSY;  Surgeon: Donnie Mesa, MD;  Location: [place];  Service: General;  Laterality: Left;   RE-EXCISION OF BREAST LUMPECTOMY Left 12/03/2015   Procedure: RE-EXCISION INFERIOR MARGIN OF LEFT BREAST LUMPECTOMY;  Surgeon: Donnie Mesa, MD;  Location: [place];  Service: General;  Laterality: Left;   TMJ ARTHROPLASTY     1979   TONSILLECTOMY     VIDEO BRONCHOSCOPY WITH ENDOBRONCHIAL NAVIGATION N/A 11/13/2015   Procedure: VIDEO BRONCHOSCOPY WITH ENDOBRONCHIAL NAVIGATION, with placement of fudicial markers;  Surgeon: Grace Isaac, MD;  Location: [facility] OR;  Service: Thoracic;  Laterality: N/A;     FAMILY HISTORY Family History  Problem Relation Age of Onset   Alzheimer's disease Mother    Heart attack Father    Breast cancer Neg Hx   The patient's father died from a heart attack at age 46. The patient's mother died with Alzheimer's disease at age 95. Denise Mckenzie has one brother and one sister. The only cancer in the family was breast cancer in a maternal aunt diagnosed in her early 32s.   GYNECOLOGIC HISTORY:  No LMP recorded. Patient has had a hysterectomy. Menarche age 88, first live birth age 23. The patient is GX P2. She underwent total abdominal hysterectomy with bilateral salpingo-oophorectomy 1968. She did not take hormone replacement.   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 Denise Mckenzie lives in [place]. The patient's daughter Denise Mckenzie lives in [place] and works in Programmer, applications. The patient has 3 grandchildren and 2 great-grandchildren. Her youngest grandchild graduated from high school June 2017.  Denise Mckenzie attends a [employer] in [place]: In place   HEALTH MAINTENANCE: Social History   Tobacco Use   Smoking status: Former    Types: Cigarettes    Quit date: 08/17/1994    Years since quitting: 26.9   Smokeless tobacco: Never  Substance Use Topics   Alcohol use: No   Drug use: No     Colonoscopy:2010/[facility]  PAP: 2015  Bone density: 2016/[facility]  Lipid panel:  No Known Allergies  Current Outpatient Medications  Medication Sig Dispense Refill   acetaminophen (TYLENOL) 500 MG tablet Take 1,000 mg by mouth 2 (two) times daily as needed (pain). Reported on 01/20/2016     albuterol (VENTOLIN HFA) 108 (90 Base) MCG/ACT inhaler Inhale 2 puffs into the lungs 4 (four) times daily as needed for shortness of breath.     alendronate (FOSAMAX) 70 MG tablet TAKE 1 TABLET BY MOUTH ONCE A WEEK. TAKE WITH  FULL GLASS OF WATER ON EMPTY STOMACH. 12 tablet 3   amitriptyline (ELAVIL) 50 MG tablet Take 50 mg by mouth at bedtime.       aspirin EC 81 MG tablet Take 1 tablet (81 mg total) by mouth daily. Swallow whole. 90 tablet 3   atorvastatin (LIPITOR) 40 MG tablet Take 40 mg by mouth at bedtime.      Calcium Citrate-Vitamin D (CALCIUM + D PO) Take 2 tablets by mouth 3 (three) times daily. Each tablet Calcium 300 mg, Vitamin D3 250 I.U.     dapagliflozin propanediol (FARXIGA) 10 MG TABS tablet Take 1 tablet (10 mg total) by mouth daily before breakfast. 90 tablet 3   furosemide (LASIX) 40  MG tablet Take one tablet by mouth daily. Take an extra tablet daily in the evening if your weight is above 146 lbs. 120 tablet 3   ketoconazole (NIZORAL) 2 % cream Apply 1 application topically daily. 15 g 6   KLOR-CON M20 20 MEQ tablet Take 1 tablet (20 mEq total) by mouth daily. 90 tablet 3   Krill Oil 1000 MG CAPS Take 1,000 mg by mouth daily.     levothyroxine (SYNTHROID, LEVOTHROID) 88 MCG tablet Take 88 mcg by mouth daily before breakfast.      metFORMIN (GLUCOPHAGE) 500 MG tablet Take 500 mg by mouth daily.  4   metoprolol succinate (TOPROL-XL) 50 MG 24 hr tablet Take 50 mg by mouth daily.     nitroGLYCERIN (NITROSTAT) 0.4 MG SL tablet Place 1 tablet (0.4 mg total) under the tongue every 5 (five) minutes as needed for chest pain. Reported on 01/28/2016 30 tablet 3   No current facility-administered medications for this visit.    OBJECTIVE: White woman who appears stated age  57:   07/08/21 0946  BP: 124/64  Pulse: 80  Resp: 18  Temp: 97.7 F (36.5 C)  SpO2: 93%      Body mass index is 25.53 kg/m.    ECOG FS:1 - Symptomatic but completely ambulatory   Sclerae unicteric, EOMs intact Wearing a mask No cervical or supraclavicular adenopathy Lungs no rales or rhonchi Heart regular rate and rhythm Abd soft, obese, nontender, positive bowel sounds MSK no focal spinal tenderness, no upper extremity  lymphedema Neuro: nonfocal, well oriented, appropriate affect Breasts: The right breast is benign.  The left breast has undergone lumpectomy and radiation.  There is no evidence of local recurrence.  Both axillae are benign.   LAB RESULTS:  CMP     Component Value Date/Time   NA 136 05/29/2021 1408   NA 136 08/19/2017 1411   K 4.7 05/29/2021 1408   K 4.5 08/19/2017 1411   CL 93 (L) 05/29/2021 1408   CO2 28 05/29/2021 1408   CO2 30 (H) 08/19/2017 1411   GLUCOSE 82 05/29/2021 1408   GLUCOSE 91 01/06/2021 1046   GLUCOSE 91 08/19/2017 1411   BUN 15 05/29/2021 1408   BUN 14.7 08/19/2017 1411   CREATININE 0.91 05/29/2021 1408   CREATININE 0.79 01/06/2021 1046   CREATININE 1.0 08/19/2017 1411   CALCIUM 9.5 05/29/2021 1408   CALCIUM 9.2 08/19/2017 1411   PROT 6.9 04/16/2021 1351   PROT 6.8 08/19/2017 1411   ALBUMIN 4.1 04/16/2021 1351   ALBUMIN 3.4 (L) 08/19/2017 1411   AST 16 04/16/2021 1351   AST 15 01/06/2021 1046   AST 18 08/19/2017 1411   ALT 13 04/16/2021 1351   ALT 17 01/06/2021 1046   ALT 14 08/19/2017 1411   ALKPHOS 150 (H) 04/16/2021 1351   ALKPHOS 112 08/19/2017 1411   BILITOT 0.5 04/16/2021 1351   BILITOT 0.7 01/06/2021 1046   BILITOT 0.41 08/19/2017 1411   GFRNONAA >60 01/06/2021 1046   GFRAA >60 07/06/2019 0925    INo results found for: SPEP, UPEP  Lab Results  Component Value Date   WBC 9.7 07/08/2021   NEUTROABS 7.1 07/08/2021   HGB 17.4 (H) 07/08/2021   HCT 51.7 (H) 07/08/2021   MCV 90.7 07/08/2021   PLT 158 07/08/2021      Chemistry      Component Value Date/Time   NA 136 05/29/2021 1408   NA 136 08/19/2017 1411   K 4.7 05/29/2021 1408  K 4.5 08/19/2017 1411   CL 93 (L) 05/29/2021 1408   CO2 28 05/29/2021 1408   CO2 30 (H) 08/19/2017 1411   BUN 15 05/29/2021 1408   BUN 14.7 08/19/2017 1411   CREATININE 0.91 05/29/2021 1408   CREATININE 0.79 01/06/2021 1046   CREATININE 1.0 08/19/2017 1411      Component Value Date/Time   CALCIUM  9.5 05/29/2021 1408   CALCIUM 9.2 08/19/2017 1411   ALKPHOS 150 (H) 04/16/2021 1351   ALKPHOS 112 08/19/2017 1411   AST 16 04/16/2021 1351   AST 15 01/06/2021 1046   AST 18 08/19/2017 1411   ALT 13 04/16/2021 1351   ALT 17 01/06/2021 1046   ALT 14 08/19/2017 1411   BILITOT 0.5 04/16/2021 1351   BILITOT 0.7 01/06/2021 1046   BILITOT 0.41 08/19/2017 1411       No results found for: LABCA2  No components found for: LABCA125  No results for input(s): INR in the last 168 hours.  Urinalysis No results found for: COLORURINE, APPEARANCEUR, LABSPEC, PHURINE, GLUCOSEU, HGBUR, BILIRUBINUR, KETONESUR, PROTEINUR, UROBILINOGEN, NITRITE, LEUKOCYTESUR   ELIGIBLE FOR AVAILABLE RESEARCH PROTOCOL: No  STUDIES: NM PET Image Restag (PS) Skull Base To Thigh  Result Date: 06/12/2021 CLINICAL DATA:  I subsequent nitial treatment strategy for breast cancer. EXAM: NUCLEAR MEDICINE PET SKULL BASE TO THIGH TECHNIQUE: 7.3 mCi F-18 FDG was injected intravenously. Full-ring PET imaging was performed from the skull base to thigh after the radiotracer. CT data was obtained and used for attenuation correction and anatomic localization. Fasting blood glucose: 129 mg/dl COMPARISON:  None. FINDINGS: Mediastinal blood pool activity: SUV max 2.29 Liver activity: SUV max 3.41 NECK: No hypermetabolic lymph nodes in the neck. Incidental CT findings: none CHEST: Linear nodular scarring in the RIGHT upper lobe adjacent to fiducial markers measures 5.6 x 1.6 cm compared to 5.3 by 1.6 cm on CT 12/24/2020. Lesion is increased in density compared to CT 06/17/2020 but similar in size. This elongated lesion has mild metabolic activity SUV max equal 3.0. Focus of hypermetabolic activity at the RIGHT hilum is with SUV max equal 2.9. Potential small node at this level on image 59. Incidental CT findings: Post CABG ABDOMEN/PELVIS: No abnormal hypermetabolic activity within the liver, pancreas, adrenal glands, or spleen. No  hypermetabolic lymph nodes in the abdomen or pelvis. Incidental CT findings: Probable gallstones. No evidence of acute inflammation. Atherosclerotic calcification of the aorta. SKELETON: No focal hypermetabolic activity to suggest skeletal metastasis. Incidental CT findings: none IMPRESSION: 1. Band of pulmonary scarring in the RIGHT upper lobe at fiducial markers is similar to recent CT 12/24/2020 and increased in density from CT 06/17/2020. Lesion has mild to moderate metabolic activity. Favor benign post treatment inflammation. Recommend continued surveillance. 2. Mild activity in the RIGHT hilum favored reactive lymph node. Electronically Signed   By: Suzy Bouchard M.D.   On: 06/12/2021 14:05      ASSESSMENT: 75 y.o. [place] woman  LUNG CANCER: (1) hypermetabolic right upper lobe lung mass noted on chest CT scan 09/11/2015, clinically T1a N0  (a) biopsy by video bronchoscopy 11/13/2015 nondiagnostic  (b) definitive stereotactic radiosurgery Completed 01/21/2016  (c) follow up: most recent chest CT scan 02/01/2018 shows no evidence of progressive or recurrent disease  (d) chest CT scan 04/04/2019 stable  (e) chest CT 06/17/2020 shows a scar associated 9 mm nodular area in the right upper lobe  (f) chest CT 12/24/2020 shows nodular area noted above is stable  (g) PET scan 06/12/2021 shows posttreatment  changes, no evidence of disease recurrence or progression  BREAST CANCER: (2) biopsy of a left upper outer quadrant breast mass 10/28/2015 showed a clinical T1c N0, stage IA invasive ductal carcinoma, grade 2, estrogen and progesterone strongly positive, HER-2 negative, with an MIB-1 of 6%.  (3) status post left lumpectomy and sentinel lymph node sampling 11/19/2015 for apT1c pN0, stage IA invasive ductal carcinoma, grade 2, repeat HER-2 again negative, with a focally positive inferior margin  (a) additional surgery for margin clearance  12/03/2015   (4) Adjuvant radiation:  01/08/16 -  01/30/16: Left Breast, 42.720 Gy in 16 fractions  01/14/16, 01/17/16, 01/21/16: Right Upper Lung, 54 Gy in 3 fractions   (5) started anastrozole 02/18/2016, completing 5 years June 2022  (a) bone density 10/28/2016 shows a T score of -2.9.   (b) on ibandronate as of July 2017, switch to alendronate April 2018 because of cost issues  (c) repeat bone density 02/27/2019 shows a T score of -2.7 (stable).    PLAN: Denise Mckenzie is now more than 5 years out from initial diagnosis of her breast and lung cancers.  There is no evidence of disease activity.  This is very favorable.  She completed 5 years of anastrozole.  She discontinued that medication 2 months ago.  She continues on Fosamax for bone density issues and she has a repeat bone density later this month.  Unfortunately she has developed congestive heart failure.  Her hemoglobin has risen to above 17 as a result of hypoxia.  As the congestive heart failure is treated that number likely will come down to the 15-16% range.  This does not require evaluation for polycythemia vera.  It is a normal response of the body to inadequate oxygenation  At this point I feel comfortable releasing her to her primary care physicians.  All she will need in terms of breast cancer follow-up is her yearly mammography and a yearly physician breast exam  Total encounter time 25 minutes.Denise Jews C. Lemoine Goyne, MD  07/08/21 10:12 AM Medical Oncology and Hematology [facility] [place], [place] [address] Tel. [phone]    Fax. [phone]   I, Wilburn Mylar, am acting as scribe for Dr. Virgie Dad. Denise Mckenzie.  I, Lurline Del MD, have reviewed the above documentation for accuracy and completeness, and I agree with the above.   *Total Encounter Time as defined by the Centers for Medicare and Medicaid Services includes, in addition to the face-to-face time of a patient visit (documented in the note above) non-face-to-face time: obtaining  and reviewing outside history, ordering and reviewing medications, tests or procedures, care coordination (communications with other health care professionals or caregivers) and documentation in the medical record.

## 2021-07-08 ENCOUNTER — Inpatient Hospital Stay: Payer: Medicare Other | Attending: Oncology

## 2021-07-08 ENCOUNTER — Other Ambulatory Visit: Payer: Self-pay

## 2021-07-08 ENCOUNTER — Inpatient Hospital Stay: Payer: Medicare Other | Admitting: Oncology

## 2021-07-08 VITALS — BP 124/64 | HR 80 | Temp 97.7°F | Resp 18 | Ht 63.0 in | Wt 144.1 lb

## 2021-07-08 DIAGNOSIS — I509 Heart failure, unspecified: Secondary | ICD-10-CM | POA: Diagnosis not present

## 2021-07-08 DIAGNOSIS — C50412 Malignant neoplasm of upper-outer quadrant of left female breast: Secondary | ICD-10-CM

## 2021-07-08 DIAGNOSIS — C549 Malignant neoplasm of corpus uteri, unspecified: Secondary | ICD-10-CM

## 2021-07-08 DIAGNOSIS — Z170 Estrogen receptor positive status [ER+]: Secondary | ICD-10-CM | POA: Diagnosis not present

## 2021-07-08 DIAGNOSIS — Z79811 Long term (current) use of aromatase inhibitors: Secondary | ICD-10-CM | POA: Diagnosis not present

## 2021-07-08 DIAGNOSIS — E039 Hypothyroidism, unspecified: Secondary | ICD-10-CM | POA: Diagnosis not present

## 2021-07-08 DIAGNOSIS — C801 Malignant (primary) neoplasm, unspecified: Secondary | ICD-10-CM

## 2021-07-08 DIAGNOSIS — C3411 Malignant neoplasm of upper lobe, right bronchus or lung: Secondary | ICD-10-CM | POA: Diagnosis not present

## 2021-07-08 DIAGNOSIS — E119 Type 2 diabetes mellitus without complications: Secondary | ICD-10-CM | POA: Diagnosis not present

## 2021-07-08 DIAGNOSIS — Z79899 Other long term (current) drug therapy: Secondary | ICD-10-CM | POA: Insufficient documentation

## 2021-07-08 DIAGNOSIS — Z7982 Long term (current) use of aspirin: Secondary | ICD-10-CM | POA: Insufficient documentation

## 2021-07-08 DIAGNOSIS — Z85118 Personal history of other malignant neoplasm of bronchus and lung: Secondary | ICD-10-CM | POA: Diagnosis not present

## 2021-07-08 DIAGNOSIS — Z7984 Long term (current) use of oral hypoglycemic drugs: Secondary | ICD-10-CM | POA: Insufficient documentation

## 2021-07-08 DIAGNOSIS — M818 Other osteoporosis without current pathological fracture: Secondary | ICD-10-CM

## 2021-07-08 DIAGNOSIS — E785 Hyperlipidemia, unspecified: Secondary | ICD-10-CM | POA: Diagnosis not present

## 2021-07-08 DIAGNOSIS — Z923 Personal history of irradiation: Secondary | ICD-10-CM | POA: Insufficient documentation

## 2021-07-08 DIAGNOSIS — I110 Hypertensive heart disease with heart failure: Secondary | ICD-10-CM | POA: Diagnosis not present

## 2021-07-08 DIAGNOSIS — I252 Old myocardial infarction: Secondary | ICD-10-CM | POA: Diagnosis not present

## 2021-07-08 LAB — COMPREHENSIVE METABOLIC PANEL
ALT: 14 U/L (ref 0–44)
AST: 17 U/L (ref 15–41)
Albumin: 3.8 g/dL (ref 3.5–5.0)
Alkaline Phosphatase: 120 U/L (ref 38–126)
Anion gap: 10 (ref 5–15)
BUN: 13 mg/dL (ref 8–23)
CO2: 34 mmol/L — ABNORMAL HIGH (ref 22–32)
Calcium: 10.4 mg/dL — ABNORMAL HIGH (ref 8.9–10.3)
Chloride: 95 mmol/L — ABNORMAL LOW (ref 98–111)
Creatinine, Ser: 0.86 mg/dL (ref 0.44–1.00)
GFR, Estimated: >60 mL/min (ref >60)
Glucose, Bld: 102 mg/dL — ABNORMAL HIGH (ref 70–99)
Potassium: 4 mmol/L (ref 3.5–5.1)
Sodium: 139 mmol/L (ref 135–145)
Total Bilirubin: 0.7 mg/dL (ref 0.3–1.2)
Total Protein: 7.8 g/dL (ref 6.5–8.1)

## 2021-07-08 LAB — CBC WITH DIFFERENTIAL/PLATELET
Abs Immature Granulocytes: 0.02 10*3/uL (ref 0.00–0.07)
Basophils Absolute: 0.1 10*3/uL (ref 0.0–0.1)
Basophils Relative: 1 %
Eosinophils Absolute: 0.3 10*3/uL (ref 0.0–0.5)
Eosinophils Relative: 3 %
HCT: 51.7 % — ABNORMAL HIGH (ref 36.0–46.0)
Hemoglobin: 17.4 g/dL — ABNORMAL HIGH (ref 12.0–15.0)
Immature Granulocytes: 0 %
Lymphocytes Relative: 14 %
Lymphs Abs: 1.4 10*3/uL (ref 0.7–4.0)
MCH: 30.5 pg (ref 26.0–34.0)
MCHC: 33.7 g/dL (ref 30.0–36.0)
MCV: 90.7 fL (ref 80.0–100.0)
Monocytes Absolute: 0.8 10*3/uL (ref 0.1–1.0)
Monocytes Relative: 8 %
Neutro Abs: 7.1 10*3/uL (ref 1.7–7.7)
Neutrophils Relative %: 74 %
Platelets: 158 10*3/uL (ref 150–400)
RBC: 5.7 MIL/uL — ABNORMAL HIGH (ref 3.87–5.11)
RDW: 14.7 % (ref 11.5–15.5)
WBC: 9.7 10*3/uL (ref 4.0–10.5)
nRBC: 0 % (ref 0.0–0.2)

## 2021-07-14 ENCOUNTER — Other Ambulatory Visit: Payer: Medicare Other

## 2021-07-21 ENCOUNTER — Telehealth: Payer: Self-pay | Admitting: Cardiology

## 2021-07-21 NOTE — Telephone Encounter (Signed)
Spoke to the patient just now and let her know Dr. Joya Gaskins recommendations. She verbalizes understanding.    Encouraged patient to call back with any questions or concerns.

## 2021-07-21 NOTE — Telephone Encounter (Signed)
  Pt c/o medication issue:  1. Name of Medication: furosemide (LASIX) 40 MG tablet  2. How are you currently taking this medication (dosage and times per day)? Take one tablet by mouth daily. Take an extra tablet daily in the evening if your weight is above 146 lbs.  3. Are you having a reaction (difficulty breathing--STAT)?   4. What is your medication issue? Pt said, she was instructed by Dr. Bettina Gavia to take extra furosemide if her weight goes above 146 lbs, she said she lost a lot of weight, she is at 142 lbs now, but at night she gains weight like a pound. She wants to know if she needs to do the same things, if she is gaining a labs in a day if she needs to take an extra furosemide

## 2021-11-05 IMAGING — MG DIGITAL DIAGNOSTIC BILAT W/ TOMO W/ CAD
9 series · 9 of 25 positions shown · non-contrast
Comparison: Previous exams.

CLINICAL DATA: 75-year-old female with history left breast cancer
post lumpectomy November 2015.

EXAM:
DIGITAL DIAGNOSTIC BILATERAL MAMMOGRAM WITH TOMOSYNTHESIS AND CAD
TECHNIQUE: Bilateral digital diagnostic mammography and breast tomosynthesis
was performed. The images were evaluated with computer-aided
detection.

[L XCCL]
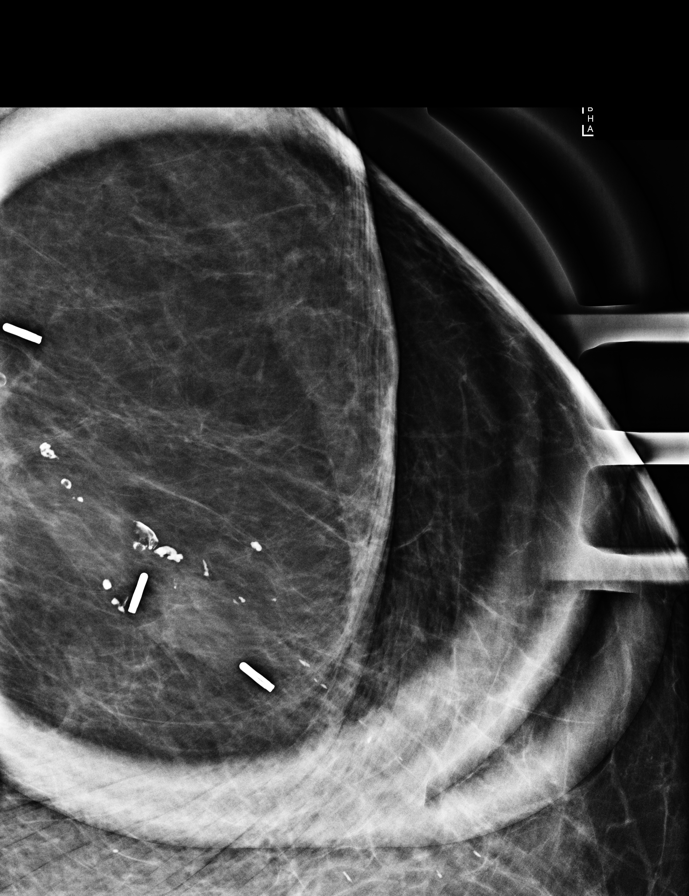

[L CC synth-2D]
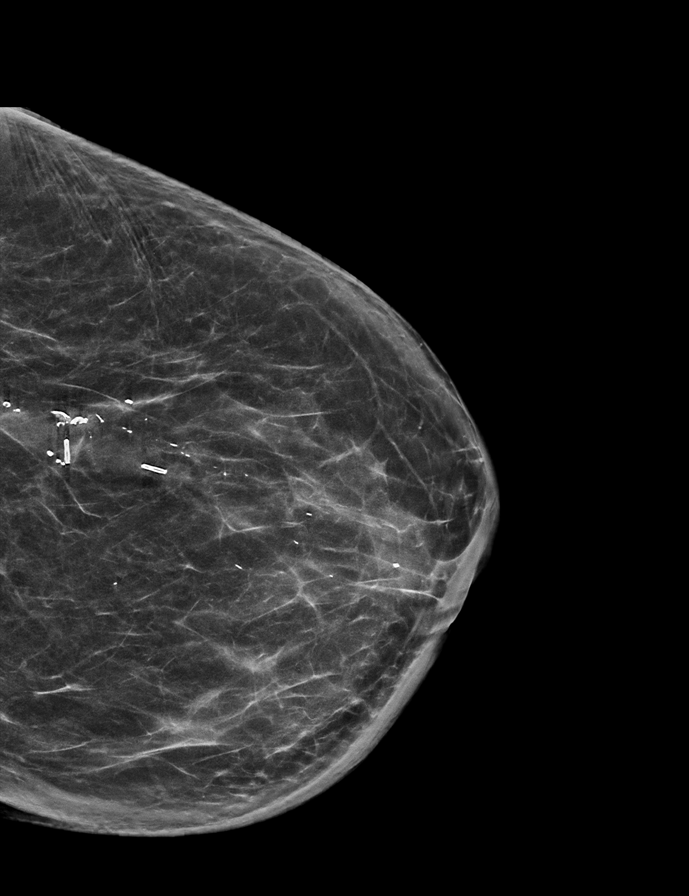

[R MLO synth-2D]
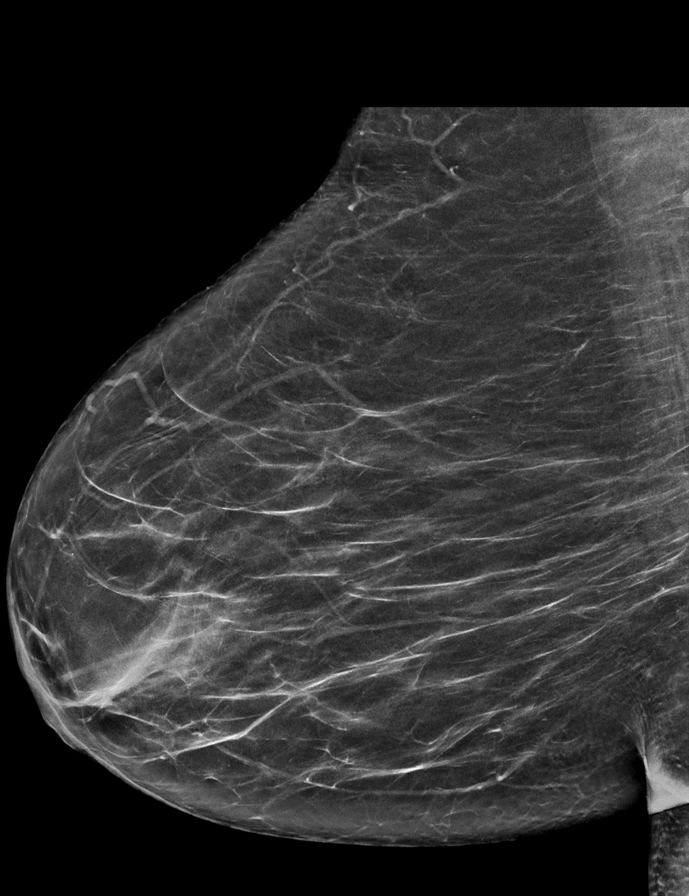

[R CC synth-2D]
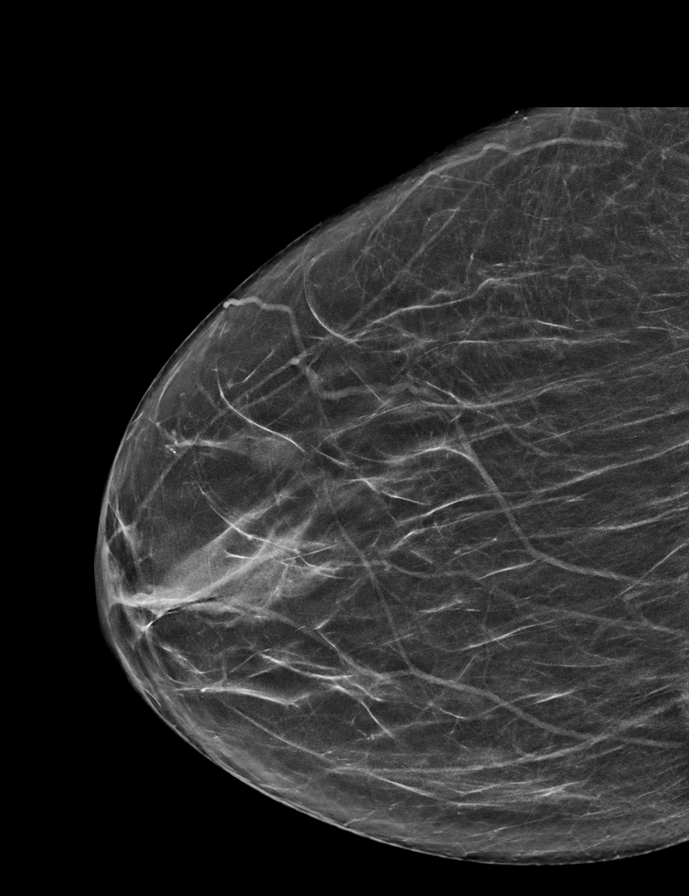

[L MLO synth-2D]
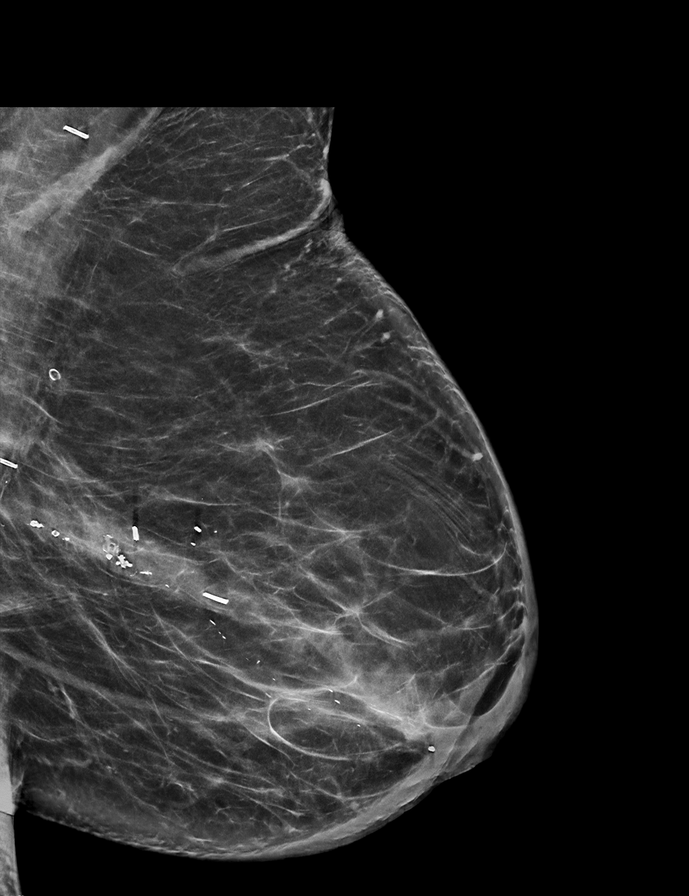

[L CC tomo · tomo slice 33/66.0]
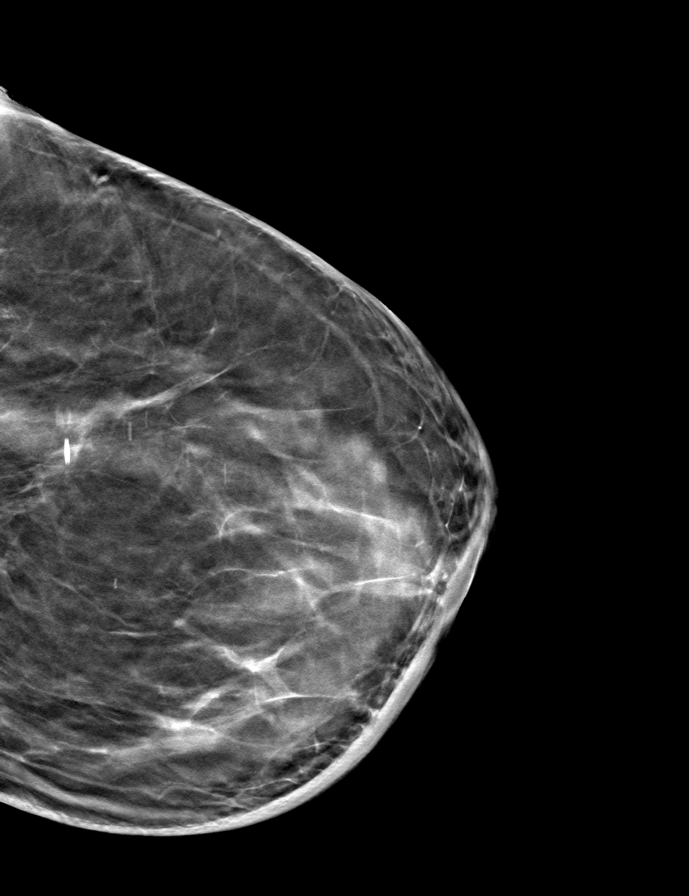

[L MLO tomo · tomo slice 35/70.0]
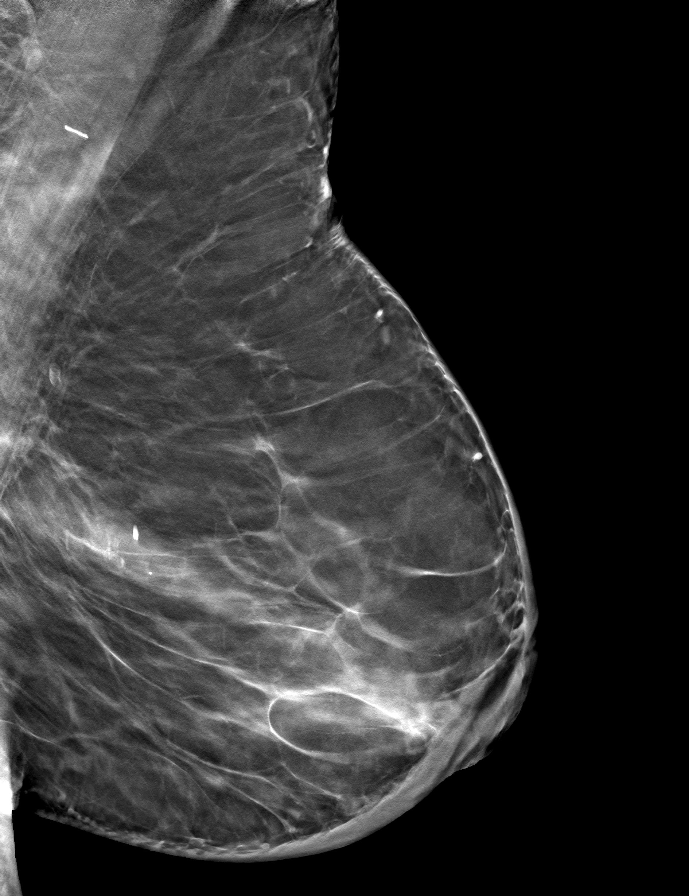

[R CC tomo · tomo slice 29/56.0]
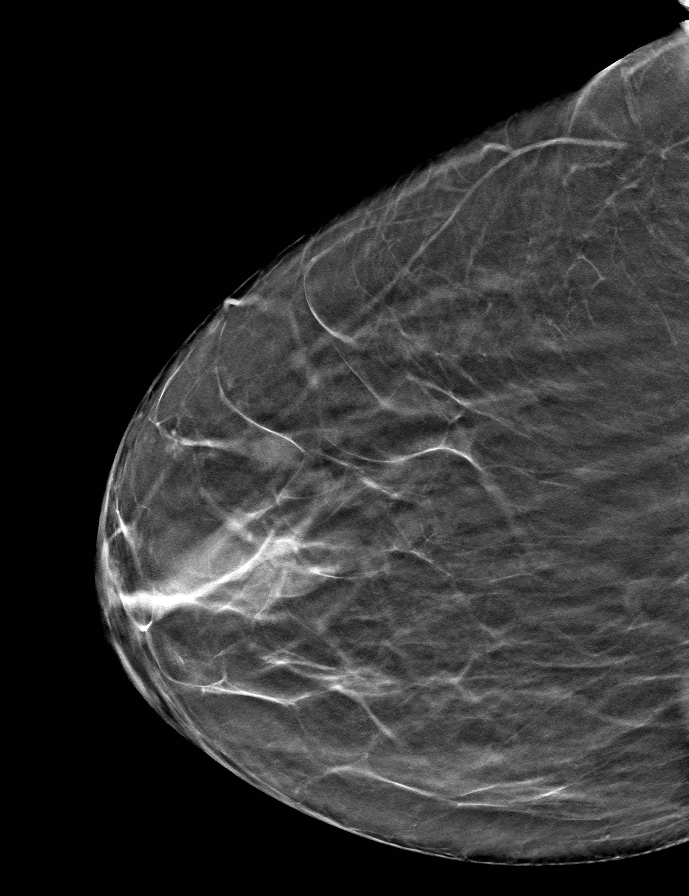

[R MLO tomo · tomo slice 31/61.0]
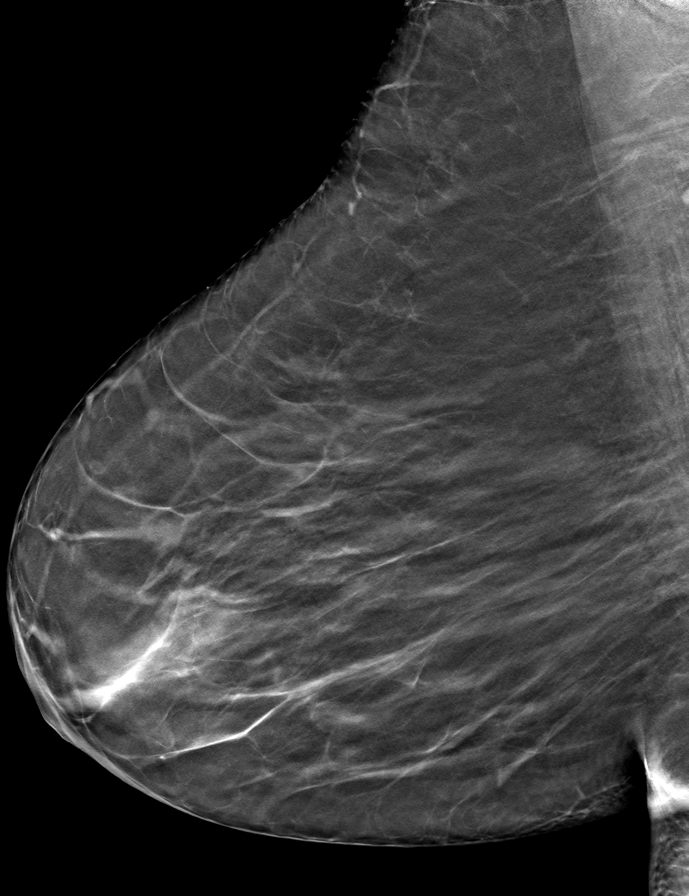

[9 of 25 positions shown; findings below may reference images not displayed]

ACR Breast Density Category b: There are scattered areas of
fibroglandular density.
FINDINGS: No suspicious masses or calcifications are seen in either breast.
Lumpectomy changes again identified within the central left breast.
Spot compression magnification CC view of the left breast lumpectomy
site was performed. There is no mammographic evidence of locally
recurrent malignancy.
IMPRESSION: Stable lumpectomy changes in the left breast. No mammographic
evidence of malignancy in either breast.

RECOMMENDATION:
Screening mammogram in one year.(Code:[id number]-E-85T)

I have discussed the findings and recommendations with the patient.
If applicable, a reminder letter will be sent to the patient
regarding the next appointment.

BI-RADS CATEGORY  2: Benign.

## 2021-12-04 ENCOUNTER — Encounter: Payer: Self-pay | Admitting: Cardiology

## 2021-12-04 ENCOUNTER — Ambulatory Visit: Payer: Medicare Other | Admitting: Cardiology

## 2021-12-04 VITALS — BP 136/78 | HR 68 | Ht 63.0 in | Wt 139.0 lb

## 2021-12-04 DIAGNOSIS — E782 Mixed hyperlipidemia: Secondary | ICD-10-CM

## 2021-12-04 DIAGNOSIS — I110 Hypertensive heart disease with heart failure: Secondary | ICD-10-CM

## 2021-12-04 DIAGNOSIS — I25118 Atherosclerotic heart disease of native coronary artery with other forms of angina pectoris: Secondary | ICD-10-CM

## 2021-12-04 DIAGNOSIS — I10 Essential (primary) hypertension: Secondary | ICD-10-CM

## 2021-12-04 MED ORDER — FUROSEMIDE 40 MG PO TABS: 40.0000 mg | ORAL_TABLET | Freq: Two times a day (BID) | ORAL | 3 refills | Status: DC

## 2021-12-04 MED ORDER — POTASSIUM CHLORIDE CRYS ER 20 MEQ PO TBCR: 20.0000 meq | EXTENDED_RELEASE_TABLET | Freq: Two times a day (BID) | ORAL | 3 refills | Status: DC

## 2021-12-04 NOTE — Patient Instructions (Signed)
Medication Instructions:  ?Your physician has recommended you make the following change in your medication:  ? ?START: Furosemide 40 mg twice daily ?START: Potassium  20 meq twice daily ? ?*If you need a refill on your cardiac medications before your next appointment, please call your pharmacy* ? ? ?Lab Work: ?Your physician recommends that you return for lab work in:  ? ?Labs today in [address]: BMP, Pro BNP, Lipids ? ?If you have labs (blood work) drawn today and your tests are completely normal, you will receive your results only by: ?MyChart Message (if you have MyChart) OR ?A paper copy in the mail ?If you have any lab test that is abnormal or we need to change your treatment, we will call you to review the results. ? ? ?Testing/Procedures: ?None ? ? ?Follow-Up: ?At [facility], you and your health needs are our priority.  As part of our continuing mission to provide you with exceptional heart care, we have created designated Provider Care Teams.  These Care Teams include your primary Cardiologist (physician) and Advanced Practice Providers (APPs -  Physician Assistants and Nurse Practitioners) who all work together to provide you with the care you need, when you need it. ? ?We recommend signing up for the patient portal called "MyChart".  Sign up information is provided on this After Visit Summary.  MyChart is used to connect with patients for Virtual Visits (Telemedicine).  Patients are able to view lab/test results, encounter notes, upcoming appointments, etc.  Non-urgent messages can be sent to your provider as well.   ?To learn more about what you can do with MyChart, go to [email or web address].   ? ?Your next appointment:   ?6 month(s) ? ?The format for your next appointment:   ?In Person ? ?Provider:   ?Shirlee More, MD  ? ? ?Other Instructions ?If weight is 143 or greater take a third furosemide and potassium pill. ? ?Important Information About Sugar ? ? ? ? ? ? ?

## 2021-12-04 NOTE — Progress Notes (Signed)
?Cardiology Office Note:   ? ?Date:  12/04/2021  ? ?ID:  Denise Mckenzie, DOB [birth year], MRN [id number] ? ?PCP:  Lorene Dy, MD  ?Cardiologist:  Shirlee More, MD   ? ?Referring MD: Lorene Dy, MD  ? ? ?ASSESSMENT:   ? ?1. Hypertensive heart disease with heart failure ([place])   ?2. Coronary artery disease of native artery of native heart with stable angina pectoris ([place])   ?3. Mixed hyperlipidemia   ?4. Primary hypertension   ? ?PLAN:   ? ?In order of problems listed above: ? ?Her heart failure is markedly improved post diuresis her weight is down over 20 pounds total from this point forward she will continue potassium diuretic twice daily and if her weight goes to 143 pounds she will take a third dose of each.  We will go ahead and recheck renal function proBNP and take her SGLT2 inhibitor quite effective for diastolic heart failure. ?Stable CAD no anginal discomfort continue medical treatment including aspirin metoprolol and her statin. ?Continue high intensity statin CAD we will recheck her lipids today along with CMP ?Well controlled continue her diuretic beta-blocker ? ? ?Next appointment: 6 months ? ? ?Medication Adjustments/Labs and Tests Ordered: ?Current medicines are reviewed at length with the patient today.  Concerns regarding medicines are outlined above.  ?No orders of the defined types were placed in this encounter. ? ?No orders of the defined types were placed in this encounter. ? ? ?Chief Complaint  ?Patient presents with  ? Follow-up  ? Congestive Heart Failure  ? ? ?History of Present Illness:   ? ?Denise Mckenzie is a 76 y.o. female with a hx of  CAD with CABG in 1996 hypertension hyperlipidemia diabetes mellitus breast cancer and lung cancer seen 61/60/7371 for diastolic heart failure noted on chest CT scan.  She was continued on a loop diuretic with clinical improvement and SGLT2 inhibitor was initiated.  She wants last seen 05/29/2021. ? ?She had a myocardial perfusion study performed March  2015 showed normal exercise tolerance normal blood pressure response normal myocardial perfusion LV contractility and calculated ejection fraction of 79% ?  ?She had an echocardiogram performed 04/28/2021 left ventricle was normal in size wall thickness EF 55 to 60% with grade 1 diastolic dysfunction and normal diastolic filling pressure right ventricle is mildly enlarged with normal function and there is no significant valvular abnormality  ? ?Previously she had been followed with Dr. Dorris Carnes heart care with a history of CAD CABG in 1996 hypertension hyperlipidemia and diabetes mellitus. ? ?Compliance with diet, lifestyle and medications: Yes ? ?She is very meticulous caring for herself she fully sodium restricts weighs daily and is doing very well with her weight plateaued in the range of 139 to 141 pounds taking furosemide twice daily ?She is a new person says she is 100% improved no longer short of breath no edema chest pain palpitation or syncope ?Past Medical History:  ?Diagnosis Date  ? Arthritis   ? Breast cancer ([place]) dx'd 09/2015  ? COPD (chronic obstructive pulmonary disease) ([place])   ? Coronary artery disease   ? Depression   ? Diabetes mellitus ([place]) 10/22/2015  ? Full dentures   ? full upper dentures, plate on bottom  ? History of bronchitis   ? History of pneumonia   ? Hyperlipidemia   ? Hypertension   ? Hypothyroidism 10/22/2015  ? lung ca dx;d 09/2015  ? lung cancer  ? Myocardial infarction [facility]) July 1995  ? Osteoporosis 10/22/2015  ?  Personal history of radiation therapy   ? Stress incontinence   ? Uterine cancer ([place]) dx'd 1968  ? ? ?Past Surgical History:  ?Procedure Laterality Date  ? ABDOMINAL HYSTERECTOMY    ? 1968  ? APPENDECTOMY    ? with hysterectomy  ? BLADDER SURGERY    ? 1972  ? BREAST BIOPSY Left 11/04/2015  ? Malignant  ? BREAST BIOPSY Left 11/04/2015  ? Malignant  ? BREAST LUMPECTOMY Left 11/19/2015  ? [place]  ? Dr. Wynonia Lawman saw here then here at [facility] both times;  'they did a balloon and opened it up' first time, the 2nd was scheduled d/t abnormal stress test  ? COLONOSCOPY W/ POLYPECTOMY    ? CORONARY ARTERY BYPASS GRAFT    ? 11/1994  ? feet surgery    ? 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 LUNG BIOPSY N/A 11/13/2015  ? Procedure: RIGHT LUNG BIOPSY;  Surgeon: Grace Isaac, MD;  Location: [place];  Service: Thoracic;  Laterality: N/A;  ? MULTIPLE TOOTH EXTRACTIONS    ? RADIOACTIVE SEED GUIDED PARTIAL MASTECTOMY WITH AXILLARY SENTINEL LYMPH NODE BIOPSY Left 11/19/2015  ? Procedure: RADIOACTIVE SEED GUIDED PARTIAL MASTECTOMY WITH AXILLARY SENTINEL LYMPH NODE BIOPSY;  Surgeon: Donnie Mesa, MD;  Location: [place];  Service: General;  Laterality: Left;  ? RE-EXCISION OF BREAST LUMPECTOMY Left 12/03/2015  ? Procedure: RE-EXCISION INFERIOR MARGIN OF LEFT BREAST LUMPECTOMY;  Surgeon: Donnie Mesa, MD;  Location: [place];  Service: General;  Laterality: Left;  ? TMJ ARTHROPLASTY    ? 1979  ? TONSILLECTOMY    ? VIDEO BRONCHOSCOPY WITH ENDOBRONCHIAL NAVIGATION N/A 11/13/2015  ? Procedure: VIDEO BRONCHOSCOPY WITH ENDOBRONCHIAL NAVIGATION, with placement of fudicial markers;  Surgeon: Grace Isaac, MD;  Location: [place];  Service: Thoracic;  Laterality: N/A;  ? ? ?Current Medications: ?Current Meds  ?Medication Sig  ? acetaminophen (TYLENOL) 500 MG tablet Take 1,000 mg by mouth 2 (two) times daily as needed (pain). Reported on 01/20/2016  ? albuterol (VENTOLIN HFA) 108 (90 Base) MCG/ACT inhaler Inhale 2 puffs into the lungs 4 (four) times daily as needed for shortness of breath.  ? alendronate (FOSAMAX) 70 MG tablet TAKE 1 TABLET BY MOUTH ONCE A WEEK. TAKE WITH FULL GLASS OF WATER ON EMPTY STOMACH.  ? amitriptyline (ELAVIL) 50 MG tablet Take 50 mg by mouth at bedtime.    ? aspirin EC 81 MG tablet Take 1 tablet (81 mg total) by mouth daily. Swallow whole.  ? atorvastatin (LIPITOR) 40 MG tablet Take 40 mg by mouth at bedtime.   ? Calcium Citrate-Vitamin  D (CALCIUM + D PO) Take 2 tablets by mouth 3 (three) times daily. Each tablet Calcium 300 mg, Vitamin D3 250 I.U.  ? dapagliflozin propanediol (FARXIGA) 10 MG TABS tablet Take 1 tablet (10 mg total) by mouth daily before breakfast.  ? furosemide (LASIX) 40 MG tablet Take one tablet by mouth daily. Take an extra tablet daily in the evening if your weight is above 146 lbs.  ? ketoconazole (NIZORAL) 2 % cream Apply 1 application topically daily.  ? KLOR-CON M20 20 MEQ tablet Take 1 tablet (20 mEq total) by mouth daily.  ? Krill Oil 1000 MG CAPS Take 1,000 mg by mouth daily.  ? levothyroxine (SYNTHROID, LEVOTHROID) 88 MCG tablet Take 88 mcg by mouth daily before breakfast.   ? metFORMIN (GLUCOPHAGE) 500 MG tablet Take 500 mg by mouth daily.  ? metoprolol  succinate (TOPROL-XL) 50 MG 24 hr tablet Take 50 mg by mouth daily.  ? nitroGLYCERIN (NITROSTAT) 0.4 MG SL tablet Place 1 tablet (0.4 mg total) under the tongue every 5 (five) minutes as needed for chest pain. Reported on 01/28/2016  ?  ? ?Allergies:   Patient has no known allergies.  ? ?Social History  ? ?Socioeconomic History  ? Marital status: Married  ?  Spouse name: Not on file  ? Number of children: Not on file  ? Years of education: Not on file  ? Highest education level: Not on file  ?Occupational History  ? Not on file  ?Tobacco Use  ? Smoking status: Former  ?  Types: Cigarettes  ?  Quit date: 08/17/1994  ?  Years since quitting: 27.3  ?  Passive exposure: Past  ? Smokeless tobacco: Never  ?Vaping Use  ? Vaping Use: Never used  ?Substance and Sexual Activity  ? Alcohol use: No  ? Drug use: No  ? Sexual activity: Not on file  ?Other Topics Concern  ? Not on file  ?Social History Narrative  ? Not on file  ? ?Social Determinants of Health  ? ?Financial Resource Strain: Not on file  ?Food Insecurity: Not on file  ?Transportation Needs: Not on file  ?Physical Activity: Not on file  ?Stress: Not on file  ?Social Connections: Not on file  ?  ? ?Family History: ?The  patient's family history includes Alzheimer's disease in her mother; Heart attack in her father. There is no history of Breast cancer. ?ROS:   ?Please see the history of present illness.    ?All other sys

## 2021-12-05 ENCOUNTER — Other Ambulatory Visit: Payer: Self-pay | Admitting: Obstetrics and Gynecology

## 2021-12-05 ENCOUNTER — Other Ambulatory Visit: Payer: Self-pay | Admitting: Internal Medicine

## 2021-12-05 DIAGNOSIS — M810 Age-related osteoporosis without current pathological fracture: Secondary | ICD-10-CM

## 2021-12-05 DIAGNOSIS — C50412 Malignant neoplasm of upper-outer quadrant of left female breast: Secondary | ICD-10-CM

## 2021-12-06 LAB — LIPID PANEL
Chol/HDL Ratio: 3.1 ratio (ref 0.0–4.4)
Cholesterol, Total: 134 mg/dL (ref 100–199)
HDL: 43 mg/dL (ref >39)
LDL Chol Calc (NIH): 72 mg/dL (ref 0–99)
Triglycerides: 104 mg/dL (ref 0–149)
VLDL Cholesterol Cal: 19 mg/dL (ref 5–40)

## 2021-12-06 LAB — BASIC METABOLIC PANEL
BUN/Creatinine Ratio: 17 (ref 12–28)
BUN: 15 mg/dL (ref 8–27)
CO2: 28 mmol/L (ref 20–29)
Calcium: 10.2 mg/dL (ref 8.7–10.3)
Chloride: 97 mmol/L (ref 96–106)
Creatinine, Ser: 0.89 mg/dL (ref 0.57–1.00)
Glucose: 92 mg/dL (ref 70–99)
Potassium: 4.6 mmol/L (ref 3.5–5.2)
Sodium: 141 mmol/L (ref 134–144)
eGFR: 67 mL/min/{1.73_m2} (ref >59)

## 2021-12-06 LAB — PRO B NATRIURETIC PEPTIDE: NT-Pro BNP: 289 pg/mL (ref 0–738)

## 2021-12-09 ENCOUNTER — Other Ambulatory Visit: Payer: Medicare Other

## 2021-12-09 ENCOUNTER — Ambulatory Visit
Admission: RE | Admit: 2021-12-09 | Discharge: 2021-12-09 | Disposition: A | Discharge status: Home / Self Care | Payer: Medicare Other | Source: Ambulatory Visit | Attending: Internal Medicine | Admitting: Internal Medicine

## 2021-12-09 DIAGNOSIS — C50412 Malignant neoplasm of upper-outer quadrant of left female breast: Secondary | ICD-10-CM

## 2021-12-09 DIAGNOSIS — M810 Age-related osteoporosis without current pathological fracture: Secondary | ICD-10-CM

## 2022-03-06 ENCOUNTER — Other Ambulatory Visit: Payer: Self-pay | Admitting: Internal Medicine

## 2022-03-06 DIAGNOSIS — Z1231 Encounter for screening mammogram for malignant neoplasm of breast: Secondary | ICD-10-CM

## 2022-04-03 ENCOUNTER — Other Ambulatory Visit: Payer: Self-pay | Admitting: Cardiology

## 2022-04-13 ENCOUNTER — Ambulatory Visit
Admission: RE | Admit: 2022-04-13 | Discharge: 2022-04-13 | Disposition: A | Discharge status: Home / Self Care | Payer: Medicare Other | Source: Ambulatory Visit | Attending: Internal Medicine | Admitting: Internal Medicine

## 2022-04-13 DIAGNOSIS — Z1231 Encounter for screening mammogram for malignant neoplasm of breast: Secondary | ICD-10-CM

## 2022-04-16 ENCOUNTER — Other Ambulatory Visit: Payer: Self-pay | Admitting: Internal Medicine

## 2022-04-16 DIAGNOSIS — R928 Other abnormal and inconclusive findings on diagnostic imaging of breast: Secondary | ICD-10-CM

## 2022-05-04 ENCOUNTER — Ambulatory Visit
Admission: RE | Admit: 2022-05-04 | Discharge: 2022-05-04 | Disposition: A | Discharge status: Home / Self Care | Payer: Medicare Other | Source: Ambulatory Visit | Attending: Internal Medicine | Admitting: Internal Medicine

## 2022-05-04 ENCOUNTER — Ambulatory Visit: Payer: Medicare Other

## 2022-05-04 DIAGNOSIS — R928 Other abnormal and inconclusive findings on diagnostic imaging of breast: Secondary | ICD-10-CM

## 2022-07-07 ENCOUNTER — Ambulatory Visit: Payer: Medicare Other | Admitting: Cardiology

## 2022-07-15 ENCOUNTER — Ambulatory Visit: Payer: Medicare Other | Admitting: Physician Assistant

## 2022-07-22 ENCOUNTER — Encounter: Payer: Self-pay | Admitting: Cardiology

## 2022-07-22 ENCOUNTER — Ambulatory Visit: Payer: Medicare Other | Attending: Cardiology | Admitting: Cardiology

## 2022-07-22 VITALS — BP 114/72 | HR 76 | Ht 62.0 in | Wt 134.0 lb

## 2022-07-22 DIAGNOSIS — I700 Atherosclerosis of aorta: Secondary | ICD-10-CM

## 2022-07-22 DIAGNOSIS — I25118 Atherosclerotic heart disease of native coronary artery with other forms of angina pectoris: Secondary | ICD-10-CM

## 2022-07-22 DIAGNOSIS — R0602 Shortness of breath: Secondary | ICD-10-CM

## 2022-07-22 DIAGNOSIS — I5032 Chronic diastolic (congestive) heart failure: Secondary | ICD-10-CM

## 2022-07-22 DIAGNOSIS — I5033 Acute on chronic diastolic (congestive) heart failure: Secondary | ICD-10-CM | POA: Insufficient documentation

## 2022-07-22 DIAGNOSIS — E119 Type 2 diabetes mellitus without complications: Secondary | ICD-10-CM | POA: Diagnosis not present

## 2022-07-22 NOTE — Patient Instructions (Signed)

## 2022-07-22 NOTE — Progress Notes (Signed)
Cardiology Office Note:    Date:  07/22/2022   ID:  Denise Mckenzie, DOB [birth year], MRN [id number]  PCP:  Lorene Dy, MD  Cardiologist:  Jenne Campus, MD    Referring MD: Lorene Dy, MD   Chief Complaint  Patient presents with   Follow-up  Doing very well  History of Present Illness:    Denise Mckenzie is a 76 y.o. female with past medical history significant for coronary artery disease, history of coronary artery bypass grafting 1996, hypertension, hyperlipidemia, diabetes, history of breast cancer and lung cancer.  She was seen previously by my partner Dr. Bettina Gavia, she has been put on diuretics as well as SGLT2 inhibitors and was doing very well comes today to my office still doing very well.  Denies of any chest pain tightness squeezing pressure burning chest, she tried to exercise on the regular basis walking on the treadmill watching squirrels and rabbits outside.  Denies having any chest pain tightness squeezing pressure burning chest shortness of breath  Past Medical History:  Diagnosis Date   Arthritis    Breast cancer ([place]) dx'd 09/2015   COPD (chronic obstructive pulmonary disease) ([facility])    Coronary artery disease    Depression    Diabetes mellitus ([place]) 10/22/2015   Full dentures    full upper dentures, plate on bottom   History of bronchitis    History of pneumonia    Hyperlipidemia    Hypertension    Hypothyroidism 10/22/2015   lung ca dx;d 09/2015   lung cancer   Myocardial infarction [facility]) July 1995   Osteoporosis 10/22/2015   Personal history of radiation therapy    Stress incontinence    Uterine cancer ([place]) dx'd 1968    Past Surgical History:  Procedure Laterality Date   ABDOMINAL HYSTERECTOMY     1968   APPENDECTOMY     with hysterectomy   BLADDER SURGERY     1972   BREAST BIOPSY Left 11/04/2015   Malignant   BREAST BIOPSY Left 11/04/2015   Malignant   BREAST LUMPECTOMY Left 11/19/2015   CARDIAC CATHETERIZATION  1995, 1996   Dr. 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   LUNG BIOPSY N/A 11/13/2015   Procedure: RIGHT LUNG BIOPSY;  Surgeon: Grace Isaac, MD;  Location: [place];  Service: Thoracic;  Laterality: N/A;   MULTIPLE TOOTH EXTRACTIONS     RADIOACTIVE SEED GUIDED PARTIAL MASTECTOMY WITH AXILLARY SENTINEL LYMPH NODE BIOPSY Left 11/19/2015   Procedure: RADIOACTIVE SEED GUIDED PARTIAL MASTECTOMY WITH AXILLARY SENTINEL LYMPH NODE BIOPSY;  Surgeon: Donnie Mesa, MD;  Location: [place];  Service: General;  Laterality: Left;   RE-EXCISION OF BREAST LUMPECTOMY Left 12/03/2015   Procedure: RE-EXCISION INFERIOR MARGIN OF LEFT BREAST LUMPECTOMY;  Surgeon: Donnie Mesa, MD;  Location: [place];  Service: General;  Laterality: Left;   TMJ ARTHROPLASTY     1979   TONSILLECTOMY     VIDEO BRONCHOSCOPY WITH ENDOBRONCHIAL NAVIGATION N/A 11/13/2015   Procedure: VIDEO BRONCHOSCOPY WITH ENDOBRONCHIAL NAVIGATION, with placement of fudicial markers;  Surgeon: Grace Isaac, MD;  [facility];  Service: Thoracic;  Laterality: N/A;    Current  Medications: Current Meds  Medication Sig   acetaminophen (TYLENOL) 500 MG tablet Take 1,000 mg by mouth 2 (two) times daily as needed (pain). Reported on 01/20/2016   albuterol (VENTOLIN HFA) 108 (90 Base) MCG/ACT inhaler Inhale 2 puffs into the lungs 4 (four) times daily as needed for shortness of breath.   alendronate (FOSAMAX) 70 MG tablet TAKE 1 TABLET BY MOUTH ONCE A WEEK. TAKE WITH FULL GLASS OF WATER ON EMPTY STOMACH. (Patient taking differently: Take 70 mg by mouth once a week.)   amitriptyline (ELAVIL) 50 MG tablet Take 50 mg by mouth at bedtime.     aspirin EC 81 MG tablet Take 1 tablet (81 mg total) by mouth daily. Swallow whole.    atorvastatin (LIPITOR) 40 MG tablet Take 40 mg by mouth at bedtime.    Calcium Citrate-Vitamin D (CALCIUM + D PO) Take 2 tablets by mouth 3 (three) times daily. Each tablet Calcium 300 mg, Vitamin D3 250 I.U.   dapagliflozin propanediol (FARXIGA) 10 MG TABS tablet Take 1 tablet (10 mg total) by mouth daily before breakfast.   furosemide (LASIX) 40 MG tablet Take 1 tablet (40 mg total) by mouth 2 (two) times daily.   ketoconazole (NIZORAL) 2 % cream Apply 1 application topically daily.   Krill Oil 1000 MG CAPS Take 1,000 mg by mouth daily.   levothyroxine (SYNTHROID, LEVOTHROID) 88 MCG tablet Take 88 mcg by mouth daily before breakfast.    metFORMIN (GLUCOPHAGE) 500 MG tablet Take 500 mg by mouth daily.   metoprolol succinate (TOPROL-XL) 50 MG 24 hr tablet Take 50 mg by mouth daily.   nitroGLYCERIN (NITROSTAT) 0.4 MG SL tablet Place 1 tablet (0.4 mg total) under the tongue every 5 (five) minutes as needed for chest pain. Reported on 01/28/2016   potassium chloride SA (KLOR-CON M) 20 MEQ tablet Take 1 tablet (20 mEq total) by mouth 2 (two) times daily.     Allergies:   Patient has no known allergies.   Social History   Socioeconomic History   Marital status: Married    Spouse name: Not on file   Number of children: Not on file   Years of education: Not on file   Highest education level: Not on file  Occupational History   Not on file  Tobacco Use   Smoking status: Former    Types: Cigarettes    Quit date: 08/17/1994    Years since quitting: 27.9    Passive exposure: Past   Smokeless tobacco: Never  Vaping Use   Vaping Use: Never used  Substance and Sexual Activity   Alcohol use: No   Drug use: No   Sexual activity: Not on file  Other Topics Concern   Not on file  Social History Narrative   Not on file   Social Determinants of Health   Financial Resource Strain: Not on file  Food Insecurity: Not on file  Transportation Needs: Not on file  Physical Activity: Not on file   Stress: Not on file  Social Connections: Not on file     Family History: The patient's family history includes Alzheimer's disease in her mother; Heart attack in her father. There is no history of Breast cancer. ROS:   Please see the history of present illness.    All 14 point review of systems negative except as described per history of present illness  EKGs/Labs/Other Studies Reviewed:      Recent Labs: 12/05/2021: BUN 15; Creatinine, Ser 0.89; NT-Pro BNP 289; Potassium 4.6;  Sodium 141  Recent Lipid Panel    Component Value Date/Time   CHOL 134 12/05/2021 1014   TRIG 104 12/05/2021 1014   HDL 43 12/05/2021 1014   CHOLHDL 3.1 12/05/2021 1014   LDLCALC 72 12/05/2021 1014    Physical Exam:    VS:  BP 114/72 (BP Location: Left Arm, Patient Position: Sitting)   Pulse 76   Ht 5\' 2"  (1.575 m)   Wt 134 lb (60.8 kg)   SpO2 95%   BMI 24.51 kg/m     Wt Readings from Last 3 Encounters:  07/22/22 134 lb (60.8 kg)  12/04/21 139 lb (63 kg)  07/08/21 144 lb 1.6 oz (65.4 kg)     GEN:  Well nourished, well developed in no acute distress HEENT: Normal NECK: No JVD; No carotid bruits LYMPHATICS: No lymphadenopathy CARDIAC: RRR, no murmurs, no rubs, no gallops RESPIRATORY:  Clear to auscultation without rales, wheezing or rhonchi  ABDOMEN: Soft, non-tender, non-distended MUSCULOSKELETAL:  No edema; No deformity  SKIN: Warm and dry LOWER EXTREMITIES: no swelling NEUROLOGIC:  Alert and oriented x 3 PSYCHIATRIC:  Normal affect   ASSESSMENT:    1. Coronary artery disease of native artery of native heart with stable angina pectoris ([place])   2. Diabetes mellitus without complication ([place])   3. Chronic diastolic (congestive) heart failure ([place])   4. Aortic atherosclerosis ([facility])    PLAN:    In order of problems listed above:  Coronary artery disease stable from that point review.  On antiplatelet therapy which I will continue. Diastolic congestive heart failure compensated on  physical exam.  I will get her echocardiogram to assess left ventricle ejection fraction. Diabetes mellitus to be followed by antimedicine team.  Apparently stable, but I do not have her hemoglobin A1c.  In January she is going to her primary care physician Dyslipidemia: I did review her K PN which show me her LDL 72 HDL 43 this is from December 05, 2021.  Will continue Lipitor 40 which is high intensity statin   Medication Adjustments/Labs and Tests Ordered: Current medicines are reviewed at length with the patient today.  Concerns regarding medicines are outlined above.  No orders of the defined types were placed in this encounter.  Medication changes: No orders of the defined types were placed in this encounter.   Signed, Park Liter, MD, [facility] 07/22/2022 10:02 AM    [place]

## 2022-07-22 NOTE — Addendum Note (Signed)
Addended by: Jacobo Forest D on: 07/22/2022 10:12 AM   Modules accepted: Orders

## 2022-08-24 ENCOUNTER — Ambulatory Visit (HOSPITAL_BASED_OUTPATIENT_CLINIC_OR_DEPARTMENT_OTHER)
Admission: RE | Admit: 2022-08-24 | Discharge: 2022-08-24 | Disposition: A | Discharge status: Home / Self Care | Payer: Medicare Other | Source: Ambulatory Visit | Attending: Cardiology | Admitting: Cardiology

## 2022-08-24 DIAGNOSIS — R0602 Shortness of breath: Secondary | ICD-10-CM | POA: Diagnosis not present

## 2022-08-24 DIAGNOSIS — I5032 Chronic diastolic (congestive) heart failure: Secondary | ICD-10-CM | POA: Diagnosis not present

## 2022-08-25 LAB — ECHOCARDIOGRAM COMPLETE
AR max vel: 1.98 cm2
AV Area VTI: 2.47 cm2
AV Area mean vel: 2.22 cm2
AV Mean grad: 2 mmHg
AV Peak grad: 4 mmHg
Ao pk vel: 1.01 m/s
Area-P 1/2: 4.01 cm2
Calc EF: 54.7 %
S' Lateral: 2.7 cm
Single Plane A2C EF: 53.8 %
Single Plane A4C EF: 57.4 %

## 2022-08-27 ENCOUNTER — Telehealth: Payer: Self-pay

## 2022-08-27 NOTE — Telephone Encounter (Signed)
-----   Message from Park Liter, MD sent at 08/27/2022 12:16 PM EST ----- Echocardiogram showed preserved left ventricle ejection fraction, trivial mitral valve regurgitation, overall looks good

## 2022-08-27 NOTE — Telephone Encounter (Signed)
Patient notified of results though my chart.

## 2022-10-02 ENCOUNTER — Other Ambulatory Visit: Payer: Self-pay | Admitting: Cardiology

## 2022-10-02 NOTE — Telephone Encounter (Signed)
Refill sent to [id number] - [place], [place] - [place]

## 2022-12-17 ENCOUNTER — Other Ambulatory Visit: Payer: Self-pay | Admitting: Cardiology

## 2023-01-14 ENCOUNTER — Other Ambulatory Visit: Payer: Self-pay | Admitting: Cardiology

## 2023-01-27 ENCOUNTER — Ambulatory Visit (HOSPITAL_BASED_OUTPATIENT_CLINIC_OR_DEPARTMENT_OTHER)
Admission: RE | Admit: 2023-01-27 | Discharge: 2023-01-27 | Disposition: A | Discharge status: Home / Self Care | Payer: Medicare Other | Source: Ambulatory Visit | Attending: Cardiology | Admitting: Cardiology

## 2023-01-27 ENCOUNTER — Encounter: Payer: Self-pay | Admitting: Cardiology

## 2023-01-27 ENCOUNTER — Ambulatory Visit: Payer: Medicare Other | Attending: Cardiology | Admitting: Cardiology

## 2023-01-27 VITALS — BP 140/72 | HR 74 | Ht 62.0 in | Wt 129.0 lb

## 2023-01-27 DIAGNOSIS — I25118 Atherosclerotic heart disease of native coronary artery with other forms of angina pectoris: Secondary | ICD-10-CM

## 2023-01-27 DIAGNOSIS — I5032 Chronic diastolic (congestive) heart failure: Secondary | ICD-10-CM | POA: Insufficient documentation

## 2023-01-27 DIAGNOSIS — J410 Simple chronic bronchitis: Secondary | ICD-10-CM | POA: Diagnosis not present

## 2023-01-27 DIAGNOSIS — E119 Type 2 diabetes mellitus without complications: Secondary | ICD-10-CM | POA: Insufficient documentation

## 2023-01-27 DIAGNOSIS — Z7984 Long term (current) use of oral hypoglycemic drugs: Secondary | ICD-10-CM

## 2023-01-27 NOTE — Progress Notes (Signed)
Cardiology Office Note:    Date:  01/27/2023   ID:  Denise Mckenzie, DOB October 12, 1945, MRN [id number]  PCP:  Burton Apley, MD  Cardiologist:  Gypsy Balsam, MD    Referring MD: Burton Apley, MD     History of Present Illness:    Denise Mckenzie is a 77 y.o. female past medical history significant for coronary artery disease, history of coronary bypass grafting 1996, essential hypertension, hyperlipidemia, diabetes history of breast and lung cancer.  She is doing quite well when I see her last time today however she complained of having some shortness of breath.  She denies have any chest pain tightness squeezing pressure burning chest no proximal nocturia no fever no chills no swelling of lower extremities she also desaturates today down to 89.  Past Medical History:  Diagnosis Date   Arthritis    Breast cancer (HCC) dx'd 09/2015   COPD (chronic obstructive pulmonary disease) (HCC)    Coronary artery disease    Depression    Diabetes mellitus (HCC) 10/22/2015   Full dentures    full upper dentures, plate on bottom   History of bronchitis    History of pneumonia    Hyperlipidemia    Hypertension    Hypothyroidism 10/22/2015   lung ca dx;d 09/2015   lung cancer   Myocardial infarction [facility]) July 1995   Osteoporosis 10/22/2015   Personal history of radiation therapy    Stress incontinence    Uterine cancer (HCC) dx'd 1968    Past Surgical History:  Procedure Laterality Date   ABDOMINAL HYSTERECTOMY     1968   APPENDECTOMY     with hysterectomy   BLADDER SURGERY     1972   BREAST BIOPSY Left 11/04/2015   Malignant   BREAST BIOPSY Left 11/04/2015   Malignant   BREAST LUMPECTOMY Left 11/19/2015   CARDIAC CATHETERIZATION  1995, 1996   Dr. 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   LUNG BIOPSY N/A 11/13/2015   Procedure: RIGHT LUNG BIOPSY;  Surgeon: Delight Ovens, MD;  Location: [facility] OR;  Service: Thoracic;  Laterality: N/A;   MULTIPLE TOOTH EXTRACTIONS     RADIOACTIVE SEED GUIDED PARTIAL MASTECTOMY WITH AXILLARY SENTINEL LYMPH NODE BIOPSY Left 11/19/2015   Procedure: RADIOACTIVE SEED GUIDED PARTIAL MASTECTOMY WITH AXILLARY SENTINEL LYMPH NODE BIOPSY;  Surgeon: Manus Rudd, MD;  Location: [facility];  Service: General;  Laterality: Left;   RE-EXCISION OF BREAST LUMPECTOMY Left 12/03/2015   Procedure: RE-EXCISION INFERIOR MARGIN OF LEFT BREAST LUMPECTOMY;  Surgeon: Manus Rudd, MD;  Location: [facility] OR;  Service: General;  Laterality: Left;   TMJ ARTHROPLASTY     1979   TONSILLECTOMY     VIDEO BRONCHOSCOPY WITH ENDOBRONCHIAL NAVIGATION N/A 11/13/2015   Procedure: VIDEO BRONCHOSCOPY WITH ENDOBRONCHIAL NAVIGATION, with placement of fudicial markers;  Surgeon: Delight Ovens, MD;  Location: [facility] OR;  Service: Thoracic;  Laterality: N/A;    Current Medications: Current Meds  Medication Sig   acetaminophen (TYLENOL) 500 MG tablet Take 1,000 mg by mouth 2 (two) times daily as needed (pain). Reported on 01/20/2016   albuterol (VENTOLIN HFA) 108 (90 Base) MCG/ACT inhaler  Inhale 2 puffs into the lungs 4 (four) times daily as needed for shortness of breath.   amitriptyline (ELAVIL) 50 MG tablet Take 50 mg by mouth at bedtime.     aspirin EC 81 MG tablet Take 1 tablet (81 mg total) by mouth daily. Swallow whole.   atorvastatin (LIPITOR) 40 MG tablet Take 40 mg by mouth at bedtime.    Calcium Citrate-Vitamin D (CALCIUM + D PO) Take 2 tablets by mouth 3 (three) times daily. Each tablet Calcium 300 mg, Vitamin D3 250 I.U.   FARXIGA 10 MG TABS tablet TAKE 1 TABLET BY MOUTH DAILY BEFORE BREAKFAST.   furosemide (LASIX) 40 MG tablet Take 1 tablet (40 mg total) by mouth 2 (two) times daily.   ketoconazole (NIZORAL) 2 %  cream Apply 1 application topically daily.   Krill Oil 1000 MG CAPS Take 1,000 mg by mouth daily.   levothyroxine (SYNTHROID, LEVOTHROID) 88 MCG tablet Take 88 mcg by mouth daily before breakfast.    metFORMIN (GLUCOPHAGE) 500 MG tablet Take 500 mg by mouth daily.   metoprolol succinate (TOPROL-XL) 50 MG 24 hr tablet Take 50 mg by mouth daily.   nitroGLYCERIN (NITROSTAT) 0.4 MG SL tablet Place 1 tablet (0.4 mg total) under the tongue every 5 (five) minutes as needed for chest pain. Reported on 01/28/2016   potassium chloride SA (KLOR-CON M) 20 MEQ tablet Take 1 tablet (20 mEq total) by mouth 2 (two) times daily.   [DISCONTINUED] alendronate (FOSAMAX) 70 MG tablet TAKE 1 TABLET BY MOUTH ONCE A WEEK. TAKE WITH FULL GLASS OF WATER ON EMPTY STOMACH. (Patient taking differently: Take 70 mg by mouth once a week.)     Allergies:   Patient has no known allergies.   Social History   Socioeconomic History   Marital status: Married    Spouse name: Not on file   Number of children: Not on file   Years of education: Not on file   Highest education level: Not on file  Occupational History   Not on file  Tobacco Use   Smoking status: Former    Types: Cigarettes    Quit date: 08/17/1994    Years since quitting: 28.4    Passive exposure: Past   Smokeless tobacco: Never  Vaping Use   Vaping Use: Never used  Substance and Sexual Activity   Alcohol use: No   Drug use: No   Sexual activity: Not on file  Other Topics Concern   Not on file  Social History Narrative   Not on file   Social Determinants of Health   Financial Resource Strain: Not on file  Food Insecurity: Not on file  Transportation Needs: Not on file  Physical Activity: Not on file  Stress: Not on file  Social Connections: Not on file     Family History: The patient's family history includes Alzheimer's disease in her mother; Heart attack in her father. There is no history of Breast cancer. ROS:   Please see the history of  present illness.    All 14 point review of systems negative except as described per history of present illness  EKGs/Labs/Other Studies Reviewed:      Recent Labs: No results found for requested labs within last 365 days.  Recent Lipid Panel    Component Value Date/Time   CHOL 134 12/05/2021 1014   TRIG 104 12/05/2021 1014   HDL 43 12/05/2021 1014   CHOLHDL 3.1 12/05/2021 1014   LDLCALC 72 12/05/2021 1014  Physical Exam:    VS:  BP (!) 140/72 (BP Location: Left Arm, Patient Position: Sitting)   Pulse 74   Ht 5\' 2"  (1.575 m)   Wt 129 lb (58.5 kg)   SpO2 (!) 89%   BMI 23.59 kg/m     Wt Readings from Last 3 Encounters:  01/27/23 129 lb (58.5 kg)  07/22/22 134 lb (60.8 kg)  12/04/21 139 lb (63 kg)     GEN:  Well nourished, well developed in no acute distress HEENT: Normal NECK: No JVD; No carotid bruits LYMPHATICS: No lymphadenopathy CARDIAC: RRR, no murmurs, no rubs, no gallops RESPIRATORY:  Clear to auscultation without rales, wheezing or rhonchi overinflated ABDOMEN: Soft, non-tender, non-distended MUSCULOSKELETAL:  No edema; No deformity  SKIN: Warm and dry LOWER EXTREMITIES: no swelling NEUROLOGIC:  Alert and oriented x 3 PSYCHIATRIC:  Normal affect   ASSESSMENT:    1. Coronary artery disease of native artery of native heart with stable angina pectoris (HCC)   2. Diabetes mellitus without complication (HCC)   3. Chronic diastolic (congestive) heart failure (HCC)   4. Simple chronic bronchitis (HCC)    PLAN:    In order of problems listed above:  Dyspnea on exertion, multifactorial I suspect COPD play more significant role here I will ask her to get proBNP and Chem-7 I did review echocardiogram showed preserved ejection fraction without significant valvular pathology, we will schedule follow-up chest x-ray.  If cardiac workup negative she need to follow-up with pulmonary primary care physicians. Coronary artery disease stable denies have any chest pain  tightness squeezing pressure burning chest. Dyslipidemia did review K PN which show me LDL 72 HDL 43 continue present management. Diabetes follow-up by internal medicine team   Medication Adjustments/Labs and Tests Ordered: Current medicines are reviewed at length with the patient today.  Concerns regarding medicines are outlined above.  No orders of the defined types were placed in this encounter.  Medication changes: No orders of the defined types were placed in this encounter.   Signed, Georgeanna Lea, MD, [facility] 01/27/2023 3:14 PM    [facility]

## 2023-01-27 NOTE — Addendum Note (Signed)
Addended by: Roxanne Mins I on: 01/27/2023 03:34 PM   Modules accepted: Orders

## 2023-01-27 NOTE — Patient Instructions (Signed)
Medication Instructions:  Your physician recommends that you continue on your current medications as directed. Please refer to the Current Medication list given to you today.  *If you need a refill on your cardiac medications before your next appointment, please call your pharmacy*   Lab Work: Your physician recommends that you return for lab work in:   Labs today: Pro BNP, BMP  If you have labs (blood work) drawn today and your tests are completely normal, you will receive your results only by: MyChart Message (if you have MyChart) OR A paper copy in the mail If you have any lab test that is abnormal or we need to change your treatment, we will call you to review the results.   Testing/Procedures: chest X-ray    Follow-Up: At [facility], you and your health needs are our priority.  As part of our continuing mission to provide you with exceptional heart care, we have created designated Provider Care Teams.  These Care Teams include your primary Cardiologist (physician) and Advanced Practice Providers (APPs -  Physician Assistants and Nurse Practitioners) who all work together to provide you with the care you need, when you need it.  We recommend signing up for the patient portal called "MyChart".  Sign up information is provided on this After Visit Summary.  MyChart is used to connect with patients for Virtual Visits (Telemedicine).  Patients are able to view lab/test results, encounter notes, upcoming appointments, etc.  Non-urgent messages can be sent to your provider as well.   To learn more about what you can do with MyChart, go to [email or web address].    Your next appointment:    6 month(s)  Provider:   Gypsy Balsam, MD    Other Instructions None

## 2023-01-28 LAB — BASIC METABOLIC PANEL
BUN/Creatinine Ratio: 18 (ref 12–28)
BUN: 16 mg/dL (ref 8–27)
CO2: 27 mmol/L (ref 20–29)
Calcium: 8.9 mg/dL (ref 8.7–10.3)
Chloride: 93 mmol/L — ABNORMAL LOW (ref 96–106)
Creatinine, Ser: 0.9 mg/dL (ref 0.57–1.00)
Glucose: 84 mg/dL (ref 70–99)
Potassium: 4.9 mmol/L (ref 3.5–5.2)
Sodium: 139 mmol/L (ref 134–144)
eGFR: 65 mL/min/{1.73_m2} (ref >59)

## 2023-01-28 LAB — PRO B NATRIURETIC PEPTIDE: NT-Pro BNP: 1491 pg/mL — ABNORMAL HIGH (ref 0–738)

## 2023-02-03 ENCOUNTER — Other Ambulatory Visit: Payer: Self-pay

## 2023-02-03 ENCOUNTER — Telehealth: Payer: Self-pay | Admitting: Cardiology

## 2023-02-03 DIAGNOSIS — I5032 Chronic diastolic (congestive) heart failure: Secondary | ICD-10-CM

## 2023-02-03 MED ORDER — FUROSEMIDE 40 MG PO TABS: 60.0000 mg | ORAL_TABLET | Freq: Two times a day (BID) | ORAL | 3 refills | Status: DC

## 2023-02-03 NOTE — Telephone Encounter (Signed)
Patient informed of results. Lab orders entered via Epic. Lasix medication ordered via Epic and sent to the patient's pharmacy.

## 2023-02-03 NOTE — Telephone Encounter (Signed)
Follow Up: ? ? ? ?Patient is returning call, concerning her lab results. ?

## 2023-02-08 ENCOUNTER — Telehealth: Payer: Self-pay

## 2023-02-08 NOTE — Telephone Encounter (Signed)
Left message on My Chart with normal results per Dr. Krasowski's note. Routed to PCP. 

## 2023-02-12 LAB — PRO B NATRIURETIC PEPTIDE: NT-Pro BNP: 4182 pg/mL — ABNORMAL HIGH (ref 0–738)

## 2023-02-12 LAB — BASIC METABOLIC PANEL
BUN/Creatinine Ratio: 17 (ref 12–28)
BUN: 17 mg/dL (ref 8–27)
CO2: 30 mmol/L — ABNORMAL HIGH (ref 20–29)
Calcium: 10.7 mg/dL — ABNORMAL HIGH (ref 8.7–10.3)
Chloride: 92 mmol/L — ABNORMAL LOW (ref 96–106)
Creatinine, Ser: 0.99 mg/dL (ref 0.57–1.00)
Glucose: 81 mg/dL (ref 70–99)
Potassium: 4.8 mmol/L (ref 3.5–5.2)
Sodium: 139 mmol/L (ref 134–144)
eGFR: 58 mL/min/{1.73_m2} — ABNORMAL LOW (ref >59)

## 2023-02-19 ENCOUNTER — Telehealth: Payer: Self-pay

## 2023-02-19 DIAGNOSIS — I5032 Chronic diastolic (congestive) heart failure: Secondary | ICD-10-CM

## 2023-02-19 NOTE — Telephone Encounter (Signed)
-----   Message from Georgeanna Lea, MD sent at 02/19/2023 10:01 AM EDT ----- Final I will get proBNP back which is very abnormal, this is 8 days ago, please recheck her proBNP

## 2023-02-25 LAB — PRO B NATRIURETIC PEPTIDE: NT-Pro BNP: 5414 pg/mL — ABNORMAL HIGH (ref 0–738)

## 2023-03-29 ENCOUNTER — Other Ambulatory Visit: Payer: Self-pay | Admitting: Internal Medicine

## 2023-03-29 DIAGNOSIS — Z0000 Encounter for general adult medical examination without abnormal findings: Secondary | ICD-10-CM

## 2023-04-15 ENCOUNTER — Ambulatory Visit
Admission: RE | Admit: 2023-04-15 | Discharge: 2023-04-15 | Disposition: A | Discharge status: Home / Self Care | Payer: Medicare Other | Source: Ambulatory Visit | Attending: Internal Medicine | Admitting: Internal Medicine

## 2023-04-15 DIAGNOSIS — Z0000 Encounter for general adult medical examination without abnormal findings: Secondary | ICD-10-CM

## 2023-06-11 ENCOUNTER — Other Ambulatory Visit: Payer: Self-pay | Admitting: Cardiology

## 2023-07-02 ENCOUNTER — Ambulatory Visit: Payer: Medicare Other | Attending: Physician Assistant | Admitting: Physician Assistant

## 2023-07-02 ENCOUNTER — Encounter: Payer: Self-pay | Admitting: Physician Assistant

## 2023-07-02 VITALS — BP 126/84 | HR 78 | Ht 62.0 in | Wt 144.2 lb

## 2023-07-02 DIAGNOSIS — I2510 Atherosclerotic heart disease of native coronary artery without angina pectoris: Secondary | ICD-10-CM | POA: Diagnosis not present

## 2023-07-02 DIAGNOSIS — E119 Type 2 diabetes mellitus without complications: Secondary | ICD-10-CM

## 2023-07-02 DIAGNOSIS — I5032 Chronic diastolic (congestive) heart failure: Secondary | ICD-10-CM

## 2023-07-02 DIAGNOSIS — R0602 Shortness of breath: Secondary | ICD-10-CM | POA: Diagnosis not present

## 2023-07-02 MED ORDER — TORSEMIDE 20 MG PO TABS: 40.0000 mg | ORAL_TABLET | Freq: Two times a day (BID) | ORAL | 5 refills | Status: DC

## 2023-07-02 NOTE — Patient Instructions (Signed)
Denise Mckenzie,  Weigh yourself each morning after using the bathroom, before breakfast Follow a low sodium diet Send a message or call early next week to let Denise Mckenzie know how you are:  weights, swelling and breathing  Medication Instructions:  Your physician has recommended you make the following change in your medication:  1.) stop Lasix 2.) start torsemide (Demadex) 20 mg tablets --take 40 mg (2 tabs) twice a day  *If you need a refill on your cardiac medications before your next appointment, please call your pharmacy*   Lab Work: Today: bmet, pro bnp One week after starting torsemide-come back for labs (bmet, pro bnp)  If you have labs (blood work) drawn today and your tests are completely normal, you will receive your results only by: MyChart Message (if you have MyChart) OR A paper copy in the mail If you have any lab test that is abnormal or we need to change your treatment, we will call you to review the results.   Testing/Procedures: none   Follow-Up: At [facility], you and your health needs are our priority.  As part of our continuing mission to provide you with exceptional heart care, we have created designated Provider Care Teams.  These Care Teams include your primary Cardiologist (physician) and Advanced Practice Providers (APPs -  Physician Assistants and Nurse Practitioners) who all work together to provide you with the care you need, when you need it.  We recommend signing up for the patient portal called "MyChart".  Sign up information is provided on this After Visit Summary.  MyChart is used to connect with patients for Virtual Visits (Telemedicine).  Patients are able to view lab/test results, encounter notes, upcoming appointments, etc.  Non-urgent messages can be sent to your provider as well.   To learn more about what you can do with MyChart, go to [email or web address].    Your next appointment:   3 week(s)  Provider:   Jari Favre, PA-C, Ronie Spies,  PA-C, Robin Searing, NP, Jacolyn Reedy, PA-C, Eligha Bridegroom, NP, Tereso Newcomer, PA-C, or Perlie Gold, PA-C

## 2023-07-02 NOTE — Addendum Note (Signed)
Addended by: Lendon Ka on: 07/02/2023 04:47 PM   Modules accepted: Orders

## 2023-07-02 NOTE — Progress Notes (Signed)
Cardiology Office Note:  .   Date:  07/02/2023  ID:  Denise Mckenzie, DOB 06/02/1946, MRN [id number] PCP: Burton Apley, MD  [facility] Providers Cardiologist:  None {  History of Present Illness: .   Denise Mckenzie is a 77 y.o. female with a past medical history significant for coronary artery disease, history of coronary bypass grafting in 1996, essential hypertension, hyperlipidemia, diabetes, history of breast and lung cancer here for follow-up appointment.  Was seen by Dr. Bing Matter back in June of this year.  At that time she was doing well other than some shortness of breath.  Denied having any chest pain/pressure or burning to the chest.  No proximal nocturia, no fever or chills, no swelling or lower extremity edema, she was desaturating down to about 89%.  Today, she presents with a history of heart failure and diabetes with significant edema and a rash that started about two weeks ago. The edema has not improved despite an increase in the furosemide dosage from 40mg  to 80mg  twice a day. The patient has been on furosemide for about two years since her heart failure diagnosis. The patient also reports a weight gain of about 15 pounds since her last visit. The patient's diabetes is well-controlled with a recent A1c of 6.5. She follows a low-sodium diet and cooks her own meals using fresh ingredients.    Reports no chest pain, pressure, or tightness. No orthopnea, PND. Reports no palpitations.    Discussed the use of AI scribe software for clinical note transcription with the patient, who gave verbal consent to proceed.    ROS: Pertinent ROS in HPI  Studies Reviewed: .       Echocardiogram 08/24/2022 IMPRESSIONS     1. GLS -15.3. Left ventricular ejection fraction, by estimation, is 60 to  65%. Left ventricular ejection fraction by 3D volume is 65 %. The left  ventricle has normal function. Left ventricular endocardial border not  optimally defined to evaluate  regional  wall motion. Left ventricular diastolic parameters are  indeterminate.   2. Right ventricular systolic function is normal. The right ventricular  size is mildly enlarged.   3. Left atrial size was mildly dilated.   4. The mitral valve is normal in structure. Trivial mitral valve  regurgitation. No evidence of mitral stenosis.   5. The aortic valve is normal in structure. Aortic valve regurgitation is  not visualized. No aortic stenosis is present.   6. The inferior vena cava is normal in size with greater than 50%  respiratory variability, suggesting right atrial pressure of 3 mmHg.   Comparison(s): EF 55%.   FINDINGS   Left Ventricle: GLS -15.3. Left ventricular ejection fraction, by  estimation, is 60 to 65%. Left ventricular ejection fraction by 3D volume  is 65 %. The left ventricle has normal function. Left ventricular  endocardial border not optimally defined to  evaluate regional wall motion. The left ventricular internal cavity size  was normal in size. There is no left ventricular hypertrophy. Left  ventricular diastolic parameters are indeterminate.   Right Ventricle: The right ventricular size is mildly enlarged. No  increase in right ventricular wall thickness. Right ventricular systolic  function is normal.   Left Atrium: Left atrial size was mildly dilated.   Right Atrium: Right atrial size was normal in size.   Pericardium: There is no evidence of pericardial effusion.   Mitral Valve: The mitral valve is normal in structure. Trivial mitral  valve regurgitation. No evidence  of mitral valve stenosis.   Tricuspid Valve: The tricuspid valve is normal in structure. Tricuspid  valve regurgitation is not demonstrated. No evidence of tricuspid  stenosis.   Aortic Valve: The aortic valve is normal in structure. Aortic valve  regurgitation is not visualized. No aortic stenosis is present. Aortic  valve mean gradient measures 2.0 mmHg. Aortic valve peak gradient measures   4.0 mmHg. Aortic valve area, by VTI  measures 2.47 cm.   Pulmonic Valve: The pulmonic valve was normal in structure. Pulmonic valve  regurgitation is not visualized. No evidence of pulmonic stenosis.   Aorta: The aortic root is normal in size and structure.   Venous: The inferior vena cava is normal in size with greater than 50%  respiratory variability, suggesting right atrial pressure of 3 mmHg.   IAS/Shunts: No atrial level shunt detected by color flow Doppler.          Physical Exam:   VS:  BP 126/84   Pulse 78   Ht 5\' 2"  (1.575 m)   Wt 144 lb 3.2 oz (65.4 kg)   SpO2 (!) 87%   BMI 26.37 kg/m    Wt Readings from Last 3 Encounters:  07/02/23 144 lb 3.2 oz (65.4 kg)  01/27/23 129 lb (58.5 kg)  07/22/22 134 lb (60.8 kg)    GEN: Well nourished, well developed in no acute distress NECK: No JVD; No carotid bruits CARDIAC: RRR, no murmurs, rubs, gallops RESPIRATORY:  Clear to auscultation without rales, wheezing or rhonchi  ABDOMEN: Soft, non-tender, non-distended EXTREMITIES: 2-3+ pitting LE edema; No deformity   ASSESSMENT AND PLAN: .   CAD -No chest pain but does have shortness of breath which is chronic and exacerbated by her fluid overload today -Continue current medication regimen including aspirin 81 mg daily, Lipitor 40 mg at bedtime, Farxiga 10 mg daily, torsemide 40 mg twice daily, metoprolol succinate 50 mg daily, nitro as needed, potassium 20 mEq twice daily     Chronic Diastolic Congestive Heart Failure Significant peripheral edema with weight gain of 15 lbs and pitting edema up to the knees. Furosemide appears to have lost its efficacy. -Order BNP and BMP -Replace Furosemide with Torsemide 40mg  twice daily. -Repeat BNP and kidney function tests in 1 week. -Consider adding Metolazone if no improvement in fluid status by early next week. -Continue daily weights and low sodium diet (less than 2 grams daily). -Return for follow-up in 3-4  weeks.  Dermatologic Rash on arm associated with swelling. -Apply over-the-counter cortisone cream for itching.  Diabetes Well controlled with recent A1C of 6.5. -Continue current management.   Dispo: couple week with me or APP  Signed, Sharlene Dory, PA-C

## 2023-07-03 LAB — PRO B NATRIURETIC PEPTIDE: NT-Pro BNP: 21913 pg/mL — ABNORMAL HIGH (ref 0–738)

## 2023-07-03 LAB — BASIC METABOLIC PANEL
BUN/Creatinine Ratio: 22 (ref 12–28)
BUN: 26 mg/dL (ref 8–27)
CO2: 25 mmol/L (ref 20–29)
Calcium: 8.9 mg/dL (ref 8.7–10.3)
Chloride: 95 mmol/L — ABNORMAL LOW (ref 96–106)
Creatinine, Ser: 1.2 mg/dL — ABNORMAL HIGH (ref 0.57–1.00)
Glucose: 87 mg/dL (ref 70–99)
Potassium: 4.3 mmol/L (ref 3.5–5.2)
Sodium: 137 mmol/L (ref 134–144)
eGFR: 46 mL/min/{1.73_m2} — ABNORMAL LOW (ref >59)

## 2023-07-05 ENCOUNTER — Other Ambulatory Visit: Payer: Self-pay

## 2023-07-05 ENCOUNTER — Telehealth: Payer: Self-pay | Admitting: Cardiology

## 2023-07-05 NOTE — Telephone Encounter (Signed)
Patient states she was returning call. Please advise  

## 2023-07-05 NOTE — Telephone Encounter (Signed)
Called patient and she reported that she has lower extremity edema up to her thighs. She was seen in the [facility] office on Friday and her Lasix was switched to Torsemide. She started taking the Torsemide on Friday night and has been going to the bathroom " a lot". Last Friday her Pro BNP was 21,000 and she stated that her renal function was not the best. Patient is asking if the Torsemide takes a few days to start working?  Her swelling is no better than it was on Friday. Please advise.

## 2023-07-07 NOTE — Telephone Encounter (Signed)
Called patient and informed her of Dr. Madireddy's recommendation below:  "Should cut down on her salt intake. Documented Daily weights Continue with torsemide, her mentioning that she is urinating 'a lot' is a good sign. It does take some days to get all the fluid she gained [up to 15 pound weight gain as noted at the office visit] to come down down.  Continue with torsemide 40 mg twice daily. Recheck blood work for proBNP which is already been ordered by [employer]."  Patient verbalized understanding and stated she would continue to take her Torsemide and have her labs drawn on Friday. Patient had no further questions at this time.

## 2023-07-10 LAB — BASIC METABOLIC PANEL
BUN/Creatinine Ratio: 29 — ABNORMAL HIGH (ref 12–28)
BUN: 50 mg/dL — ABNORMAL HIGH (ref 8–27)
CO2: 23 mmol/L (ref 20–29)
Calcium: 9.3 mg/dL (ref 8.7–10.3)
Chloride: 94 mmol/L — ABNORMAL LOW (ref 96–106)
Creatinine, Ser: 1.74 mg/dL — ABNORMAL HIGH (ref 0.57–1.00)
Sodium: 135 mmol/L (ref 134–144)
eGFR: 29 mL/min/{1.73_m2} — ABNORMAL LOW (ref >59)

## 2023-07-10 LAB — PRO B NATRIURETIC PEPTIDE: NT-Pro BNP: 33547 pg/mL — ABNORMAL HIGH (ref 0–738)

## 2023-07-12 ENCOUNTER — Emergency Department (HOSPITAL_BASED_OUTPATIENT_CLINIC_OR_DEPARTMENT_OTHER): Payer: Medicare Other

## 2023-07-12 ENCOUNTER — Telehealth: Payer: Self-pay | Admitting: Cardiology

## 2023-07-12 ENCOUNTER — Encounter (HOSPITAL_BASED_OUTPATIENT_CLINIC_OR_DEPARTMENT_OTHER): Payer: Self-pay | Admitting: Emergency Medicine

## 2023-07-12 ENCOUNTER — Other Ambulatory Visit: Payer: Self-pay

## 2023-07-12 ENCOUNTER — Inpatient Hospital Stay (HOSPITAL_BASED_OUTPATIENT_CLINIC_OR_DEPARTMENT_OTHER)
Admission: EM | Admit: 2023-07-12 | Discharge: 2023-07-23 | DRG: 291 | Disposition: A | Discharge status: Skilled Nursing Facility | Payer: Medicare Other | Attending: Internal Medicine | Admitting: Internal Medicine

## 2023-07-12 DIAGNOSIS — R55 Syncope and collapse: Secondary | ICD-10-CM | POA: Diagnosis present

## 2023-07-12 DIAGNOSIS — E785 Hyperlipidemia, unspecified: Secondary | ICD-10-CM | POA: Diagnosis present

## 2023-07-12 DIAGNOSIS — J189 Pneumonia, unspecified organism: Secondary | ICD-10-CM | POA: Diagnosis present

## 2023-07-12 DIAGNOSIS — N170 Acute kidney failure with tubular necrosis: Secondary | ICD-10-CM | POA: Diagnosis present

## 2023-07-12 DIAGNOSIS — L03115 Cellulitis of right lower limb: Secondary | ICD-10-CM | POA: Diagnosis present

## 2023-07-12 DIAGNOSIS — Z90710 Acquired absence of both cervix and uterus: Secondary | ICD-10-CM

## 2023-07-12 DIAGNOSIS — Z951 Presence of aortocoronary bypass graft: Secondary | ICD-10-CM | POA: Diagnosis not present

## 2023-07-12 DIAGNOSIS — R601 Generalized edema: Secondary | ICD-10-CM | POA: Diagnosis not present

## 2023-07-12 DIAGNOSIS — E039 Hypothyroidism, unspecified: Secondary | ICD-10-CM | POA: Diagnosis present

## 2023-07-12 DIAGNOSIS — Z79890 Hormone replacement therapy (postmenopausal): Secondary | ICD-10-CM

## 2023-07-12 DIAGNOSIS — E669 Obesity, unspecified: Secondary | ICD-10-CM | POA: Diagnosis present

## 2023-07-12 DIAGNOSIS — N179 Acute kidney failure, unspecified: Secondary | ICD-10-CM

## 2023-07-12 DIAGNOSIS — I700 Atherosclerosis of aorta: Secondary | ICD-10-CM | POA: Diagnosis present

## 2023-07-12 DIAGNOSIS — Z923 Personal history of irradiation: Secondary | ICD-10-CM

## 2023-07-12 DIAGNOSIS — I509 Heart failure, unspecified: Secondary | ICD-10-CM | POA: Diagnosis not present

## 2023-07-12 DIAGNOSIS — D3500 Benign neoplasm of unspecified adrenal gland: Secondary | ICD-10-CM | POA: Diagnosis present

## 2023-07-12 DIAGNOSIS — E875 Hyperkalemia: Secondary | ICD-10-CM | POA: Diagnosis present

## 2023-07-12 DIAGNOSIS — I5031 Acute diastolic (congestive) heart failure: Secondary | ICD-10-CM | POA: Diagnosis not present

## 2023-07-12 DIAGNOSIS — Z7982 Long term (current) use of aspirin: Secondary | ICD-10-CM

## 2023-07-12 DIAGNOSIS — Z7984 Long term (current) use of oral hypoglycemic drugs: Secondary | ICD-10-CM

## 2023-07-12 DIAGNOSIS — I480 Paroxysmal atrial fibrillation: Secondary | ICD-10-CM | POA: Diagnosis present

## 2023-07-12 DIAGNOSIS — I2510 Atherosclerotic heart disease of native coronary artery without angina pectoris: Secondary | ICD-10-CM | POA: Diagnosis not present

## 2023-07-12 DIAGNOSIS — I272 Pulmonary hypertension, unspecified: Secondary | ICD-10-CM | POA: Diagnosis present

## 2023-07-12 DIAGNOSIS — J9601 Acute respiratory failure with hypoxia: Secondary | ICD-10-CM | POA: Diagnosis present

## 2023-07-12 DIAGNOSIS — I2489 Other forms of acute ischemic heart disease: Secondary | ICD-10-CM | POA: Diagnosis present

## 2023-07-12 DIAGNOSIS — I5082 Biventricular heart failure: Secondary | ICD-10-CM | POA: Diagnosis present

## 2023-07-12 DIAGNOSIS — I4891 Unspecified atrial fibrillation: Secondary | ICD-10-CM | POA: Diagnosis not present

## 2023-07-12 DIAGNOSIS — Z8542 Personal history of malignant neoplasm of other parts of uterus: Secondary | ICD-10-CM

## 2023-07-12 DIAGNOSIS — D696 Thrombocytopenia, unspecified: Secondary | ICD-10-CM | POA: Diagnosis not present

## 2023-07-12 DIAGNOSIS — J432 Centrilobular emphysema: Secondary | ICD-10-CM | POA: Diagnosis present

## 2023-07-12 DIAGNOSIS — Y95 Nosocomial condition: Secondary | ICD-10-CM | POA: Diagnosis present

## 2023-07-12 DIAGNOSIS — E876 Hypokalemia: Secondary | ICD-10-CM | POA: Diagnosis present

## 2023-07-12 DIAGNOSIS — E119 Type 2 diabetes mellitus without complications: Secondary | ICD-10-CM | POA: Diagnosis present

## 2023-07-12 DIAGNOSIS — E782 Mixed hyperlipidemia: Secondary | ICD-10-CM | POA: Diagnosis not present

## 2023-07-12 DIAGNOSIS — Z6827 Body mass index (BMI) 27.0-27.9, adult: Secondary | ICD-10-CM

## 2023-07-12 DIAGNOSIS — Z79899 Other long term (current) drug therapy: Secondary | ICD-10-CM

## 2023-07-12 DIAGNOSIS — I110 Hypertensive heart disease with heart failure: Secondary | ICD-10-CM | POA: Diagnosis not present

## 2023-07-12 DIAGNOSIS — I959 Hypotension, unspecified: Secondary | ICD-10-CM | POA: Diagnosis present

## 2023-07-12 DIAGNOSIS — I358 Other nonrheumatic aortic valve disorders: Secondary | ICD-10-CM | POA: Diagnosis present

## 2023-07-12 DIAGNOSIS — Z853 Personal history of malignant neoplasm of breast: Secondary | ICD-10-CM

## 2023-07-12 DIAGNOSIS — J441 Chronic obstructive pulmonary disease with (acute) exacerbation: Secondary | ICD-10-CM | POA: Diagnosis present

## 2023-07-12 DIAGNOSIS — I2583 Coronary atherosclerosis due to lipid rich plaque: Secondary | ICD-10-CM | POA: Diagnosis not present

## 2023-07-12 DIAGNOSIS — I4892 Unspecified atrial flutter: Secondary | ICD-10-CM | POA: Diagnosis present

## 2023-07-12 DIAGNOSIS — Z87891 Personal history of nicotine dependence: Secondary | ICD-10-CM

## 2023-07-12 DIAGNOSIS — K8020 Calculus of gallbladder without cholecystitis without obstruction: Secondary | ICD-10-CM | POA: Diagnosis present

## 2023-07-12 DIAGNOSIS — R21 Rash and other nonspecific skin eruption: Secondary | ICD-10-CM | POA: Diagnosis present

## 2023-07-12 DIAGNOSIS — M7989 Other specified soft tissue disorders: Secondary | ICD-10-CM | POA: Diagnosis not present

## 2023-07-12 DIAGNOSIS — I50810 Right heart failure, unspecified: Secondary | ICD-10-CM

## 2023-07-12 DIAGNOSIS — J440 Chronic obstructive pulmonary disease with acute lower respiratory infection: Secondary | ICD-10-CM | POA: Diagnosis present

## 2023-07-12 DIAGNOSIS — E871 Hypo-osmolality and hyponatremia: Secondary | ICD-10-CM | POA: Diagnosis present

## 2023-07-12 DIAGNOSIS — J9602 Acute respiratory failure with hypercapnia: Secondary | ICD-10-CM | POA: Diagnosis present

## 2023-07-12 DIAGNOSIS — I5033 Acute on chronic diastolic (congestive) heart failure: Secondary | ICD-10-CM | POA: Diagnosis present

## 2023-07-12 DIAGNOSIS — R7989 Other specified abnormal findings of blood chemistry: Secondary | ICD-10-CM

## 2023-07-12 DIAGNOSIS — R188 Other ascites: Secondary | ICD-10-CM | POA: Diagnosis present

## 2023-07-12 DIAGNOSIS — K5900 Constipation, unspecified: Secondary | ICD-10-CM | POA: Diagnosis present

## 2023-07-12 DIAGNOSIS — Z820 Family history of epilepsy and other diseases of the nervous system: Secondary | ICD-10-CM

## 2023-07-12 DIAGNOSIS — I50811 Acute right heart failure: Secondary | ICD-10-CM

## 2023-07-12 DIAGNOSIS — I2609 Other pulmonary embolism with acute cor pulmonale: Secondary | ICD-10-CM | POA: Diagnosis not present

## 2023-07-12 DIAGNOSIS — E7800 Pure hypercholesterolemia, unspecified: Secondary | ICD-10-CM | POA: Diagnosis not present

## 2023-07-12 DIAGNOSIS — Z85118 Personal history of other malignant neoplasm of bronchus and lung: Secondary | ICD-10-CM

## 2023-07-12 DIAGNOSIS — M4802 Spinal stenosis, cervical region: Secondary | ICD-10-CM | POA: Diagnosis present

## 2023-07-12 DIAGNOSIS — L299 Pruritus, unspecified: Secondary | ICD-10-CM | POA: Diagnosis present

## 2023-07-12 DIAGNOSIS — I252 Old myocardial infarction: Secondary | ICD-10-CM

## 2023-07-12 DIAGNOSIS — Z7901 Long term (current) use of anticoagulants: Secondary | ICD-10-CM

## 2023-07-12 DIAGNOSIS — R000 Tachycardia, unspecified: Secondary | ICD-10-CM | POA: Diagnosis present

## 2023-07-12 DIAGNOSIS — I951 Orthostatic hypotension: Secondary | ICD-10-CM | POA: Diagnosis not present

## 2023-07-12 DIAGNOSIS — I5032 Chronic diastolic (congestive) heart failure: Secondary | ICD-10-CM | POA: Diagnosis not present

## 2023-07-12 DIAGNOSIS — Z8249 Family history of ischemic heart disease and other diseases of the circulatory system: Secondary | ICD-10-CM

## 2023-07-12 HISTORY — DX: Heart failure, unspecified: I50.9

## 2023-07-12 LAB — CBC WITH DIFFERENTIAL/PLATELET
Abs Immature Granulocytes: 0.06 10*3/uL (ref 0.00–0.07)
Basophils Absolute: 0 10*3/uL (ref 0.0–0.1)
Basophils Relative: 0 %
Eosinophils Absolute: 0 10*3/uL (ref 0.0–0.5)
Eosinophils Relative: 0 %
HCT: 55 % — ABNORMAL HIGH (ref 36.0–46.0)
Hemoglobin: 17.6 g/dL — ABNORMAL HIGH (ref 12.0–15.0)
Immature Granulocytes: 1 %
Lymphocytes Relative: 7 %
Lymphs Abs: 0.7 10*3/uL (ref 0.7–4.0)
MCH: 29 pg (ref 26.0–34.0)
MCHC: 32 g/dL (ref 30.0–36.0)
MCV: 90.8 fL (ref 80.0–100.0)
Monocytes Absolute: 0.8 10*3/uL (ref 0.1–1.0)
Monocytes Relative: 7 %
Neutro Abs: 8.7 10*3/uL — ABNORMAL HIGH (ref 1.7–7.7)
Neutrophils Relative %: 85 %
Platelets: 124 10*3/uL — ABNORMAL LOW (ref 150–400)
RBC: 6.06 MIL/uL — ABNORMAL HIGH (ref 3.87–5.11)
RDW: 17.8 % — ABNORMAL HIGH (ref 11.5–15.5)
WBC: 10.3 10*3/uL (ref 4.0–10.5)
nRBC: 0 % (ref 0.0–0.2)

## 2023-07-12 LAB — BASIC METABOLIC PANEL
Anion gap: 8 (ref 5–15)
BUN: 59 mg/dL — ABNORMAL HIGH (ref 8–23)
CO2: 28 mmol/L (ref 22–32)
Calcium: 9.2 mg/dL (ref 8.9–10.3)
Chloride: 94 mmol/L — ABNORMAL LOW (ref 98–111)
Creatinine, Ser: 1.52 mg/dL — ABNORMAL HIGH (ref 0.44–1.00)
GFR, Estimated: 35 mL/min — ABNORMAL LOW (ref >60)
Glucose, Bld: 85 mg/dL (ref 70–99)
Potassium: 5.7 mmol/L — ABNORMAL HIGH (ref 3.5–5.1)
Sodium: 130 mmol/L — ABNORMAL LOW (ref 135–145)

## 2023-07-12 LAB — HEPATIC FUNCTION PANEL
ALT: 33 U/L (ref 0–44)
AST: 38 U/L (ref 15–41)
Albumin: 3.9 g/dL (ref 3.5–5.0)
Alkaline Phosphatase: 102 U/L (ref 38–126)
Bilirubin, Direct: 0.3 mg/dL — ABNORMAL HIGH (ref 0.0–0.2)
Indirect Bilirubin: 0.7 mg/dL (ref 0.3–0.9)
Total Bilirubin: 1 mg/dL (ref <1.2)
Total Protein: 6.8 g/dL (ref 6.5–8.1)

## 2023-07-12 LAB — LACTIC ACID, PLASMA: Lactic Acid, Venous: 1.4 mmol/L (ref 0.5–1.9)

## 2023-07-12 LAB — GLUCOSE, CAPILLARY: Glucose-Capillary: 110 mg/dL — ABNORMAL HIGH (ref 70–99)

## 2023-07-12 LAB — TROPONIN I (HIGH SENSITIVITY)
Troponin I (High Sensitivity): 59 ng/L — ABNORMAL HIGH (ref <18)
Troponin I (High Sensitivity): 57 ng/L — ABNORMAL HIGH (ref <18)

## 2023-07-12 LAB — BRAIN NATRIURETIC PEPTIDE: B Natriuretic Peptide: 2311.4 pg/mL — ABNORMAL HIGH (ref 0.0–100.0)

## 2023-07-12 MED ORDER — ALBUTEROL SULFATE (2.5 MG/3ML) 0.083% IN NEBU: 2.5000 mg | INHALATION_SOLUTION | RESPIRATORY_TRACT | Status: DC | PRN

## 2023-07-12 MED ORDER — IPRATROPIUM-ALBUTEROL 0.5-2.5 (3) MG/3ML IN SOLN
3.0000 mL | Freq: Once | RESPIRATORY_TRACT | Status: AC
  Administered 2023-07-12: 3 mL via RESPIRATORY_TRACT
  Filled 2023-07-12: qty 3

## 2023-07-12 MED ORDER — SODIUM CHLORIDE 0.9% FLUSH
3.0000 mL | Freq: Two times a day (BID) | INTRAVENOUS | Status: DC
  Administered 2023-07-12 – 2023-07-23 (×21): 3 mL via INTRAVENOUS

## 2023-07-12 MED ORDER — AMITRIPTYLINE HCL 50 MG PO TABS
50.0000 mg | ORAL_TABLET | Freq: Every day | ORAL | Status: DC
  Administered 2023-07-12 – 2023-07-22 (×11): 50 mg via ORAL
  Filled 2023-07-12 (×11): qty 1

## 2023-07-12 MED ORDER — ASPIRIN 81 MG PO TBEC
81.0000 mg | DELAYED_RELEASE_TABLET | Freq: Every day | ORAL | Status: DC
  Administered 2023-07-13 – 2023-07-23 (×11): 81 mg via ORAL
  Filled 2023-07-12 (×11): qty 1

## 2023-07-12 MED ORDER — INSULIN ASPART 100 UNIT/ML IJ SOLN
0.0000 [IU] | Freq: Three times a day (TID) | INTRAMUSCULAR | Status: DC
  Administered 2023-07-14 – 2023-07-23 (×12): 1 [IU] via SUBCUTANEOUS

## 2023-07-12 MED ORDER — ACETAMINOPHEN 500 MG PO TABS
1000.0000 mg | ORAL_TABLET | Freq: Four times a day (QID) | ORAL | Status: DC | PRN
  Administered 2023-07-19: 1000 mg via ORAL
  Filled 2023-07-12: qty 2

## 2023-07-12 MED ORDER — FUROSEMIDE 10 MG/ML IJ SOLN
40.0000 mg | Freq: Once | INTRAMUSCULAR | Status: AC
  Administered 2023-07-12: 40 mg via INTRAVENOUS
  Filled 2023-07-12: qty 4

## 2023-07-12 MED ORDER — ALBUTEROL SULFATE (2.5 MG/3ML) 0.083% IN NEBU
2.5000 mg | INHALATION_SOLUTION | Freq: Once | RESPIRATORY_TRACT | Status: AC
  Administered 2023-07-12: 2.5 mg via RESPIRATORY_TRACT
  Filled 2023-07-12: qty 3

## 2023-07-12 MED ORDER — HYDROXYZINE HCL 25 MG PO TABS
25.0000 mg | ORAL_TABLET | Freq: Three times a day (TID) | ORAL | Status: DC | PRN
  Administered 2023-07-18 – 2023-07-20 (×3): 25 mg via ORAL
  Filled 2023-07-12 (×3): qty 1

## 2023-07-12 MED ORDER — MIDODRINE HCL 5 MG PO TABS
5.0000 mg | ORAL_TABLET | Freq: Three times a day (TID) | ORAL | Status: DC
  Administered 2023-07-13: 5 mg via ORAL
  Filled 2023-07-12: qty 1

## 2023-07-12 MED ORDER — LEVOTHYROXINE SODIUM 88 MCG PO TABS
88.0000 ug | ORAL_TABLET | Freq: Every day | ORAL | Status: DC
  Administered 2023-07-13 – 2023-07-23 (×11): 88 ug via ORAL
  Filled 2023-07-12 (×12): qty 1

## 2023-07-12 MED ORDER — IPRATROPIUM-ALBUTEROL 0.5-2.5 (3) MG/3ML IN SOLN
3.0000 mL | Freq: Four times a day (QID) | RESPIRATORY_TRACT | Status: DC
  Administered 2023-07-13 – 2023-07-14 (×5): 3 mL via RESPIRATORY_TRACT
  Filled 2023-07-12 (×7): qty 3

## 2023-07-12 MED ORDER — ATORVASTATIN CALCIUM 40 MG PO TABS
40.0000 mg | ORAL_TABLET | Freq: Every day | ORAL | Status: DC
  Administered 2023-07-12 – 2023-07-22 (×11): 40 mg via ORAL
  Filled 2023-07-12: qty 4
  Filled 2023-07-12: qty 1
  Filled 2023-07-12 (×2): qty 4
  Filled 2023-07-12: qty 1
  Filled 2023-07-12 (×2): qty 4
  Filled 2023-07-12 (×4): qty 1

## 2023-07-12 NOTE — Telephone Encounter (Signed)
Pt c/o Syncope: STAT if syncope occurred within 30 minutes and pt complains of lightheadedness High Priority if episode of passing out, completely, today or in last 24 hours   Did you pass out today? No   When is the last time you passed out? Yesterday   Has this occurred multiple times? No  Did you have any symptoms prior to passing out? No   Patient stated her swelling has not gone down since she had last spoken with RN Gerlene Burdock (check encounter 07/05/23). Patient stated that she has gained 5lbs since 11/20. Patient stated she passed out yesterday with no prior symptoms. Please advise.

## 2023-07-12 NOTE — Telephone Encounter (Signed)
Called patient and she reported that she had gained 5 lbs over 5 days. Also her blood pressure was 80/61 and she also passed out yesterday for 2 minutes. Spoke with Dr. Dulce Sellar and he recommended that the patient go to the ER. I relayed this information to the patient and she stated that she would go to the ER.

## 2023-07-12 NOTE — Telephone Encounter (Signed)
Pt is calling back checking status of call.

## 2023-07-12 NOTE — ED Notes (Signed)
Fall risk armband Fall risk sign on door Fall risk socks

## 2023-07-12 NOTE — ED Notes (Signed)
XR bedside.

## 2023-07-12 NOTE — H&P (Incomplete)
History and Physical    Denise Mckenzie:[id number] DOB: [birth year] DOA: 07/12/2023  PCP: Burton Apley, MD   Patient coming from:  Transfer from [facility]   Chief Complaint:  Chief Complaint  Patient presents with  . Loss of Consciousness    HPI:  Denise Mckenzie is a 77 y.o. female with hx of CAD, MI, remote CABG' 96, COPD, hypertension, hyperlipidemia, breast cancer, lung cancer, who was transferred from [facility] ED for history of syncope, volume overload suspected heart failure.  She reports gradual weight gain since August of this year, weight gradually up from 129 pound to 148 pounds.  Along with this has had gradually worsening dyspnea on exertion.  Now she can hardly walk across the room without becoming short of breath.  Denies any exertional chest pain.  She has chronic 1 pillow orthopnea.  She has had progressive lower extremity edema.  She has been seeing cardiology for diuretic management and over the time period of her symptoms diuretics have been increased gradually from Lasix 20 mg twice daily, most recently increased to torsemide 40 mg twice daily.  She reports having low blood pressures at home similar to current systolics in the 90s. On my interview she does not talk much about syncope.  Had syncopal episode yesterday while seated without prodrome, unable to stand after the episode. Otherwise reports cough, minimal white sputum, no sick contacts no other URI symptoms, no fever/chills.  Feels generally weak and trouble lifting her legs due to weight of fluid.   Review of Systems:  ROS complete and negative except as marked above   No Known Allergies  Prior to Admission medications   Medication Sig Start Date End Date Taking? Authorizing Provider  acetaminophen (TYLENOL) 500 MG tablet Take 1,000 mg by mouth 2 (two) times daily as needed (pain). Reported on 01/20/2016    [provider]  albuterol (VENTOLIN HFA) 108 (90 Base) MCG/ACT inhaler Inhale 2 puffs into the lungs 4  (four) times daily as needed for shortness of breath. 05/18/21   [provider]  amitriptyline (ELAVIL) 50 MG tablet Take 50 mg by mouth at bedtime.      [provider]  aspirin EC 81 MG tablet Take 1 tablet (81 mg total) by mouth daily. Swallow whole. 04/16/21   Baldo Daub, MD  atorvastatin (LIPITOR) 40 MG tablet Take 40 mg by mouth at bedtime.     [provider]  Calcium Citrate-Vitamin D (CALCIUM + D PO) Take 2 tablets by mouth 3 (three) times daily. Each tablet Calcium 300 mg, Vitamin D3 250 I.U.    [provider]  FARXIGA 10 MG TABS tablet TAKE 1 TABLET BY MOUTH DAILY BEFORE BREAKFAST. 10/02/22   Georgeanna Lea, MD  ketoconazole (NIZORAL) 2 % cream Apply 1 application topically daily. 06/04/20   Magrinat, Valentino Hue, MD  KLOR-CON M20 20 MEQ tablet TAKE 1 TABLET BY MOUTH TWICE A DAY 06/11/23   Georgeanna Lea, MD  Krill Oil 1000 MG CAPS Take 1,000 mg by mouth daily.    [provider]  levothyroxine (SYNTHROID, LEVOTHROID) 88 MCG tablet Take 88 mcg by mouth daily before breakfast.     [provider]  metFORMIN (GLUCOPHAGE) 500 MG tablet Take 500 mg by mouth daily. 09/17/15   [provider]  metoprolol succinate (TOPROL-XL) 50 MG 24 hr tablet Take 50 mg by mouth daily. 05/16/21   [provider]  nitroGLYCERIN (NITROSTAT) 0.4 MG SL tablet Place 1 tablet (  0.4 mg total) under the tongue every 5 (five) minutes as needed for chest pain. Reported on 01/28/2016 05/29/21   Baldo Daub, MD  torsemide (DEMADEX) 20 MG tablet Take 2 tablets (40 mg total) by mouth 2 (two) times daily. 07/02/23   Sharlene Dory, PA-C    Past Medical History:  Diagnosis Date  . Acute exacerbation of CHF (congestive heart failure) (HCC) 07/12/2023  . Arthritis   . Breast cancer (HCC) dx'd 09/2015  . COPD (chronic obstructive pulmonary disease) (HCC)   . Coronary artery disease   . Depression   . Diabetes mellitus (HCC) 10/22/2015  .  Full dentures    full upper dentures, plate on bottom  . History of bronchitis   . History of pneumonia   . Hyperlipidemia   . Hypertension   . Hypothyroidism 10/22/2015  . lung ca dx;d 09/2015   lung cancer  . Myocardial infarction [facility]) July 1995  . Osteoporosis 10/22/2015  . Personal history of radiation therapy   . Stress incontinence   . Uterine cancer (HCC) dx'd 1968    Past Surgical History:  Procedure Laterality Date  . ABDOMINAL HYSTERECTOMY     1968  . APPENDECTOMY     with hysterectomy  . BLADDER SURGERY     1972  . BREAST BIOPSY Left 11/04/2015   Malignant  . BREAST BIOPSY Left 11/04/2015   Malignant  . BREAST LUMPECTOMY Left 11/19/2015  . CARDIAC CATHETERIZATION  1995, 1996   Dr. Donnie Aho saw here then here at [facility] both times; 'they did a balloon and opened it up' first time, the 2nd was scheduled d/t abnormal stress test  . COLONOSCOPY W/ POLYPECTOMY    . CORONARY ARTERY BYPASS GRAFT     11/1994  . 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 LUNG BIOPSY N/A 11/13/2015   Procedure: RIGHT LUNG BIOPSY;  Surgeon: Delight Ovens, MD;  Location: [facility] OR;  Service: Thoracic;  Laterality: N/A;  . MULTIPLE TOOTH EXTRACTIONS    . RADIOACTIVE SEED GUIDED PARTIAL MASTECTOMY WITH AXILLARY SENTINEL LYMPH NODE BIOPSY Left 11/19/2015   Procedure: RADIOACTIVE SEED GUIDED PARTIAL MASTECTOMY WITH AXILLARY SENTINEL LYMPH NODE BIOPSY;  Surgeon: Manus Rudd, MD;  Location: [facility];  Service: General;  Laterality: Left;  . RE-EXCISION OF BREAST LUMPECTOMY Left 12/03/2015   Procedure: RE-EXCISION INFERIOR MARGIN OF LEFT BREAST LUMPECTOMY;  Surgeon: Manus Rudd, MD;  Location: [facility] OR;  Service: General;  Laterality: Left;  . TMJ ARTHROPLASTY     1979  . TONSILLECTOMY    . VIDEO BRONCHOSCOPY WITH ENDOBRONCHIAL NAVIGATION N/A 11/13/2015   Procedure: VIDEO BRONCHOSCOPY WITH ENDOBRONCHIAL NAVIGATION, with placement of fudicial markers;  Surgeon:  Delight Ovens, MD;  Location: [facility] OR;  Service: Thoracic;  Laterality: N/A;     reports that she quit smoking about 28 years ago. Her smoking use included cigarettes. She has been exposed to tobacco smoke. She has never used smokeless tobacco. She reports that she does not drink alcohol and does not use drugs.  Family History  Problem Relation Age of Onset  . Alzheimer's disease Mother   . Heart attack Father   . Breast cancer Neg Hx      Physical Exam: Vitals:   07/12/23 2022 07/12/23 2130 07/12/23 2200 07/12/23 2230  BP: 101/84 (!) 82/66 (!) 79/53 (!) 74/60  Pulse: (!) 121 (!) 120 (!) 118 (!) 111  Resp:  Temp: 97.9 F (36.6 C)     TempSrc: Oral     SpO2: 95% 98% 96% 98%  Weight:        Gen: Awake, alert, NAD   CV: Regular, tachycardic, normal S1, S2, no murmurs  Resp: Normal WOB, rales in the bases Abd: Obese, slightly distended, normoactive, nontender MSK: There is 3+ soft pitting edema up towards the knee then continues in a dependent fashion presacrally.  No significant edema in the upper extremities Skin: Excoriations and numerous healing scabs over the upper and lower extremities.  Additionally there is a faint petechial rash distally in the lower extremities which is nonpalpable/nontender.  Over the right knee there is increased warmth and erythema without induration.  Neuro: Alert and interactive  Psych: euthymic, appropriate    Data review:   Labs reviewed, notable for:   Lactate 1.4 BNP 2311 High-sensitivity Trop 59 -> 57 NA 130 K5.7, creatinine 1.5, baseline 0.9 Albumin 3.9 Increased hemoglobin 17 Platelet 124  Micro:  Results for orders placed or performed in visit on 07/18/19  Novel Coronavirus, NAA ([facility])     Status: None   Collection Time: 07/18/19 12:00 AM   Specimen: Nasopharyngeal(NP) swabs in vial transport medium   NASOPHARYNGE  TESTING  Result Value Ref Range Status   SARS-CoV-2, NAA Not Detected Not Detected Final    Comment:  Testing was performed using the cobas(R) SARS-CoV-2 test. This nucleic acid amplification test was developed and its performance characteristics determined by [employer]. Nucleic acid amplification tests include PCR and TMA. This test has not been FDA cleared or approved. This test has been authorized by FDA under an Emergency Use Authorization (EUA). This test is only authorized for the duration of time the declaration that circumstances exist justifying the authorization of the emergency use of in vitro diagnostic tests for detection of SARS-CoV-2 virus and/or diagnosis of COVID-19 infection under section 564(b)(1) of the Act, 21 U.S.C. 045WUJ-8(J) (1), unless the authorization is terminated or revoked sooner. When diagnostic testing is negative, the possibility of a false negative result should be considered in the context of a patient's recent exposures and the presence of clinical signs and symptoms consistent with COVID-19. An individual without symptoms  of COVID-19 and who is not shedding SARS-CoV-2 virus would expect to have a negative (not detected) result in this assay.     Imaging reviewed:  CT CHEST ABDOMEN PELVIS WO CONTRAST  Result Date: 07/12/2023 CLINICAL DATA:  Poly trauma, penetrating. Shortness of breath. Syncope. History of breast cancer. EXAM: CT CHEST, ABDOMEN AND PELVIS WITHOUT CONTRAST TECHNIQUE: Multidetector CT imaging of the chest, abdomen and pelvis was performed following the standard protocol without IV contrast. RADIATION DOSE REDUCTION: This exam was performed according to the departmental dose-optimization program which includes automated exposure control, adjustment of the mA and/or kV according to patient size and/or use of iterative reconstruction technique. COMPARISON:  PET-CT 06/10/2021.  Chest CT 12/24/2020. FINDINGS: CT CHEST FINDINGS Cardiovascular: Diffuse atherosclerosis of the aorta, great vessels and coronary arteries status post median  sternotomy and CABG. Mild central enlargement of the pulmonary arteries. The heart size is normal. There is no pericardial effusion. Mediastinum/Nodes: There are no enlarged mediastinal, hilar, axillary or internal mammary lymph nodes. Surgical clips are present within the left axilla. Hilar assessment is limited by the lack of intravenous contrast, although the hilar contours appear unchanged. The thyroid gland, trachea and esophagus demonstrate no significant findings. Lungs/Pleura: Trace left pleural effusion with dependent left lower lobe atelectasis. No  evidence of pneumothorax. Stable probable radiation changes anteriorly in the right upper lobe adjacent to the fiducial markers. No new or enlarging pulmonary nodules identified. Mild underlying centrilobular emphysema. Musculoskeletal/Chest wall: No chest wall mass or suspicious osseous findings. Stable old fracture of the right 2nd rib anteriorly. Previous median sternotomy. There are degenerative changes in the spine associated with a convex right thoracic scoliosis. Postsurgical changes in the left breast and left axilla. CT ABDOMEN AND PELVIS FINDINGS Hepatobiliary: No focal hepatic abnormalities are identified on noncontrast imaging. There is a small gallstone. The gallbladder appears mildly distended without significant wall thickening. No significant biliary dilatation. Pancreas: Atrophy. No focal abnormality, ductal dilatation or surrounding inflammation. Spleen: No evidence of acute splenic injury. The spleen is normal in size without focal abnormality. Adrenals/Urinary Tract: Stable low-density adrenal adenomas bilaterally; no specific follow-up imaging recommended. No evidence of urinary tract calculus, suspicious renal lesion or hydronephrosis. The bladder appears normal for its degree of distention. Stomach/Bowel: No enteric contrast administered. The stomach appears unremarkable for its degree of distension. No evidence of bowel wall thickening,  distention or surrounding inflammatory change. Vascular/Lymphatic: There are no enlarged abdominal or pelvic lymph nodes. Diffuse aortic and branch vessel atherosclerosis without evidence of aneurysm. Reproductive: Evidence of prior hysterectomy. No suspicious adnexal findings. Other: New moderate amount of low-density ascites without evidence of sentinel clot, dependent high density components or pneumoperitoneum. Intact abdominal wall. There is generalized edema throughout the subcutaneous and intra-abdominal fat. Chronic subcutaneous calcifications in the buttocks bilaterally. Musculoskeletal: No evidence of acute fracture or dislocation. IMPRESSION: 1. No evidence of acute traumatic injury within the chest, abdomen or pelvis. 2. New moderate amount of nonspecific low-density ascites without evidence of sentinel clot, dependent high density components or pneumoperitoneum. Generalized soft tissue edema. 3. Trace left pleural effusion with dependent left lower lobe atelectasis. 4. Stable probable radiation changes anteriorly in the right upper lobe adjacent to the fiducial markers. No new or enlarging pulmonary nodules identified. 5. Cholelithiasis with mild gallbladder distention but no significant wall thickening or biliary dilatation. 6. Stable bilateral adrenal adenomas. 7. Aortic Atherosclerosis (ICD10-I70.0) and Emphysema (ICD10-J43.9). Electronically Signed   By: Carey Bullocks M.D.   On: 07/12/2023 17:07   CT Head Wo Contrast  Result Date: 07/12/2023 CLINICAL DATA:  Polytrauma, blunt. EXAM: CT HEAD WITHOUT CONTRAST CT CERVICAL SPINE WITHOUT CONTRAST TECHNIQUE: Multidetector CT imaging of the head and cervical spine was performed following the standard protocol without intravenous contrast. Multiplanar CT image reconstructions of the cervical spine were also generated. RADIATION DOSE REDUCTION: This exam was performed according to the departmental dose-optimization program which includes automated  exposure control, adjustment of the mA and/or kV according to patient size and/or use of iterative reconstruction technique. COMPARISON:  Head CT 10/22/2015. FINDINGS: CT HEAD FINDINGS Brain: No acute intracranial hemorrhage. Gray-white differentiation is preserved. No hydrocephalus or extra-axial collection. No mass effect or midline shift. Vascular: No hyperdense vessel or unexpected calcification. Skull: No calvarial fracture or suspicious bone lesion. Skull base is unremarkable. Sinuses/Orbits: No acute finding. Other: None. CT CERVICAL SPINE FINDINGS Alignment: No traumatic malalignment. Skull base and vertebrae: No acute fracture. Normal craniocervical junction. No suspicious bone lesions. Soft tissues and spinal canal: No prevertebral fluid or swelling. No visible canal hematoma. Disc levels: Multilevel cervical spondylosis, worst at C3-4, where there is at least mild spinal canal stenosis. Upper chest: No acute findings. Other: Atherosclerotic calcifications of the carotid bulbs. IMPRESSION: 1. No acute intracranial abnormality. 2. No acute cervical spine fracture or traumatic  malalignment. 3. Multilevel cervical spondylosis, worst at C3-4, where there is at least mild spinal canal stenosis. Electronically Signed   By: Orvan Falconer M.D.   On: 07/12/2023 16:29   CT Cervical Spine Wo Contrast  Result Date: 07/12/2023 CLINICAL DATA:  Polytrauma, blunt. EXAM: CT HEAD WITHOUT CONTRAST CT CERVICAL SPINE WITHOUT CONTRAST TECHNIQUE: Multidetector CT imaging of the head and cervical spine was performed following the standard protocol without intravenous contrast. Multiplanar CT image reconstructions of the cervical spine were also generated. RADIATION DOSE REDUCTION: This exam was performed according to the departmental dose-optimization program which includes automated exposure control, adjustment of the mA and/or kV according to patient size and/or use of iterative reconstruction technique. COMPARISON:   Head CT 10/22/2015. FINDINGS: CT HEAD FINDINGS Brain: No acute intracranial hemorrhage. Gray-white differentiation is preserved. No hydrocephalus or extra-axial collection. No mass effect or midline shift. Vascular: No hyperdense vessel or unexpected calcification. Skull: No calvarial fracture or suspicious bone lesion. Skull base is unremarkable. Sinuses/Orbits: No acute finding. Other: None. CT CERVICAL SPINE FINDINGS Alignment: No traumatic malalignment. Skull base and vertebrae: No acute fracture. Normal craniocervical junction. No suspicious bone lesions. Soft tissues and spinal canal: No prevertebral fluid or swelling. No visible canal hematoma. Disc levels: Multilevel cervical spondylosis, worst at C3-4, where there is at least mild spinal canal stenosis. Upper chest: No acute findings. Other: Atherosclerotic calcifications of the carotid bulbs. IMPRESSION: 1. No acute intracranial abnormality. 2. No acute cervical spine fracture or traumatic malalignment. 3. Multilevel cervical spondylosis, worst at C3-4, where there is at least mild spinal canal stenosis. Electronically Signed   By: Orvan Falconer M.D.   On: 07/12/2023 16:29   DG Chest Port 1 View  Result Date: 07/12/2023 CLINICAL DATA:  Shortness of breath.  Syncope. EXAM: PORTABLE CHEST 1 VIEW COMPARISON:  January 27, 2023. FINDINGS: Stable cardiomediastinal silhouette. Status post coronary artery bypass graft. Minimal left basilar subsegmental atelectasis is noted with small pleural effusion. No definite acute abnormality seen in the right lung. IMPRESSION: Minimal left basilar subsegmental atelectasis with small left pleural effusion. Electronically Signed   By: Lupita Raider M.D.   On: 07/12/2023 15:50    Historical data:  TTE 1/'24:  IMPRESSIONS  1. GLS -15.3. Left ventricular ejection fraction, by estimation, is 60 to  65%. Left ventricular ejection fraction by 3D volume is 65 %. The left  ventricle has normal function. Left ventricular  endocardial border not  optimally defined to evaluate  regional wall motion. Left ventricular diastolic parameters are  indeterminate.   2. Right ventricular systolic function is normal. The right ventricular  size is mildly enlarged.   3. Left atrial size was mildly dilated.   4. The mitral valve is normal in structure. Trivial mitral valve  regurgitation. No evidence of mitral stenosis.   5. The aortic valve is normal in structure. Aortic valve regurgitation is  not visualized. No aortic stenosis is present.   6. The inferior vena cava is normal in size with greater than 50%  respiratory variability, suggesting right atrial pressure of 3 mmHg.    EKG:  Sinus rhythm with RAD, LAE, IVCD, no overt ischemic changes  ED Course:  Treated with Lasix 40 mg IV x 1, DuoNebs.  Transferred for suspected heart failure exacerbation, recent syncopal episode.  Course notable otherwise for borderline hypotension, normal lactate.   Assessment/Plan:  77 y.o. female with hx CAD, MI, remote CABG' 96, COPD, hypertension, hyperlipidemia, breast cancer, lung cancer, who was transferred from  [facility] ED for history of syncope, volume overload suspected heart failure.  Acute exacerbation of heart failure, concern for RV failure.   Hx progressive weight gain from 129 pound to 148 pounds. BNP 2311. HS 59 -> 57. CT chest abd pelvis anasarca, mod ascites (note discussed with radiology and read re: hyperdense ascites, not c/w pneumoperitoneum possible hemoperitoneum).  Trace left pleural effusion, dependent left lower lobe atelectasis.  Overall clinical picture more consistent with RV failure with no significant pulmonary edema, and her underlying lung disease with COPD.  Last TTE 1/' 24 included above with LVEF 60-65, indeterminate diastolic function, mild RV enlargement with normal RV systolic function. - Status post diuresis Lasix 40 mg IV at outside hospital, give additional 40 mg IV x 1 - TTE ordered - Cardiology  consult in the morning, not contacted overnight. - Daily weights, INO, heart failure navigator, TOC consult. - Agement of underlying respiratory failure per below       Body mass index is 27.07 kg/m. ***   DVT prophylaxis:  {Blank single:19197::"Lovenox","SQ Heparin","IV heparin gtts","Xarelto","Eliquis","Coumadin","SCDs","***"} Code Status:  {Blank single:19197::"Full Code","DNR with Intubation","DNR/DNI(Do NOT Intubate)","Comfort Care","***"} Diet:  Diet Orders (From admission, onward)     Start     Ordered   07/12/23 2148  Diet 2 gram sodium Room service appropriate? Yes; Fluid consistency: Thin  Diet effective now       Question Answer Comment  Room service appropriate? Yes   Fluid consistency: Thin      07/12/23 2147           Family Communication:  ***  Consults:  ***  Admission status:   {Blank single:19197::"Observation","Inpatient"}, {Blank single:19197::"Med-Surg","Telemetry bed","Step Down Unit"}  Severity of Illness: {Observation/Inpatient:21159}   Dolly Rias, MD [facility]  How to contact the [facility] Attending or Consulting provider 7A - 7P or covering provider during after hours 7P -7A, for this patient.  Check the care team in [facility] and look for a) attending/consulting [facility] provider listed and b) the [facility] team listed Log into [email or web address] and use [place]'s universal password to access. If you do not have the password, please contact the hospital operator. Locate the [facility] provider you are looking for under [facility] and page to a number that you can be directly reached. If you still have difficulty reaching the provider, please page the [facility] (Director on Call) for the Hospitalists listed on amion for assistance.  07/12/2023, 11:32 PM

## 2023-07-12 NOTE — ED Notes (Signed)
Patient transported to CT 

## 2023-07-12 NOTE — ED Triage Notes (Signed)
Syncope last night when was sitting in her chair. Worsening in shortness of breath past two days , Hx CHF . Bilateral LE swelling .  Denies chest pain . Hypotensive  in triage .  Alert and oriented x 4 .

## 2023-07-12 NOTE — ED Provider Notes (Signed)
[place] [facility] Provider Note   CSN: [id number] Arrival date & time: 07/12/23  1354     History  Chief Complaint  Patient presents with   Loss of Consciousness    Denise Mckenzie is a 77 y.o. female who  has a past medical history of Arthritis, Breast cancer (HCC) (dx'd 09/2015), COPD (chronic obstructive pulmonary disease) (HCC), Coronary artery disease, Depression, Diabetes mellitus (HCC) (10/22/2015), Full dentures, History of bronchitis, History of pneumonia, Hyperlipidemia, Hypertension, Hypothyroidism (10/22/2015), lung ca (dx;d 09/2015), Myocardial infarction [facility]) (July 1995),Personal history of radiation therapy, Stress incontinence, and Uterine cancer. She presents to the emergency department for evaluation of syncope swelling and shortness of breath.  Review of EMR shows last echocardiogram in January 2024 showed normal cardiac function with EF of 60 to 65%..  Patient reports that she has had progressively worsening lower extremity edema and weight gain since June 2024.  Patient reports that she used to weigh 129 and now weighs 148.  She reports significant difficulty ambulating secondary to the weight of her lower extremities.  Patient reports that yesterday she was seated in a kitchen chair.  She had loss of consciousness and woke up on the floor.  She had no prodrome prior to waking up on the floor.  She called out for her husband who was unable to lift her off the floor.  She denies chest pain.  Patient reports that her cardiology group has been managing her edema with furosemide.  She was on 60 twice daily however swelling was getting worse.  Patient denies racing heart, chest pain   Loss of Consciousness      Home Medications Prior to Admission medications   Medication Sig Start Date End Date Taking? Authorizing Provider  acetaminophen (TYLENOL) 500 MG tablet Take 1,000 mg by mouth 2 (two) times daily as needed (pain). Reported on 01/20/2016     [provider]  albuterol (VENTOLIN HFA) 108 (90 Base) MCG/ACT inhaler Inhale 2 puffs into the lungs 4 (four) times daily as needed for shortness of breath. 05/18/21   [provider]  amitriptyline (ELAVIL) 50 MG tablet Take 50 mg by mouth at bedtime.      [provider]  aspirin EC 81 MG tablet Take 1 tablet (81 mg total) by mouth daily. Swallow whole. 04/16/21   Baldo Daub, MD  atorvastatin (LIPITOR) 40 MG tablet Take 40 mg by mouth at bedtime.     [provider]  Calcium Citrate-Vitamin D (CALCIUM + D PO) Take 2 tablets by mouth 3 (three) times daily. Each tablet Calcium 300 mg, Vitamin D3 250 I.U.    [provider]  FARXIGA 10 MG TABS tablet TAKE 1 TABLET BY MOUTH DAILY BEFORE BREAKFAST. 10/02/22   Georgeanna Lea, MD  ketoconazole (NIZORAL) 2 % cream Apply 1 application topically daily. 06/04/20   Magrinat, Valentino Hue, MD  KLOR-CON M20 20 MEQ tablet TAKE 1 TABLET BY MOUTH TWICE A DAY 06/11/23   Georgeanna Lea, MD  Krill Oil 1000 MG CAPS Take 1,000 mg by mouth daily.    [provider]  levothyroxine (SYNTHROID, LEVOTHROID) 88 MCG tablet Take 88 mcg by mouth daily before breakfast.     [provider]  metFORMIN (GLUCOPHAGE) 500 MG tablet Take 500 mg by mouth daily. 09/17/15   [provider]  metoprolol succinate (TOPROL-XL) 50 MG 24 hr tablet Take 50 mg by mouth daily. 05/16/21   [provider]  nitroGLYCERIN (NITROSTAT) 0.4 MG SL tablet Place 1 tablet (0.4 mg total) under the tongue every 5 (five) minutes as needed for chest pain. Reported on 01/28/2016 05/29/21   Baldo Daub, MD  torsemide (DEMADEX) 20 MG tablet Take 2 tablets (40 mg total) by mouth 2 (two) times daily. 07/02/23   Sharlene Dory, PA-C      Allergies    Patient has no known allergies.    Review of Systems   Review of Systems  Cardiovascular:  Positive for syncope.    Physical Exam Updated Vital Signs BP 97/68 (BP  Location: Right Arm)   Pulse (!) 117   Temp 97.8 F (36.6 C)   Resp 20   Wt 67.1 kg   SpO2 98%   BMI 27.07 kg/m  Physical Exam  ED Results / Procedures / Treatments   Labs (all labs ordered are listed, but only abnormal results are displayed) Labs Reviewed  CBC WITH DIFFERENTIAL/PLATELET - Abnormal; Notable for the following components:      Result Value   RBC 6.06 (*)    Hemoglobin 17.6 (*)    HCT 55.0 (*)    RDW 17.8 (*)    Platelets 124 (*)    Neutro Abs 8.7 (*)    All other components within normal limits  BASIC METABOLIC PANEL  BRAIN NATRIURETIC PEPTIDE  HEPATIC FUNCTION PANEL  TSH  T4, FREE  T3, FREE  TROPONIN I (HIGH SENSITIVITY)    EKG None  Radiology No results found.  Procedures .Critical Care  Performed by: Arthor Captain, PA-C Authorized by: Arthor Captain, PA-C   Critical care provider statement:    Critical care time (minutes):  60   Critical care time was exclusive of:  Separately billable procedures and treating other patients   Critical care was necessary to treat or prevent imminent or life-threatening deterioration of the following conditions:  Respiratory failure and cardiac failure   Critical care was time spent personally by me on the following activities:  Development of treatment plan with patient or surrogate, discussions with consultants, evaluation of patient's response to treatment, examination of patient, ordering and review of laboratory studies, ordering and review of radiographic studies, ordering and performing treatments and interventions, pulse oximetry, re-evaluation of patient's condition, review of old charts, obtaining history from patient or surrogate and interpretation of cardiac output measurements     Medications Ordered in ED Medications  ipratropium-albuterol (DUONEB) 0.5-2.5 (3) MG/3ML nebulizer solution 3 mL (3 mLs Nebulization Given 07/12/23 1455)  albuterol (PROVENTIL) (2.5 MG/3ML) 0.083% nebulizer solution 2.5  mg (2.5 mg Nebulization Given 07/12/23 1455)    ED Course/ Medical Decision Making/ A&P Clinical Course as of 07/12/23 2000  Mon Jul 12, 2023  1531 Sodium(!): 130 [AH]  1531 Potassium(!): 5.7 [AH]  1531 WBC: 10.3 [AH]  1613 Troponin I (High Sensitivity)(!): 59 [AH]  1648 CT Head Wo Contrast [AH]  1648 CT Cervical Spine Wo Contrast [AH]  1648 DG Chest Port 1 View I personally visualized and interpreted the images using our PACS system. Acute findings include:  No acute findings on CT head or chest Portable 1 view chest x-ray with minimal Small left pleural effusion [AH]  1907 ECG Heart Rate(!): 120 [AH]  1907 Resp(!): 21 [AH]  1907 BP: 90/72 [AH]    Clinical Course User Index [AH] Arthor Captain, PA-C  Medical Decision Making This patient presents to the ED for concern of syncope, sob/ edema, this involves an extensive number of treatment options, and is a complaint that carries with it a high risk of complications and morbidity.  The differential for syncope is extensive and includes, but is not limited to: arrythmia (Vtach, SVT, SSS, sinus arrest, AV block, bradycardia) aortic stenosis, AMI, HOCM, PE, atrial myxoma, pulmonary hypertension, orthostatic hypotension, (hypovolemia, drug effect, GB syndrome, micturition, cough, swall) carotid sinus sensitivity, Seizure, TIA/CVA, hypoglycemia,  Vertigo.   Co morbidities:       has a past medical history of Arthritis, Breast cancer (HCC) (dx'd 09/2015), COPD (chronic obstructive pulmonary disease) (HCC), Coronary artery disease, Depression, Diabetes mellitus (HCC) (10/22/2015), Full dentures, History of bronchitis, History of pneumonia, Hyperlipidemia, Hypertension, Hypothyroidism (10/22/2015), lung ca (dx;d 09/2015), Myocardial infarction [facility]) (July 1995), Osteoporosis (10/22/2015), Personal history of radiation therapy, Stress incontinence, and Uterine cancer (HCC) (dx'd 1968).   Social Determinants of  Health:       SDOH Screenings Tobacco Use: Medium Risk (07/12/2023)   Additional history:  {Additional history obtained from daughter {External records from outside source obtained and reviewed including outside cardiology notes, previous echo  Lab Tests:  I Ordered, and personally interpreted labs.  The pertinent results include:   As per ED course   Imaging Studies:  I ordered imaging studies including CT head/nk. C/a/P, and cxr I independently visualized and interpreted imaging which showed chest x-ray showed small pleural effusion. CT head and neck negative for acute finding.  CT chest abdomen pelvis without obvious signs of portal vein thrombus or mass.  Large ascites and cutaneous edema noted I agree with the radiologist interpretation  Cardiac Monitoring/ECG:       The patient was maintained on a cardiac monitor.  I personally viewed and interpreted the cardiac monitored which showed an underlying rhythm of: Sinus tachycardia  Medicines ordered and prescription drug management:  I ordered medication including Medications ipratropium-albuterol (DUONEB) 0.5-2.5 (3) MG/3ML nebulizer solution 3 mL (3 mLs Nebulization Given 07/12/23 1455) albuterol (PROVENTIL) (2.5 MG/3ML) 0.083% nebulizer solution 2.5 mg (2.5 mg Nebulization Given 07/12/23 1455) furosemide (LASIX) injection 40 mg (40 mg Intravenous Given 07/12/23 1747) for shortness of breath Reevaluation of the patient after these medicines showed that the patient improved I have reviewed the patients home medicines and have made adjustments as needed  Test Considered:       Considered CT angiogram of the chest however have low suspicion for acute PE  Critical Interventions:       Nasal cannula for respiratory failure requiring 2 L, supportive care  Consultations Obtained: Dr. Pola Corn for admission  Problem List / ED Course:       (R60.1) Anasarca  (primary encounter diagnosis)  (N17.9) AKI (acute kidney  injury) (HCC)  (R79.89) Elevated troponin  (R55) Syncope and collapse   MDM: 77 year old female who presents emergency department with anasarca, shortness of breath, progressively worsening swelling despite use of diuretics.  On exam patient has both ascites and swelling of the lower extremities.  She likely has a mixed cause of her respiratory failure including her COPD and ascites likely limiting her ability to fully inflate the lungs.  He has a minimal pleural effusion.  I suspect that the patient has new onset heart failure.  She does not appear to have any significant obstruction of the IVC or portal vein system. Liver enzymes are normal. Patient also had syncope without prodrome which is highly concerning.  She does  not appear to be in cardiogenic shock given the fact that her lactic acid is within normal limits and she appears to be perfusing well.  Patient will need admission for diuresis, management of soft pressures, acute kidney injury and suspected new onset heart failure.   Dispostion:  After consideration of the diagnostic results and the patients response to treatment, I feel that the patent would benefit from admission.    Amount and/or Complexity of Data Reviewed Labs: ordered. Decision-making details documented in ED Course. Radiology: ordered. Decision-making details documented in ED Course.  Risk Prescription drug management. Decision regarding hospitalization.          Final Clinical Impression(s) / ED Diagnoses Final diagnoses:  Anasarca  AKI (acute kidney injury) (HCC)  Elevated troponin  Syncope and collapse  Acute respiratory failure with hypoxia [facility])    Rx / DC Orders ED Discharge Orders     None         Arthor Captain, PA-C 07/12/23 2005    Virgina Norfolk, DO 07/12/23 2229

## 2023-07-12 NOTE — ED Notes (Signed)
Family at bedside. 

## 2023-07-12 NOTE — H&P (Addendum)
History and Physical    Denise Mckenzie KZS:[id number] DOB: 1946/06/22 DOA: 07/12/2023  PCP: Burton Apley, MD   Patient coming from:  Transfer from [facility]   Chief Complaint:  Chief Complaint  Patient presents with   Loss of Consciousness    HPI:  Denise Mckenzie is a 77 y.o. female with hx of CAD, MI, remote CABG' 96, COPD, hypertension, hyperlipidemia, breast cancer, lung cancer, who was transferred from [facility] ED for history of syncope, volume overload suspected heart failure.  She reports gradual weight gain since August of this year, weight gradually up from 129 pound to 148 pounds.  Along with this has had gradually worsening dyspnea on exertion.  Now she can hardly walk across the room without becoming short of breath.  Denies any exertional chest pain.  She has chronic 1 pillow orthopnea.  She has had progressive lower extremity edema.  She has been seeing cardiology for diuretic management and over the time period of her symptoms diuretics have been increased gradually from Lasix 20 mg twice daily, most recently increased to torsemide 40 mg twice daily.  She reports having low blood pressures at home similar to current systolics in the 90s. On my interview she does not talk much about syncope.  Had syncopal episode yesterday while seated without prodrome, unable to stand after the episode. Otherwise reports cough, minimal white sputum, no sick contacts no other URI symptoms, no fever/chills.  Feels generally weak and trouble lifting her legs due to weight of fluid.   Review of Systems:  ROS complete and negative except as marked above   No Known Allergies  Prior to Admission medications   Medication Sig Start Date End Date Taking? Authorizing Provider  acetaminophen (TYLENOL) 500 MG tablet Take 1,000 mg by mouth 2 (two) times daily as needed (pain). Reported on 01/20/2016    [provider]  albuterol (VENTOLIN HFA) 108 (90 Base) MCG/ACT inhaler Inhale 2 puffs into the lungs 4  (four) times daily as needed for shortness of breath. 05/18/21   [provider]  amitriptyline (ELAVIL) 50 MG tablet Take 50 mg by mouth at bedtime.      [provider]  aspirin EC 81 MG tablet Take 1 tablet (81 mg total) by mouth daily. Swallow whole. 04/16/21   Baldo Daub, MD  atorvastatin (LIPITOR) 40 MG tablet Take 40 mg by mouth at bedtime.     [provider]  Calcium Citrate-Vitamin D (CALCIUM + D PO) Take 2 tablets by mouth 3 (three) times daily. Each tablet Calcium 300 mg, Vitamin D3 250 I.U.    [provider]  FARXIGA 10 MG TABS tablet TAKE 1 TABLET BY MOUTH DAILY BEFORE BREAKFAST. 10/02/22   Georgeanna Lea, MD  ketoconazole (NIZORAL) 2 % cream Apply 1 application topically daily. 06/04/20   Magrinat, Valentino Hue, MD  KLOR-CON M20 20 MEQ tablet TAKE 1 TABLET BY MOUTH TWICE A DAY 06/11/23   Georgeanna Lea, MD  Krill Oil 1000 MG CAPS Take 1,000 mg by mouth daily.    [provider]  levothyroxine (SYNTHROID, LEVOTHROID) 88 MCG tablet Take 88 mcg by mouth daily before breakfast.     [provider]  metFORMIN (GLUCOPHAGE) 500 MG tablet Take 500 mg by mouth daily. 09/17/15   [provider]  metoprolol succinate (TOPROL-XL) 50 MG 24 hr tablet Take 50 mg by mouth daily. 05/16/21   [provider]  nitroGLYCERIN (NITROSTAT) 0.4 MG SL tablet Place 1 tablet (  0.4 mg total) under the tongue every 5 (five) minutes as needed for chest pain. Reported on 01/28/2016 05/29/21   Baldo Daub, MD  torsemide (DEMADEX) 20 MG tablet Take 2 tablets (40 mg total) by mouth 2 (two) times daily. 07/02/23   Sharlene Dory, PA-C    Past Medical History:  Diagnosis Date   Acute exacerbation of CHF (congestive heart failure) (HCC) 07/12/2023   Arthritis    Breast cancer (HCC) dx'd 09/2015   COPD (chronic obstructive pulmonary disease) (HCC)    Coronary artery disease    Depression    Diabetes mellitus (HCC) 10/22/2015   Full  dentures    full upper dentures, plate on bottom   History of bronchitis    History of pneumonia    Hyperlipidemia    Hypertension    Hypothyroidism 10/22/2015   lung ca dx;d 09/2015   lung cancer   Myocardial infarction [facility]) July 1995   Osteoporosis 10/22/2015   Personal history of radiation therapy    Stress incontinence    Uterine cancer (HCC) dx'd 1968    Past Surgical History:  Procedure Laterality Date   ABDOMINAL HYSTERECTOMY     1968   APPENDECTOMY     with hysterectomy   BLADDER SURGERY     1972   BREAST BIOPSY Left 11/04/2015   Malignant   BREAST BIOPSY Left 11/04/2015   Malignant   BREAST LUMPECTOMY Left 11/19/2015   CARDIAC CATHETERIZATION  1995, 1996   Dr. 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   LUNG BIOPSY N/A 11/13/2015   Procedure: RIGHT LUNG BIOPSY;  Surgeon: Delight Ovens, MD;  Location: [facility] OR;  Service: Thoracic;  Laterality: N/A;   MULTIPLE TOOTH EXTRACTIONS     RADIOACTIVE SEED GUIDED PARTIAL MASTECTOMY WITH AXILLARY SENTINEL LYMPH NODE BIOPSY Left 11/19/2015   Procedure: RADIOACTIVE SEED GUIDED PARTIAL MASTECTOMY WITH AXILLARY SENTINEL LYMPH NODE BIOPSY;  Surgeon: Manus Rudd, MD;  Location: [facility];  Service: General;  Laterality: Left;   RE-EXCISION OF BREAST LUMPECTOMY Left 12/03/2015   Procedure: RE-EXCISION INFERIOR MARGIN OF LEFT BREAST LUMPECTOMY;  Surgeon: Manus Rudd, MD;  Location: [facility] OR;  Service: General;  Laterality: Left;   TMJ ARTHROPLASTY     1979   TONSILLECTOMY     VIDEO BRONCHOSCOPY WITH ENDOBRONCHIAL NAVIGATION N/A 11/13/2015   Procedure: VIDEO BRONCHOSCOPY WITH ENDOBRONCHIAL NAVIGATION, with placement of fudicial markers;  Surgeon: Delight Ovens, MD;  Location:  [facility] OR;  Service: Thoracic;  Laterality: N/A;     reports that she quit smoking about 28 years ago. Her smoking use included cigarettes. She has been exposed to tobacco smoke. She has never used smokeless tobacco. She reports that she does not drink alcohol and does not use drugs.  Family History  Problem Relation Age of Onset   Alzheimer's disease Mother    Heart attack Father    Breast cancer Neg Hx      Physical Exam: Vitals:   07/12/23 2022 07/12/23 2130 07/12/23 2200 07/12/23 2230  BP: 101/84 (!) 82/66 (!) 79/53 (!) 74/60  Pulse: (!) 121 (!) 120 (!) 118 (!) 111  Resp:  Temp: 97.9 F (36.6 C)     TempSrc: Oral     SpO2: 95% 98% 96% 98%  Weight:        Gen: Awake, alert, NAD   CV: Regular, tachycardic, normal S1, S2, no murmurs  Resp: Normal WOB, rales in the bases Abd: Obese, slightly distended, normoactive, nontender MSK: There is 3+ soft pitting edema up towards the knee then continues in a dependent fashion presacrally.  No significant edema in the upper extremities Skin: Excoriations and numerous healing scabs over the upper and lower extremities.  Additionally there is a faint petechial rash distally in the lower extremities which is nonpalpable/nontender.  Over the right knee there is increased warmth and erythema without induration.  Neuro: Alert and interactive  Psych: euthymic, appropriate    Data review:   Labs reviewed, notable for:   Lactate 1.4 BNP 2311 High-sensitivity Trop 59 -> 57 NA 130 K5.7, creatinine 1.5, baseline 0.9 Albumin 3.9 Increased hemoglobin 17 Platelet 124  Micro:  Results for orders placed or performed in visit on 07/18/19  Novel Coronavirus, NAA ([facility])     Status: None   Collection Time: 07/18/19 12:00 AM   Specimen: Nasopharyngeal(NP) swabs in vial transport medium   NASOPHARYNGE  TESTING  Result Value Ref Range Status   SARS-CoV-2, NAA Not Detected Not Detected Final    Comment: Testing was performed using the  cobas(R) SARS-CoV-2 test. This nucleic acid amplification test was developed and its performance characteristics determined by [employer]. Nucleic acid amplification tests include PCR and TMA. This test has not been FDA cleared or approved. This test has been authorized by FDA under an Emergency Use Authorization (EUA). This test is only authorized for the duration of time the declaration that circumstances exist justifying the authorization of the emergency use of in vitro diagnostic tests for detection of SARS-CoV-2 virus and/or diagnosis of COVID-19 infection under section 564(b)(1) of the Act, 21 U.S.C. 161WRU-0(A) (1), unless the authorization is terminated or revoked sooner. When diagnostic testing is negative, the possibility of a false negative result should be considered in the context of a patient's recent exposures and the presence of clinical signs and symptoms consistent with COVID-19. An individual without symptoms  of COVID-19 and who is not shedding SARS-CoV-2 virus would expect to have a negative (not detected) result in this assay.     Imaging reviewed:  CT CHEST ABDOMEN PELVIS WO CONTRAST  Result Date: 07/12/2023 CLINICAL DATA:  Poly trauma, penetrating. Shortness of breath. Syncope. History of breast cancer. EXAM: CT CHEST, ABDOMEN AND PELVIS WITHOUT CONTRAST TECHNIQUE: Multidetector CT imaging of the chest, abdomen and pelvis was performed following the standard protocol without IV contrast. RADIATION DOSE REDUCTION: This exam was performed according to the departmental dose-optimization program which includes automated exposure control, adjustment of the mA and/or kV according to patient size and/or use of iterative reconstruction technique. COMPARISON:  PET-CT 06/10/2021.  Chest CT 12/24/2020. FINDINGS: CT CHEST FINDINGS Cardiovascular: Diffuse atherosclerosis of the aorta, great vessels and coronary arteries status post median sternotomy and CABG. Mild  central enlargement of the pulmonary arteries. The heart size is normal. There is no pericardial effusion. Mediastinum/Nodes: There are no enlarged mediastinal, hilar, axillary or internal mammary lymph nodes. Surgical clips are present within the left axilla. Hilar assessment is limited by the lack of intravenous contrast, although the hilar contours appear unchanged. The thyroid gland, trachea and esophagus demonstrate no significant findings. Lungs/Pleura: Trace left pleural effusion with dependent left lower lobe atelectasis. No  evidence of pneumothorax. Stable probable radiation changes anteriorly in the right upper lobe adjacent to the fiducial markers. No new or enlarging pulmonary nodules identified. Mild underlying centrilobular emphysema. Musculoskeletal/Chest wall: No chest wall mass or suspicious osseous findings. Stable old fracture of the right 2nd rib anteriorly. Previous median sternotomy. There are degenerative changes in the spine associated with a convex right thoracic scoliosis. Postsurgical changes in the left breast and left axilla. CT ABDOMEN AND PELVIS FINDINGS Hepatobiliary: No focal hepatic abnormalities are identified on noncontrast imaging. There is a small gallstone. The gallbladder appears mildly distended without significant wall thickening. No significant biliary dilatation. Pancreas: Atrophy. No focal abnormality, ductal dilatation or surrounding inflammation. Spleen: No evidence of acute splenic injury. The spleen is normal in size without focal abnormality. Adrenals/Urinary Tract: Stable low-density adrenal adenomas bilaterally; no specific follow-up imaging recommended. No evidence of urinary tract calculus, suspicious renal lesion or hydronephrosis. The bladder appears normal for its degree of distention. Stomach/Bowel: No enteric contrast administered. The stomach appears unremarkable for its degree of distension. No evidence of bowel wall thickening, distention or surrounding  inflammatory change. Vascular/Lymphatic: There are no enlarged abdominal or pelvic lymph nodes. Diffuse aortic and branch vessel atherosclerosis without evidence of aneurysm. Reproductive: Evidence of prior hysterectomy. No suspicious adnexal findings. Other: New moderate amount of low-density ascites without evidence of sentinel clot, dependent high density components or pneumoperitoneum. Intact abdominal wall. There is generalized edema throughout the subcutaneous and intra-abdominal fat. Chronic subcutaneous calcifications in the buttocks bilaterally. Musculoskeletal: No evidence of acute fracture or dislocation. IMPRESSION: 1. No evidence of acute traumatic injury within the chest, abdomen or pelvis. 2. New moderate amount of nonspecific low-density ascites without evidence of sentinel clot, dependent high density components or pneumoperitoneum. Generalized soft tissue edema. 3. Trace left pleural effusion with dependent left lower lobe atelectasis. 4. Stable probable radiation changes anteriorly in the right upper lobe adjacent to the fiducial markers. No new or enlarging pulmonary nodules identified. 5. Cholelithiasis with mild gallbladder distention but no significant wall thickening or biliary dilatation. 6. Stable bilateral adrenal adenomas. 7. Aortic Atherosclerosis (ICD10-I70.0) and Emphysema (ICD10-J43.9). Electronically Signed   By: Carey Bullocks M.D.   On: 07/12/2023 17:07   CT Head Wo Contrast  Result Date: 07/12/2023 CLINICAL DATA:  Polytrauma, blunt. EXAM: CT HEAD WITHOUT CONTRAST CT CERVICAL SPINE WITHOUT CONTRAST TECHNIQUE: Multidetector CT imaging of the head and cervical spine was performed following the standard protocol without intravenous contrast. Multiplanar CT image reconstructions of the cervical spine were also generated. RADIATION DOSE REDUCTION: This exam was performed according to the departmental dose-optimization program which includes automated exposure control, adjustment  of the mA and/or kV according to patient size and/or use of iterative reconstruction technique. COMPARISON:  Head CT 10/22/2015. FINDINGS: CT HEAD FINDINGS Brain: No acute intracranial hemorrhage. Gray-white differentiation is preserved. No hydrocephalus or extra-axial collection. No mass effect or midline shift. Vascular: No hyperdense vessel or unexpected calcification. Skull: No calvarial fracture or suspicious bone lesion. Skull base is unremarkable. Sinuses/Orbits: No acute finding. Other: None. CT CERVICAL SPINE FINDINGS Alignment: No traumatic malalignment. Skull base and vertebrae: No acute fracture. Normal craniocervical junction. No suspicious bone lesions. Soft tissues and spinal canal: No prevertebral fluid or swelling. No visible canal hematoma. Disc levels: Multilevel cervical spondylosis, worst at C3-4, where there is at least mild spinal canal stenosis. Upper chest: No acute findings. Other: Atherosclerotic calcifications of the carotid bulbs. IMPRESSION: 1. No acute intracranial abnormality. 2. No acute cervical spine fracture or traumatic  malalignment. 3. Multilevel cervical spondylosis, worst at C3-4, where there is at least mild spinal canal stenosis. Electronically Signed   By: Orvan Falconer M.D.   On: 07/12/2023 16:29   CT Cervical Spine Wo Contrast  Result Date: 07/12/2023 CLINICAL DATA:  Polytrauma, blunt. EXAM: CT HEAD WITHOUT CONTRAST CT CERVICAL SPINE WITHOUT CONTRAST TECHNIQUE: Multidetector CT imaging of the head and cervical spine was performed following the standard protocol without intravenous contrast. Multiplanar CT image reconstructions of the cervical spine were also generated. RADIATION DOSE REDUCTION: This exam was performed according to the departmental dose-optimization program which includes automated exposure control, adjustment of the mA and/or kV according to patient size and/or use of iterative reconstruction technique. COMPARISON:  Head CT 10/22/2015. FINDINGS: CT  HEAD FINDINGS Brain: No acute intracranial hemorrhage. Gray-white differentiation is preserved. No hydrocephalus or extra-axial collection. No mass effect or midline shift. Vascular: No hyperdense vessel or unexpected calcification. Skull: No calvarial fracture or suspicious bone lesion. Skull base is unremarkable. Sinuses/Orbits: No acute finding. Other: None. CT CERVICAL SPINE FINDINGS Alignment: No traumatic malalignment. Skull base and vertebrae: No acute fracture. Normal craniocervical junction. No suspicious bone lesions. Soft tissues and spinal canal: No prevertebral fluid or swelling. No visible canal hematoma. Disc levels: Multilevel cervical spondylosis, worst at C3-4, where there is at least mild spinal canal stenosis. Upper chest: No acute findings. Other: Atherosclerotic calcifications of the carotid bulbs. IMPRESSION: 1. No acute intracranial abnormality. 2. No acute cervical spine fracture or traumatic malalignment. 3. Multilevel cervical spondylosis, worst at C3-4, where there is at least mild spinal canal stenosis. Electronically Signed   By: Orvan Falconer M.D.   On: 07/12/2023 16:29   DG Chest Port 1 View  Result Date: 07/12/2023 CLINICAL DATA:  Shortness of breath.  Syncope. EXAM: PORTABLE CHEST 1 VIEW COMPARISON:  January 27, 2023. FINDINGS: Stable cardiomediastinal silhouette. Status post coronary artery bypass graft. Minimal left basilar subsegmental atelectasis is noted with small pleural effusion. No definite acute abnormality seen in the right lung. IMPRESSION: Minimal left basilar subsegmental atelectasis with small left pleural effusion. Electronically Signed   By: Lupita Raider M.D.   On: 07/12/2023 15:50    Historical data:  TTE 1/'24:  IMPRESSIONS  1. GLS -15.3. Left ventricular ejection fraction, by estimation, is 60 to  65%. Left ventricular ejection fraction by 3D volume is 65 %. The left  ventricle has normal function. Left ventricular endocardial border not  optimally  defined to evaluate  regional wall motion. Left ventricular diastolic parameters are  indeterminate.   2. Right ventricular systolic function is normal. The right ventricular  size is mildly enlarged.   3. Left atrial size was mildly dilated.   4. The mitral valve is normal in structure. Trivial mitral valve  regurgitation. No evidence of mitral stenosis.   5. The aortic valve is normal in structure. Aortic valve regurgitation is  not visualized. No aortic stenosis is present.   6. The inferior vena cava is normal in size with greater than 50%  respiratory variability, suggesting right atrial pressure of 3 mmHg.    EKG:  Sinus rhythm with RAD, LAE, IVCD, no overt ischemic changes -> Update review with cardiologist, feels more likely Aflutter with 2:1 conduction   ED Course:  Treated with Lasix 40 mg IV x 1, DuoNebs.  Transferred for suspected heart failure exacerbation, recent syncopal episode.  Course notable otherwise for borderline hypotension, normal lactate.   Assessment/Plan:  77 y.o. female with hx CAD, MI, remote  CABG' 96, COPD, hypertension, hyperlipidemia, breast cancer, lung cancer, who was transferred from [facility] ED for history of syncope, volume overload suspected heart failure.  Acute exacerbation of heart failure, concern for RV failure.   Hx progressive weight gain from 129 pound to 148 pounds. BNP 2311. HS 59 -> 57. CT chest abd pelvis anasarca, mod ascites (note discussed with radiology and read re: hyperdense ascites, not c/w pneumoperitoneum possible hemoperitoneum).  Trace left pleural effusion, dependent left lower lobe atelectasis.  Overall clinical picture more consistent with RV failure with no significant pulmonary edema, and her underlying lung disease with COPD.  Last TTE 1/' 24 included above with LVEF 60-65, indeterminate diastolic function, mild RV enlargement with normal RV systolic function. - Status post diuresis Lasix 40 mg IV at outside hospital, give  additional 40 mg IV x 1 - TTE ordered - Cardiology consult in the morning, not contacted overnight. - Daily weights, INO, heart failure navigator, TOC consult. - management of underlying lung disease per below - Check TSH, fT4, UA, UPCR for additional evaluation for anasarca  Syncope Syncopal episode 11/24, without prodrome.  Suspect possibly low EABV, cardiogenic in nature.   -Telemonitoring - Evaluation of underlying heart failure per above - PT evaluation  Hypotension, Suspect cardiogenic Pressures remain borderline initially 90s over 60s, slightly worsening.  Tachycardic 110s to 120s.  Clinical history most suspicious for RV failure.  Less likely underlying infection, has localized cellulitis and treating per below.  Although ? Hemoperitoneum, clinically without abd symptoms, Hb is high at 17, suspect more chronic process causing ascites. Lactate initially normal - Check blood cultures, hold on antibiotics for now. - Repeat lactate - Start on midodrine 5 mg p.o. 3 times daily.  If worsening BP needs escalation for ICU - Continue diuresis per above for heart failure  Aflutter, with RVR  Per cardiology review, appears c/w Aflutter with 2:1 conduction. Recommends for anticoagulation, possible cardioversion later during hospitalization. With rates steady ~120 and borderline pressures / not anticoagulated, recommends further management directed at volume status as initial step.  -Management of heart failure with diuresis - For now hold off on additional rate control, amiodarone - Due to possible hemoperitoneum hold off on anticoagulation for now   Ascites, possible hemoperitoneum CT with incidental finding of new onset ascites, hyperdense material in dependent fashion.  I discussed finding with radiology considering read of possible pneumoperitoneum.  There is no evidence of pneumoperitoneum, possible hemoperitoneum.  No other findings of malignancy noted in the abdomen.  Does have history  of breast and lung cancer more remotely.  Hemoglobin is 17 - Trend hemoglobin, type and screen, check coags - IR for paracentesis labs including cell count, culture, protein, albumin, cytology  Nonpurulent cellulitis Diffuse excoriated rash Reports worsening pruritus and excoriation with her worsening swelling.  Area over the right knee with erythema and warmth consistent with localized cellulitis likely secondary to inoculation from excoriation. - Start cefazolin 1 g IV every 8 hours - Hydroxyzine 25 mg p.o. 3 times daily as needed for pruritus  AKI stage I, Mild hyperkalemia Baseline creatinine approximately 0.9, elevated 1.5 on admission.  Suspect cardiorenal in setting of suspected RV failure. 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    DVT prophylaxis:  SCDs Code Status:  Full Code Diet:  Diet Orders (From admission, onward)     Start     Ordered   07/12/23 2148  Diet 2 gram sodium Room service appropriate? Yes; Fluid consistency: Thin  Diet effective now       Question Answer Comment  Room service appropriate? Yes   Fluid consistency: Thin      07/12/23 2147           Family Communication:  No   Consults:  None   Admission status:   Inpatient, Step Down Unit  Severity  of Illness: The appropriate patient status for this patient is INPATIENT. Inpatient status is judged to be reasonable and necessary in order to provide the required intensity of service to ensure the patient's safety. The patient's presenting symptoms, physical exam findings, and initial radiographic and laboratory data in the context of their chronic comorbidities is felt to place them at high risk for further clinical deterioration. Furthermore, it is not anticipated that the patient will be medically stable for discharge from the hospital within 2 midnights of admission.   * I certify that at the point of admission it is my clinical judgment that the patient will require inpatient hospital care spanning beyond 2 midnights from the point of admission due to high intensity of service, high risk for further deterioration and high frequency of surveillance required.*   Dolly Rias, MD [facility]  How to contact the [facility] Attending or Consulting provider 7A - 7P or covering provider during after hours 7P -7A, for this patient.  Check the care team in [facility] and look for a) attending/consulting [facility] provider listed and b) the [facility] team listed Log into [email or web address] and use [place]'s universal password to access. If you do not have the password, please contact the hospital operator. Locate the [facility] provider you are looking for under [facility] and page to a number that you can be directly reached. If you still have difficulty reaching the provider, please page the [facility] (Director on Call) for the Hospitalists listed on amion for assistance.  07/12/2023, 11:32 PM

## 2023-07-12 NOTE — Progress Notes (Signed)
Call received from -ED attending at [facility] Request is for  -inpatient management   Information received from ED attending as below:   Denise Mckenzie is a 77 y.o. female with PMH significant for COPD, HTN, HLD, CAD/MI Presented with Anasarca, Syncope yesterday, 5lb wt loss in 5 days Has swelling in lwer extrem since June On diuretics as an outpatient by cardiologist Dr. Dulce Sellar - creatinine rising so diuresis was slowed down.  In the ED, BP in 100s, tachycardic to 100s Bnp elevated Lactic acid normal Lasix one dose 40mg  IV given  Accepted to cardiac tele bed.

## 2023-07-12 NOTE — ED Notes (Addendum)
Pillow wedge support under right buttock Pillow wedge support under (L)(R) arms

## 2023-07-12 NOTE — ED Notes (Signed)
Report called to  4 east for room 27

## 2023-07-13 ENCOUNTER — Inpatient Hospital Stay (HOSPITAL_COMMUNITY): Payer: Medicare Other

## 2023-07-13 DIAGNOSIS — I2510 Atherosclerotic heart disease of native coronary artery without angina pectoris: Secondary | ICD-10-CM

## 2023-07-13 DIAGNOSIS — I2609 Other pulmonary embolism with acute cor pulmonale: Secondary | ICD-10-CM

## 2023-07-13 DIAGNOSIS — I509 Heart failure, unspecified: Secondary | ICD-10-CM

## 2023-07-13 DIAGNOSIS — I5031 Acute diastolic (congestive) heart failure: Secondary | ICD-10-CM | POA: Diagnosis not present

## 2023-07-13 DIAGNOSIS — I951 Orthostatic hypotension: Secondary | ICD-10-CM

## 2023-07-13 DIAGNOSIS — I4892 Unspecified atrial flutter: Secondary | ICD-10-CM

## 2023-07-13 DIAGNOSIS — E7800 Pure hypercholesterolemia, unspecified: Secondary | ICD-10-CM | POA: Diagnosis not present

## 2023-07-13 DIAGNOSIS — M7989 Other specified soft tissue disorders: Secondary | ICD-10-CM

## 2023-07-13 DIAGNOSIS — I2583 Coronary atherosclerosis due to lipid rich plaque: Secondary | ICD-10-CM

## 2023-07-13 LAB — URINALYSIS, ROUTINE W REFLEX MICROSCOPIC
Bilirubin Urine: NEGATIVE
Glucose, UA: NEGATIVE mg/dL
Ketones, ur: NEGATIVE mg/dL
Nitrite: NEGATIVE
Protein, ur: NEGATIVE mg/dL
Specific Gravity, Urine: 1.01 (ref 1.005–1.030)
pH: 8 (ref 5.0–8.0)

## 2023-07-13 LAB — GLUCOSE, CAPILLARY
Glucose-Capillary: 103 mg/dL — ABNORMAL HIGH (ref 70–99)
Glucose-Capillary: 93 mg/dL (ref 70–99)
Glucose-Capillary: 115 mg/dL — ABNORMAL HIGH (ref 70–99)
Glucose-Capillary: 127 mg/dL — ABNORMAL HIGH (ref 70–99)

## 2023-07-13 LAB — CBC
HCT: 51.3 % — ABNORMAL HIGH (ref 36.0–46.0)
Hemoglobin: 16.3 g/dL — ABNORMAL HIGH (ref 12.0–15.0)
MCH: 28.6 pg (ref 26.0–34.0)
MCHC: 31.8 g/dL (ref 30.0–36.0)
MCV: 90.2 fL (ref 80.0–100.0)
Platelets: 108 10*3/uL — ABNORMAL LOW (ref 150–400)
RBC: 5.69 MIL/uL — ABNORMAL HIGH (ref 3.87–5.11)
RDW: 16.7 % — ABNORMAL HIGH (ref 11.5–15.5)
WBC: 8.4 10*3/uL (ref 4.0–10.5)
nRBC: 0 % (ref 0.0–0.2)

## 2023-07-13 LAB — TYPE AND SCREEN
ABO/RH(D): A POS
Antibody Screen: NEGATIVE

## 2023-07-13 LAB — RESP PANEL BY RT-PCR (RSV, FLU A&B, COVID)  RVPGX2
Influenza A by PCR: NEGATIVE
Influenza B by PCR: NEGATIVE
Resp Syncytial Virus by PCR: NEGATIVE
SARS Coronavirus 2 by RT PCR: NEGATIVE

## 2023-07-13 LAB — ECHOCARDIOGRAM COMPLETE
AR max vel: 2.31 cm2
AV Area VTI: 2.41 cm2
AV Area mean vel: 2.22 cm2
AV Mean grad: 1 mm[Hg]
AV Peak grad: 2.9 mm[Hg]
Ao pk vel: 0.86 m/s
Area-P 1/2: 9.37 cm2
Calc EF: 48.6 %
S' Lateral: 2.3 cm
Single Plane A2C EF: 37.2 %
Single Plane A4C EF: 58.3 %
Weight: 2373.91 [oz_av]

## 2023-07-13 LAB — ABO/RH: ABO/RH(D): A POS

## 2023-07-13 LAB — PROTEIN / CREATININE RATIO, URINE
Creatinine, Urine: 30 mg/dL
Protein Creatinine Ratio: 0.67 mg/mg{creat} — ABNORMAL HIGH (ref 0.00–0.15)
Total Protein, Urine: 20 mg/dL

## 2023-07-13 LAB — TSH: TSH: 0.321 u[IU]/mL — ABNORMAL LOW (ref 0.350–4.500)

## 2023-07-13 LAB — BASIC METABOLIC PANEL
Anion gap: 11 (ref 5–15)
BUN: 49 mg/dL — ABNORMAL HIGH (ref 8–23)
CO2: 24 mmol/L (ref 22–32)
Calcium: 8 mg/dL — ABNORMAL LOW (ref 8.9–10.3)
Chloride: 96 mmol/L — ABNORMAL LOW (ref 98–111)
Creatinine, Ser: 1.33 mg/dL — ABNORMAL HIGH (ref 0.44–1.00)
GFR, Estimated: 41 mL/min — ABNORMAL LOW (ref >60)
Glucose, Bld: 109 mg/dL — ABNORMAL HIGH (ref 70–99)
Potassium: 4.7 mmol/L (ref 3.5–5.1)
Sodium: 131 mmol/L — ABNORMAL LOW (ref 135–145)

## 2023-07-13 LAB — HEMOGLOBIN A1C
Hgb A1c MFr Bld: 7.1 % — ABNORMAL HIGH (ref 4.8–5.6)
Mean Plasma Glucose: 157.07 mg/dL

## 2023-07-13 LAB — LIPID PANEL
Cholesterol: 91 mg/dL (ref 0–200)
HDL: 28 mg/dL — ABNORMAL LOW (ref >40)
LDL Cholesterol: 42 mg/dL (ref 0–99)
Total CHOL/HDL Ratio: 3.3 {ratio}
Triglycerides: 104 mg/dL (ref <150)
VLDL: 21 mg/dL (ref 0–40)

## 2023-07-13 LAB — LACTIC ACID, PLASMA: Lactic Acid, Venous: 1.7 mmol/L (ref 0.5–1.9)

## 2023-07-13 LAB — BRAIN NATRIURETIC PEPTIDE: B Natriuretic Peptide: 2289.1 pg/mL — ABNORMAL HIGH (ref 0.0–100.0)

## 2023-07-13 LAB — PROTIME-INR
INR: 1.2 (ref 0.8–1.2)
Prothrombin Time: 15.8 s — ABNORMAL HIGH (ref 11.4–15.2)

## 2023-07-13 LAB — C-REACTIVE PROTEIN: CRP: 0.7 mg/dL (ref <1.0)

## 2023-07-13 LAB — HEMOGLOBIN AND HEMATOCRIT, BLOOD
HCT: 54.6 % — ABNORMAL HIGH (ref 36.0–46.0)
Hemoglobin: 17.4 g/dL — ABNORMAL HIGH (ref 12.0–15.0)

## 2023-07-13 LAB — SEDIMENTATION RATE: Sed Rate: 1 mm/h (ref 0–22)

## 2023-07-13 LAB — T3, FREE: T3, Free: 1.1 pg/mL — ABNORMAL LOW (ref 2.0–4.4)

## 2023-07-13 LAB — PHOSPHORUS: Phosphorus: 3.2 mg/dL (ref 2.5–4.6)

## 2023-07-13 LAB — MAGNESIUM: Magnesium: 2 mg/dL (ref 1.7–2.4)

## 2023-07-13 LAB — T4, FREE: Free T4: 0.97 ng/dL (ref 0.61–1.12)

## 2023-07-13 MED ORDER — ALBUMIN HUMAN 25 % IV SOLN
25.0000 g | Freq: Four times a day (QID) | INTRAVENOUS | Status: AC
  Administered 2023-07-13 (×4): 25 g via INTRAVENOUS
  Filled 2023-07-13 (×4): qty 100

## 2023-07-13 MED ORDER — SODIUM CHLORIDE 0.9 % IV SOLN
2.0000 g | INTRAVENOUS | Status: DC
  Administered 2023-07-13 – 2023-07-15 (×3): 2 g via INTRAVENOUS
  Filled 2023-07-13 (×3): qty 12.5

## 2023-07-13 MED ORDER — MIDODRINE HCL 5 MG PO TABS: 5.0000 mg | ORAL_TABLET | Freq: Three times a day (TID) | ORAL | Status: DC

## 2023-07-13 MED ORDER — VANCOMYCIN HCL 1500 MG/300ML IV SOLN
1500.0000 mg | Freq: Once | INTRAVENOUS | Status: AC
  Administered 2023-07-13: 1500 mg via INTRAVENOUS
  Filled 2023-07-13: qty 300

## 2023-07-13 MED ORDER — LIDOCAINE HCL 1 % IJ SOLN
INTRAMUSCULAR | Status: AC
  Filled 2023-07-13: qty 20

## 2023-07-13 MED ORDER — ENOXAPARIN SODIUM 80 MG/0.8ML IJ SOSY
70.0000 mg | PREFILLED_SYRINGE | INTRAMUSCULAR | Status: DC
  Administered 2023-07-13: 70 mg via SUBCUTANEOUS
  Filled 2023-07-13: qty 0.8

## 2023-07-13 MED ORDER — IOHEXOL 350 MG/ML SOLN
65.0000 mL | Freq: Once | INTRAVENOUS | Status: AC | PRN
  Administered 2023-07-13: 65 mL via INTRAVENOUS

## 2023-07-13 MED ORDER — VANCOMYCIN HCL 500 MG/100ML IV SOLN
500.0000 mg | INTRAVENOUS | Status: DC
  Administered 2023-07-14 – 2023-07-15 (×2): 500 mg via INTRAVENOUS
  Filled 2023-07-13 (×2): qty 100

## 2023-07-13 MED ORDER — FUROSEMIDE 10 MG/ML IJ SOLN
40.0000 mg | Freq: Once | INTRAMUSCULAR | Status: AC
  Administered 2023-07-13: 40 mg via INTRAVENOUS
  Filled 2023-07-13: qty 4

## 2023-07-13 NOTE — Progress Notes (Signed)
Pharmacy Antibiotic Note  Denise Mckenzie is a 77 y.o. female admitted on 07/12/2023 with syncope/volume overload and concerns for sepsis. PMH includes CAD, MI, HLD, breast/lung cancer. Noted to have cellulitis around right knee. Pharmacy has been consulted for cefepime and vancomycin dosing.  WBC 10.3, sCr 1.52, afebrile Blood cultures collected No antibiotics given  Plan: -Cefepime 2g IV every 24 hours -Vancomycin 1500mg  IV x1 -Vancomycin 500mg  IV every 24 hours (AUC 429, Vd 0.72, IBW) -Monitor renal function -Follow up signs of clinical improvement, LOT, de-escalation of antibiotics   Weight: 67.1 kg (148 lb)  Temp (24hrs), Avg:97.8 F (36.6 C), Min:97.8 F (36.6 C), Max:97.9 F (36.6 C)  Recent Labs  Lab 07/09/23 1131 07/12/23 1434 07/12/23 1540  WBC  --  10.3  --   CREATININE 1.74* 1.52*  --   LATICACIDVEN  --   --  1.4    Estimated Creatinine Clearance: 27 mL/min (A) (by C-G formula based on SCr of 1.52 mg/dL (H)).    No Known Allergies  Antimicrobials this admission: Cefepime 11/26 >>  Vancomycin 11/26 >>   Microbiology results: 11/25 BCx:   Thank you for allowing pharmacy to be a part of this patient's care.  Arabella Merles, PharmD. Clinical Pharmacist 07/13/2023 12:46 AM

## 2023-07-13 NOTE — Progress Notes (Signed)
Lower extremity venous duplex completed. Please see CV Procedures for preliminary results.  Shona Simpson, RVT 07/13/23 11:23 AM

## 2023-07-13 NOTE — Progress Notes (Signed)
PCCM:  Called by Dr. Lazarus Salines to evaluate patient.  Patient currently receiving diuresis.  Echocardiogram pending has bilateral lower extremity edema and soft blood pressures.  Otherwise she is awake alert talkative with no complaints.  BNP 2300.  Briefly discussed some recommendations via phone. She has put out 750cc UOP.   We will have day team check on her again today. Right now I do not think she needs ICU level care.   Josephine Igo, DO [facility] 07/13/2023 6:52 AM

## 2023-07-13 NOTE — Progress Notes (Signed)
  Progress Note   Patient: Denise Mckenzie:[id number] DOB: June 23, 1946 DOA: 07/12/2023     1 DOS: the patient was seen and examined on 07/13/2023   Brief hospital course: No notes on file  Assessment and Plan:   77 year old female with history of CAD, CABG in 1996, COPD, hypertension, hyperlipidemia, breast cancer, lung cancer transferred from [facility] be emergency department after having an episode of syncope.  Patient has been having worsening of the lower extremity edema, weight increase from 129 lb to 148 lb, worsening of the shortness of breath.  Patient hardly can walk across the room without experiencing shortness of breath.  Despite increasing the Lasix once a day to 2 times a day did not have any improvement. .  Workup in the emergency department patient was found to be in sinus rhythm  Congestive heart failure diastolic acute on chronic - cardiology is consulted  -continue with diuresis -echocardiogram showed severe right heart failure with RV dilatation -CTA chest negative for PE -plan to do right heart catheterization.    Atrial fibrillation with the RVR -continue with metoprolol  Pneumonia healthcare associated - continue vancomycin, cefepime  Acute kidney injury  -secondary to ATN from hypotension - keep systolic blood pressure greater than 100 mm Hg -added midodrine 5 mg 3 times daily.  Hyponatremia - hypervolemic hyponatremia - continue with diuresis and follow-up   Subjective:  complaining of shortness of breath.  Significant lower extremity edema  Physical Exam: Vitals:   07/13/23 1135 07/13/23 1200 07/13/23 1230 07/13/23 1548  BP: (!) 88/67 (!) 80/68 (!) 83/72 97/65  Pulse: (!) 122   (!) 121  Resp: 15   19  Temp: (!) 97.5 F (36.4 C)   97.7 F (36.5 C)  TempSrc: Oral   Oral  SpO2: 92%   97%  Weight:       Gen: Awake, alert, NAD   CV: Regular, tachycardic, normal S1, S2,.   Pericardial rub Resp: Normal WOB, rales in the bases Abd: Obese, slightly  distended, normoactive, nontender MSK: There is 3+ soft pitting edema up towards the knee then continues in a dependent fashion presacrally.  No significant edema in the upper extremities Skin: Excoriations and numerous healing scabs over the upper and lower extremities.  Additionally there is a faint petechial rash distally in the lower extremities which is nonpalpable/nontender.  Over the right knee there is increased warmth and erythema without induration.  Neuro: Alert and interactive  Psych: euthymic, appropriate  Data Reviewed: { Reviewed by me  Family Communication:  patient's g chin 2, patient bedside  Disposition: Status is: Inpatient   Planned Discharge Destination:  pending clinical improvement  Time spent:  40 minutes  Author: Susa Griffins, MD 07/13/2023 5:10 PM  For on call review [email or web address].

## 2023-07-13 NOTE — Plan of Care (Signed)

## 2023-07-13 NOTE — Progress Notes (Signed)
Echocardiogram 2D Echocardiogram has been performed.  Denise Mckenzie Denise Mckenzie 07/13/2023, 11:07 AM

## 2023-07-13 NOTE — Procedures (Signed)
Patient presented to radiology department for possible paracentesis  Only a trace amount of fluid can be identified by limited US likely due to initiation of IV diuretics overnight.  Images saved and report dictated separately.  No procedure performed.   MD notified.  Loyce Dys, MS RD PA-C 2:53 PM

## 2023-07-13 NOTE — Consult Note (Signed)
Cardiology Consultation   Patient ID: AULINE KUYPER MRN: [id number]; DOB: 05/17/1946  Admit date: 07/12/2023 Date of Consult: 07/13/2023  PCP:  Burton Apley, MD   [facility] Providers Cardiologist:  None        Patient Profile:   Denise Mckenzie is a 77 y.o. female with a hx of CAD (CABG 1996), COPD, HTN, HLD, Breast CA, Lung CA; who is being seen 07/13/2023 for the evaluation of syncope and volume overload at the request of Denise Rias MD.  History of Present Illness:   Denise Mckenzie has been feeling weak for the past several weeks and has noticed an increasing amount of lower extremity edema, which is her reason for coming to the hospital. She has also had a weight gain from 129 to 148 pounds and progressive exertional dyspnea. Her diuretics were increased outpatient, but she continues to have worsening swelling and now she finds it difficult to walk due to lower extremity pain/discomfort. Chart review notes an episode of syncope on 11/24 with no prodrome of symptoms. Denies lightheadedness/dizziness to me.   Past Medical History:  Diagnosis Date   Acute exacerbation of CHF (congestive heart failure) (HCC) 07/12/2023   Arthritis    Breast cancer (HCC) dx'd 09/2015   COPD (chronic obstructive pulmonary disease) (HCC)    Coronary artery disease    Depression    Diabetes mellitus (HCC) 10/22/2015   Full dentures    full upper dentures, plate on bottom   History of bronchitis    History of pneumonia    Hyperlipidemia    Hypertension    Hypothyroidism 10/22/2015   lung ca dx;d 09/2015   lung cancer   Myocardial infarction [facility]) July 1995   Osteoporosis 10/22/2015   Personal history of radiation therapy    Stress incontinence    Uterine cancer (HCC) dx'd 1968    Past Surgical History:  Procedure Laterality Date   ABDOMINAL HYSTERECTOMY     1968   APPENDECTOMY     with hysterectomy   BLADDER SURGERY     1972   BREAST BIOPSY Left 11/04/2015   Malignant   BREAST  BIOPSY Left 11/04/2015   Malignant   BREAST LUMPECTOMY Left 11/19/2015   CARDIAC CATHETERIZATION  1995, 1996   Dr. 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   LUNG BIOPSY N/A 11/13/2015   Procedure: RIGHT LUNG BIOPSY;  Surgeon: Delight Ovens, MD;  Location: [facility] OR;  Service: Thoracic;  Laterality: N/A;   MULTIPLE TOOTH EXTRACTIONS     RADIOACTIVE SEED GUIDED PARTIAL MASTECTOMY WITH AXILLARY SENTINEL LYMPH NODE BIOPSY Left 11/19/2015   Procedure: RADIOACTIVE SEED GUIDED PARTIAL MASTECTOMY WITH AXILLARY SENTINEL LYMPH NODE BIOPSY;  Surgeon: Manus Rudd, MD;  Location: [facility];  Service: General;  Laterality: Left;   RE-EXCISION OF BREAST LUMPECTOMY Left 12/03/2015   Procedure: RE-EXCISION INFERIOR MARGIN OF LEFT BREAST LUMPECTOMY;  Surgeon: Manus Rudd, MD;  Location: [facility] OR;  Service: General;  Laterality: Left;   TMJ ARTHROPLASTY     1979   TONSILLECTOMY     VIDEO BRONCHOSCOPY WITH ENDOBRONCHIAL NAVIGATION N/A 11/13/2015   Procedure: VIDEO BRONCHOSCOPY WITH ENDOBRONCHIAL NAVIGATION, with  placement of fudicial markers;  Surgeon: Delight Ovens, MD;  Location: [facility] OR;  Service: Thoracic;  Laterality: N/A;     Home Medications:  Prior to Admission medications   Medication Sig Start Date End Date Taking? Authorizing Provider  acetaminophen (TYLENOL) 500 MG tablet Take 1,000 mg by mouth 2 (two) times daily as needed (pain). Reported on 01/20/2016    [provider]  albuterol (VENTOLIN HFA) 108 (90 Base) MCG/ACT inhaler Inhale 2 puffs into the lungs 4 (four) times daily as needed for shortness of breath. 05/18/21   [provider]  amitriptyline (ELAVIL) 50 MG tablet Take 50 mg by mouth at bedtime.      [provider]  aspirin EC 81 MG tablet Take 1 tablet (81 mg total) by mouth daily. Swallow whole. 04/16/21   Baldo Daub, MD  atorvastatin (LIPITOR) 40 MG tablet Take 40 mg by mouth at bedtime.     [provider]  Calcium Citrate-Vitamin D (CALCIUM + D PO) Take 2 tablets by mouth 3 (three) times daily. Each tablet Calcium 300 mg, Vitamin D3 250 I.U.    [provider]  FARXIGA 10 MG TABS tablet TAKE 1 TABLET BY MOUTH DAILY BEFORE BREAKFAST. 10/02/22   Georgeanna Lea, MD  ketoconazole (NIZORAL) 2 % cream Apply 1 application topically daily. 06/04/20   Magrinat, Valentino Hue, MD  KLOR-CON M20 20 MEQ tablet TAKE 1 TABLET BY MOUTH TWICE A DAY 06/11/23   Georgeanna Lea, MD  Krill Oil 1000 MG CAPS Take 1,000 mg by mouth daily.    [provider]  levothyroxine (SYNTHROID, LEVOTHROID) 88 MCG tablet Take 88 mcg by mouth daily before breakfast.     [provider]  metFORMIN (GLUCOPHAGE) 500 MG tablet Take 500 mg by mouth daily. 09/17/15   [provider]  metoprolol succinate (TOPROL-XL) 50 MG 24 hr tablet Take 50 mg by mouth daily. 05/16/21   [provider]  nitroGLYCERIN (NITROSTAT) 0.4 MG SL tablet Place 1 tablet (0.4 mg total) under the tongue every 5 (five) minutes as needed for chest pain. Reported on 01/28/2016 05/29/21   Baldo Daub, MD  torsemide (DEMADEX) 20 MG tablet Take 2 tablets (40 mg total) by mouth 2 (two) times daily. 07/02/23   Sharlene Dory, PA-C    Inpatient Medications: Scheduled Meds:  amitriptyline  50 mg Oral QHS   aspirin EC  81 mg Oral Daily   atorvastatin  40 mg Oral QHS   insulin aspart  0-6 Units Subcutaneous TID WC   ipratropium-albuterol  3 mL Nebulization Q6H   levothyroxine  88 mcg Oral Q0600   sodium chloride flush  3 mL Intravenous Q12H   Continuous Infusions:  albumin human 60 mL/hr at 07/13/23 0600   ceFEPime (MAXIPIME) IV Stopped (07/13/23 0136)   [START ON 07/14/2023] vancomycin     PRN  Meds: acetaminophen, albuterol, hydrOXYzine  Allergies:   No Known Allergies  Social History:   Social History   Socioeconomic History   Marital status: Married    Spouse name: Not on file   Number of children: Not on file   Years of education: Not on file   Highest education level: Not on file  Occupational History   Not on file  Tobacco Use   Smoking status: Former    Current packs/day: 0.00    Types: Cigarettes    Quit date: 08/17/1994    Years since quitting: 28.9    Passive exposure:  Past   Smokeless tobacco: Never  Vaping Use   Vaping status: Never Used  Substance and Sexual Activity   Alcohol use: No   Drug use: No   Sexual activity: Not on file  Other Topics Concern   Not on file  Social History Narrative   Not on file   Social Determinants of Health   Financial Resource Strain: Not on file  Food Insecurity: Not on file  Transportation Needs: Not on file  Physical Activity: Not on file  Stress: Not on file  Social Connections: Not on file  Intimate Partner Violence: Not on file    Family History:    Family History  Problem Relation Age of Onset   Alzheimer's disease Mother    Heart attack Father    Breast cancer Neg Hx      ROS:  Please see the history of present illness.  All other ROS reviewed and negative.     Physical Exam/Data:   Vitals:   07/13/23 0430 07/13/23 0500 07/13/23 0530 07/13/23 0600  BP: 90/61 (!) 86/68 90/67 95/69   Pulse: (!) 117 (!) 116 (!) 117 (!) 117  Resp: 17 (!) 21 14 13   Temp:      TempSrc:      SpO2: 95% 94% 95% 95%  Weight:        Intake/Output Summary (Last 24 hours) at 07/13/2023 0630 Last data filed at 07/13/2023 0600 Gross per 24 hour  Intake 426.45 ml  Output 750 ml  Net -323.55 ml      07/13/2023    3:00 AM 07/12/2023    2:21 PM 07/02/2023    3:16 PM  Last 3 Weights  Weight (lbs) 148 lb 5.9 oz 148 lb 144 lb 3.2 oz  Weight (kg) 67.3 kg 67.132 kg 65.409 kg     Body mass index is 27.14 kg/m.   General:  Well nourished, well developed, in no acute distress HEENT: normal Neck: JVP elevation Vascular: No carotid bruits; Distal pulses 2+ bilaterally Cardiac:  normal S1, S2; RRR; no murmur  Lungs:  clear to auscultation bilaterally, no wheezing, rhonchi or rales  Abd: soft, nontender, no hepatomegaly  Ext: 3+ edema, anasarca Musculoskeletal:  No deformities, BUE and BLE strength normal and equal Skin: warm and dry  Neuro:  CNs 2-12 intact, no focal abnormalities noted Psych:  Normal affect   EKG:  The EKG was personally reviewed and demonstrates:  AFL 2:1 Telemetry:  Telemetry was personally reviewed and demonstrates:  AFL  Relevant CV Studies:  TTE 08/24/22: 1. GLS -15.3. Left ventricular ejection fraction, by estimation, is 60 to 65%. Left ventricular ejection fraction by 3D volume is 65 %. The left ventricle has normal function. Left ventricular endocardial border not optimally defined to evaluate regional wall motion. Left ventricular diastolic parameters are indeterminate. 2. Right ventricular systolic function is normal. The right ventricular size is mildly enlarged.  3. Left atrial size was mildly dilated.  4. The mitral valve is normal in structure. Trivial mitral valve regurgitation. No evidence of mitral stenosis. 5. The aortic valve is normal in structure. Aortic valve regurgitation is not visualized. No aortic stenosis is present.  6. The inferior vena cava is normal in size with greater than 50% respiratory variability, suggesting right atrial pressure of 3 mmHg.   Laboratory Data:  High Sensitivity Troponin:   Recent Labs  Lab 07/12/23 1434 07/12/23 2059  TROPONINIHS 59* 57*     Chemistry Recent Labs  Lab 07/09/23 1131 07/12/23 1434 07/13/23  0341  NA 135 130* 131*  K CANCELED 5.7* 4.7  CL 94* 94* 96*  CO2 23 28 24   GLUCOSE CANCELED 85 109*  BUN 50* 59* 49*  CREATININE 1.74* 1.52* 1.33*  CALCIUM 9.3 9.2 8.0*  MG  --   --  2.0  GFRNONAA  --  35* 41*   ANIONGAP  --  8 11    Recent Labs  Lab 07/12/23 1500  PROT 6.8  ALBUMIN 3.9  AST 38  ALT 33  ALKPHOS 102  BILITOT 1.0   Lipids  Recent Labs  Lab 07/13/23 0341  CHOL 91  TRIG 104  HDL 28*  LDLCALC 42  CHOLHDL 3.3    Hematology Recent Labs  Lab 07/12/23 1434 07/13/23 0038 07/13/23 0341  WBC 10.3  --  8.4  RBC 6.06*  --  5.69*  HGB 17.6* 17.4* 16.3*  HCT 55.0* 54.6* 51.3*  MCV 90.8  --  90.2  MCH 29.0  --  28.6  MCHC 32.0  --  31.8  RDW 17.8*  --  16.7*  PLT 124*  --  108*   Thyroid  Recent Labs  Lab 07/13/23 0038 07/13/23 0341  TSH 0.321*  --   FREET4  --  0.97    BNP Recent Labs  Lab 07/09/23 1131 07/12/23 1434 07/13/23 0038  BNP  --  2,311.4* 2,289.1*  PROBNP 33,547*  --   --     DDimer No results for input(s): "DDIMER" in the last 168 hours.   Radiology/Studies:  CT CHEST ABDOMEN PELVIS WO CONTRAST  Result Date: 07/12/2023 CLINICAL DATA:  Poly trauma, penetrating. Shortness of breath. Syncope. History of breast cancer. EXAM: CT CHEST, ABDOMEN AND PELVIS WITHOUT CONTRAST TECHNIQUE: Multidetector CT imaging of the chest, abdomen and pelvis was performed following the standard protocol without IV contrast. RADIATION DOSE REDUCTION: This exam was performed according to the departmental dose-optimization program which includes automated exposure control, adjustment of the mA and/or kV according to patient size and/or use of iterative reconstruction technique. COMPARISON:  PET-CT 06/10/2021.  Chest CT 12/24/2020. FINDINGS: CT CHEST FINDINGS Cardiovascular: Diffuse atherosclerosis of the aorta, great vessels and coronary arteries status post median sternotomy and CABG. Mild central enlargement of the pulmonary arteries. The heart size is normal. There is no pericardial effusion. Mediastinum/Nodes: There are no enlarged mediastinal, hilar, axillary or internal mammary lymph nodes. Surgical clips are present within the left axilla. Hilar assessment is limited  by the lack of intravenous contrast, although the hilar contours appear unchanged. The thyroid gland, trachea and esophagus demonstrate no significant findings. Lungs/Pleura: Trace left pleural effusion with dependent left lower lobe atelectasis. No evidence of pneumothorax. Stable probable radiation changes anteriorly in the right upper lobe adjacent to the fiducial markers. No new or enlarging pulmonary nodules identified. Mild underlying centrilobular emphysema. Musculoskeletal/Chest wall: No chest wall mass or suspicious osseous findings. Stable old fracture of the right 2nd rib anteriorly. Previous median sternotomy. There are degenerative changes in the spine associated with a convex right thoracic scoliosis. Postsurgical changes in the left breast and left axilla. CT ABDOMEN AND PELVIS FINDINGS Hepatobiliary: No focal hepatic abnormalities are identified on noncontrast imaging. There is a small gallstone. The gallbladder appears mildly distended without significant wall thickening. No significant biliary dilatation. Pancreas: Atrophy. No focal abnormality, ductal dilatation or surrounding inflammation. Spleen: No evidence of acute splenic injury. The spleen is normal in size without focal abnormality. Adrenals/Urinary Tract: Stable low-density adrenal adenomas bilaterally; no specific follow-up imaging recommended. No evidence  of urinary tract calculus, suspicious renal lesion or hydronephrosis. The bladder appears normal for its degree of distention. Stomach/Bowel: No enteric contrast administered. The stomach appears unremarkable for its degree of distension. No evidence of bowel wall thickening, distention or surrounding inflammatory change. Vascular/Lymphatic: There are no enlarged abdominal or pelvic lymph nodes. Diffuse aortic and branch vessel atherosclerosis without evidence of aneurysm. Reproductive: Evidence of prior hysterectomy. No suspicious adnexal findings. Other: New moderate amount of  low-density ascites without evidence of sentinel clot, dependent high density components or pneumoperitoneum. Intact abdominal wall. There is generalized edema throughout the subcutaneous and intra-abdominal fat. Chronic subcutaneous calcifications in the buttocks bilaterally. Musculoskeletal: No evidence of acute fracture or dislocation. IMPRESSION: 1. No evidence of acute traumatic injury within the chest, abdomen or pelvis. 2. New moderate amount of nonspecific low-density ascites without evidence of sentinel clot, dependent high density components or pneumoperitoneum. Generalized soft tissue edema. 3. Trace left pleural effusion with dependent left lower lobe atelectasis. 4. Stable probable radiation changes anteriorly in the right upper lobe adjacent to the fiducial markers. No new or enlarging pulmonary nodules identified. 5. Cholelithiasis with mild gallbladder distention but no significant wall thickening or biliary dilatation. 6. Stable bilateral adrenal adenomas. 7. Aortic Atherosclerosis (ICD10-I70.0) and Emphysema (ICD10-J43.9). Electronically Signed   By: Carey Bullocks M.D.   On: 07/12/2023 17:07   CT Head Wo Contrast  Result Date: 07/12/2023 CLINICAL DATA:  Polytrauma, blunt. EXAM: CT HEAD WITHOUT CONTRAST CT CERVICAL SPINE WITHOUT CONTRAST TECHNIQUE: Multidetector CT imaging of the head and cervical spine was performed following the standard protocol without intravenous contrast. Multiplanar CT image reconstructions of the cervical spine were also generated. RADIATION DOSE REDUCTION: This exam was performed according to the departmental dose-optimization program which includes automated exposure control, adjustment of the mA and/or kV according to patient size and/or use of iterative reconstruction technique. COMPARISON:  Head CT 10/22/2015. FINDINGS: CT HEAD FINDINGS Brain: No acute intracranial hemorrhage. Gray-white differentiation is preserved. No hydrocephalus or extra-axial collection. No  mass effect or midline shift. Vascular: No hyperdense vessel or unexpected calcification. Skull: No calvarial fracture or suspicious bone lesion. Skull base is unremarkable. Sinuses/Orbits: No acute finding. Other: None. CT CERVICAL SPINE FINDINGS Alignment: No traumatic malalignment. Skull base and vertebrae: No acute fracture. Normal craniocervical junction. No suspicious bone lesions. Soft tissues and spinal canal: No prevertebral fluid or swelling. No visible canal hematoma. Disc levels: Multilevel cervical spondylosis, worst at C3-4, where there is at least mild spinal canal stenosis. Upper chest: No acute findings. Other: Atherosclerotic calcifications of the carotid bulbs. IMPRESSION: 1. No acute intracranial abnormality. 2. No acute cervical spine fracture or traumatic malalignment. 3. Multilevel cervical spondylosis, worst at C3-4, where there is at least mild spinal canal stenosis. Electronically Signed   By: Orvan Falconer M.D.   On: 07/12/2023 16:29   CT Cervical Spine Wo Contrast  Result Date: 07/12/2023 CLINICAL DATA:  Polytrauma, blunt. EXAM: CT HEAD WITHOUT CONTRAST CT CERVICAL SPINE WITHOUT CONTRAST TECHNIQUE: Multidetector CT imaging of the head and cervical spine was performed following the standard protocol without intravenous contrast. Multiplanar CT image reconstructions of the cervical spine were also generated. RADIATION DOSE REDUCTION: This exam was performed according to the departmental dose-optimization program which includes automated exposure control, adjustment of the mA and/or kV according to patient size and/or use of iterative reconstruction technique. COMPARISON:  Head CT 10/22/2015. FINDINGS: CT HEAD FINDINGS Brain: No acute intracranial hemorrhage. Gray-white differentiation is preserved. No hydrocephalus or extra-axial collection. No mass effect  or midline shift. Vascular: No hyperdense vessel or unexpected calcification. Skull: No calvarial fracture or suspicious bone  lesion. Skull base is unremarkable. Sinuses/Orbits: No acute finding. Other: None. CT CERVICAL SPINE FINDINGS Alignment: No traumatic malalignment. Skull base and vertebrae: No acute fracture. Normal craniocervical junction. No suspicious bone lesions. Soft tissues and spinal canal: No prevertebral fluid or swelling. No visible canal hematoma. Disc levels: Multilevel cervical spondylosis, worst at C3-4, where there is at least mild spinal canal stenosis. Upper chest: No acute findings. Other: Atherosclerotic calcifications of the carotid bulbs. IMPRESSION: 1. No acute intracranial abnormality. 2. No acute cervical spine fracture or traumatic malalignment. 3. Multilevel cervical spondylosis, worst at C3-4, where there is at least mild spinal canal stenosis. Electronically Signed   By: Orvan Falconer M.D.   On: 07/12/2023 16:29   DG Chest Port 1 View  Result Date: 07/12/2023 CLINICAL DATA:  Shortness of breath.  Syncope. EXAM: PORTABLE CHEST 1 VIEW COMPARISON:  January 27, 2023. FINDINGS: Stable cardiomediastinal silhouette. Status post coronary artery bypass graft. Minimal left basilar subsegmental atelectasis is noted with small pleural effusion. No definite acute abnormality seen in the right lung. IMPRESSION: Minimal left basilar subsegmental atelectasis with small left pleural effusion. Electronically Signed   By: Lupita Raider M.D.   On: 07/12/2023 15:50     Assessment and Plan:   Acute Decompensated Heart Failure: previous TTE w/ preserved EF. - Gentle diuresis given relative hypotension- spot dose lasix 40mg  IV - obtain TTE - Avoid midodrine to avoid increasing afterload  Relative Hypotension: Lact nml, LLE tepid, but RLE warm (?celluliis). - suspect hypotension due to low PO intake vs infection. Given normal lactate, low suspicion for cardiogenic shock.  AFL 2:1: no hx of AF/AFL - AC if able to (primary team avoiding due to hemoperitoneum) - rate control w/ metoprolol  CAD: s/p remote  CABG HLD - aspirin - statin  Risk Assessment/Risk Scores:        New York Heart Association (NYHA) Functional Class NYHA Class III        For questions or updates, please contact [facility] Please consult [email or web address] for contact info under    Signed, Waycross Desanctis, MD  07/13/2023 6:30 AM

## 2023-07-13 NOTE — Progress Notes (Addendum)
Update:   Remains with intermittent hypotension and borderline BP overnight. Having good urine output with diuresis. Blood cultures were drawn and started on broad Abx with Vancomycin, Cefepime. I spoke with cardiology fellow Dr. Laurelyn Sickle re: possible need for ionotropes. He recommends obtaining TTE first due to unknown EF. Recommends against midodrine considering our concern for possible low output HF. If pressures are to worsen recommends PCCM evaluation for levophed. PCCM consulted.   Spoke with Dr. Tonia Brooms from PCCM recommends for Albumin assisted diuresis with 25 g q6 hr, redosing of lasix. OK to remain on progressive for now. If patient to worsen please contact PCCM for possible escalation to ICU.   Dolly Rias, MD  [facility]

## 2023-07-13 NOTE — Progress Notes (Addendum)
Progress Note  Patient Name: Denise Mckenzie Date of Encounter: 07/13/2023  [facility] Cardiologist: None   Patient Profile     Subjective   Denies any chest pain or shortness of breath..  Needs in atrial flutter with RVR  Inpatient Medications    Scheduled Meds:  amitriptyline  50 mg Oral QHS   aspirin EC  81 mg Oral Daily   atorvastatin  40 mg Oral QHS   insulin aspart  0-6 Units Subcutaneous TID WC   ipratropium-albuterol  3 mL Nebulization Q6H   levothyroxine  88 mcg Oral Q0600   sodium chloride flush  3 mL Intravenous Q12H   Continuous Infusions:  albumin human 25 g (07/13/23 1225)   ceFEPime (MAXIPIME) IV Stopped (07/13/23 0136)   [START ON 07/14/2023] vancomycin     PRN Meds: acetaminophen, albuterol, hydrOXYzine   Vital Signs    Vitals:   07/13/23 0829 07/13/23 0930 07/13/23 1000 07/13/23 1135  BP: 94/65 (!) 86/66 (!) 88/66 (!) 88/67  Pulse: (!) 119 (!) 119  (!) 122  Resp: 19   15  Temp:    (!) 97.5 F (36.4 C)  TempSrc:    Oral  SpO2: 98% 98%  92%  Weight:        Intake/Output Summary (Last 24 hours) at 07/13/2023 1233 Last data filed at 07/13/2023 0600 Gross per 24 hour  Intake 426.45 ml  Output 750 ml  Net -323.55 ml      07/13/2023    3:00 AM 07/12/2023    2:21 PM 07/02/2023    3:16 PM  Last 3 Weights  Weight (lbs) 148 lb 5.9 oz 148 lb 144 lb 3.2 oz  Weight (kg) 67.3 kg 67.132 kg 65.409 kg      Telemetry    Atrial flutter with RVR to 120 bpm- Personally Reviewed  ECG    No new EKG to review- Personally Reviewed  Physical Exam   GEN: No acute distress.   Neck: No JVD Cardiac: Tachycardic, no murmurs, rubs, or gallops.  Respiratory: Clear to auscultation bilaterally. GI: Soft, nontender, non-distended  MS: 3+ bilateral lower extremity edema more so in the feet with erythema of her lower extremities and increased warmth of the right lower extremity; No deformity. Neuro:  Nonfocal  Psych: Normal affect   Labs    High  Sensitivity Troponin:   Recent Labs  Lab 07/12/23 1434 07/12/23 2059  TROPONINIHS 59* 57*      Chemistry Recent Labs  Lab 07/09/23 1131 07/12/23 1434 07/12/23 1500 07/13/23 0341  NA 135 130*  --  131*  K CANCELED 5.7*  --  4.7  CL 94* 94*  --  96*  CO2 23 28  --  24  GLUCOSE CANCELED 85  --  109*  BUN 50* 59*  --  49*  CREATININE 1.74* 1.52*  --  1.33*  CALCIUM 9.3 9.2  --  8.0*  PROT  --   --  6.8  --   ALBUMIN  --   --  3.9  --   AST  --   --  38  --   ALT  --   --  33  --   ALKPHOS  --   --  102  --   BILITOT  --   --  1.0  --   GFRNONAA  --  35*  --  41*  ANIONGAP  --  8  --  11     Hematology Recent Labs  Lab 07/12/23 1434 07/13/23 0038 07/13/23 0341  WBC 10.3  --  8.4  RBC 6.06*  --  5.69*  HGB 17.6* 17.4* 16.3*  HCT 55.0* 54.6* 51.3*  MCV 90.8  --  90.2  MCH 29.0  --  28.6  MCHC 32.0  --  31.8  RDW 17.8*  --  16.7*  PLT 124*  --  108*    BNP Recent Labs  Lab 07/09/23 1131 07/12/23 1434 07/13/23 0038  BNP  --  2,311.4* 2,289.1*  PROBNP 33,547*  --   --      DDimer No results for input(s): "DDIMER" in the last 168 hours.    Radiology    IR ABDOMEN US LIMITED  Result Date: 07/13/2023 INDICATION: Abdominal distention, fluid overload. EXAM: LIMITED ABDOMEN ULTRASOUND FOR ASCITES TECHNIQUE: Limited ultrasound survey for ascites was performed in all four abdominal quadrants. COMPARISON:  CT ABDOMEN PELVIS WO CONTRAST FINDINGS: No significant pocket of fluid or percutaneous window to allow safe paracentesis. Risks outweigh the benefits. IMPRESSION: Trace ascites with no safe window for percutaneous access. No procedure performed. Performed by: Loyce Dys PA-C Marliss Coots, MD [facility] Electronically Signed   By: Marliss Coots M.D.   On: 07/13/2023 11:40   VAS [place] LOWER EXTREMITY VENOUS (DVT)  Result Date: 07/13/2023  Lower Venous DVT Study Patient Name:  Denise Mckenzie  Date of  Exam:   07/13/2023 Medical Rec #: [id number]     Accession #:    [id number] Date of Birth: 1945/08/30     Patient Gender: F Patient Age:   77 years Exam Location:  [facility] Procedure:      VAS [place] LOWER EXTREMITY VENOUS (DVT) Referring Phys: Dolly Rias --------------------------------------------------------------------------------  Indications: Swelling, and Edema.  Risk Factors: Recent extended travel. Limitations: Poor ultrasound/tissue interface. Comparison Study: No prior study Performing Technologist: Shona Simpson  Examination Guidelines: A complete evaluation includes B-mode imaging, spectral Doppler, color Doppler, and power Doppler as needed of all accessible portions of each vessel. Bilateral testing is considered an integral part of a complete examination. Limited examinations for reoccurring indications may be performed as noted. The reflux portion of the exam is performed with the patient in reverse Trendelenburg.  +---------+---------------+---------+-----------+----------+--------------+ RIGHT    CompressibilityPhasicitySpontaneityPropertiesThrombus Aging +---------+---------------+---------+-----------+----------+--------------+ CFV      Full           No       Yes        Pulsatile                +---------+---------------+---------+-----------+----------+--------------+ SFJ      Full                                                        +---------+---------------+---------+-----------+----------+--------------+ FV Prox  Full                                                        +---------+---------------+---------+-----------+----------+--------------+ FV Mid   Full                                                        +---------+---------------+---------+-----------+----------+--------------+  FV DistalFull                                                        +---------+---------------+---------+-----------+----------+--------------+ PFV       Full                                                        +---------+---------------+---------+-----------+----------+--------------+ POP      Full           No       Yes        Pulsatile                +---------+---------------+---------+-----------+----------+--------------+ PTV      Full                                                        +---------+---------------+---------+-----------+----------+--------------+ PERO     Full                                                        +---------+---------------+---------+-----------+----------+--------------+   +----+---------------+---------+-----------+----------+--------------+ LEFTCompressibilityPhasicitySpontaneityPropertiesThrombus Aging +----+---------------+---------+-----------+----------+--------------+ CFV Full           No       Yes        Pulsatile                +----+---------------+---------+-----------+----------+--------------+    Summary: RIGHT: - There is no evidence of deep vein thrombosis in the lower extremity.  - No cystic structure found in the popliteal fossa.  LEFT: - No evidence of common femoral vein obstruction.   *See table(s) above for measurements and observations.    Preliminary    ECHOCARDIOGRAM COMPLETE  Result Date: 07/13/2023    ECHOCARDIOGRAM REPORT   Patient Name:   Denise Mckenzie Zandi Date of Exam: 07/13/2023 Medical Rec #:  [id number]    Height:       62.0 in Accession #:    [id number]   Weight:       148.4 lb Date of Birth:  18-Apr-1946    BSA:          1.684 m Patient Age:    4 years     BP:           91/63 mmHg Patient Gender: F            HR:           122 bpm. Exam Location:  Inpatient Procedure: 2D Echo, Cardiac Doppler and Color Doppler Indications:    Congestive Heart Failure  History:        Patient has prior history of Echocardiogram examinations, most                 recent 08/24/2022. COPD; Risk Factors:Hypertension, Dyslipidemia,  Diabetes and Former  Smoker.  Sonographer:    Karma Ganja Referring Phys: [id number] Dolly Rias  Sonographer Comments: Image acquisition challenging due to uncooperative patient, Image acquisition challenging due to COPD and Image acquisition challenging due to respiratory motion. IMPRESSIONS  1. Left ventricular ejection fraction, by estimation, is 60 to 65%. The left ventricle has normal function. The left ventricle has no regional wall motion abnormalities. Left ventricular diastolic parameters are consistent with Grade I diastolic dysfunction (impaired relaxation). There is the interventricular septum is flattened in systole and diastole, consistent with right ventricular pressure and volume overload.  2. Right ventricular systolic function is severely reduced. The right ventricular size is moderately enlarged. There is moderately elevated pulmonary artery systolic pressure. The estimated right ventricular systolic pressure is 45.9 mmHg.  3. Right atrial size was severely dilated.  4. The mitral valve is normal in structure. Trivial mitral valve regurgitation.  5. The aortic valve is normal in structure. There is mild calcification of the aortic valve. Aortic valve regurgitation is not visualized.  6. The inferior vena cava is dilated in size with <50% respiratory variability, suggesting right atrial pressure of 15 mmHg. Comparison(s): Changes from prior study are noted. New severe RV dilation and dysfunction. FINDINGS  Left Ventricle: Left ventricular ejection fraction, by estimation, is 60 to 65%. The left ventricle has normal function. The left ventricle has no regional wall motion abnormalities. The left ventricular internal cavity size was small. There is no left ventricular hypertrophy. The interventricular septum is flattened in systole and diastole, consistent with right ventricular pressure and volume overload. Left ventricular diastolic parameters are consistent with Grade I diastolic dysfunction (impaired relaxation).  Right Ventricle: The right ventricular size is moderately enlarged. No increase in right ventricular wall thickness. Right ventricular systolic function is severely reduced. There is moderately elevated pulmonary artery systolic pressure. The tricuspid regurgitant velocity is 2.78 m/s, and with an assumed right atrial pressure of 15 mmHg, the estimated right ventricular systolic pressure is 45.9 mmHg. Left Atrium: Left atrial size was normal in size. Right Atrium: Right atrial size was severely dilated. Pericardium: There is no evidence of pericardial effusion. Mitral Valve: The mitral valve is normal in structure. Trivial mitral valve regurgitation. Tricuspid Valve: The tricuspid valve is normal in structure. Tricuspid valve regurgitation is mild. Aortic Valve: The aortic valve is normal in structure. There is mild calcification of the aortic valve. Aortic valve regurgitation is not visualized. Aortic valve mean gradient measures 1.0 mmHg. Aortic valve peak gradient measures 2.9 mmHg. Aortic valve  area, by VTI measures 2.41 cm. Pulmonic Valve: The pulmonic valve was grossly normal. Pulmonic valve regurgitation is trivial. Aorta: The aortic root and ascending aorta are structurally normal, with no evidence of dilitation. Venous: The inferior vena cava is dilated in size with less than 50% respiratory variability, suggesting right atrial pressure of 15 mmHg. IAS/Shunts: No atrial level shunt detected by color flow Doppler.  LEFT VENTRICLE PLAX 2D LVIDd:         3.30 cm     Diastology LVIDs:         2.30 cm     LV e' medial:    5.11 cm/s LV PW:         1.20 cm     LV E/e' medial:  16.9 LV IVS:        1.10 cm     LV e' lateral:   13.40 cm/s LVOT diam:     1.90 cm  LV E/e' lateral: 6.5 LV SV:         24 LV SV Index:   14 LVOT Area:     2.84 cm  LV Volumes (MOD) LV vol d, MOD A2C: 15.6 ml LV vol d, MOD A4C: 36.0 ml LV vol s, MOD A2C: 9.8 ml LV vol s, MOD A4C: 15.0 ml LV SV MOD A2C:     5.8 ml LV SV MOD A4C:      36.0 ml LV SV MOD BP:      11.4 ml RIGHT VENTRICLE            IVC RV S prime:     8.16 cm/s  IVC diam: 2.40 cm TAPSE (M-mode): 0.7 cm LEFT ATRIUM             Index        RIGHT ATRIUM           Index LA diam:        4.20 cm 2.49 cm/m   RA Area:     22.10 cm LA Vol (A2C):   36.6 ml 21.74 ml/m  RA Volume:   68.80 ml  40.86 ml/m LA Vol (A4C):   16.3 ml 9.68 ml/m LA Biplane Vol: 23.7 ml 14.07 ml/m  AORTIC VALVE AV Area (Vmax):    2.31 cm AV Area (Vmean):   2.22 cm AV Area (VTI):     2.41 cm AV Vmax:           85.50 cm/s AV Vmean:          54.300 cm/s AV VTI:            0.100 m AV Peak Grad:      2.9 mmHg AV Mean Grad:      1.0 mmHg LVOT Vmax:         69.70 cm/s LVOT Vmean:        42.600 cm/s LVOT VTI:          0.085 m LVOT/AV VTI ratio: 0.85  AORTA Ao Root diam: 3.40 cm MITRAL VALVE               TRICUSPID VALVE MV Area (PHT): 9.37 cm    TR Peak grad:   30.9 mmHg MV Decel Time: 81 msec     TR Vmax:        278.00 cm/s MV E velocity: 86.60 cm/s MV A velocity: 39.90 cm/s  SHUNTS MV E/A ratio:  2.17        Systemic VTI:  0.08 m                            Systemic Diam: 1.90 cm Clearnce Hasten Electronically signed by Clearnce Hasten Signature Date/Time: 07/13/2023/11:19:05 AM    Final    US LIVER DOPPLER  Result Date: 07/13/2023 CLINICAL DATA:  Ascites.  Portal hypertension. EXAM: DUPLEX ULTRASOUND OF LIVER TECHNIQUE: Color and duplex Doppler ultrasound was performed to evaluate the hepatic in-flow and out-flow vessels. COMPARISON:  CT 07/12/2023 FINDINGS: Liver: Liver may have a slightly nodular contour. Normal echogenicity of the liver. No discrete liver lesion. Small amount of perihepatic ascites. Calcified gallstone measuring 1.7 cm with posterior acoustic shadowing. Small amount of fluid around the gallbladder. Mild gallbladder wall thickening. Main Portal Vein size: 1.1 cm Portal Vein Velocities Main Prox:  23 cm/sec Main Mid: 23 cm/sec Main Dist:  25 cm/sec Right: 23 cm/sec Left: 22 cm/sec Hepatic Vein  Velocities  Right:  18 cm/sec Middle:  30 cm/sec Left:  20 cm/sec IVC: Present and patent with normal respiratory phasicity. Hepatic Artery Velocity: Not visualized Splenic Vein Velocity:  10 cm/sec Spleen: 7.4 cm x 7.7 cm x 3.1 cm with a total volume of 90 cm^3 (411 cm^3 is upper limit normal) Portal Vein Occlusion/Thrombus: No Splenic Vein Occlusion/Thrombus: No Ascites: Present Varices: None Normal hepatopetal flow in the portal veins. Normal hepatofugal flow in the hepatic veins. Small amount of fluid around the liver and spleen. Small amount of fluid in lower quadrants. IMPRESSION: 1. Patent portal veins with normal direction of flow. 2. Small amount of ascites. 3. Cholelithiasis. Mild gallbladder wall thickening is nonspecific and could be related to the ascites. Recommend clinical correlation with regards to acute cholecystitis. 4. Liver may have a slightly nodular contour. These findings are equivocal. Electronically Signed   By: Richarda Overlie M.D.   On: 07/13/2023 07:47   CT CHEST ABDOMEN PELVIS WO CONTRAST  Result Date: 07/12/2023 CLINICAL DATA:  Poly trauma, penetrating. Shortness of breath. Syncope. History of breast cancer. EXAM: CT CHEST, ABDOMEN AND PELVIS WITHOUT CONTRAST TECHNIQUE: Multidetector CT imaging of the chest, abdomen and pelvis was performed following the standard protocol without IV contrast. RADIATION DOSE REDUCTION: This exam was performed according to the departmental dose-optimization program which includes automated exposure control, adjustment of the mA and/or kV according to patient size and/or use of iterative reconstruction technique. COMPARISON:  PET-CT 06/10/2021.  Chest CT 12/24/2020. FINDINGS: CT CHEST FINDINGS Cardiovascular: Diffuse atherosclerosis of the aorta, great vessels and coronary arteries status post median sternotomy and CABG. Mild central enlargement of the pulmonary arteries. The heart size is normal. There is no pericardial effusion. Mediastinum/Nodes: There  are no enlarged mediastinal, hilar, axillary or internal mammary lymph nodes. Surgical clips are present within the left axilla. Hilar assessment is limited by the lack of intravenous contrast, although the hilar contours appear unchanged. The thyroid gland, trachea and esophagus demonstrate no significant findings. Lungs/Pleura: Trace left pleural effusion with dependent left lower lobe atelectasis. No evidence of pneumothorax. Stable probable radiation changes anteriorly in the right upper lobe adjacent to the fiducial markers. No new or enlarging pulmonary nodules identified. Mild underlying centrilobular emphysema. Musculoskeletal/Chest wall: No chest wall mass or suspicious osseous findings. Stable old fracture of the right 2nd rib anteriorly. Previous median sternotomy. There are degenerative changes in the spine associated with a convex right thoracic scoliosis. Postsurgical changes in the left breast and left axilla. CT ABDOMEN AND PELVIS FINDINGS Hepatobiliary: No focal hepatic abnormalities are identified on noncontrast imaging. There is a small gallstone. The gallbladder appears mildly distended without significant wall thickening. No significant biliary dilatation. Pancreas: Atrophy. No focal abnormality, ductal dilatation or surrounding inflammation. Spleen: No evidence of acute splenic injury. The spleen is normal in size without focal abnormality. Adrenals/Urinary Tract: Stable low-density adrenal adenomas bilaterally; no specific follow-up imaging recommended. No evidence of urinary tract calculus, suspicious renal lesion or hydronephrosis. The bladder appears normal for its degree of distention. Stomach/Bowel: No enteric contrast administered. The stomach appears unremarkable for its degree of distension. No evidence of bowel wall thickening, distention or surrounding inflammatory change. Vascular/Lymphatic: There are no enlarged abdominal or pelvic lymph nodes. Diffuse aortic and branch vessel  atherosclerosis without evidence of aneurysm. Reproductive: Evidence of prior hysterectomy. No suspicious adnexal findings. Other: New moderate amount of low-density ascites without evidence of sentinel clot, dependent high density components or pneumoperitoneum. Intact abdominal wall. There is generalized edema throughout the subcutaneous  and intra-abdominal fat. Chronic subcutaneous calcifications in the buttocks bilaterally. Musculoskeletal: No evidence of acute fracture or dislocation. IMPRESSION: 1. No evidence of acute traumatic injury within the chest, abdomen or pelvis. 2. New moderate amount of nonspecific low-density ascites without evidence of sentinel clot, dependent high density components or pneumoperitoneum. Generalized soft tissue edema. 3. Trace left pleural effusion with dependent left lower lobe atelectasis. 4. Stable probable radiation changes anteriorly in the right upper lobe adjacent to the fiducial markers. No new or enlarging pulmonary nodules identified. 5. Cholelithiasis with mild gallbladder distention but no significant wall thickening or biliary dilatation. 6. Stable bilateral adrenal adenomas. 7. Aortic Atherosclerosis (ICD10-I70.0) and Emphysema (ICD10-J43.9). Electronically Signed   By: Carey Bullocks M.D.   On: 07/12/2023 17:07   CT Head Wo Contrast  Result Date: 07/12/2023 CLINICAL DATA:  Polytrauma, blunt. EXAM: CT HEAD WITHOUT CONTRAST CT CERVICAL SPINE WITHOUT CONTRAST TECHNIQUE: Multidetector CT imaging of the head and cervical spine was performed following the standard protocol without intravenous contrast. Multiplanar CT image reconstructions of the cervical spine were also generated. RADIATION DOSE REDUCTION: This exam was performed according to the departmental dose-optimization program which includes automated exposure control, adjustment of the mA and/or kV according to patient size and/or use of iterative reconstruction technique. COMPARISON:  Head CT 10/22/2015.  FINDINGS: CT HEAD FINDINGS Brain: No acute intracranial hemorrhage. Gray-white differentiation is preserved. No hydrocephalus or extra-axial collection. No mass effect or midline shift. Vascular: No hyperdense vessel or unexpected calcification. Skull: No calvarial fracture or suspicious bone lesion. Skull base is unremarkable. Sinuses/Orbits: No acute finding. Other: None. CT CERVICAL SPINE FINDINGS Alignment: No traumatic malalignment. Skull base and vertebrae: No acute fracture. Normal craniocervical junction. No suspicious bone lesions. Soft tissues and spinal canal: No prevertebral fluid or swelling. No visible canal hematoma. Disc levels: Multilevel cervical spondylosis, worst at C3-4, where there is at least mild spinal canal stenosis. Upper chest: No acute findings. Other: Atherosclerotic calcifications of the carotid bulbs. IMPRESSION: 1. No acute intracranial abnormality. 2. No acute cervical spine fracture or traumatic malalignment. 3. Multilevel cervical spondylosis, worst at C3-4, where there is at least mild spinal canal stenosis. Electronically Signed   By: Orvan Falconer M.D.   On: 07/12/2023 16:29   CT Cervical Spine Wo Contrast  Result Date: 07/12/2023 CLINICAL DATA:  Polytrauma, blunt. EXAM: CT HEAD WITHOUT CONTRAST CT CERVICAL SPINE WITHOUT CONTRAST TECHNIQUE: Multidetector CT imaging of the head and cervical spine was performed following the standard protocol without intravenous contrast. Multiplanar CT image reconstructions of the cervical spine were also generated. RADIATION DOSE REDUCTION: This exam was performed according to the departmental dose-optimization program which includes automated exposure control, adjustment of the mA and/or kV according to patient size and/or use of iterative reconstruction technique. COMPARISON:  Head CT 10/22/2015. FINDINGS: CT HEAD FINDINGS Brain: No acute intracranial hemorrhage. Gray-white differentiation is preserved. No hydrocephalus or extra-axial  collection. No mass effect or midline shift. Vascular: No hyperdense vessel or unexpected calcification. Skull: No calvarial fracture or suspicious bone lesion. Skull base is unremarkable. Sinuses/Orbits: No acute finding. Other: None. CT CERVICAL SPINE FINDINGS Alignment: No traumatic malalignment. Skull base and vertebrae: No acute fracture. Normal craniocervical junction. No suspicious bone lesions. Soft tissues and spinal canal: No prevertebral fluid or swelling. No visible canal hematoma. Disc levels: Multilevel cervical spondylosis, worst at C3-4, where there is at least mild spinal canal stenosis. Upper chest: No acute findings. Other: Atherosclerotic calcifications of the carotid bulbs. IMPRESSION: 1. No acute intracranial abnormality. 2.  No acute cervical spine fracture or traumatic malalignment. 3. Multilevel cervical spondylosis, worst at C3-4, where there is at least mild spinal canal stenosis. Electronically Signed   By: Orvan Falconer M.D.   On: 07/12/2023 16:29   DG Chest Port 1 View  Result Date: 07/12/2023 CLINICAL DATA:  Shortness of breath.  Syncope. EXAM: PORTABLE CHEST 1 VIEW COMPARISON:  January 27, 2023. FINDINGS: Stable cardiomediastinal silhouette. Status post coronary artery bypass graft. Minimal left basilar subsegmental atelectasis is noted with small pleural effusion. No definite acute abnormality seen in the right lung. IMPRESSION: Minimal left basilar subsegmental atelectasis with small left pleural effusion. Electronically Signed   By: Lupita Raider M.D.   On: 07/12/2023 15:50    Patient Profile     78 y.o. female  with a hx of CAD (CABG 1996), COPD, HTN, HLD, Breast CA, Lung CA; who is being seen 07/13/2023 for the evaluation of syncope and volume overload at the request of Dolly Rias MD.   Assessment & Plan    Acute Decompensated HFpEF:  -previous TTE w/ preserved EF 60 to 65% and normal RV function -2D echo this admit shows EF 60 to 65% with G1 DD and IVS  flattening in systole and diastole consistent with RV pressure and volume overload -2D echo also shows new severe RV systolic dysfunction with moderate RVE and moderate pulmonary hypertension PASP 45 mmHg with severe right atrial enlargement right atrial pressure estimate 15 mmHg -Chest CT shows new ascites, trace left pleural effusion and left lower lobe atelectasis but was not contrasted so could not rule out PE -In light of her new acute RV systolic dysfunction as well as severe RV pressure overload and pulmonary hypertension in the setting of atrial arrhythmias with tachycardia and hypotension as well as syncope prior to admission while sitting in a chair, need to consider acute PE -If no evidence of PE on chest CTA then will need right heart cath -BNP is elevated at 2500 but this can be also seen in PE with acute right ventricular pressure overload -Hold any further diuretics for now given hypotension -Will get stat chest CTA to rule out PE   Relative Hypotension: Lact nml, LLE tepid, but RLE warm (?celluliis). - suspect hypotension due to low PO intake, tachyarrhythmias and infection and also possible acute PE  AFL 2:1: no hx of AF/AFL -Currently in atrial flutter with 2-1 block at 130 bpm -Unclear duration of new onset a flutter -Hypotension precludes use of CCB or beta-blocker at this time. -She is at high risk of conversion to sinus rhythm and possible cardioembolic event if we add amiodarone but at this time may not have any other options.  Will wait to see what her chest CTA shows -has been started on full dose Lovenox by [facility]   CAD:  -s/p remote CABG -Denies any anginal symptoms -Continue aspirin 81 mg daily, atorvastatin 40 mg daily -High-sensitivity troponin minimally elevated with flat trend and not consistent with ACS.  Suspect related to demand ischemia in the set of hypotension, tachycardia and acute RV failure  HLD -LDL goal less than 70  -Lipids this admission showed an  LDL 42 and HDL 28 -Continue atorvastatin 40 mg daily  I have personally spent 40 minutes involved in face-to-face and non-face-to-face activities for this patient on the day of the visit. Professional time spent includes the following activities: Preparing to see the patient (review of tests including 2D echo and Chest and abd CT), Obtaining and/or reviewing  separately obtained history (admission/discharge record), Performing a medically appropriate examination and/or evaluation , Ordering medications/tests/procedures, referring and communicating with other health care professionals, Documenting clinical information in the EMR, Independently interpreting results (not separately reported), Communicating results to the patient/family/caregiver, Counseling and educating the patient/family/caregiver and Care coordination (not separately reported).   For questions or updates, please contact [facility] Please consult [email or web address] for contact info under        Signed, Armanda Magic, MD  07/13/2023, 12:33 PM

## 2023-07-14 ENCOUNTER — Other Ambulatory Visit (HOSPITAL_COMMUNITY): Payer: Self-pay

## 2023-07-14 ENCOUNTER — Inpatient Hospital Stay (HOSPITAL_COMMUNITY): Payer: Medicare Other

## 2023-07-14 DIAGNOSIS — I2609 Other pulmonary embolism with acute cor pulmonale: Secondary | ICD-10-CM | POA: Diagnosis not present

## 2023-07-14 DIAGNOSIS — I5031 Acute diastolic (congestive) heart failure: Secondary | ICD-10-CM | POA: Diagnosis not present

## 2023-07-14 DIAGNOSIS — E7800 Pure hypercholesterolemia, unspecified: Secondary | ICD-10-CM | POA: Diagnosis not present

## 2023-07-14 DIAGNOSIS — I2510 Atherosclerotic heart disease of native coronary artery without angina pectoris: Secondary | ICD-10-CM | POA: Diagnosis not present

## 2023-07-14 LAB — GLUCOSE, CAPILLARY
Glucose-Capillary: 171 mg/dL — ABNORMAL HIGH (ref 70–99)
Glucose-Capillary: 107 mg/dL — ABNORMAL HIGH (ref 70–99)
Glucose-Capillary: 138 mg/dL — ABNORMAL HIGH (ref 70–99)
Glucose-Capillary: 160 mg/dL — ABNORMAL HIGH (ref 70–99)

## 2023-07-14 LAB — MRSA NEXT GEN BY PCR, NASAL: MRSA by PCR Next Gen: NOT DETECTED

## 2023-07-14 MED ORDER — DIGOXIN 125 MCG PO TABS
0.1250 mg | ORAL_TABLET | Freq: Every day | ORAL | Status: DC
  Administered 2023-07-15 – 2023-07-23 (×9): 0.125 mg via ORAL
  Filled 2023-07-14 (×9): qty 1

## 2023-07-14 MED ORDER — GUAIFENESIN ER 600 MG PO TB12
600.0000 mg | ORAL_TABLET | Freq: Two times a day (BID) | ORAL | Status: DC
  Administered 2023-07-14 – 2023-07-23 (×17): 600 mg via ORAL
  Filled 2023-07-14 (×18): qty 1

## 2023-07-14 MED ORDER — DIGOXIN 0.25 MG/ML IJ SOLN
0.2500 mg | Freq: Four times a day (QID) | INTRAMUSCULAR | Status: AC
  Administered 2023-07-14 (×2): 0.25 mg via INTRAVENOUS
  Filled 2023-07-14 (×3): qty 1

## 2023-07-14 MED ORDER — ALBUTEROL SULFATE (2.5 MG/3ML) 0.083% IN NEBU: 2.5000 mg | INHALATION_SOLUTION | Freq: Four times a day (QID) | RESPIRATORY_TRACT | Status: DC

## 2023-07-14 MED ORDER — APIXABAN 5 MG PO TABS
5.0000 mg | ORAL_TABLET | Freq: Two times a day (BID) | ORAL | Status: DC
  Administered 2023-07-14 – 2023-07-23 (×18): 5 mg via ORAL
  Filled 2023-07-14 (×18): qty 1

## 2023-07-14 MED ORDER — ALBUTEROL SULFATE (2.5 MG/3ML) 0.083% IN NEBU
2.5000 mg | INHALATION_SOLUTION | Freq: Four times a day (QID) | RESPIRATORY_TRACT | Status: DC | PRN
  Administered 2023-07-15 – 2023-07-17 (×4): 2.5 mg via RESPIRATORY_TRACT
  Filled 2023-07-14 (×4): qty 3

## 2023-07-14 MED ORDER — IPRATROPIUM BROMIDE 0.02 % IN SOLN
0.5000 mg | Freq: Four times a day (QID) | RESPIRATORY_TRACT | Status: DC
  Administered 2023-07-14 – 2023-07-15 (×2): 0.5 mg via RESPIRATORY_TRACT
  Filled 2023-07-14 (×2): qty 2.5

## 2023-07-14 MED ORDER — METHYLPREDNISOLONE SODIUM SUCC 40 MG IJ SOLR
40.0000 mg | Freq: Two times a day (BID) | INTRAMUSCULAR | Status: DC
  Administered 2023-07-14 – 2023-07-19 (×10): 40 mg via INTRAVENOUS
  Filled 2023-07-14 (×10): qty 1

## 2023-07-14 MED ORDER — MIDODRINE HCL 5 MG PO TABS
5.0000 mg | ORAL_TABLET | Freq: Three times a day (TID) | ORAL | Status: DC
  Administered 2023-07-14 – 2023-07-15 (×5): 5 mg via ORAL
  Filled 2023-07-14 (×5): qty 1

## 2023-07-14 NOTE — Progress Notes (Signed)
Heart Failure Navigator Progress Note  Assessed for Heart & Vascular TOC clinic readiness.  Patient does not meet criteria due to EF 60-65%, has a scheduled [facility] appointment on 08/03/23. .   Navigator will sign off at this time.   Rhae Hammock, BSN, Scientist, clinical (histocompatibility and immunogenetics) Only

## 2023-07-14 NOTE — Progress Notes (Signed)
Progress Note  Patient Name: Denise Mckenzie Date of Encounter: 07/14/2023  [facility] Cardiologist: None   Patient Profile     Subjective   Still in a flutter with RVR.  Denies any significant chest pain or shortness of breath.  She does have a cough.  Inpatient Medications    Scheduled Meds:  amitriptyline  50 mg Oral QHS   aspirin EC  81 mg Oral Daily   atorvastatin  40 mg Oral QHS   enoxaparin (LOVENOX) injection  70 mg Subcutaneous Q24H   insulin aspart  0-6 Units Subcutaneous TID WC   ipratropium-albuterol  3 mL Nebulization Q6H   levothyroxine  88 mcg Oral Q0600   midodrine  5 mg Oral TID WC   sodium chloride flush  3 mL Intravenous Q12H   Continuous Infusions:  ceFEPime (MAXIPIME) IV 2 g (07/14/23 0107)   vancomycin 500 mg (07/14/23 0139)   PRN Meds: acetaminophen, albuterol, hydrOXYzine   Vital Signs    Vitals:   07/14/23 0730 07/14/23 0831 07/14/23 1200 07/14/23 1230  BP: (!) 88/60 94/65 104/76   Pulse: (!) 122 (!) 124 (!) 122   Resp:      Temp:  97.7 F (36.5 C) 97.8 F (36.6 C)   TempSrc:  Oral Oral   SpO2: 99% 98% 98% 96%  Weight:        Intake/Output Summary (Last 24 hours) at 07/14/2023 1245 Last data filed at 07/14/2023 0551 Gross per 24 hour  Intake 123.45 ml  Output 1000 ml  Net -876.55 ml      07/14/2023    5:00 AM 07/13/2023    3:00 AM 07/12/2023    2:21 PM  Last 3 Weights  Weight (lbs) 148 lb 13 oz 148 lb 5.9 oz 148 lb  Weight (kg) 67.5 kg 67.3 kg 67.132 kg      Telemetry    Atrial flutter with RVR in the 120s- Personally Reviewed  ECG    No new EKG to review- Personally Reviewed  Physical Exam   GEN: Well nourished, well developed in no acute distress HEENT: Normal NECK: No JVD; No carotid bruits LYMPHATICS: No lymphadenopathy CARDIAC: Tachycardic, no murmurs, rubs, gallops RESPIRATORY:  Clear to auscultation without rales, wheezing or rhonchi  ABDOMEN: Soft, non-tender, non-distended MUSCULOSKELETAL:  2-3+ pitting lower extremity edema; No deformity  SKIN: Warm and dry NEUROLOGIC:  Alert and oriented x 3 PSYCHIATRIC:  Normal affect  Labs    High Sensitivity Troponin:   Recent Labs  Lab 07/12/23 1434 07/12/23 2059  TROPONINIHS 59* 57*      Chemistry Recent Labs  Lab 07/09/23 1131 07/12/23 1434 07/12/23 1500 07/13/23 0341  NA 135 130*  --  131*  K CANCELED 5.7*  --  4.7  CL 94* 94*  --  96*  CO2 23 28  --  24  GLUCOSE CANCELED 85  --  109*  BUN 50* 59*  --  49*  CREATININE 1.74* 1.52*  --  1.33*  CALCIUM 9.3 9.2  --  8.0*  PROT  --   --  6.8  --   ALBUMIN  --   --  3.9  --   AST  --   --  38  --   ALT  --   --  33  --   ALKPHOS  --   --  102  --   BILITOT  --   --  1.0  --   GFRNONAA  --  35*  --  41*  ANIONGAP  --  8  --  11     Hematology Recent Labs  Lab 07/12/23 1434 07/13/23 0038 07/13/23 0341  WBC 10.3  --  8.4  RBC 6.06*  --  5.69*  HGB 17.6* 17.4* 16.3*  HCT 55.0* 54.6* 51.3*  MCV 90.8  --  90.2  MCH 29.0  --  28.6  MCHC 32.0  --  31.8  RDW 17.8*  --  16.7*  PLT 124*  --  108*    BNP Recent Labs  Lab 07/09/23 1131 07/12/23 1434 07/13/23 0038  BNP  --  2,311.4* 2,289.1*  PROBNP 33,547*  --   --      DDimer No results for input(s): "DDIMER" in the last 168 hours.    Radiology    CT Angio Chest Pulmonary Embolism (PE) W or WO Contrast  Result Date: 07/13/2023 CLINICAL DATA:  Atrial flutter. Syncope. Volume overload. Clinical concern for pulmonary embolism. History of breast cancer, lung cancer, COPD and CABG. EXAM: CT ANGIOGRAPHY CHEST WITH CONTRAST TECHNIQUE: Multidetector CT imaging of the chest was performed using the standard protocol during bolus administration of intravenous contrast. Multiplanar CT image reconstructions and MIPs were obtained to evaluate the vascular anatomy. RADIATION DOSE REDUCTION: This exam was performed according to the departmental dose-optimization program which includes automated exposure control,  adjustment of the mA and/or kV according to patient size and/or use of iterative reconstruction technique. CONTRAST:  65mL OMNIPAQUE IOHEXOL 350 MG/ML SOLN COMPARISON:  Chest CT dated 07/12/2023 FINDINGS: Cardiovascular: The inferior aspects of the left lower lobe pulmonary arteries are not well opacified with no discrete pulmonary arterial filling defects seen. This has an appearance compatible with slow flow with atelectasis/airspace consolidation in those regions as well as elevation of the left hemidiaphragm and a small left pleural effusion. Enlarged central pulmonary arteries with a main pulmonary artery diameter of 3.4 cm. Atheromatous calcifications, including the coronary arteries and aorta. Post CABG changes. Mediastinum/Nodes: No enlarged mediastinal, hilar, or axillary lymph nodes. Thyroid gland, trachea, and esophagus demonstrate no significant findings. Lungs/Pleura: Stable right upper lobe scarring/postradiation changes. Small left pleural effusion, increased. Interval left lower lobe volume loss and consolidation. Moderate diffuse peribronchial thickening. No new lung nodules. Stable centrilobular emphysematous changes. Upper Abdomen: Small to moderate amount of free peritoneal fluid without significant change. Noncalcified gallstones in the gallbladder measuring up to at least 1.5 cm in maximum diameter each. Stable small amount of pericholecystic fluid with no gallbladder wall thickening. Atheromatous arterial calcifications. Musculoskeletal: Thoracic spine degenerative changes and mild scoliosis. Review of the MIP images confirms the above findings. IMPRESSION: 1. No pulmonary emboli. 2. Interval left lower lobe volume loss and consolidation with a small left pleural effusion, compatible with atelectasis and possible pneumonia. 3. Moderate bronchitic changes. 4. Enlarged central pulmonary arteries, compatible with pulmonary arterial hypertension. 5. Small to moderate amount of free peritoneal  fluid in the upper abdomen, unchanged. 6. Cholelithiasis. 7.  Calcific coronary artery and aortic atherosclerosis. 8. Stable mild centrilobular emphysema. 9. Stable right upper lobe scarring/postradiation changes. Aortic Atherosclerosis (ICD10-I70.0) and Emphysema (ICD10-J43.9). Electronically Signed   By: Beckie Salts M.D.   On: 07/13/2023 15:44   VAS [place] LOWER EXTREMITY VENOUS (DVT)  Result Date: 07/13/2023  Lower Venous DVT Study Patient Name:  Denise Mckenzie  Date of Exam:   07/13/2023 Medical Rec #: [id number]     Accession #:    [id number] Date of Birth: Aug 11, 1946     Patient Gender: F Patient Age:  77 years Exam Location:  [facility] Procedure:      VAS [place] LOWER EXTREMITY VENOUS (DVT) Referring Phys: Dolly Rias --------------------------------------------------------------------------------  Indications: Swelling, and Edema.  Risk Factors: Recent extended travel. Limitations: Poor ultrasound/tissue interface. Comparison Study: No prior study Performing Technologist: Shona Simpson  Examination Guidelines: A complete evaluation includes B-mode imaging, spectral Doppler, color Doppler, and power Doppler as needed of all accessible portions of each vessel. Bilateral testing is considered an integral part of a complete examination. Limited examinations for reoccurring indications may be performed as noted. The reflux portion of the exam is performed with the patient in reverse Trendelenburg.  +---------+---------------+---------+-----------+----------+--------------+ RIGHT    CompressibilityPhasicitySpontaneityPropertiesThrombus Aging +---------+---------------+---------+-----------+----------+--------------+ CFV      Full           No       Yes        Pulsatile                +---------+---------------+---------+-----------+----------+--------------+ SFJ      Full                                                         +---------+---------------+---------+-----------+----------+--------------+ FV Prox  Full                                                        +---------+---------------+---------+-----------+----------+--------------+ FV Mid   Full                                                        +---------+---------------+---------+-----------+----------+--------------+ FV DistalFull                                                        +---------+---------------+---------+-----------+----------+--------------+ PFV      Full                                                        +---------+---------------+---------+-----------+----------+--------------+ POP      Full           No       Yes        Pulsatile                +---------+---------------+---------+-----------+----------+--------------+ PTV      Full                                                        +---------+---------------+---------+-----------+----------+--------------+ PERO     Full                                                        +---------+---------------+---------+-----------+----------+--------------+   +----+---------------+---------+-----------+----------+--------------+  LEFTCompressibilityPhasicitySpontaneityPropertiesThrombus Aging +----+---------------+---------+-----------+----------+--------------+ CFV Full           No       Yes        Pulsatile                +----+---------------+---------+-----------+----------+--------------+     Summary: RIGHT: - There is no evidence of deep vein thrombosis in the lower extremity.  - No cystic structure found in the popliteal fossa.  LEFT: - No evidence of common femoral vein obstruction.   *See table(s) above for measurements and observations. Electronically signed by Lemar Livings MD on 07/13/2023 at 2:30:21 PM.    Final    IR ABDOMEN US LIMITED  Result Date: 07/13/2023 INDICATION: Abdominal distention, fluid overload. EXAM:  LIMITED ABDOMEN ULTRASOUND FOR ASCITES TECHNIQUE: Limited ultrasound survey for ascites was performed in all four abdominal quadrants. COMPARISON:  CT ABDOMEN PELVIS WO CONTRAST FINDINGS: No significant pocket of fluid or percutaneous window to allow safe paracentesis. Risks outweigh the benefits. IMPRESSION: Trace ascites with no safe window for percutaneous access. No procedure performed. Performed by: Loyce Dys PA-C Marliss Coots, MD [facility] Electronically Signed   By: Marliss Coots M.D.   On: 07/13/2023 11:40   ECHOCARDIOGRAM COMPLETE  Result Date: 07/13/2023    ECHOCARDIOGRAM REPORT   Patient Name:   Denise Mckenzie Date of Exam: 07/13/2023 Medical Rec #:  [id number]    Height:       62.0 in Accession #:    [id number]   Weight:       148.4 lb Date of Birth:  1946-03-20    BSA:          1.684 m Patient Age:    20 years     BP:           91/63 mmHg Patient Gender: F            HR:           122 bpm. Exam Location:  Inpatient Procedure: 2D Echo, Cardiac Doppler and Color Doppler Indications:    Congestive Heart Failure  History:        Patient has prior history of Echocardiogram examinations, most                 recent 08/24/2022. COPD; Risk Factors:Hypertension, Dyslipidemia,                 Diabetes and Former Smoker.  Sonographer:    Karma Ganja Referring Phys: [id number] Dolly Rias  Sonographer Comments: Image acquisition challenging due to uncooperative patient, Image acquisition challenging due to COPD and Image acquisition challenging due to respiratory motion. IMPRESSIONS  1. Left ventricular ejection fraction, by estimation, is 60 to 65%. The left ventricle has normal function. The left ventricle has no regional wall motion abnormalities. Left ventricular diastolic parameters are consistent with Grade I diastolic dysfunction (impaired relaxation). There is the interventricular septum is flattened in systole and diastole, consistent with  right ventricular pressure and volume overload.  2. Right ventricular systolic function is severely reduced. The right ventricular size is moderately enlarged. There is moderately elevated pulmonary artery systolic pressure. The estimated right ventricular systolic pressure is 45.9 mmHg.  3. Right atrial size was severely dilated.  4. The mitral valve is normal in structure. Trivial mitral valve regurgitation.  5. The aortic valve is normal in structure. There is mild calcification of the aortic valve. Aortic valve regurgitation is not visualized.  6. The inferior vena  cava is dilated in size with <50% respiratory variability, suggesting right atrial pressure of 15 mmHg. Comparison(s): Changes from prior study are noted. New severe RV dilation and dysfunction. FINDINGS  Left Ventricle: Left ventricular ejection fraction, by estimation, is 60 to 65%. The left ventricle has normal function. The left ventricle has no regional wall motion abnormalities. The left ventricular internal cavity size was small. There is no left ventricular hypertrophy. The interventricular septum is flattened in systole and diastole, consistent with right ventricular pressure and volume overload. Left ventricular diastolic parameters are consistent with Grade I diastolic dysfunction (impaired relaxation). Right Ventricle: The right ventricular size is moderately enlarged. No increase in right ventricular wall thickness. Right ventricular systolic function is severely reduced. There is moderately elevated pulmonary artery systolic pressure. The tricuspid regurgitant velocity is 2.78 m/s, and with an assumed right atrial pressure of 15 mmHg, the estimated right ventricular systolic pressure is 45.9 mmHg. Left Atrium: Left atrial size was normal in size. Right Atrium: Right atrial size was severely dilated. Pericardium: There is no evidence of pericardial effusion. Mitral Valve: The mitral valve is normal in structure. Trivial mitral valve  regurgitation. Tricuspid Valve: The tricuspid valve is normal in structure. Tricuspid valve regurgitation is mild. Aortic Valve: The aortic valve is normal in structure. There is mild calcification of the aortic valve. Aortic valve regurgitation is not visualized. Aortic valve mean gradient measures 1.0 mmHg. Aortic valve peak gradient measures 2.9 mmHg. Aortic valve  area, by VTI measures 2.41 cm. Pulmonic Valve: The pulmonic valve was grossly normal. Pulmonic valve regurgitation is trivial. Aorta: The aortic root and ascending aorta are structurally normal, with no evidence of dilitation. Venous: The inferior vena cava is dilated in size with less than 50% respiratory variability, suggesting right atrial pressure of 15 mmHg. IAS/Shunts: No atrial level shunt detected by color flow Doppler.  LEFT VENTRICLE PLAX 2D LVIDd:         3.30 cm     Diastology LVIDs:         2.30 cm     LV e' medial:    5.11 cm/s LV PW:         1.20 cm     LV E/e' medial:  16.9 LV IVS:        1.10 cm     LV e' lateral:   13.40 cm/s LVOT diam:     1.90 cm     LV E/e' lateral: 6.5 LV SV:         24 LV SV Index:   14 LVOT Area:     2.84 cm  LV Volumes (MOD) LV vol d, MOD A2C: 15.6 ml LV vol d, MOD A4C: 36.0 ml LV vol s, MOD A2C: 9.8 ml LV vol s, MOD A4C: 15.0 ml LV SV MOD A2C:     5.8 ml LV SV MOD A4C:     36.0 ml LV SV MOD BP:      11.4 ml RIGHT VENTRICLE            IVC RV S prime:     8.16 cm/s  IVC diam: 2.40 cm TAPSE (M-mode): 0.7 cm LEFT ATRIUM             Index        RIGHT ATRIUM           Index LA diam:        4.20 cm 2.49 cm/m   RA Area:     22.10 cm LA  Vol [facility]):   36.6 ml 21.74 ml/m  RA Volume:   68.80 ml  40.86 ml/m LA Vol (A4C):   16.3 ml 9.68 ml/m LA Biplane Vol: 23.7 ml 14.07 ml/m  AORTIC VALVE AV Area (Vmax):    2.31 cm AV Area (Vmean):   2.22 cm AV Area (VTI):     2.41 cm AV Vmax:           85.50 cm/s AV Vmean:          54.300 cm/s AV VTI:            0.100 m AV Peak Grad:      2.9 mmHg AV Mean Grad:      1.0 mmHg  LVOT Vmax:         69.70 cm/s LVOT Vmean:        42.600 cm/s LVOT VTI:          0.085 m LVOT/AV VTI ratio: 0.85  AORTA Ao Root diam: 3.40 cm MITRAL VALVE               TRICUSPID VALVE MV Area (PHT): 9.37 cm    TR Peak grad:   30.9 mmHg MV Decel Time: 81 msec     TR Vmax:        278.00 cm/s MV E velocity: 86.60 cm/s MV A velocity: 39.90 cm/s  SHUNTS MV E/A ratio:  2.17        Systemic VTI:  0.08 m                            Systemic Diam: 1.90 cm Clearnce Hasten Electronically signed by Clearnce Hasten Signature Date/Time: 07/13/2023/11:19:05 AM    Final    US LIVER DOPPLER  Result Date: 07/13/2023 CLINICAL DATA:  Ascites.  Portal hypertension. EXAM: DUPLEX ULTRASOUND OF LIVER TECHNIQUE: Color and duplex Doppler ultrasound was performed to evaluate the hepatic in-flow and out-flow vessels. COMPARISON:  CT 07/12/2023 FINDINGS: Liver: Liver may have a slightly nodular contour. Normal echogenicity of the liver. No discrete liver lesion. Small amount of perihepatic ascites. Calcified gallstone measuring 1.7 cm with posterior acoustic shadowing. Small amount of fluid around the gallbladder. Mild gallbladder wall thickening. Main Portal Vein size: 1.1 cm Portal Vein Velocities Main Prox:  23 cm/sec Main Mid: 23 cm/sec Main Dist:  25 cm/sec Right: 23 cm/sec Left: 22 cm/sec Hepatic Vein Velocities Right:  18 cm/sec Middle:  30 cm/sec Left:  20 cm/sec IVC: Present and patent with normal respiratory phasicity. Hepatic Artery Velocity: Not visualized Splenic Vein Velocity:  10 cm/sec Spleen: 7.4 cm x 7.7 cm x 3.1 cm with a total volume of 90 cm^3 (411 cm^3 is upper limit normal) Portal Vein Occlusion/Thrombus: No Splenic Vein Occlusion/Thrombus: No Ascites: Present Varices: None Normal hepatopetal flow in the portal veins. Normal hepatofugal flow in the hepatic veins. Small amount of fluid around the liver and spleen. Small amount of fluid in lower quadrants. IMPRESSION: 1. Patent portal veins with normal direction of  flow. 2. Small amount of ascites. 3. Cholelithiasis. Mild gallbladder wall thickening is nonspecific and could be related to the ascites. Recommend clinical correlation with regards to acute cholecystitis. 4. Liver may have a slightly nodular contour. These findings are equivocal. Electronically Signed   By: Richarda Overlie M.D.   On: 07/13/2023 07:47   CT CHEST ABDOMEN PELVIS WO CONTRAST  Result Date: 07/12/2023 CLINICAL DATA:  Poly trauma, penetrating. Shortness of breath. Syncope. History of  breast cancer. EXAM: CT CHEST, ABDOMEN AND PELVIS WITHOUT CONTRAST TECHNIQUE: Multidetector CT imaging of the chest, abdomen and pelvis was performed following the standard protocol without IV contrast. RADIATION DOSE REDUCTION: This exam was performed according to the departmental dose-optimization program which includes automated exposure control, adjustment of the mA and/or kV according to patient size and/or use of iterative reconstruction technique. COMPARISON:  PET-CT 06/10/2021.  Chest CT 12/24/2020. FINDINGS: CT CHEST FINDINGS Cardiovascular: Diffuse atherosclerosis of the aorta, great vessels and coronary arteries status post median sternotomy and CABG. Mild central enlargement of the pulmonary arteries. The heart size is normal. There is no pericardial effusion. Mediastinum/Nodes: There are no enlarged mediastinal, hilar, axillary or internal mammary lymph nodes. Surgical clips are present within the left axilla. Hilar assessment is limited by the lack of intravenous contrast, although the hilar contours appear unchanged. The thyroid gland, trachea and esophagus demonstrate no significant findings. Lungs/Pleura: Trace left pleural effusion with dependent left lower lobe atelectasis. No evidence of pneumothorax. Stable probable radiation changes anteriorly in the right upper lobe adjacent to the fiducial markers. No new or enlarging pulmonary nodules identified. Mild underlying centrilobular emphysema.  Musculoskeletal/Chest wall: No chest wall mass or suspicious osseous findings. Stable old fracture of the right 2nd rib anteriorly. Previous median sternotomy. There are degenerative changes in the spine associated with a convex right thoracic scoliosis. Postsurgical changes in the left breast and left axilla. CT ABDOMEN AND PELVIS FINDINGS Hepatobiliary: No focal hepatic abnormalities are identified on noncontrast imaging. There is a small gallstone. The gallbladder appears mildly distended without significant wall thickening. No significant biliary dilatation. Pancreas: Atrophy. No focal abnormality, ductal dilatation or surrounding inflammation. Spleen: No evidence of acute splenic injury. The spleen is normal in size without focal abnormality. Adrenals/Urinary Tract: Stable low-density adrenal adenomas bilaterally; no specific follow-up imaging recommended. No evidence of urinary tract calculus, suspicious renal lesion or hydronephrosis. The bladder appears normal for its degree of distention. Stomach/Bowel: No enteric contrast administered. The stomach appears unremarkable for its degree of distension. No evidence of bowel wall thickening, distention or surrounding inflammatory change. Vascular/Lymphatic: There are no enlarged abdominal or pelvic lymph nodes. Diffuse aortic and branch vessel atherosclerosis without evidence of aneurysm. Reproductive: Evidence of prior hysterectomy. No suspicious adnexal findings. Other: New moderate amount of low-density ascites without evidence of sentinel clot, dependent high density components or pneumoperitoneum. Intact abdominal wall. There is generalized edema throughout the subcutaneous and intra-abdominal fat. Chronic subcutaneous calcifications in the buttocks bilaterally. Musculoskeletal: No evidence of acute fracture or dislocation. IMPRESSION: 1. No evidence of acute traumatic injury within the chest, abdomen or pelvis. 2. New moderate amount of nonspecific  low-density ascites without evidence of sentinel clot, dependent high density components or pneumoperitoneum. Generalized soft tissue edema. 3. Trace left pleural effusion with dependent left lower lobe atelectasis. 4. Stable probable radiation changes anteriorly in the right upper lobe adjacent to the fiducial markers. No new or enlarging pulmonary nodules identified. 5. Cholelithiasis with mild gallbladder distention but no significant wall thickening or biliary dilatation. 6. Stable bilateral adrenal adenomas. 7. Aortic Atherosclerosis (ICD10-I70.0) and Emphysema (ICD10-J43.9). Electronically Signed   By: Carey Bullocks M.D.   On: 07/12/2023 17:07   CT Head Wo Contrast  Result Date: 07/12/2023 CLINICAL DATA:  Polytrauma, blunt. EXAM: CT HEAD WITHOUT CONTRAST CT CERVICAL SPINE WITHOUT CONTRAST TECHNIQUE: Multidetector CT imaging of the head and cervical spine was performed following the standard protocol without intravenous contrast. Multiplanar CT image reconstructions of the cervical spine were also  generated. RADIATION DOSE REDUCTION: This exam was performed according to the departmental dose-optimization program which includes automated exposure control, adjustment of the mA and/or kV according to patient size and/or use of iterative reconstruction technique. COMPARISON:  Head CT 10/22/2015. FINDINGS: CT HEAD FINDINGS Brain: No acute intracranial hemorrhage. Gray-white differentiation is preserved. No hydrocephalus or extra-axial collection. No mass effect or midline shift. Vascular: No hyperdense vessel or unexpected calcification. Skull: No calvarial fracture or suspicious bone lesion. Skull base is unremarkable. Sinuses/Orbits: No acute finding. Other: None. CT CERVICAL SPINE FINDINGS Alignment: No traumatic malalignment. Skull base and vertebrae: No acute fracture. Normal craniocervical junction. No suspicious bone lesions. Soft tissues and spinal canal: No prevertebral fluid or swelling. No visible  canal hematoma. Disc levels: Multilevel cervical spondylosis, worst at C3-4, where there is at least mild spinal canal stenosis. Upper chest: No acute findings. Other: Atherosclerotic calcifications of the carotid bulbs. IMPRESSION: 1. No acute intracranial abnormality. 2. No acute cervical spine fracture or traumatic malalignment. 3. Multilevel cervical spondylosis, worst at C3-4, where there is at least mild spinal canal stenosis. Electronically Signed   By: Orvan Falconer M.D.   On: 07/12/2023 16:29   CT Cervical Spine Wo Contrast  Result Date: 07/12/2023 CLINICAL DATA:  Polytrauma, blunt. EXAM: CT HEAD WITHOUT CONTRAST CT CERVICAL SPINE WITHOUT CONTRAST TECHNIQUE: Multidetector CT imaging of the head and cervical spine was performed following the standard protocol without intravenous contrast. Multiplanar CT image reconstructions of the cervical spine were also generated. RADIATION DOSE REDUCTION: This exam was performed according to the departmental dose-optimization program which includes automated exposure control, adjustment of the mA and/or kV according to patient size and/or use of iterative reconstruction technique. COMPARISON:  Head CT 10/22/2015. FINDINGS: CT HEAD FINDINGS Brain: No acute intracranial hemorrhage. Gray-white differentiation is preserved. No hydrocephalus or extra-axial collection. No mass effect or midline shift. Vascular: No hyperdense vessel or unexpected calcification. Skull: No calvarial fracture or suspicious bone lesion. Skull base is unremarkable. Sinuses/Orbits: No acute finding. Other: None. CT CERVICAL SPINE FINDINGS Alignment: No traumatic malalignment. Skull base and vertebrae: No acute fracture. Normal craniocervical junction. No suspicious bone lesions. Soft tissues and spinal canal: No prevertebral fluid or swelling. No visible canal hematoma. Disc levels: Multilevel cervical spondylosis, worst at C3-4, where there is at least mild spinal canal stenosis. Upper chest:  No acute findings. Other: Atherosclerotic calcifications of the carotid bulbs. IMPRESSION: 1. No acute intracranial abnormality. 2. No acute cervical spine fracture or traumatic malalignment. 3. Multilevel cervical spondylosis, worst at C3-4, where there is at least mild spinal canal stenosis. Electronically Signed   By: Orvan Falconer M.D.   On: 07/12/2023 16:29   DG Chest Port 1 View  Result Date: 07/12/2023 CLINICAL DATA:  Shortness of breath.  Syncope. EXAM: PORTABLE CHEST 1 VIEW COMPARISON:  January 27, 2023. FINDINGS: Stable cardiomediastinal silhouette. Status post coronary artery bypass graft. Minimal left basilar subsegmental atelectasis is noted with small pleural effusion. No definite acute abnormality seen in the right lung. IMPRESSION: Minimal left basilar subsegmental atelectasis with small left pleural effusion. Electronically Signed   By: Lupita Raider M.D.   On: 07/12/2023 15:50    Patient Profile     77 y.o. female  with a hx of CAD (CABG 1996), COPD, HTN, HLD, Breast CA, Lung CA; who is being seen 07/13/2023 for the evaluation of syncope and volume overload at the request of Dolly Rias MD.   Assessment & Plan    Acute Decompensated HFpEF:  -2D  echo this admit shows EF 60 to 65% with G1 DD and IVS flattening in systole and diastole consistent with RV pressure and volume overload -2D echo also shows new severe RV systolic dysfunction with moderate RVE and moderate pulmonary hypertension PASP 45 mmHg with severe right atrial enlargement right atrial pressure estimate 15 mmHg -Chest CT shows new ascites, trace left pleural effusion and left lower lobe atelectasis but was not contrasted so could not rule out PE -Chest CT yesterday showed no evidence of PE.  There was left lower lobe volume loss and consolidation with small left pleural effusion felt to be atelectasis or pneumonia and small amount of free peritoneal fluid in the upper abdomen along with coronary artery  calcifications, centrilobular emphysema -BNP is elevated at 2500  -She had been having problems with hypotension but that has improved and blood pressure now 112/70 mmHg despite hypotension overnight. -She put out 1 L and is net -1.2 L -I think a lot of her heart failure may be being driven by her tachyarrhythmias -Will attempt to get heart rate under better control -Suspect her RV failure and pulmonary hypertension are likely multifactorial with group 3 related to COPD as well as group 2 from pulmonary venous hypertension.   -She has never had a sleep study and I think she needs an outpatient split-night sleep study to rule out significant obstructive sleep apnea -May ultimately need right heart cath if RV failure does not improve on subsequent follow-up echo   Relative Hypotension: Lact nml, LLE tepid, but RLE warm (?celluliis). - suspect hypotension due to low PO intake, tachyarrhythmias and infection and also RV failure  AFL 2:1: no hx of AF/AFL -Currently in atrial flutter with 2-1 block at 130 bpm -Unclear duration of new onset a flutter -Hypotension precludes use of CCB or beta-blocker at this time. -She is at high risk of conversion to sinus rhythm and possible cardioembolic event if we add amiodarone but at this time may not have any other options.  -For now I am going to try to dose of digoxin since renal function has improved. -Will give 0.25 mg IV now and then another dose in 6 hours and then starting tomorrow 0.125 mg daily PO -Hopefully if we get her heart rate slowed down her blood pressure will improve and we can additionally add on metoprolol -She was started on Lovenox by TRH and will transition to Eliquis 5 mg twice daily for CHA2DS2-VASc 2 score of 6 if her repeat Abd and pelvic CT are ok -I think she would benefit from restoration of normal sinus rhythm as I think it is going to be difficult to treat her heart rate in a flutter  -will plan TEE/DCCV on Friday after she has  had at least 3 doses of Eliquis   CAD:  -s/p remote CABG -Denies any chest pain -Continue aspirin 81 mg daily and atorvastatin 40 mg daily -High-sensitivity troponin minimally elevated with flat trend and not consistent with ACS.  Suspect related to demand ischemia in the set of hypotension, tachycardia and acute RV failure  HLD -LDL goal less than 70  -Lipids this admission showed an LDL 42 and HDL 28 -Continue atorvastatin 40 mg daily  I have personally spent 40 minutes involved in face-to-face and non-face-to-face activities for this patient on the day of the visit. Professional time spent includes the following activities: Preparing to see the patient (review of tests including 2D echo and Chest and abd CT), Obtaining and/or reviewing separately obtained  history (admission/discharge record), Performing a medically appropriate examination and/or evaluation , Ordering medications/tests/procedures, referring and communicating with other health care professionals, Documenting clinical information in the EMR, Independently interpreting results (not separately reported), Communicating results to the patient/family/caregiver, Counseling and educating the patient/family/caregiver and Care coordination (not separately reported).   For questions or updates, please contact [facility] Please consult [email or web address] for contact info under        Signed, Armanda Magic, MD  07/14/2023, 12:45 PM

## 2023-07-14 NOTE — TOC Benefit Eligibility Note (Signed)
Pharmacy Patient Advocate Encounter  Insurance verification completed.    The patient is insured through  [employer] Part D    Ran test claim for Eliquis. Currently a quantity of 60 is a 30 day supply and the co-pay is $149.53 .  Ran test claim for Xarelto. Currently a quantity of 30 is a 30 day supply and the co-pay is $143.30. Marland Kitchen  This test claim was processed through [facility]- copay amounts may vary at other pharmacies due to pharmacy/plan contracts, or as the patient moves through the different stages of their insurance plan.

## 2023-07-14 NOTE — Progress Notes (Signed)
  Progress Note   Patient: Denise Mckenzie OZH:[id number] DOB: 1946-08-11 DOA: 07/12/2023     2 DOS: the patient was seen and examined on 07/14/2023   Brief hospital course: No notes on file  Assessment and Plan:   77 year old female with history of CAD, CABG in 1996, COPD, hypertension, hyperlipidemia, breast cancer, lung cancer transferred from [facility] be emergency department after having an episode of syncope.  Patient has been having worsening of the lower extremity edema, weight increase from 129 lb to 148 lb, worsening of the shortness of breath.  Patient hardly can walk across the room without experiencing shortness of breath.  Despite increasing the Lasix once a day to 2 times a day did not have any improvement. .  Workup in the emergency department patient was found to be in sinus rhythm  Congestive heart failure diastolic acute on chronic - cardiology is consulted  -continue with diuresis -echocardiogram showed severe right heart failure with RV dilatation -CTA chest negative for PE -plan to do right heart catheterization.  COPD exacerbation -Continue to DuoNebs, Solu-Medrol -Patient has bilateral diffuse wheezing  Atrial fibrillation with the RVR -continue with metoprolol -Starting the patient on digoxin -Plan for DCCV -Starting the patient on Eliquis 5 mg twice daily  Pneumonia healthcare associated - continue vancomycin, cefepime  Acute kidney injury  -secondary to ATN from hypotension - keep systolic blood pressure greater than 100 mm Hg -added midodrine 5 mg 3 times daily.  Hyponatremia - hypervolemic hyponatremia - continue with diuresis and follow-up   Subjective: Complaining of significantly congested cough, unable to bring up the sputum Physical Exam: Vitals:   07/14/23 0730 07/14/23 0831 07/14/23 1200 07/14/23 1230  BP: (!) 88/60 94/65 104/76   Pulse: (!) 122 (!) 124 (!) 122   Resp:      Temp:  97.7 F (36.5 C) 97.8 F (36.6 C)   TempSrc:  Oral Oral    SpO2: 99% 98% 98% 96%  Weight:       Gen: Awake, alert, NAD   CV: Regular, tachycardic, normal S1, S2,.   Pericardial rub Resp: Bilateral wheezing, prolonged expiratory phase Abd: Obese, slightly distended, normoactive, nontender MSK: There is 3+ soft pitting edema up towards the knee then continues in a dependent fashion presacrally.  No significant edema in the upper extremities Skin: Excoriations and numerous healing scabs over the upper and lower extremities.  Additionally there is a faint petechial rash distally in the lower extremities which is nonpalpable/nontender.  Over the right knee there is increased warmth and erythema without induration.  Neuro: Alert and interactive  Psych: euthymic, appropriate  Data Reviewed: { Reviewed by me  Family Communication: Patient's grandchildren  Disposition: Status is: Inpatient   Planned Discharge Destination:  pending clinical improvement  Time spent:  40 minutes  Author: Susa Griffins, MD 07/14/2023 1:14 PM  For on call review [email or web address].

## 2023-07-14 NOTE — Care Management Important Message (Signed)
Important Message  Patient Details  Name: Denise Mckenzie MRN: [id number] Date of Birth: April 24, 1946   Important Message Given:  Yes - Medicare IM     Sherilyn Banker 07/14/2023, 2:05 PM

## 2023-07-14 NOTE — Progress Notes (Signed)
Mobility Specialist Progress Note:   07/14/23 1459  Mobility  Activity Transferred from bed to chair  Level of Assistance Minimal assist, patient does 75% or more  Assistive Device Front wheel walker  Distance Ambulated (ft) 3 ft  Activity Response Tolerated well  Mobility Referral Yes  $Mobility charge 1 Mobility  Mobility Specialist Start Time (ACUTE ONLY) 1427  Mobility Specialist Stop Time (ACUTE ONLY) 1457  Mobility Specialist Time Calculation (min) (ACUTE ONLY) 30 min   Pre Mobility: 123 HR , 96% SpO2 2 L  During Mobility: 126 HR ,  93% SpO2 2 L Post Mobility: 123 HR , 90% SpO2 2 L  Pt received in bed, agreeable to transfer to chair. MinA bed mobility. CG to stand and pivot from elevated bed height. During transition pt urinated on floor d/t purewick malfunctions. Assisted with gown change and clean up with sanitary wipes. Pt c/o slight SOB and leg weakness, slight unsteadiness but no LOB present. Pursed lip breathing encouraged. Pt left in chair with RN present in room.   Leory Plowman  Mobility Specialist Please contact via [employer] office at [phone]

## 2023-07-14 NOTE — Progress Notes (Signed)
TRH night cross cover note:   I was notified by RN that this patient is congested sounding cough.  I subsequently placed order for scheduled guaifenesin, with first dose to occur now.    Newton Pigg, DO Hospitalist

## 2023-07-14 NOTE — Progress Notes (Signed)
  Cardiology to perform TEE/DCCV for atrial flutter. Patient will need to be switched to Eliquis from Lovenox by the evening of 11/27 for this to take place. Question of a hemoperitoneum on CT and TRH is going to repeat a CT of the abdomen. Once cleared, will switch to Eliquis.    Informed Consent   Shared Decision Making/Informed Consent   The risks [stroke, cardiac arrhythmias rarely resulting in the need for a temporary or permanent pacemaker, skin irritation or burns, esophageal damage, perforation (1:10,000 risk), bleeding, pharyngeal hematoma as well as other potential complications associated with conscious sedation including aspiration, arrhythmia, respiratory failure and death], benefits (treatment guidance, restoration of normal sinus rhythm, diagnostic support) and alternatives of a transesophageal echocardiogram guided cardioversion were discussed in detail with Ms. Holderness and she is willing to proceed.     Perlie Gold, PA-C

## 2023-07-15 DIAGNOSIS — I50811 Acute right heart failure: Secondary | ICD-10-CM | POA: Diagnosis not present

## 2023-07-15 DIAGNOSIS — E782 Mixed hyperlipidemia: Secondary | ICD-10-CM

## 2023-07-15 DIAGNOSIS — I5031 Acute diastolic (congestive) heart failure: Secondary | ICD-10-CM | POA: Diagnosis not present

## 2023-07-15 DIAGNOSIS — I4892 Unspecified atrial flutter: Secondary | ICD-10-CM | POA: Diagnosis not present

## 2023-07-15 DIAGNOSIS — I2510 Atherosclerotic heart disease of native coronary artery without angina pectoris: Secondary | ICD-10-CM | POA: Diagnosis not present

## 2023-07-15 DIAGNOSIS — I50810 Right heart failure, unspecified: Secondary | ICD-10-CM

## 2023-07-15 DIAGNOSIS — Z951 Presence of aortocoronary bypass graft: Secondary | ICD-10-CM

## 2023-07-15 LAB — BASIC METABOLIC PANEL
Anion gap: 8 (ref 5–15)
BUN: 31 mg/dL — ABNORMAL HIGH (ref 8–23)
CO2: 28 mmol/L (ref 22–32)
Calcium: 8.7 mg/dL — ABNORMAL LOW (ref 8.9–10.3)
Chloride: 96 mmol/L — ABNORMAL LOW (ref 98–111)
Creatinine, Ser: 1.01 mg/dL — ABNORMAL HIGH (ref 0.44–1.00)
GFR, Estimated: 57 mL/min — ABNORMAL LOW (ref >60)
Glucose, Bld: 144 mg/dL — ABNORMAL HIGH (ref 70–99)
Potassium: 3.9 mmol/L (ref 3.5–5.1)
Sodium: 132 mmol/L — ABNORMAL LOW (ref 135–145)

## 2023-07-15 LAB — GLUCOSE, CAPILLARY
Glucose-Capillary: 135 mg/dL — ABNORMAL HIGH (ref 70–99)
Glucose-Capillary: 181 mg/dL — ABNORMAL HIGH (ref 70–99)
Glucose-Capillary: 161 mg/dL — ABNORMAL HIGH (ref 70–99)

## 2023-07-15 MED ORDER — MIDODRINE HCL 5 MG PO TABS
10.0000 mg | ORAL_TABLET | Freq: Three times a day (TID) | ORAL | Status: DC
  Administered 2023-07-15 – 2023-07-19 (×12): 10 mg via ORAL
  Filled 2023-07-15 (×12): qty 2

## 2023-07-15 MED ORDER — VANCOMYCIN HCL 750 MG/150ML IV SOLN
750.0000 mg | INTRAVENOUS | Status: DC
  Administered 2023-07-15: 750 mg via INTRAVENOUS
  Filled 2023-07-15 (×2): qty 150

## 2023-07-15 MED ORDER — SODIUM CHLORIDE 0.9 % IV SOLN
2.0000 g | Freq: Two times a day (BID) | INTRAVENOUS | Status: DC
  Administered 2023-07-15 – 2023-07-16 (×2): 2 g via INTRAVENOUS
  Filled 2023-07-15 (×2): qty 12.5

## 2023-07-15 MED ORDER — FUROSEMIDE 10 MG/ML IJ SOLN
20.0000 mg | Freq: Every day | INTRAMUSCULAR | Status: DC
  Administered 2023-07-15 – 2023-07-18 (×4): 20 mg via INTRAVENOUS
  Filled 2023-07-15 (×4): qty 2

## 2023-07-15 MED ORDER — LACTULOSE 10 GM/15ML PO SOLN
20.0000 g | Freq: Two times a day (BID) | ORAL | Status: DC
  Administered 2023-07-15 – 2023-07-21 (×11): 20 g via ORAL
  Filled 2023-07-15 (×15): qty 30

## 2023-07-15 MED ORDER — SODIUM CHLORIDE 0.9% FLUSH
10.0000 mL | Freq: Two times a day (BID) | INTRAVENOUS | Status: DC
  Administered 2023-07-15 (×2): 10 mL via INTRAVENOUS

## 2023-07-15 NOTE — Plan of Care (Signed)
  Problem: Education: Goal: Ability to describe self-care measures that may prevent or decrease complications (Diabetes Survival Skills Education) will improve Outcome: Progressing Goal: Individualized Educational Video(s) Outcome: Progressing   Problem: Coping: Goal: Ability to adjust to condition or change in health will improve Outcome: Progressing   Problem: Fluid Volume: Goal: Ability to maintain a balanced intake and output will improve Outcome: Progressing   Problem: Health Behavior/Discharge Planning: Goal: Ability to identify and utilize available resources and services will improve Outcome: Progressing Goal: Ability to manage health-related needs will improve Outcome: Progressing   Problem: Metabolic: Goal: Ability to maintain appropriate glucose levels will improve Outcome: Progressing   Problem: Nutritional: Goal: Maintenance of adequate nutrition will improve Outcome: Progressing Goal: Progress toward achieving an optimal weight will improve Outcome: Progressing   Problem: Skin Integrity: Goal: Risk for impaired skin integrity will decrease Outcome: Progressing   Problem: Tissue Perfusion: Goal: Adequacy of tissue perfusion will improve Outcome: Progressing   Problem: Education: Goal: Knowledge of General Education information will improve Description: Including pain rating scale, medication(s)/side effects and non-pharmacologic comfort measures Outcome: Progressing   Problem: Health Behavior/Discharge Planning: Goal: Ability to manage health-related needs will improve Outcome: Progressing   Problem: Clinical Measurements: Goal: Ability to maintain clinical measurements within normal limits will improve Outcome: Progressing Goal: Will remain free from infection Outcome: Progressing Goal: Diagnostic test results will improve Outcome: Progressing Goal: Respiratory complications will improve Outcome: Progressing Goal: Cardiovascular complication will  be avoided Outcome: Progressing   Problem: Activity: Goal: Risk for activity intolerance will decrease Outcome: Progressing   Problem: Nutrition: Goal: Adequate nutrition will be maintained Outcome: Progressing   Problem: Coping: Goal: Level of anxiety will decrease Outcome: Progressing   Problem: Elimination: Goal: Will not experience complications related to bowel motility Outcome: Progressing Goal: Will not experience complications related to urinary retention Outcome: Progressing   Problem: Safety: Goal: Ability to remain free from injury will improve Outcome: Progressing

## 2023-07-15 NOTE — Progress Notes (Signed)
Measurements for compression socks: Right ankle:25cm Right calf: 32cm Left ankle: 25cm Left calf: 33cm

## 2023-07-15 NOTE — Progress Notes (Signed)
Pharmacy Antibiotic Note  Denise Mckenzie is a 77 y.o. female admitted on 07/12/2023 with syncope/volume overload and concerns for sepsis. PMH includes CAD, MI, HLD, breast/lung cancer. Noted to have cellulitis around right knee. Pharmacy consulted for cefepime and vancomycin dosing.  SCr has trended down.  Plan: -Adjust Cefepime 2g IV every 12 hours -Adjust Vancomycin to 750 mg IV every 24 hours (AUC 435, Vd 0.72, IBW) -Monitor renal function -Follow up signs of clinical improvement, LOT, de-escalation of antibiotics   Weight: 70.3 kg (154 lb 15.7 oz)  Temp (24hrs), Avg:97.5 F (36.4 C), Min:97.1 F (36.2 C), Max:97.6 F (36.4 C)  Recent Labs  Lab 07/09/23 1131 07/12/23 1434 07/12/23 1540 07/13/23 0038 07/13/23 0341 07/15/23 0244  WBC  --  10.3  --   --  8.4  --   CREATININE 1.74* 1.52*  --   --  1.33* 1.01*  LATICACIDVEN  --   --  1.4 1.7  --   --     Estimated Creatinine Clearance: 41.5 mL/min (A) (by C-G formula based on SCr of 1.01 mg/dL (H)).    No Known Allergies  Antimicrobials this admission: Cefepime 11/26 >>  Vancomycin 11/26 >>   Microbiology results: 11/26 BCx: ngtd 11/25 COVID/RSV/Flu: negative 11/27 MRSA: negative  Thank you for allowing pharmacy to be a part of this patient's care.   Signe Colt, PharmD 07/15/2023 12:38 PM  **Pharmacist phone directory can be found on [email or web address] listed under [facility]**

## 2023-07-15 NOTE — Progress Notes (Signed)
  Progress Note   Patient: Denise Mckenzie UXN:[id number] DOB: Jan 09, 1946 DOA: 07/12/2023     3 DOS: the patient was seen and examined on 07/15/2023   Brief hospital course: No notes on file  Assessment and Plan:   77 year old female with history of CAD, CABG in 1996, COPD, hypertension, hyperlipidemia, breast cancer, lung cancer transferred from [facility] be emergency department after having an episode of syncope.  Patient has been having worsening of the lower extremity edema, weight increase from 129 lb to 148 lb, worsening of the shortness of breath.  Patient hardly can walk across the room without experiencing shortness of breath.  Despite increasing the Lasix once a day to 2 times a day did not have any improvement. .  Workup in the emergency department patient was found to be in sinus rhythm  Congestive heart failure diastolic acute on chronic - cardiology is consulted  -continue with diuresis -echocardiogram showed severe right heart failure with RV dilatation -CTA chest negative for PE -plan to do right heart catheterization.  COPD exacerbation -Continue to DuoNebs, Solu-Medrol -Patient has bilateral diffuse wheezing  Atrial fibrillation with the RVR -continue with metoprolol -Starting the patient on digoxin -Plan for DCCV -Starting the patient on Eliquis 5 mg twice daily  Pneumonia healthcare associated - continue vancomycin, cefepime -Some improvement with the cough and congestion from yesterday  Acute kidney injury  -secondary to ATN from hypotension - keep systolic blood pressure greater than 100 mm Hg -added midodrine 5 mg 3 times daily.  Hyponatremia - hypervolemic hyponatremia - continue with diuresis and follow-up   Subjective: Still has shortness of breath.  Patient has petechial rash on both forearms, bilateral lower extremities.  No rash on the trunk  physical Exam: Vitals:   07/15/23 1109 07/15/23 1200 07/15/23 1206 07/15/23 1207  BP: 110/60     Pulse: (!)  124     Resp: (!) 24     Temp: (!) 97.3 F (36.3 C)     TempSrc: Oral     SpO2: 92% (!) 89% 90% 92%  Weight:       Gen: Awake, alert, NAD   CV: Regular, tachycardic, normal S1, S2,.   Pericardial rub Resp: Bilateral wheezing, prolonged expiratory phase Abd: Obese, slightly distended, normoactive, nontender MSK: There is 3+ soft pitting edema up towards the knee then continues in a dependent fashion presacrally.  No significant edema in the upper extremities Skin: Excoriations and numerous healing scabs over the upper and lower extremities.  Additionally there is a faint petechial rash distally in the lower extremities which is nonpalpable/nontender.  Over the right knee there is increased warmth and erythema without induration.  Neuro: Alert and interactive  Psych: euthymic, appropriate  Data Reviewed: { Reviewed by me  Family Communication: Patient's grandchildren  Disposition: Status is: Inpatient   Planned Discharge Destination:  pending clinical improvement  Time spent:  40 minutes  Author: Susa Griffins, MD 07/15/2023 1:11 PM  For on call review [email or web address].

## 2023-07-15 NOTE — Plan of Care (Signed)
  Problem: Clinical Measurements: Goal: Respiratory complications will improve Outcome: Not Progressing   

## 2023-07-15 NOTE — Progress Notes (Signed)
Patient Name: Denise Mckenzie Date of Encounter: 07/15/2023 [facility] Cardiologist: Gypsy Balsam, MD    Interval Summary  .    No events overnight. Daughter, Denise Mckenzie, at bedside Denies chest pain. Shortness of breath remains. Reduced functional capacity.  Vital Signs .    Vitals:   07/15/23 0039 07/15/23 0438 07/15/23 0749 07/15/23 0950  BP: 109/62 103/73 106/63   Pulse: (!) 120 (!) 120 (!) 121 (!) 123  Resp: 17 15 14    Temp: 97.6 F (36.4 C) 97.6 F (36.4 C) (!) 97.1 F (36.2 C)   TempSrc: Axillary Oral Axillary   SpO2: 96% 96% 94%   Weight:  70.3 kg      Intake/Output Summary (Last 24 hours) at 07/15/2023 1054 Last data filed at 07/15/2023 0800 Gross per 24 hour  Intake 400.03 ml  Output 300 ml  Net 100.03 ml   Net IO Since Admission: -1,100.07 mL [07/15/23 1054]     07/15/2023    4:38 AM 07/14/2023    5:00 AM 07/13/2023    3:00 AM  Last 3 Weights  Weight (lbs) 154 lb 15.7 oz 148 lb 13 oz 148 lb 5.9 oz  Weight (kg) 70.3 kg 67.5 kg 67.3 kg      Telemetry/ECG    Episodes of atrial flutter with intermittent sinus tachycardia- Personally Reviewed  Studies reviewed:  CT Abdomen Pelvis w/o contrast: 07/14/2023  1. New mural thickening extending from the cecum through the hepatic flexure of the colon, consistent with colitis. This could be inflammatory, infectious, or less likely ischemic. 2. Stable small volume ascites, most pronounced surrounding the liver and spleen. 3. No evidence of retroperitoneal hematoma. 4. Small bilateral pleural effusions with dependent left lower lobe atelectasis. 5. Cholelithiasis without cholecystitis. 6. Diffuse body wall edema unchanged. 7.  Aortic Atherosclerosis (ICD10-I70.0).  Echo:  07/13/2023  1. Left ventricular ejection fraction, by estimation, is 60 to 65%. The  left ventricle has normal function. The left ventricle has no regional  wall motion abnormalities. Left ventricular diastolic parameters are   consistent with Grade I diastolic  dysfunction (impaired relaxation). There is the interventricular septum is  flattened in systole and diastole, consistent with right ventricular  pressure and volume overload.   2. Right ventricular systolic function is severely reduced. The right  ventricular size is moderately enlarged. There is moderately elevated  pulmonary artery systolic pressure. The estimated right ventricular  systolic pressure is 45.9 mmHg.   3. Right atrial size was severely dilated.   4. The mitral valve is normal in structure. Trivial mitral valve  regurgitation.   5. The aortic valve is normal in structure. There is mild calcification  of the aortic valve. Aortic valve regurgitation is not visualized.   6. The inferior vena cava is dilated in size with <50% respiratory  variability, suggesting right atrial pressure of 15 mmHg.   Physical Exam .   GEN: Appears older than stated age, hemodynamically stable, no acute distress, nasal cannula oxygen Neck: No JVD Cardiac: Tachycardic, positive S1-S2, no murmurs rubs or gallops appreciated secondary to tachycardia Respiratory: Decreased breath sounds bilaterally, poor inspiratory effort GI: Soft, nontender, non-distended  MS: Generalized swelling, 3+ bilateral lower extremities Skin: Rash upper and lower extremity  Assessment & Plan .    Acute HFpEF, stage C, NYHA class IIIb Acute right ventricular heart failure Echo notes preserved LVEF, grade 1 diastolic dysfunction, right ventricular systolic function is severely reduced in size is moderately enlarged, mild pulm hypertension per echocardiography,  right atrium is severely dilated, estimated RAP 15 mmHg  Uptitration of GDMT has been difficult due to hypotension and atrial flutter with RVR.  Currently on midodrine 5 mg p.o. 3 times daily-will increase to 10 mg p.o. 3 times daily  Agree with digoxin for rate control strategy and given her RV function  Would like to avoid  beta-blockers in acute decompensated HFpEF.  Given her SBP's not a great candidate for calcium channel blockers.  If systolic blood pressures improve consider amiodarone drip for rate and rhythm management for short-term.  Long-term management will need to be discussed with the patient given her history of COPD and amiodarone side effect profile.  Will start Lasix 20 mg IV push daily, hold if SBP <100 mmHg.  No significant urine output -encouraged strict I's and O's and daily weights. Net IO Since Admission: -1,100.07 mL [07/15/23 1054]  Uptitrate GDMT as hemodynamics and laboratory values allow.  Bilateral compression stockings to help mobilize fluid.  Albumin status is 3.9.   Sleep study as outpatient to evaluate for sleep apnea given the degree of RV dysfunction.  Will need to be evaluated for home O2 prior to discharge.  When she is euvolemic would benefit from a right heart catheterization to evaluate for pulmonary hypertension.  And even consider left heart catheterization given her history of CAD and CABG.  This is something to consider, I have not spoken to the family about it yet.  Newly discovered atrial flutter: Rate control: Digoxin. Rhythm control: N/A. Thromboembolic prophylaxis: Eliquis (transition from Lovenox 07/14/2023) Tentatively being scheduled for TEE guided cardioversion tomorrow 07/16/2023. She intermittently has episodes of sinus tachycardia-rhythm will need to be reassessed prior to procedure tomorrow.  I have also asked attending physician to weigh in on the possibility of SIRS/sepsis underlying and action given her CT results, hyperthermia, hypotension requiring midodrine, and tachycardia.  Of note, no leukocytosis that she is on antibiotics.  Coronary artery disease with prior CABG and no angina pectoris: Denies anginal chest pain. Continue low-dose aspirin and statin therapy. High sensitive troponins minimally elevated likely secondary to supply demand ischemia  for reasons mentioned above. Monitor for now  Hyperlipidemia: Currently on atorvastatin.   She denies myalgia or other side effects. Most recent lipids dated November 2024, independently reviewed.  LDL at goal, 42 mg/dL  Rash: management per primary.   Total encounter time 57 minutes.  *Total Encounter Time as defined by the Centers for Medicare and Medicaid Services includes, in addition to the face-to-face time of a patient visit (documented in the note above) non-face-to-face time: obtaining and reviewing outside history, ordering and reviewing medications, review of telemetry, EKG, echocardiogram, plan of care discussed with nursing staff and attending physician, updated her daughter Denise Mckenzie, and documentation in the medical record.    For questions or updates, please contact  [facility] Please consult [email or web address] for contact info under     Signed, Tessa Lerner, DO, [facility]  [facility]  [address] [place], [place] [address] Pager: [phone] Office: [phone] 10:54 AM 07/15/23

## 2023-07-16 ENCOUNTER — Other Ambulatory Visit: Payer: Self-pay

## 2023-07-16 ENCOUNTER — Encounter (HOSPITAL_COMMUNITY)
Admission: EM | Disposition: A | Discharge status: Skilled Nursing Facility | Payer: Self-pay | Source: Home / Self Care | Attending: Internal Medicine

## 2023-07-16 ENCOUNTER — Inpatient Hospital Stay (HOSPITAL_COMMUNITY): Payer: Medicare Other | Admitting: Anesthesiology

## 2023-07-16 ENCOUNTER — Encounter (HOSPITAL_COMMUNITY): Payer: Self-pay | Admitting: Internal Medicine

## 2023-07-16 ENCOUNTER — Inpatient Hospital Stay (HOSPITAL_COMMUNITY): Payer: Medicare Other

## 2023-07-16 DIAGNOSIS — I2510 Atherosclerotic heart disease of native coronary artery without angina pectoris: Secondary | ICD-10-CM | POA: Diagnosis not present

## 2023-07-16 DIAGNOSIS — I2609 Other pulmonary embolism with acute cor pulmonale: Secondary | ICD-10-CM | POA: Diagnosis not present

## 2023-07-16 DIAGNOSIS — I4891 Unspecified atrial fibrillation: Secondary | ICD-10-CM

## 2023-07-16 DIAGNOSIS — I5031 Acute diastolic (congestive) heart failure: Secondary | ICD-10-CM | POA: Diagnosis not present

## 2023-07-16 DIAGNOSIS — E7800 Pure hypercholesterolemia, unspecified: Secondary | ICD-10-CM | POA: Diagnosis not present

## 2023-07-16 DIAGNOSIS — I4892 Unspecified atrial flutter: Secondary | ICD-10-CM | POA: Diagnosis not present

## 2023-07-16 HISTORY — PX: CARDIOVERSION: EP1203

## 2023-07-16 HISTORY — PX: TRANSESOPHAGEAL ECHOCARDIOGRAM (CATH LAB): EP1270

## 2023-07-16 LAB — BLOOD GAS, ARTERIAL
Acid-Base Excess: 4.5 mmol/L — ABNORMAL HIGH (ref 0.0–2.0)
Bicarbonate: 34.1 mmol/L — ABNORMAL HIGH (ref 20.0–28.0)
Drawn by: 5260
O2 Saturation: 98.7 %
Patient temperature: 36.7
pCO2 arterial: 75 mm[Hg] (ref 32–48)
pH, Arterial: 7.26 — ABNORMAL LOW (ref 7.35–7.45)
pO2, Arterial: 86 mm[Hg] (ref 83–108)

## 2023-07-16 LAB — GLUCOSE, CAPILLARY
Glucose-Capillary: 148 mg/dL — ABNORMAL HIGH (ref 70–99)
Glucose-Capillary: 153 mg/dL — ABNORMAL HIGH (ref 70–99)
Glucose-Capillary: 151 mg/dL — ABNORMAL HIGH (ref 70–99)
Glucose-Capillary: 222 mg/dL — ABNORMAL HIGH (ref 70–99)

## 2023-07-16 LAB — ECHO TEE

## 2023-07-16 SURGERY — TRANSESOPHAGEAL ECHOCARDIOGRAM (TEE) (CATHLAB)
Anesthesia: General

## 2023-07-16 MED ORDER — PHENYLEPHRINE HCL (PRESSORS) 10 MG/ML IV SOLN
INTRAVENOUS | Status: DC | PRN
  Administered 2023-07-16: 80 ug via INTRAVENOUS

## 2023-07-16 MED ORDER — SODIUM CHLORIDE 0.9 % IV SOLN
2.0000 g | INTRAVENOUS | Status: AC
  Administered 2023-07-16 – 2023-07-19 (×4): 2 g via INTRAVENOUS
  Filled 2023-07-16 (×4): qty 20

## 2023-07-16 MED ORDER — PROPOFOL 10 MG/ML IV BOLUS
INTRAVENOUS | Status: DC | PRN
  Administered 2023-07-16: 30 mg via INTRAVENOUS

## 2023-07-16 MED ORDER — ALBUTEROL SULFATE (2.5 MG/3ML) 0.083% IN NEBU
INHALATION_SOLUTION | RESPIRATORY_TRACT | Status: AC
  Administered 2023-07-16: 2.5 mg
  Filled 2023-07-16: qty 3

## 2023-07-16 MED ORDER — SODIUM CHLORIDE 0.9 % IV SOLN
INTRAVENOUS | Status: DC | PRN
Start: 2023-07-16 — End: 2023-07-16

## 2023-07-16 SURGICAL SUPPLY — 1 items: PAD DEFIB RADIO PHYSIO CONN (PAD) ×1 IMPLANT

## 2023-07-16 NOTE — Progress Notes (Addendum)
Mobility Specialist Progress Note    07/16/23 1535  Mobility  Activity Transferred to/from [facility]  Level of Assistance Moderate assist, patient does 50-74%  Assistive Device Front wheel walker  Distance Ambulated (ft) 4 ft (2+2)  Activity Response Tolerated poorly  Mobility Referral Yes  $Mobility charge 1 Mobility  Mobility Specialist Start Time (ACUTE ONLY) 1515  Mobility Specialist Stop Time (ACUTE ONLY) 1536  Mobility Specialist Time Calculation (min) (ACUTE ONLY) 21 min   Pt received in bed requesting to use bathroom. Pt agreeable to use BSC. Pt had void. Pt able to static stand w/ RW for pericare. Pt then took a few pivotal steps towards the bed. Pt knees buckled and pt assisted to the floor into a kneeling position with both knees on the ground and leaned back onto the side of the bed. Pt's grandson-in-law came to side of bed to assist pt as +2 back into the bed. NT present for vitals. Pt stated "my ankles and my shins just give out." Pt stated nothing hurt. RN notified. MD notified. Left supine in bed with NT present.   Elsie Nation Mobility Specialist  Please Neurosurgeon or Rehab Office at [phone]

## 2023-07-16 NOTE — Progress Notes (Signed)
Pt placed on BiPAP due to ABG. Pt is tolerating BiPAP at this time. MD and RN aware.

## 2023-07-16 NOTE — Progress Notes (Signed)
Patient Name: Denise Mckenzie Date of Encounter: 07/16/2023 [facility] Cardiologist: Gypsy Balsam, MD    Interval Summary  .    Doing well.  Sleeping this am.  Family at bedside Remains in atrial flutter with RVR. She put out 600cc yesterday and is net neg 1.35L since admit.  Vital Signs .    Vitals:   07/15/23 1927 07/16/23 0018 07/16/23 0500 07/16/23 0542  BP: 111/64 97/64  106/62  Pulse: (!) 122 (!) 116  (!) 106  Resp: 18 18  18   Temp: 97.7 F (36.5 C) (!) 97.4 F (36.3 C)  97.6 F (36.4 C)  TempSrc: Axillary Axillary  Axillary  SpO2: 98% 96%  97%  Weight:   70 kg     Intake/Output Summary (Last 24 hours) at 07/16/2023 0732 Last data filed at 07/16/2023 0427 Gross per 24 hour  Intake 550.03 ml  Output 600 ml  Net -49.97 ml   Net IO Since Admission: -1,350.07 mL [07/16/23 0732]     07/16/2023    5:00 AM 07/15/2023    4:38 AM 07/14/2023    5:00 AM  Last 3 Weights  Weight (lbs) 154 lb 5.2 oz 154 lb 15.7 oz 148 lb 13 oz  Weight (kg) 70 kg 70.3 kg 67.5 kg      Telemetry/ECG    Episodes of atrial flutter with intermittent sinus tachycardia- Personally Reviewed  Studies reviewed:  CT Abdomen Pelvis w/o contrast: 07/14/2023  1. New mural thickening extending from the cecum through the hepatic flexure of the colon, consistent with colitis. This could be inflammatory, infectious, or less likely ischemic. 2. Stable small volume ascites, most pronounced surrounding the liver and spleen. 3. No evidence of retroperitoneal hematoma. 4. Small bilateral pleural effusions with dependent left lower lobe atelectasis. 5. Cholelithiasis without cholecystitis. 6. Diffuse body wall edema unchanged. 7.  Aortic Atherosclerosis (ICD10-I70.0).  Echo:  07/13/2023  1. Left ventricular ejection fraction, by estimation, is 60 to 65%. The  left ventricle has normal function. The left ventricle has no regional  wall motion abnormalities. Left ventricular diastolic  parameters are  consistent with Grade I diastolic  dysfunction (impaired relaxation). There is the interventricular septum is  flattened in systole and diastole, consistent with right ventricular  pressure and volume overload.   2. Right ventricular systolic function is severely reduced. The right  ventricular size is moderately enlarged. There is moderately elevated  pulmonary artery systolic pressure. The estimated right ventricular  systolic pressure is 45.9 mmHg.   3. Right atrial size was severely dilated.   4. The mitral valve is normal in structure. Trivial mitral valve  regurgitation.   5. The aortic valve is normal in structure. There is mild calcification  of the aortic valve. Aortic valve regurgitation is not visualized.   6. The inferior vena cava is dilated in size with <50% respiratory  variability, suggesting right atrial pressure of 15 mmHg.   Physical Exam .   GEN: Well nourished, well developed in no acute distress HEENT: Normal NECK: No JVD; No carotid bruits LYMPHATICS: No lymphadenopathy CARDIAC:RRR, no murmurs, rubs, gallops RESPIRATORY:  Clear to auscultation without rales, wheezing or rhonchi  ABDOMEN: Soft, non-tender, non-distended MUSCULOSKELETAL:  No edema; No deformity  SKIN: Warm and dry NEUROLOGIC:  Alert and oriented x 3 PSYCHIATRIC:  Normal affect  Assessment & Plan .    Acute Decompensated HFpEF:  -2D echo this admit shows EF 60 to 65% with G1 DD and IVS flattening in systole  and diastole consistent with RV pressure and volume overload -2D echo also shows new severe RV systolic dysfunction with moderate RVE and moderate pulmonary hypertension PASP 45 mmHg with severe right atrial enlargement right atrial pressure estimate 15 mmHg -Chest CT shows new ascites, trace left pleural effusion and left lower lobe atelectasis but was not contrasted so could not rule out PE -Chest CTA showed no evidence of PE.  There was left lower lobe volume loss and  consolidation with small left pleural effusion felt to be atelectasis or pneumonia and small amount of free peritoneal fluid in the upper abdomen along with coronary artery calcifications, centrilobular emphysema -BNP was elevated at 2500  -give Lasix 20mg  IV yesterday >>will need slow diuresis to due soft BP -She put out 600cc and is net neg 1.35L since admit -I think a lot of her heart failure may be being driven by her tachyarrhythmias -GDMT: has been difficult due to hypotension and aflutter with RVR -avoid BB in setting of acute CHF -continue digoxin 0.125mg  daily (HR improved after starting) -Suspect her RV failure and pulmonary hypertension are likely multifactorial with group 3 related to COPD as well as group 2 from pulmonary venous hypertension.   -She has never had a sleep study and I think she needs an outpatient split-night sleep study to rule out significant obstructive sleep apnea -May ultimately need right heart cath if RV failure does not improve on subsequent follow-up echo   Relative Hypotension: Lact nml, LLE tepid, but RLE warm (?celluliis). - suspect hypotension due to low PO intake, tachyarrhythmias and infection and also RV failure   AFL 2:1: no hx of AF/AF -Unclear duration of new onset a flutter -got 1 dose of  Dig yesterday with improvement in HR -Hypotension/CHF precludes use of CCB or beta-blocker at this time. -She is at high risk of conversion to sinus rhythm and possible cardioembolic event if we add amiodarone so will continue to try to rate control with dig -continue Apixaban 5mg  BID for CHA2DS2-VASc 2 score of 6  -I think she would benefit from restoration of normal sinus rhythm as I think it is going to be difficult to treat her heart rate in a flutter  -she is NPO today for TEE/DCCV later today>>discussed with family at the bedside   CAD:  -s/p remote CABG -no complaints of CP -continue ASA 81mg  daily and Atorvastatin 40mg  daily -High-sensitivity  troponin minimally elevated with flat trend and not consistent with ACS.  -Suspect related to demand ischemia in the set of hypotension, tachycardia and acute RV failure   HLD -LDL goal < 70  -Lipids this admission showed an LDL 42 and HDL 28 -Continue atorvastatin 40 mg daily    I have personally spent 35 minutes involved in face-to-face and non-face-to-face activities for this patient on the day of the visit. Professional time spent includes the following activities: Preparing to see the patient (review of tests), Obtaining and/or reviewing separately obtained history (admission/discharge record), Performing a medically appropriate examination and/or evaluation , Ordering medications/tests/procedures, referring and communicating with other health care professionals, Documenting clinical information in the EMR, Independently interpreting results (not separately reported), Communicating results to the patient/family/caregiver, Counseling and educating the patient/family/caregiver and Care coordination (not separately reported).   For questions or updates, please contact [facility] Please consult [email or web address] for contact info under     Signed, Tessa Lerner, DO, [facility]  [facility]  [address] [place], [place] [address] Pager: [phone]  Office: [phone] 7:32 AM 07/16/23

## 2023-07-16 NOTE — Progress Notes (Signed)
Fall per note at 1535.  Charge RN notified 30 minutes after the fall.

## 2023-07-16 NOTE — Transfer of Care (Signed)
Immediate Anesthesia Transfer of Care Note  Patient: Denise Mckenzie  Procedure(s) Performed: TRANSESOPHAGEAL ECHOCARDIOGRAM CARDIOVERSION  Patient Location: PACU  Anesthesia Type:MAC  Level of Consciousness: awake  Airway & Oxygen Therapy: Patient connected to face mask oxygen  Post-op Assessment: Post -op Vital signs reviewed and stable  Post vital signs: stable  Last Vitals:  Vitals Value Taken Time  BP 130/102 07/16/23 1145  Temp    Pulse 102 07/16/23 1147  Resp 24 07/16/23 1147  SpO2 97 % 07/16/23 1147  Vitals shown include unfiled device data.  Last Pain:  Vitals:   07/16/23 0833  TempSrc: Temporal  PainSc:          Complications: There were no known notable events for this encounter.

## 2023-07-16 NOTE — Progress Notes (Signed)
                07/16/23 2056  Therapy Vitals  Pulse Rate 100  Resp 18  MEWS Score/Color  MEWS Score 0  MEWS Score Color Green  Oxygen Therapy/Pulse Ox  O2 Device Nasal Cannula  O2 Therapy Oxygen humidified  O2 Flow Rate (L/min) 4 L/min  SpO2 95 %

## 2023-07-16 NOTE — H&P (View-Only) (Signed)
Patient Name: Denise Mckenzie Date of Encounter: 07/16/2023 [facility] Cardiologist: Gypsy Balsam, MD    Interval Summary  .    Doing well.  Sleeping this am.  Family at bedside Remains in atrial flutter with RVR. She put out 600cc yesterday and is net neg 1.35L since admit.  Vital Signs .    Vitals:   07/15/23 1927 07/16/23 0018 07/16/23 0500 07/16/23 0542  BP: 111/64 97/64  106/62  Pulse: (!) 122 (!) 116  (!) 106  Resp: 18 18  18   Temp: 97.7 F (36.5 C) (!) 97.4 F (36.3 C)  97.6 F (36.4 C)  TempSrc: Axillary Axillary  Axillary  SpO2: 98% 96%  97%  Weight:   70 kg     Intake/Output Summary (Last 24 hours) at 07/16/2023 0732 Last data filed at 07/16/2023 0427 Gross per 24 hour  Intake 550.03 ml  Output 600 ml  Net -49.97 ml   Net IO Since Admission: -1,350.07 mL [07/16/23 0732]     07/16/2023    5:00 AM 07/15/2023    4:38 AM 07/14/2023    5:00 AM  Last 3 Weights  Weight (lbs) 154 lb 5.2 oz 154 lb 15.7 oz 148 lb 13 oz  Weight (kg) 70 kg 70.3 kg 67.5 kg      Telemetry/ECG    Episodes of atrial flutter with intermittent sinus tachycardia- Personally Reviewed  Studies reviewed:  CT Abdomen Pelvis w/o contrast: 07/14/2023  1. New mural thickening extending from the cecum through the hepatic flexure of the colon, consistent with colitis. This could be inflammatory, infectious, or less likely ischemic. 2. Stable small volume ascites, most pronounced surrounding the liver and spleen. 3. No evidence of retroperitoneal hematoma. 4. Small bilateral pleural effusions with dependent left lower lobe atelectasis. 5. Cholelithiasis without cholecystitis. 6. Diffuse body wall edema unchanged. 7.  Aortic Atherosclerosis (ICD10-I70.0).  Echo:  07/13/2023  1. Left ventricular ejection fraction, by estimation, is 60 to 65%. The  left ventricle has normal function. The left ventricle has no regional  wall motion abnormalities. Left ventricular diastolic  parameters are  consistent with Grade I diastolic  dysfunction (impaired relaxation). There is the interventricular septum is  flattened in systole and diastole, consistent with right ventricular  pressure and volume overload.   2. Right ventricular systolic function is severely reduced. The right  ventricular size is moderately enlarged. There is moderately elevated  pulmonary artery systolic pressure. The estimated right ventricular  systolic pressure is 45.9 mmHg.   3. Right atrial size was severely dilated.   4. The mitral valve is normal in structure. Trivial mitral valve  regurgitation.   5. The aortic valve is normal in structure. There is mild calcification  of the aortic valve. Aortic valve regurgitation is not visualized.   6. The inferior vena cava is dilated in size with <50% respiratory  variability, suggesting right atrial pressure of 15 mmHg.   Physical Exam .   GEN: Well nourished, well developed in no acute distress HEENT: Normal NECK: No JVD; No carotid bruits LYMPHATICS: No lymphadenopathy CARDIAC:RRR, no murmurs, rubs, gallops RESPIRATORY:  Clear to auscultation without rales, wheezing or rhonchi  ABDOMEN: Soft, non-tender, non-distended MUSCULOSKELETAL:  No edema; No deformity  SKIN: Warm and dry NEUROLOGIC:  Alert and oriented x 3 PSYCHIATRIC:  Normal affect  Assessment & Plan .    Acute Decompensated HFpEF:  -2D echo this admit shows EF 60 to 65% with G1 DD and IVS flattening in systole  and diastole consistent with RV pressure and volume overload -2D echo also shows new severe RV systolic dysfunction with moderate RVE and moderate pulmonary hypertension PASP 45 mmHg with severe right atrial enlargement right atrial pressure estimate 15 mmHg -Chest CT shows new ascites, trace left pleural effusion and left lower lobe atelectasis but was not contrasted so could not rule out PE -Chest CTA showed no evidence of PE.  There was left lower lobe volume loss and  consolidation with small left pleural effusion felt to be atelectasis or pneumonia and small amount of free peritoneal fluid in the upper abdomen along with coronary artery calcifications, centrilobular emphysema -BNP was elevated at 2500  -give Lasix 20mg  IV yesterday >>will need slow diuresis to due soft BP -She put out 600cc and is net neg 1.35L since admit -I think a lot of her heart failure may be being driven by her tachyarrhythmias -GDMT: has been difficult due to hypotension and aflutter with RVR -avoid BB in setting of acute CHF -continue digoxin 0.125mg  daily (HR improved after starting) -Suspect her RV failure and pulmonary hypertension are likely multifactorial with group 3 related to COPD as well as group 2 from pulmonary venous hypertension.   -She has never had a sleep study and I think she needs an outpatient split-night sleep study to rule out significant obstructive sleep apnea -May ultimately need right heart cath if RV failure does not improve on subsequent follow-up echo   Relative Hypotension: Lact nml, LLE tepid, but RLE warm (?celluliis). - suspect hypotension due to low PO intake, tachyarrhythmias and infection and also RV failure   AFL 2:1: no hx of AF/AF -Unclear duration of new onset a flutter -got 1 dose of  Dig yesterday with improvement in HR -Hypotension/CHF precludes use of CCB or beta-blocker at this time. -She is at high risk of conversion to sinus rhythm and possible cardioembolic event if we add amiodarone so will continue to try to rate control with dig -continue Apixaban 5mg  BID for CHA2DS2-VASc 2 score of 6  -I think she would benefit from restoration of normal sinus rhythm as I think it is going to be difficult to treat her heart rate in a flutter  -she is NPO today for TEE/DCCV later today>>discussed with family at the bedside   CAD:  -s/p remote CABG -no complaints of CP -continue ASA 81mg  daily and Atorvastatin 40mg  daily -High-sensitivity  troponin minimally elevated with flat trend and not consistent with ACS.  -Suspect related to demand ischemia in the set of hypotension, tachycardia and acute RV failure   HLD -LDL goal < 70  -Lipids this admission showed an LDL 42 and HDL 28 -Continue atorvastatin 40 mg daily    I have personally spent 35 minutes involved in face-to-face and non-face-to-face activities for this patient on the day of the visit. Professional time spent includes the following activities: Preparing to see the patient (review of tests), Obtaining and/or reviewing separately obtained history (admission/discharge record), Performing a medically appropriate examination and/or evaluation , Ordering medications/tests/procedures, referring and communicating with other health care professionals, Documenting clinical information in the EMR, Independently interpreting results (not separately reported), Communicating results to the patient/family/caregiver, Counseling and educating the patient/family/caregiver and Care coordination (not separately reported).   For questions or updates, please contact [facility] Please consult [email or web address] for contact info under     Signed, Tessa Lerner, DO, [facility]  [facility]  [address] [place], [place] [address] Pager: [phone]  Office: [phone] 7:32 AM 07/16/23

## 2023-07-16 NOTE — Progress Notes (Signed)
Pt removed BIPAP stated she couldn't breath. Educated pt on importance's of BIPAP. MD made aware. Will try again later.

## 2023-07-16 NOTE — Interval H&P Note (Signed)
History and Physical Interval Note:  07/16/2023 8:38 AM  Denise Mckenzie  has presented today for surgery, with the diagnosis of afib.  The various methods of treatment have been discussed with the patient and family. After consideration of risks, benefits and other options for treatment, the patient has consented to  Procedure(s): TRANSESOPHAGEAL ECHOCARDIOGRAM (N/A) CARDIOVERSION (N/A) as a surgical intervention.  The patient's history has been reviewed, patient examined, no change in status, stable for surgery.  I have reviewed the patient's chart and labs.  Questions were answered to the patient's satisfaction.     NPO for TEE/DCCV.   Gerri Spore T. Flora Lipps, MD, [facility]  [facility]  [address] [place], [place] [address] [phone]  8:38 AM

## 2023-07-16 NOTE — Progress Notes (Signed)
Patient had removed BIPAP and does not wish to wear it at this time. Patient is oriented to time and place wide awake and answering questions appropriately. Bedside RN aware and will monitor for patient to continue to be appropriate,

## 2023-07-16 NOTE — Anesthesia Postprocedure Evaluation (Signed)
Anesthesia Post Note  Patient: Denise Mckenzie  Procedure(s) Performed: TRANSESOPHAGEAL ECHOCARDIOGRAM CARDIOVERSION     Patient location during evaluation: PACU Anesthesia Type: General Level of consciousness: awake and alert and oriented Pain management: pain level controlled Vital Signs Assessment: post-procedure vital signs reviewed and stable Respiratory status: spontaneous breathing, nonlabored ventilation, respiratory function stable and patient connected to nasal cannula oxygen Cardiovascular status: blood pressure returned to baseline and stable Postop Assessment: no apparent nausea or vomiting Anesthetic complications: no   There were no known notable events for this encounter.  Last Vitals:  Vitals:   07/16/23 0833 07/16/23 0900  BP: (!) 146/87 116/62  Pulse: (!) 123 (!) 123  Resp:  (!) 24  Temp: (!) 36.3 C   SpO2: 95% 95%    Last Pain:  Vitals:   07/16/23 0833  TempSrc: Temporal  PainSc:                  [place] A.

## 2023-07-16 NOTE — Progress Notes (Signed)
  Progress Note   Patient: Denise Mckenzie UVO:[id number] DOB: [birth year] DOA: 07/12/2023     4 DOS: the patient was seen and examined on 07/16/2023   Brief hospital course: No notes on file  Assessment and Plan:   77 year old female with history of CAD, CABG in 1996, COPD, hypertension, hyperlipidemia, breast cancer, lung cancer transferred from [facility] be emergency department after having an episode of syncope.  Patient has been having worsening of the lower extremity edema, weight increase from 129 lb to 148 lb, worsening of the shortness of breath.  Patient hardly can walk across the room without experiencing shortness of breath.  Despite increasing the Lasix once a day to 2 times a day did not have any improvement. .  Workup in the emergency department patient was found to be in sinus rhythm  Congestive heart failure diastolic acute on chronic - cardiology is consulted  -continue with diuresis -echocardiogram showed severe right heart failure with RV dilatation -CTA chest negative for PE -plan to do right heart catheterization. -Underwent transesophageal echocardiogram-no left atrial thrombus, severe RV dilation/severe RV dysfunction.  Cardioverted A-fib, converted to normal sinus rhythm 11/29  COPD exacerbation -Continue to DuoNebs, Solu-Medrol -Patient has bilateral diffuse wheezing  Atrial fibrillation with the RVR -continue with metoprolol -Starting the patient on digoxin -Plan for DCCV -Starting the patient on Eliquis 5 mg twice daily  Pneumonia healthcare associated - continue vancomycin, cefepime -Some improvement with the cough and congestion from yesterday  Acute kidney injury  -secondary to ATN from hypotension - keep systolic blood pressure greater than 100 mm Hg -added midodrine 5 mg 3 times daily.  Hyponatremia - hypervolemic hyponatremia - continue with diuresis and follow-up   Subjective: Underwent transesophageal echocardiogram   physical Exam: Vitals:    07/16/23 1100 07/16/23 1145 07/16/23 1200 07/16/23 1236  BP:  (!) 130/102 (!) 129/98 126/82  Pulse: (!) 125 (!) 103 (!) 104 100  Resp: (!) 21 (!) 25 (!) 24 (!) 21  Temp: 98 F (36.7 C)   98 F (36.7 C)  TempSrc:    Oral  SpO2: 98% 99% 96% 97%  Weight:       Gen: Awake, alert, NAD   CV: Regular, tachycardic, normal S1, S2,.   Pericardial rub Resp: Bilateral wheezing, prolonged expiratory phase Abd: Obese, slightly distended, normoactive, nontender MSK: There is 3+ soft pitting edema up towards the knee then continues in a dependent fashion presacrally.  No significant edema in the upper extremities Skin: Excoriations and numerous healing scabs over the upper and lower extremities.  Additionally there is a faint petechial rash distally in the lower extremities which is nonpalpable/nontender.  Over the right knee there is increased warmth and erythema without induration.  Neuro: Alert and interactive  Psych: euthymic, appropriate  Data Reviewed: { Reviewed by me  Family Communication: Patient's grandchildren  Disposition: Status is: Inpatient   Planned Discharge Destination:  pending clinical improvement  Time spent:  40 minutes  Author: Susa Griffins, MD 07/16/2023 2:36 PM  For on call review [email or web address].

## 2023-07-16 NOTE — Anesthesia Preprocedure Evaluation (Addendum)
Anesthesia Evaluation  Patient identified by MRN, date of birth, ID band Patient awake    Reviewed: Allergy & Precautions, NPO status , Patient's Chart, lab work & pertinent test results, reviewed documented beta blocker date and time   Airway Mallampati: III  TM Distance: >3 FB     Dental  (+) Upper Dentures, Lower Dentures   Pulmonary COPD,  COPD inhaler, former smoker   breath sounds clear to auscultation + decreased breath sounds      Cardiovascular hypertension, Pt. on medications and Pt. on home beta blockers + CAD, + Past MI, + CABG and +CHF  + dysrhythmias Atrial Fibrillation  Rhythm:Irregular Rate:Normal  EKG 07/15/23 Sinus tachycardia Low voltage QRS Lateral infarct , age undetermined Possible Inferior infarct , age undetermined  Echo 07/11/23 1. Left ventricular ejection fraction, by estimation, is 60 to 65%. The  left ventricle has normal function. The left ventricle has no regional  wall motion abnormalities. Left ventricular diastolic parameters are  consistent with Grade I diastolic  dysfunction (impaired relaxation). There is the interventricular septum is  flattened in systole and diastole, consistent with right ventricular  pressure and volume overload.   2. Right ventricular systolic function is severely reduced. The right  ventricular size is moderately enlarged. There is moderately elevated  pulmonary artery systolic pressure. The estimated right ventricular  systolic pressure is 45.9 mmHg.   3. Right atrial size was severely dilated.   4. The mitral valve is normal in structure. Trivial mitral valve  regurgitation.   5. The aortic valve is normal in structure. There is mild calcification  of the aortic valve. Aortic valve regurgitation is not visualized.   6. The inferior vena cava is dilated in size with <50% respiratory  variability, suggesting right atrial pressure of 15 mmHg.   Comparison(s):  Changes from prior study are noted. New severe RV dilation  and dysfunction.      Neuro/Psych  PSYCHIATRIC DISORDERS  Depression    negative neurological ROS     GI/Hepatic negative GI ROS,,,Hepatic steatosis   Endo/Other  diabetes, Well Controlled, Type 2, Oral Hypoglycemic AgentsHypothyroidism  Hyperlipidemia  Renal/GU negative Renal ROS  negative genitourinary   Musculoskeletal  (+) Arthritis , Osteoarthritis,  Osteoporosis   Abdominal   Peds  Hematology negative hematology ROS (+) Eliquis therapy- last dose this am   Anesthesia Other Findings   Reproductive/Obstetrics                              Anesthesia Physical Anesthesia Plan  ASA: 3  Anesthesia Plan: General   Post-op Pain Management: Minimal or no pain anticipated   Induction: Intravenous  PONV Risk Score and Plan: Treatment may vary due to age or medical condition and Propofol infusion  Airway Management Planned: Natural Airway and Nasal Cannula  Additional Equipment: None  Intra-op Plan:   Post-operative Plan:   Informed Consent: I have reviewed the patients History and Physical, chart, labs and discussed the procedure including the risks, benefits and alternatives for the proposed anesthesia with the patient or authorized representative who has indicated his/her understanding and acceptance.       Plan Discussed with: Anesthesiologist and CRNA  Anesthesia Plan Comments:          Anesthesia Quick Evaluation

## 2023-07-16 NOTE — CV Procedure (Signed)
   TRANSESOPHAGEAL ECHOCARDIOGRAM GUIDED DIRECT CURRENT CARDIOVERSION  NAME:  Denise Mckenzie    MRN: [id number] DOB:  1945-11-01    ADMIT DATE: 07/12/2023  INDICATIONS: Symptomatic atrial flutter  PROCEDURE:   Informed consent was obtained prior to the procedure. The risks, benefits and alternatives for the procedure were discussed and the patient comprehended these risks.  Risks include, but are not limited to, cough, sore throat, vomiting, nausea, somnolence, esophageal and stomach trauma or perforation, bleeding, low blood pressure, aspiration, pneumonia, infection, trauma to the teeth and death.    After a procedural time-out, the oropharynx was anesthetized and the patient was sedated by the anesthesia service. The transesophageal probe was inserted in the esophagus and stomach without difficulty and multiple views were obtained. Anesthesia was monitored by Dr. Malen Gauze.   COMPLICATIONS:    Complications: No complications Patient tolerated procedure well.  KEY FINDINGS:  No LAA thrombus.  Severe RV dilation/severe RV dysfunction .  Normal function.  Full Report to follow.   CARDIOVERSION:     Indications:  Symptomatic Atrial Flutter  Procedure Details:  Once the TEE was complete, the patient had the defibrillator pads placed in the anterior and posterior position. Once an appropriate level of sedation was confirmed, the patient was cardioverted x 1 with 200J of biphasic synchronized energy.  The patient converted to NSR.  There were no apparent complications.  The patient had normal neuro status and respiratory status post procedure with vitals stable as recorded elsewhere.  Adequate airway was maintained throughout and vital signs monitored per protocol.  Gerri Spore T. Flora Lipps, MD [facility]  [address] [place], [place] [address] [phone]  11:28 AM

## 2023-07-16 NOTE — Progress Notes (Signed)
Echocardiogram 2D Echocardiogram has been performed.  Lucendia Herrlich 07/16/2023, 11:37 AM

## 2023-07-17 DIAGNOSIS — I5031 Acute diastolic (congestive) heart failure: Secondary | ICD-10-CM | POA: Diagnosis not present

## 2023-07-17 DIAGNOSIS — I2609 Other pulmonary embolism with acute cor pulmonale: Secondary | ICD-10-CM | POA: Diagnosis not present

## 2023-07-17 DIAGNOSIS — E7800 Pure hypercholesterolemia, unspecified: Secondary | ICD-10-CM | POA: Diagnosis not present

## 2023-07-17 DIAGNOSIS — I2510 Atherosclerotic heart disease of native coronary artery without angina pectoris: Secondary | ICD-10-CM | POA: Diagnosis not present

## 2023-07-17 DIAGNOSIS — I50811 Acute right heart failure: Secondary | ICD-10-CM | POA: Diagnosis not present

## 2023-07-17 LAB — CBC
HCT: 50.2 % — ABNORMAL HIGH (ref 36.0–46.0)
Hemoglobin: 15.4 g/dL — ABNORMAL HIGH (ref 12.0–15.0)
MCH: 28.7 pg (ref 26.0–34.0)
MCHC: 30.7 g/dL (ref 30.0–36.0)
MCV: 93.7 fL (ref 80.0–100.0)
Platelets: 98 10*3/uL — ABNORMAL LOW (ref 150–400)
RBC: 5.36 MIL/uL — ABNORMAL HIGH (ref 3.87–5.11)
RDW: 17.3 % — ABNORMAL HIGH (ref 11.5–15.5)
WBC: 11.5 10*3/uL — ABNORMAL HIGH (ref 4.0–10.5)
nRBC: 0 % (ref 0.0–0.2)

## 2023-07-17 LAB — BASIC METABOLIC PANEL
Anion gap: 5 (ref 5–15)
BUN: 27 mg/dL — ABNORMAL HIGH (ref 8–23)
CO2: 30 mmol/L (ref 22–32)
Calcium: 8.9 mg/dL (ref 8.9–10.3)
Chloride: 103 mmol/L (ref 98–111)
Creatinine, Ser: 0.78 mg/dL (ref 0.44–1.00)
GFR, Estimated: >60 mL/min (ref >60)
Glucose, Bld: 155 mg/dL — ABNORMAL HIGH (ref 70–99)
Potassium: 4.2 mmol/L (ref 3.5–5.1)
Sodium: 138 mmol/L (ref 135–145)

## 2023-07-17 LAB — GLUCOSE, CAPILLARY
Glucose-Capillary: 149 mg/dL — ABNORMAL HIGH (ref 70–99)
Glucose-Capillary: 181 mg/dL — ABNORMAL HIGH (ref 70–99)
Glucose-Capillary: 99 mg/dL (ref 70–99)
Glucose-Capillary: 158 mg/dL — ABNORMAL HIGH (ref 70–99)

## 2023-07-17 MED ORDER — BISACODYL 10 MG RE SUPP
10.0000 mg | Freq: Once | RECTAL | Status: AC
  Administered 2023-07-17: 10 mg via RECTAL
  Filled 2023-07-17: qty 1

## 2023-07-17 MED ORDER — DOCUSATE SODIUM 100 MG PO CAPS
100.0000 mg | ORAL_CAPSULE | Freq: Two times a day (BID) | ORAL | Status: DC
  Administered 2023-07-17 – 2023-07-23 (×12): 100 mg via ORAL
  Filled 2023-07-17 (×13): qty 1

## 2023-07-17 MED ORDER — FUROSEMIDE 10 MG/ML IJ SOLN
20.0000 mg | Freq: Once | INTRAMUSCULAR | Status: AC
  Administered 2023-07-17: 20 mg via INTRAVENOUS
  Filled 2023-07-17: qty 2

## 2023-07-17 NOTE — Plan of Care (Signed)

## 2023-07-17 NOTE — Progress Notes (Signed)
Rounding Note    Patient Name: Denise Mckenzie Date of Encounter: 07/17/2023  [facility] Cardiologist: Earley Favor  Subjective   Some improvement in SOB  Inpatient Medications    Scheduled Meds:  amitriptyline  50 mg Oral QHS   apixaban  5 mg Oral BID   aspirin EC  81 mg Oral Daily   atorvastatin  40 mg Oral QHS   bisacodyl  10 mg Rectal Once   digoxin  0.125 mg Oral Daily   docusate sodium  100 mg Oral BID   furosemide  20 mg Intravenous Daily   guaiFENesin  600 mg Oral BID   insulin aspart  0-6 Units Subcutaneous TID WC   lactulose  20 g Oral BID   levothyroxine  88 mcg Oral Q0600   methylPREDNISolone (SOLU-MEDROL) injection  40 mg Intravenous Q12H   midodrine  10 mg Oral TID WC   sodium chloride flush  3 mL Intravenous Q12H   Continuous Infusions:  cefTRIAXone (ROCEPHIN)  IV Stopped (07/16/23 1511)   PRN Meds: acetaminophen, albuterol, hydrOXYzine   Vital Signs    Vitals:   07/17/23 0800 07/17/23 0815 07/17/23 0900 07/17/23 0911  BP: 122/61  117/61   Pulse: (!) 114 91 88 91  Resp: 16 (!) 22 (!) 24 20  Temp: 98.1 F (36.7 C)     TempSrc: Oral     SpO2: 97% 97% 97% 98%  Weight:        Intake/Output Summary (Last 24 hours) at 07/17/2023 1102 Last data filed at 07/17/2023 0800 Gross per 24 hour  Intake 381.04 ml  Output 600 ml  Net -218.96 ml      07/16/2023    5:00 AM 07/15/2023    4:38 AM 07/14/2023    5:00 AM  Last 3 Weights  Weight (lbs) 154 lb 5.2 oz 154 lb 15.7 oz 148 lb 13 oz  Weight (kg) 70 kg 70.3 kg 67.5 kg      Telemetry    SR, PACs - Personally Reviewed  ECG    N/a - Personally Reviewed  Physical Exam   GEN: No acute distress.   Neck: + JVD Cardiac: RRR, no murmurs, rubs, or gallops.  Respiratory: Clear to auscultation bilaterally. GI: Soft, nontender, non-distended  MS: 2+ bilateral LE edema Neuro:  Nonfocal  Psych: Normal affect   Labs    High Sensitivity Troponin:   Recent Labs  Lab 07/12/23 1434  07/12/23 2059  TROPONINIHS 59* 57*     Chemistry Recent Labs  Lab 07/12/23 1500 07/13/23 0341 07/15/23 0244 07/17/23 0238  NA  --  131* 132* 138  K  --  4.7 3.9 4.2  CL  --  96* 96* 103  CO2  --  24 28 30   GLUCOSE  --  109* 144* 155*  BUN  --  49* 31* 27*  CREATININE  --  1.33* 1.01* 0.78  CALCIUM  --  8.0* 8.7* 8.9  MG  --  2.0  --   --   PROT 6.8  --   --   --   ALBUMIN 3.9  --   --   --   AST 38  --   --   --   ALT 33  --   --   --   ALKPHOS 102  --   --   --   BILITOT 1.0  --   --   --   GFRNONAA  --  41* 57* >60  ANIONGAP  --  11 8 5     Lipids  Recent Labs  Lab 07/13/23 0341  CHOL 91  TRIG 104  HDL 28*  LDLCALC 42  CHOLHDL 3.3    Hematology Recent Labs  Lab 07/12/23 1434 07/13/23 0038 07/13/23 0341 07/17/23 0238  WBC 10.3  --  8.4 11.5*  RBC 6.06*  --  5.69* 5.36*  HGB 17.6* 17.4* 16.3* 15.4*  HCT 55.0* 54.6* 51.3* 50.2*  MCV 90.8  --  90.2 93.7  MCH 29.0  --  28.6 28.7  MCHC 32.0  --  31.8 30.7  RDW 17.8*  --  16.7* 17.3*  PLT 124*  --  108* 98*   Thyroid  Recent Labs  Lab 07/13/23 0038 07/13/23 0341  TSH 0.321*  --   FREET4  --  0.97    BNP Recent Labs  Lab 07/12/23 1434 07/13/23 0038  BNP 2,311.4* 2,289.1*    DDimer No results for input(s): "DDIMER" in the last 168 hours.   Radiology    ECHO TEE  Result Date: 07/16/2023    TRANSESOPHOGEAL ECHO REPORT   Patient Name:   Denise Mckenzie Asfour Date of Exam: 07/16/2023 Medical Rec #:  [id number]    Height:       62.0 in Accession #:    [id number]   Weight:       154.3 lb Date of Birth:  [birth year]    BSA:          1.712 m Patient Age:    77 years     BP:           135/71 mmHg Patient Gender: F            HR:           125 bpm. Exam Location:  Inpatient Procedure: Transesophageal Echo, Cardiac Doppler and Color Doppler Indications:     Atrial Fibrillation  History:         Patient has prior history of Echocardiogram examinations, most                  recent 07/13/2023. CHF, CAD and Previous  Myocardial Infarction,                  Prior CABG, COPD, Arrythmias:Atrial Flutter and Tachycardia;                  Risk Factors:Hypertension, Diabetes and Dyslipidemia. Breast                  cancer (L) breast.  Sonographer:     Lucendia Herrlich RCS Referring Phys:  [id number] Ronnald Ramp O'NEAL Diagnosing Phys: Lennie Odor MD PROCEDURE: After discussion of the risks and benefits of a TEE, an informed consent was obtained from the patient. TEE procedure time was 8 minutes. The transesophogeal probe was passed without difficulty through the esophogus of the patient. Imaged were  obtained with the patient in a supine position. Sedation performed by different physician. The patient was monitored while under deep sedation. Anesthestetic sedation was provided intravenously by Anesthesiology: 50mg  of Propofol. Image quality was excellent. The patient developed no complications during the procedure. A successful direct current cardioversion was performed at 200 joules with 1 attempt.  IMPRESSIONS  1. Left ventricular ejection fraction, by estimation, is 60 to 65%. The left ventricle has normal function. There is the interventricular septum is flattened in systole and diastole, consistent with right ventricular pressure and volume overload.  2. Right ventricular systolic function  is severely reduced. The right ventricular size is severely enlarged.  3. No left atrial/left atrial appendage thrombus was detected. The LAA emptying velocity was 51 cm/s.  4. Right atrial size was severely dilated.  5. The mitral valve is grossly normal. Trivial mitral valve regurgitation. No evidence of mitral stenosis.  6. The aortic valve is tricuspid. There is mild calcification of the aortic valve. Aortic valve regurgitation is not visualized. No aortic stenosis is present.  7. There is Moderate (Grade III) layered plaque involving the descending aorta. Conclusion(s)/Recommendation(s): No LA/LAA thrombus identified. Successful  cardioversion performed with restoration of normal sinus rhythm. FINDINGS  Left Ventricle: Left ventricular ejection fraction, by estimation, is 60 to 65%. The left ventricle has normal function. The left ventricular internal cavity size was small. The interventricular septum is flattened in systole and diastole, consistent with right ventricular pressure and volume overload. Right Ventricle: The right ventricular size is severely enlarged. No increase in right ventricular wall thickness. Right ventricular systolic function is severely reduced. Left Atrium: Left atrial size was normal in size. No left atrial/left atrial appendage thrombus was detected. The LAA emptying velocity was 51 cm/s. Right Atrium: Right atrial size was severely dilated. Pericardium: There is no evidence of pericardial effusion. Mitral Valve: The mitral valve is grossly normal. Trivial mitral valve regurgitation. No evidence of mitral valve stenosis. Tricuspid Valve: The tricuspid valve is grossly normal. Tricuspid valve regurgitation is mild . No evidence of tricuspid stenosis. Aortic Valve: The aortic valve is tricuspid. There is mild calcification of the aortic valve. Aortic valve regurgitation is not visualized. No aortic stenosis is present. Pulmonic Valve: The pulmonic valve was grossly normal. Pulmonic valve regurgitation is not visualized. No evidence of pulmonic stenosis. Aorta: The aortic root and ascending aorta are structurally normal, with no evidence of dilitation. There is moderate (Grade III) layered plaque involving the descending aorta. Venous: The left upper pulmonary vein is normal. IAS/Shunts: The atrial septum is grossly normal. Additional Comments: Spectral Doppler performed.  AORTA Ao Root diam: 3.71 cm Ao Asc diam:  2.74 cm Lennie Odor MD Electronically signed by Lennie Odor MD Signature Date/Time: 07/16/2023/12:27:53 PM    Final    EP STUDY  Result Date: 07/16/2023 See surgical note for result.   Cardiac  Studies     Patient Profile     JENIVA SWANIGAN is a 77 y.o. female with a hx of CAD (CABG 1996), COPD, HTN, HLD, Breast CA, Lung CA; who is being seen 07/13/2023 for the evaluation of syncope and volume overload at the request of Dolly Rias MD.   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  - possible RHC this admission once more euvolemic.   - BNP 2311, CXR small left effusion. CT evidence of ascites, generalized soft tissue edema.  - diuresis and medical therapy limited by soft bp's -gentle diuresis given soft bp's. Got lasix 20mg  x 1 yesterday. I/Os incomplete, documented of uop. Cr is trending down, recovering from prior AKI. SBP 110s-120s which are improved though she is on midodrine 10mg  tid, try higher dose of IV lasix today 40mg . Remains volume overloaded - reassess  diuretic dosing tomorrow, I have not scheduled daily doses.     2.Aflutter - unclear duration of aflutter - management limited by low bp's - she is on eliquis 5mg  bid - 07/16/23 TEE/DCCV to NSR - tele shows maintaining SR, though frequent PACs.  - remains on diogixin 0.125mg  daily  3. CAD - history of remote CABG - no acute issues this admission  For questions or updates, please contact [facility] Please consult [email or web address] for contact info under        Signed, Dina Rich, MD  07/17/2023, 11:02 AM

## 2023-07-17 NOTE — Progress Notes (Signed)
  Progress Note   Patient: Denise Mckenzie:[id number] DOB: [birth year] DOA: 07/12/2023     5 DOS: the patient was seen and examined on 07/17/2023   Brief hospital course: Assessment and Plan:   77 year old female with history of CAD, CABG in 1996, COPD, hypertension, hyperlipidemia, breast cancer, lung cancer transferred from [facility] be emergency department after having an episode of syncope.  Patient has been having worsening of the lower extremity edema, weight increase from 129 lb to 148 lb, worsening of the shortness of breath.  Patient hardly can walk across the room without experiencing shortness of breath.  Despite increasing the Lasix once a day to 2 times a day did not have any improvement. .  Workup in the emergency department patient was found to be in sinus rhythm  Congestive heart failure diastolic acute on chronic - cardiology is consulted  -continue with diuresis -echocardiogram showed severe right heart failure with RV dilatation -CTA chest negative for PE -plan to do right heart catheterization. -Underwent transesophageal echocardiogram-no left atrial thrombus, severe RV dilation/severe RV dysfunction.  Cardioverted A-fib, converted to normal sinus rhythm 11/29  COPD exacerbation -Continue to DuoNebs, Solu-Medrol -Patient has bilateral diffuse wheezing  Acute hypoxic, hypercarbic respiratory failure -ABG showed pH of 7.2, pCO2 of 75 -Off of BiPAP this morning. -Continue with oxygen through nasal cannula.  Severe pulmonary hypertension with right heart failure -Echocardiogram, transesophageal echocardiogram showed severe right heart failure with RV dilatation. -CTA chest negative for PE -Cardiology is following with the patient, plan for right heart catheterization  Atrial fibrillation with the RVR -continue with metoprolol -Starting the patient on digoxin -Starting the patient on Eliquis 5 mg twice daily -Cardioverted in A-fib 11/29  Pneumonia healthcare  associated - continue vancomycin, cefepime -Some improvement with the cough and congestion from yesterday  Acute kidney injury  -secondary to ATN from hypotension - keep systolic blood pressure greater than 100 mm Hg -added midodrine 5 mg 3 times daily.  Hyponatremia - hypervolemic hyponatremia - continue with diuresis and follow-up  Thrombocytopenia -Platelet count is trending down -Admitted with platelet count of 124->108->98   Subjective: Constipated for the last 5 days.  Denies any abdominal pain physical Exam: Vitals:   07/17/23 0800 07/17/23 0815 07/17/23 0900 07/17/23 0911  BP: 122/61  117/61   Pulse: (!) 114 91 88 91  Resp: 16 (!) 22 (!) 24 20  Temp: 98.1 F (36.7 C)     TempSrc: Oral     SpO2: 97% 97% 97% 98%  Weight:       Gen: Awake, alert, NAD   CV: Regular, tachycardic, normal S1, S2,.   Pericardial rub Resp: Bilateral wheezing, prolonged expiratory phase Abd: Obese, slightly distended, normoactive, nontender MSK: There is 3+ soft pitting edema up towards the knee then continues in a dependent fashion presacrally.  No significant edema in the upper extremities Skin: Excoriations and numerous healing scabs over the upper and lower extremities.  Additionally there is a faint petechial rash distally in the lower extremities which is nonpalpable/nontender.  Over the right knee there is increased warmth and erythema without induration.  Neuro: Alert and interactive  Psych: euthymic, appropriate  Data Reviewed: { Reviewed by me  Family Communication: Patient's grandchildren  Disposition: Status is: Inpatient   Planned Discharge Destination:  pending clinical improvement  Time spent:  40 minutes  Author: Susa Griffins, MD 07/17/2023 9:46 AM  For on call review [email or web address].

## 2023-07-17 NOTE — Progress Notes (Signed)
   07/17/23 2000  BiPAP/CPAP/SIPAP  $ Non-Invasive Ventilator  Non-Invasive Vent Subsequent  BiPAP/CPAP/SIPAP Pt Type Adult  BiPAP/CPAP/SIPAP SERVO  Reason BIPAP/CPAP not in use Non-compliant  Mask Type Full face mask  Mask Size Medium

## 2023-07-18 DIAGNOSIS — I5031 Acute diastolic (congestive) heart failure: Secondary | ICD-10-CM | POA: Diagnosis not present

## 2023-07-18 DIAGNOSIS — R601 Generalized edema: Secondary | ICD-10-CM | POA: Diagnosis not present

## 2023-07-18 LAB — BASIC METABOLIC PANEL
Anion gap: 9 (ref 5–15)
BUN: 25 mg/dL — ABNORMAL HIGH (ref 8–23)
CO2: 30 mmol/L (ref 22–32)
Calcium: 8.6 mg/dL — ABNORMAL LOW (ref 8.9–10.3)
Chloride: 99 mmol/L (ref 98–111)
Creatinine, Ser: 0.79 mg/dL (ref 0.44–1.00)
GFR, Estimated: >60 mL/min (ref >60)
Glucose, Bld: 127 mg/dL — ABNORMAL HIGH (ref 70–99)
Potassium: 4.5 mmol/L (ref 3.5–5.1)
Sodium: 138 mmol/L (ref 135–145)

## 2023-07-18 LAB — GLUCOSE, CAPILLARY
Glucose-Capillary: 119 mg/dL — ABNORMAL HIGH (ref 70–99)
Glucose-Capillary: 122 mg/dL — ABNORMAL HIGH (ref 70–99)
Glucose-Capillary: 122 mg/dL — ABNORMAL HIGH (ref 70–99)
Glucose-Capillary: 116 mg/dL — ABNORMAL HIGH (ref 70–99)

## 2023-07-18 LAB — MAGNESIUM: Magnesium: 2.4 mg/dL (ref 1.7–2.4)

## 2023-07-18 LAB — CULTURE, BLOOD (ROUTINE X 2)
Culture: NO GROWTH
Culture: NO GROWTH
Special Requests: ADEQUATE
Special Requests: ADEQUATE

## 2023-07-18 MED ORDER — MOMETASONE FURO-FORMOTEROL FUM 200-5 MCG/ACT IN AERO
2.0000 | INHALATION_SPRAY | Freq: Two times a day (BID) | RESPIRATORY_TRACT | Status: DC
  Administered 2023-07-18 – 2023-07-21 (×6): 2 via RESPIRATORY_TRACT
  Filled 2023-07-18: qty 8.8

## 2023-07-18 MED ORDER — FUROSEMIDE 10 MG/ML IJ SOLN
20.0000 mg | Freq: Once | INTRAMUSCULAR | Status: AC
  Administered 2023-07-18: 20 mg via INTRAVENOUS
  Filled 2023-07-18: qty 2

## 2023-07-18 MED ORDER — IPRATROPIUM-ALBUTEROL 0.5-2.5 (3) MG/3ML IN SOLN
3.0000 mL | RESPIRATORY_TRACT | Status: DC | PRN
  Administered 2023-07-18 – 2023-07-22 (×2): 3 mL via RESPIRATORY_TRACT
  Filled 2023-07-18: qty 3

## 2023-07-18 MED ORDER — FUROSEMIDE 10 MG/ML IJ SOLN
40.0000 mg | Freq: Once | INTRAMUSCULAR | Status: AC
  Administered 2023-07-18: 40 mg via INTRAVENOUS
  Filled 2023-07-18: qty 4

## 2023-07-18 NOTE — Evaluation (Signed)
Physical Therapy Evaluation Patient Details Name: Denise Mckenzie MRN: [id number] DOB: [birth year] Today's Date: 07/18/2023  History of Present Illness  Pt is a 77 yo female admitted 11/25 for syncope, decompensated HFpEF. 11/29 TEE DCCV. PMH: CAD, CABG in 1996, COPD, HTN, HLD, breast cancer, lung cancer  Clinical Impression  Pt pleasant and agreeable for mobility. Pt presenting with generalized weakness, decreased mobility, and decreased activity tolerance. Pt PLOF independent in all mobility without use of AD. Pt was in chair at beginning of session reported nursing utilized Steady to transfer from bed. Pt unable to fully elevate in seat with just UE use to shift in chair. Pt required minA +2 to block knee and RW for STS, completed 5xSTS transfers with assist, tolerated 1-37min standing in RW with standing march each trial. Pt fatigued quickly during general LE exercises. Was given and educated on use of flutter valve. Educated on benefits of further utilizing steady for transfers, nursing informed on recommendation. Pt would benefit from continued skilled acute physical therapy to maximize function and independence. Patient will benefit from continued inpatient follow up therapy, <3 hours/day         If plan is discharge home, recommend the following: Two people to help with walking and/or transfers;A little help with bathing/dressing/bathroom;Assistance with cooking/housework;Help with stairs or ramp for entrance;Assist for transportation   Can travel by private vehicle   No    Equipment Recommendations Wheelchair (measurements PT);Wheelchair cushion (measurements PT)  Recommendations for Other Services  OT consult    Functional Status Assessment Patient has had a recent decline in their functional status and demonstrates the ability to make significant improvements in function in a reasonable and predictable amount of time.     Precautions / Restrictions Precautions Precautions:  Fall Restrictions Weight Bearing Restrictions: No      Mobility  Bed Mobility               General bed mobility comments: In chair start of session    Transfers Overall transfer level: Needs assistance Equipment used: Rolling walker (2 wheels) Transfers: Sit to/from Stand Sit to Stand: Min assist, +2 physical assistance           General transfer comment: 5xSTS +2 assist, pt initiated lift minA for full upright and steadying when standing    Ambulation/Gait                  Stairs            Wheelchair Mobility     Tilt Bed    Modified Rankin (Stroke Patients Only)       Balance Overall balance assessment: Needs assistance Sitting-balance support: Feet supported, Feet unsupported Sitting balance-Leahy Scale: Fair     Standing balance support: Reliant on assistive device for balance, Bilateral upper extremity supported Standing balance-Leahy Scale: Poor Standing balance comment: reliant on RW and therapist due to knee buckling, tolerated standing with RW and support for 1-2 min at a time                             Pertinent Vitals/Pain Pain Assessment Pain Assessment: No/denies pain    Home Living Family/patient expects to be discharged to:: Private residence Living Arrangements: Spouse/significant other Available Help at Discharge: Family;Available PRN/intermittently Type of Home: House Home Access: Stairs to enter   Entrance Stairs-Number of Steps: 1   Home Layout: One level Home Equipment: Rollator (4 wheels);Shower seat  Prior Function Prior Level of Function : Independent/Modified Independent                     Extremity/Trunk Assessment   Upper Extremity Assessment Upper Extremity Assessment: Defer to OT evaluation    Lower Extremity Assessment Lower Extremity Assessment: Generalized weakness    Cervical / Trunk Assessment Cervical / Trunk Assessment: Kyphotic  Communication    Communication Communication: No apparent difficulties Cueing Techniques: Verbal cues;Tactile cues  Cognition Arousal: Alert Behavior During Therapy: WFL for tasks assessed/performed Overall Cognitive Status: Within Functional Limits for tasks assessed                                          General Comments      Exercises General Exercises - Lower Extremity Long Arc Quad: AROM, Both, 10 reps, Seated Hip Flexion/Marching: AROM, Both, 10 reps, Seated Other Exercises Other Exercises: 5XSTS with standing march   Assessment/Plan    PT Assessment Patient needs continued PT services  PT Problem List Decreased strength;Decreased activity tolerance;Decreased balance;Decreased mobility;Decreased knowledge of use of DME;Decreased range of motion       PT Treatment Interventions DME instruction;Gait training;Stair training;Functional mobility training;Therapeutic activities;Therapeutic exercise;Balance training;Patient/family education    PT Goals (Current goals can be found in the Care Plan section)  Acute Rehab PT Goals Patient Stated Goal: return home and get stronger PT Goal Formulation: With patient Time For Goal Achievement: 08/01/23 Potential to Achieve Goals: Good    Frequency Min 1X/week     Co-evaluation               AM-PAC PT "6 Clicks" Mobility  Outcome Measure Help needed turning from your back to your side while in a flat bed without using bedrails?: A Little Help needed moving from lying on your back to sitting on the side of a flat bed without using bedrails?: A Little Help needed moving to and from a bed to a chair (including a wheelchair)?: A Lot Help needed standing up from a chair using your arms (e.g., wheelchair or bedside chair)?: A Lot Help needed to walk in hospital room?: Total Help needed climbing 3-5 steps with a railing? : Total 6 Click Score: 12    End of Session Equipment Utilized During Treatment: Gait  belt;Oxygen Activity Tolerance: Patient tolerated treatment well Patient left: in chair;with call bell/phone within reach;with family/visitor present Nurse Communication: Mobility status;Need for lift equipment PT Visit Diagnosis: Unsteadiness on feet (R26.81);Other abnormalities of gait and mobility (R26.89);Muscle weakness (generalized) (M62.81);Repeated falls (R29.6)    Time: 1240-1305 PT Time Calculation (min) (ACUTE ONLY): 25 min   Charges:   PT Evaluation $PT Eval Moderate Complexity: 1 Mod PT Treatments $Therapeutic Activity: 8-22 mins PT General Charges $$ ACUTE PT VISIT: 1 Visit         Andrey Farmer. SPT Secure chat preferred   Darlin Drop 07/18/2023, 1:34 PM

## 2023-07-18 NOTE — NC FL2 (Signed)
[place] MEDICAID FL2 LEVEL OF CARE FORM     IDENTIFICATION  Patient Name: Denise Mckenzie Birthdate: [birth year] Sex: female Admission Date (Current Location): 07/12/2023  [facility] and [place] Number:  Producer, television/film/video and Address:  [facility] [place], [address] [place], [place] [address]      Provider Number: [id number]  Attending Physician Name and Address:  Noralee Stain, DO  Relative Name and Phone Number:  Tammy Sours [phone]    Current Level of Care: Hospital Recommended Level of Care: Nursing Facility Prior Approval Number:    Date Approved/Denied:   PASRR Number: [id number]  Discharge Plan: SNF    Current Diagnoses: Patient Active Problem List   Diagnosis Date Noted   Acute heart failure with preserved ejection fraction (HFpEF) (HCC) 07/15/2023   Acute right heart failure (HCC) 07/15/2023   Atrial flutter, paroxysmal (HCC) 07/15/2023   Hx of CABG 07/15/2023   Anasarca 07/12/2023   Acute exacerbation of CHF (congestive heart failure) (HCC) 07/12/2023   Chronic diastolic (congestive) heart failure (HCC) 07/22/2022   Arthritis 04/09/2021   COPD (chronic obstructive pulmonary disease) (HCC) 04/09/2021   Depression 04/09/2021   History of bronchitis 04/09/2021   History of pneumonia 04/09/2021   lung ca 04/09/2021   Personal history of radiation therapy 04/09/2021   Stress incontinence 04/09/2021   Uterine cancer (HCC) 04/09/2021   Vertigo 07/08/2020   Aortic atherosclerosis (HCC) 02/09/2018   Adrenal cortical adenoma, 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   Adrenal adenoma, left 08/19/2017   Hepatic steatosis 08/19/2017   Malignant neoplasm of upper-outer quadrant of left breast in female, estrogen receptor positive (HCC) 11/27/2015   Primary cancer of right upper lobe of lung (HCC) 11/27/2015   Hypothyroidism 10/22/2015   Osteoporosis 10/22/2015   Diabetes mellitus without complication (HCC) 10/22/2015   Full dentures 10/22/2015   Lung nodule  09/19/2015   Coronary artery disease    Hyperlipidemia    Hypertension    Myocardial infarction (HCC) 02/1994    Orientation RESPIRATION BLADDER Height & Weight     Self, Time, Situation, Place  O2 Incontinent Weight: 157 lb 13.6 oz (71.6 kg) Height:     BEHAVIORAL SYMPTOMS/MOOD NEUROLOGICAL BOWEL NUTRITION STATUS      Continent Diet (See DC summary)  AMBULATORY STATUS COMMUNICATION OF NEEDS Skin   Limited Assist Verbally Skin abrasions                       Personal Care Assistance Level of Assistance  Bathing, Feeding, Dressing Bathing Assistance: Limited assistance Feeding assistance: Independent Dressing Assistance: Limited assistance     Functional Limitations Info  Sight, Hearing, Speech Sight Info: Adequate Hearing Info: Adequate Speech Info: Adequate    SPECIAL CARE FACTORS FREQUENCY  PT (By licensed PT), OT (By licensed OT)     PT Frequency: 5x per week OT Frequency: 5x per week            Contractures Contractures Info: Not present    Additional Factors Info  Code Status, Allergies Code Status Info: Full Allergies Info: NKA           Current Medications (07/18/2023):  This is the current hospital active medication list Current Facility-Administered Medications  Medication Dose Route Frequency Provider Last Rate Last Admin   acetaminophen (TYLENOL) tablet 1,000 mg  1,000 mg Oral Q6H PRN O'Neal, Ronnald Ramp, MD       amitriptyline (ELAVIL) tablet 50 mg  50 mg Oral QHS Lennie Odor  Maisie Fus, MD   50 mg at 07/17/23 2120   apixaban (ELIQUIS) tablet 5 mg  5 mg Oral BID Sande Rives, MD   5 mg at 07/18/23 1002   aspirin EC tablet 81 mg  81 mg Oral Daily Sande Rives, MD   81 mg at 07/18/23 1002   atorvastatin (LIPITOR) tablet 40 mg  40 mg Oral QHS Sande Rives, MD   40 mg at 07/17/23 2120   cefTRIAXone (ROCEPHIN) 2 g in sodium chloride 0.9 % 100 mL IVPB  2 g Intravenous Q24H Susa Griffins, MD   Stopped at 07/17/23  1535   digoxin (LANOXIN) tablet 0.125 mg  0.125 mg Oral Daily Sande Rives, MD   0.125 mg at 07/18/23 1002   docusate sodium (COLACE) capsule 100 mg  100 mg Oral BID Susa Griffins, MD   100 mg at 07/18/23 1002   furosemide (LASIX) injection 40 mg  40 mg Intravenous Once Antoine Poche, MD       guaiFENesin (MUCINEX) 12 hr tablet 600 mg  600 mg Oral BID Sande Rives, MD   600 mg at 07/18/23 1002   hydrOXYzine (ATARAX) tablet 25 mg  25 mg Oral TID PRN Sande Rives, MD       insulin aspart (novoLOG) injection 0-6 Units  0-6 Units Subcutaneous TID WC Sande Rives, MD   1 Units at 07/17/23 1211   ipratropium-albuterol (DUONEB) 0.5-2.5 (3) MG/3ML nebulizer solution 3 mL  3 mL Nebulization Q4H PRN Noralee Stain, DO       lactulose (CHRONULAC) 10 GM/15ML solution 20 g  20 g Oral BID Sande Rives, MD   20 g at 07/18/23 1002   levothyroxine (SYNTHROID) tablet 88 mcg  88 mcg Oral Q0600 Sande Rives, MD   88 mcg at 07/18/23 0549   methylPREDNISolone sodium succinate (SOLU-MEDROL) 40 mg/mL injection 40 mg  40 mg Intravenous Q12H Sande Rives, MD   40 mg at 07/18/23 0326   midodrine (PROAMATINE) tablet 10 mg  10 mg Oral TID WC Sande Rives, MD   10 mg at 07/18/23 1322   mometasone-formoterol (DULERA) 200-5 MCG/ACT inhaler 2 puff  2 puff Inhalation BID Noralee Stain, DO   2 puff at 07/18/23 1049   sodium chloride flush (NS) 0.9 % injection 3 mL  3 mL Intravenous Q12H Sande Rives, MD   3 mL at 07/18/23 1002     Discharge Medications: Please see discharge summary for a list of discharge medications.  Relevant Imaging Results:  Relevant Lab Results:   Additional Information SSN# [id number] [place], [place]

## 2023-07-18 NOTE — TOC Initial Note (Signed)
Transition of Care [facility]) - Initial/Assessment Note    Patient Details  Name: Denise Mckenzie MRN: [id number] Date of Birth: May 26, 1946  [facility]) CM/SW Contact:    Patrice Paradise, LCSW Phone Number: 07/18/2023, 3:44 PM  Clinical Narrative:                 CSW met with patient to discuss PT recommendation of a SNF. Patient was aware of recommendation and in agreement with going to a ST SNF.  Patient asked CSW to call son Tammy Sours however was unable to reach him.Patient explained that she does want CSW to speak with son to keep him updated. CSW discussed the SNF process.CSW provided patient with medicare.gov rating list.  Patient gave CSW permission to fax referrals out to local facilities near her home.CSW answered questions about the SNF process and the next steps in the process.   TOC team will continue to assist with discharge planning needs.   Expected Discharge Plan: Skilled Nursing Facility Barriers to Discharge: SNF Pending bed offer, Insurance Authorization   Patient Goals and CMS Choice   CMS Medicare.gov Compare Post Acute Care list provided to:: Patient Choice offered to / list presented to : Patient      Expected Discharge Plan and Services       Living arrangements for the past 2 months: Single Family Home                                      Prior Living Arrangements/Services Living arrangements for the past 2 months: Single Family Home Lives with:: Spouse Patient language and need for interpreter reviewed:: Yes          Care giver support system in place?: Yes (comment)      Activities of Daily Living      Permission Sought/Granted      Share Information with NAME: Tammy Sours  Permission granted to share info w AGENCY: SNFs  Permission granted to share info w Relationship: Son  Permission granted to share info w Contact Information: [phone]  Emotional Assessment Appearance:: Appears stated age Attitude/Demeanor/Rapport: Engaged,  Gracious Affect (typically observed): Accepting, Adaptable Orientation: : Oriented to Self, Oriented to Place, Oriented to  Time, Oriented to Situation      Admission diagnosis:  Syncope and collapse [R55] Anasarca [R60.1] Elevated troponin [R79.89] Acute respiratory failure with hypoxia (HCC) [J96.01] AKI (acute kidney injury) (HCC) [N17.9] Acute exacerbation of CHF (congestive heart failure) (HCC) [I50.9] Patient Active Problem List   Diagnosis Date Noted   Acute heart failure with preserved ejection fraction (HFpEF) (HCC) 07/15/2023   Acute right heart failure (HCC) 07/15/2023   Atrial flutter, paroxysmal (HCC) 07/15/2023   Hx of CABG 07/15/2023   Anasarca 07/12/2023   Acute exacerbation of CHF (congestive heart failure) (HCC) 07/12/2023   Chronic diastolic (congestive) heart failure (HCC) 07/22/2022   Arthritis 04/09/2021   COPD (chronic obstructive pulmonary disease) (HCC) 04/09/2021   Depression 04/09/2021   History of bronchitis 04/09/2021   History of pneumonia 04/09/2021   lung ca 04/09/2021   Personal history of radiation therapy 04/09/2021   Stress incontinence 04/09/2021   Uterine cancer (HCC) 04/09/2021   Vertigo 07/08/2020   Aortic atherosclerosis (HCC) 02/09/2018   Adrenal cortical adenoma, 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   Adrenal adenoma, left 08/19/2017   Hepatic steatosis 08/19/2017   Malignant neoplasm of upper-outer quadrant of left breast in female, estrogen receptor positive (  HCC) 11/27/2015   Primary cancer of right upper lobe of lung (HCC) 11/27/2015   Hypothyroidism 10/22/2015   Osteoporosis 10/22/2015   Diabetes mellitus without complication (HCC) 10/22/2015   Full dentures 10/22/2015   Lung nodule 09/19/2015   Coronary artery disease    Hyperlipidemia    Hypertension    Myocardial infarction [facility]) 02/1994   PCP:  Burton Apley, MD Pharmacy:   [facility] [phone] - [place], [place] - [address] [place]  [address] Phone: [phone] Fax: [phone]  [facility] [address] [place] [address] Phone: [phone] Fax: [phone]     Social Determinants of Health (SDOH) Social History: SDOH Screenings   Food Insecurity: Food Insecurity Present (07/13/2023)  Housing: Low Risk  (07/13/2023)  Transportation Needs: No Transportation Needs (07/13/2023)  Utilities: Not At Risk (07/13/2023)  Tobacco Use: Medium Risk (07/16/2023)   SDOH Interventions:     Readmission Risk Interventions     No data to display

## 2023-07-18 NOTE — Progress Notes (Signed)
PROGRESS NOTE    Denise Mckenzie  UJW:[id number] DOB: 07-17-1946 DOA: 07/12/2023 PCP: Burton Apley, MD     Brief Narrative:  Denise Mckenzie is a 77 year old female with history of CAD, CABG in 1996, COPD, hypertension, hyperlipidemia, breast cancer, lung cancer transferred after having an episode of syncope.  Patient has been having worsening of the lower extremity edema, weight increase from 129 lb to 148 lb, worsening of the shortness of breath.  Patient hardly can walk across the room without experiencing shortness of breath.  Despite increasing the Lasix once a day to 2 times a day did not have any improvement. Workup in the emergency department patient was found to be in sinus rhythm.  Patient was admitted for CHF exacerbation.  Echocardiogram revealed severe right heart failure with RV dilation.  She was treated with IV diuresis.  Cardiology consulted.  Patient also underwent TEE/DCCV for A-fib and converted to normal sinus rhythm 11/29.  New events last 24 hours / Subjective: Patient feeling well overall, has on compression hose.  Continues to be somewhat short of breath.  Assessment & Plan:  Principal Problem:   Anasarca Active Problems:   Acute exacerbation of CHF (congestive heart failure) (HCC)   Acute heart failure with preserved ejection fraction (HFpEF) (HCC)   Acute right heart failure (HCC)   Atrial flutter, paroxysmal (HCC)   Hx of CABG   Acute on chronic diastolic heart failure, right heart failure Severe pulmonary hypertension with right heart failure -CTA chest negative for PE -Echocardiogram: LVEF 60-65%, no WMAs, grade I dd, D septum consistent with RV pressure and volume overload, severe RV dysfunction, PASP 46, severe RAE  -Cardiology consulted -Giving IV Lasix today again -Possible right heart cath -Daily weight, strict I's and O's  A-fib RVR -Status post TEE/DCCV 11/29, converted to normal sinus rhythm -Digoxin, Eliquis  COPD exacerbation -Solu-Medrol,  breathing treatment  HCAP -Rocephin  Acute hypoxic, hypercarbic respiratory failure -Doing well on 4 L nasal cannula O2 this morning -BiPAP nightly -Recommend outpatient sleep study  Hypotension -Midodrine  Hyperlipidemia -Lipitor  Hypothyroidism -Synthroid    DVT prophylaxis:  SCDs Start: 07/12/23 2114 apixaban (ELIQUIS) tablet 5 mg  Code Status: Full code Family Communication: At bedside Disposition Plan: SNF Status is: Inpatient Remains inpatient appropriate because: IV Lasix today    Antimicrobials:  Anti-infectives (From admission, onward)    Start     Dose/Rate Route Frequency Ordered Stop   07/16/23 1445  cefTRIAXone (ROCEPHIN) 2 g in sodium chloride 0.9 % 100 mL IVPB        2 g 200 mL/hr over 30 Minutes Intravenous Every 24 hours 07/16/23 1351 07/20/23 1444   07/16/23 0000  vancomycin (VANCOREADY) IVPB 750 mg/150 mL  Status:  Discontinued        750 mg 150 mL/hr over 60 Minutes Intravenous Every 24 hours 07/15/23 1237 07/16/23 1351   07/15/23 1400  ceFEPIme (MAXIPIME) 2 g in sodium chloride 0.9 % 100 mL IVPB  Status:  Discontinued        2 g 200 mL/hr over 30 Minutes Intravenous Every 12 hours 07/15/23 1233 07/16/23 1351   07/14/23 0200  vancomycin (VANCOREADY) IVPB 500 mg/100 mL  Status:  Discontinued        500 mg 100 mL/hr over 60 Minutes Intravenous Every 24 hours 07/13/23 0047 07/15/23 1237   07/13/23 0200  ceFEPIme (MAXIPIME) 2 g in sodium chloride 0.9 % 100 mL IVPB  Status:  Discontinued  2 g 200 mL/hr over 30 Minutes Intravenous Every 24 hours 07/13/23 0047 07/15/23 1233   07/13/23 0200  vancomycin (VANCOREADY) IVPB 1500 mg/300 mL        1,500 mg 150 mL/hr over 120 Minutes Intravenous  Once 07/13/23 0047 07/13/23 0412        Objective: Vitals:   07/18/23 1200 07/18/23 1216 07/18/23 1245 07/18/23 1315  BP:  138/66    Pulse: (!) 101 96 84 100  Resp: 20 (!) 23 (!) 22 (!) 24  Temp:  98 F (36.7 C)    TempSrc:  Axillary    SpO2: 99%  97% 96% 95%  Weight:        Intake/Output Summary (Last 24 hours) at 07/18/2023 1336 Last data filed at 07/18/2023 1230 Gross per 24 hour  Intake 168.96 ml  Output 1500 ml  Net -1331.04 ml   Filed Weights   07/15/23 0438 07/16/23 0500 07/18/23 0401  Weight: 70.3 kg 70 kg 71.6 kg    Examination:  General exam: Appears calm and comfortable  Respiratory system: Clear to auscultation. Respiratory effort normal. No respiratory distress. No conversational dyspnea.  Cardiovascular system: S1 & S2 heard, RRR. No murmurs.  Bilateral pitting pedal edema. Gastrointestinal system: Abdomen is nondistended, soft and nontender. Normal bowel sounds heard. Central nervous system: Alert and oriented. No focal neurological deficits. Speech clear.  Extremities: Symmetric in appearance  Skin: No rashes, lesions or ulcers on exposed skin  Psychiatry: Judgement and insight appear normal. Mood & affect appropriate.   Data Reviewed: I have personally reviewed following labs and imaging studies  CBC: Recent Labs  Lab 07/12/23 1434 07/13/23 0038 07/13/23 0341 07/17/23 0238  WBC 10.3  --  8.4 11.5*  NEUTROABS 8.7*  --   --   --   HGB 17.6* 17.4* 16.3* 15.4*  HCT 55.0* 54.6* 51.3* 50.2*  MCV 90.8  --  90.2 93.7  PLT 124*  --  108* 98*   Basic Metabolic Panel: Recent Labs  Lab 07/12/23 1434 07/13/23 0341 07/15/23 0244 07/17/23 0238 07/18/23 1018  NA 130* 131* 132* 138 138  K 5.7* 4.7 3.9 4.2 4.5  CL 94* 96* 96* 103 99  CO2 28 24 28 30 30   GLUCOSE 85 109* 144* 155* 127*  BUN 59* 49* 31* 27* 25*  CREATININE 1.52* 1.33* 1.01* 0.78 0.79  CALCIUM 9.2 8.0* 8.7* 8.9 8.6*  MG  --  2.0  --   --  2.4  PHOS  --  3.2  --   --   --    GFR: Estimated Creatinine Clearance: 52.8 mL/min (by C-G formula based on SCr of 0.79 mg/dL). Liver Function Tests: Recent Labs  Lab 07/12/23 1500  AST 38  ALT 33  ALKPHOS 102  BILITOT 1.0  PROT 6.8  ALBUMIN 3.9   No results for input(s): "LIPASE",  "AMYLASE" in the last 168 hours. No results for input(s): "AMMONIA" in the last 168 hours. Coagulation Profile: Recent Labs  Lab 07/13/23 0341  INR 1.2   Cardiac Enzymes: No results for input(s): "CKTOTAL", "CKMB", "CKMBINDEX", "TROPONINI" in the last 168 hours. BNP (last 3 results) Recent Labs    02/22/23 1136 07/02/23 1645 07/09/23 1131  PROBNP 5,414* 21,913* 33,547*   HbA1C: No results for input(s): "HGBA1C" in the last 72 hours. CBG: Recent Labs  Lab 07/17/23 1152 07/17/23 1612 07/17/23 2113 07/18/23 0636 07/18/23 1214  GLUCAP 181* 99 158* 119* 122*   Lipid Profile: No results for input(s): "CHOL", "HDL", "LDLCALC", "  TRIG", "CHOLHDL", "LDLDIRECT" in the last 72 hours. Thyroid Function Tests: No results for input(s): "TSH", "T4TOTAL", "FREET4", "T3FREE", "THYROIDAB" in the last 72 hours. Anemia Panel: No results for input(s): "VITAMINB12", "FOLATE", "FERRITIN", "TIBC", "IRON", "RETICCTPCT" in the last 72 hours. Sepsis Labs: Recent Labs  Lab 07/12/23 1540 07/13/23 0038  LATICACIDVEN 1.4 1.7    Recent Results (from the past 240 hour(s))  Resp panel by RT-PCR (RSV, Flu A&B, Covid) Anterior Nasal Swab     Status: None   Collection Time: 07/12/23  9:52 PM   Specimen: Anterior Nasal Swab  Result Value Ref Range Status   SARS Coronavirus 2 by RT PCR NEGATIVE NEGATIVE Final   Influenza A by PCR NEGATIVE NEGATIVE Final   Influenza B by PCR NEGATIVE NEGATIVE Final    Comment: (NOTE) The Xpert Xpress SARS-CoV-2/FLU/RSV plus assay is intended as an aid in the diagnosis of influenza from Nasopharyngeal swab specimens and should not be used as a sole basis for treatment. Nasal washings and aspirates are unacceptable for Xpert Xpress SARS-CoV-2/FLU/RSV testing.  Fact Sheet for Patients: BloggerCourse.com  Fact Sheet for Healthcare Providers: SeriousBroker.it  This test is not yet approved or cleared by the [place] FDA and has been authorized for detection and/or diagnosis of SARS-CoV-2 by FDA under an Emergency Use Authorization (EUA). This EUA will remain in effect (meaning this test can be used) for the duration of the COVID-19 declaration under Section 564(b)(1) of the Act, 21 U.S.C. section 360bbb-3(b)(1), unless the authorization is terminated or revoked.     Resp Syncytial Virus by PCR NEGATIVE NEGATIVE Final    Comment: (NOTE) Fact Sheet for Patients: BloggerCourse.com  Fact Sheet for Healthcare Providers: SeriousBroker.it  This test is not yet approved or cleared by the [place] FDA and has been authorized for detection and/or diagnosis of SARS-CoV-2 by FDA under an Emergency Use Authorization (EUA). This EUA will remain in effect (meaning this test can be used) for the duration of the COVID-19 declaration under Section 564(b)(1) of the Act, 21 U.S.C. section 360bbb-3(b)(1), unless the authorization is terminated or revoked.  Performed at [facility], [address], [place], [place] [address]   Culture, blood (Routine X 2) w Reflex to ID Panel     Status: None   Collection Time: 07/13/23 12:43 AM   Specimen: BLOOD RIGHT HAND  Result Value Ref Range Status   Specimen Description BLOOD RIGHT HAND  Final   Special Requests   Final    BOTTLES DRAWN AEROBIC AND ANAEROBIC Blood Culture adequate volume   Culture   Final    NO GROWTH 5 DAYS Performed at [facility], [address], [place], [place] [address]    Report Status 07/18/2023 FINAL  Final  Culture, blood (Routine X 2) w Reflex to ID Panel     Status: None   Collection Time: 07/13/23  3:37 AM   Specimen: BLOOD RIGHT HAND  Result Value Ref Range Status   Specimen Description BLOOD RIGHT HAND  Final   Special Requests   Final    BOTTLES DRAWN AEROBIC AND ANAEROBIC Blood Culture adequate volume   Culture   Final    NO GROWTH 5 DAYS Performed at  [facility], [address], [place], [place] [address]    Report Status 07/18/2023 FINAL  Final  MRSA Next Gen by PCR, Nasal     Status: None   Collection Time: 07/14/23 12:30 PM   Specimen: Nasal Mucosa; Nasal  Swab  Result Value Ref Range Status   MRSA by PCR Next Gen NOT DETECTED NOT DETECTED Final    Comment: (NOTE) The GeneXpert MRSA Assay (FDA approved for NASAL specimens only), is one component of a comprehensive MRSA colonization surveillance program. It is not intended to diagnose MRSA infection nor to guide or monitor treatment for MRSA infections. Test performance is not FDA approved in patients less than 59 years old. Performed at [facility], [address], [place], [place] [address]       Radiology Studies: No results found.    Scheduled Meds:  amitriptyline  50 mg Oral QHS   apixaban  5 mg Oral BID   aspirin EC  81 mg Oral Daily   atorvastatin  40 mg Oral QHS   digoxin  0.125 mg Oral Daily   docusate sodium  100 mg Oral BID   furosemide  40 mg Intravenous Once   guaiFENesin  600 mg Oral BID   insulin aspart  0-6 Units Subcutaneous TID WC   lactulose  20 g Oral BID   levothyroxine  88 mcg Oral Q0600   methylPREDNISolone (SOLU-MEDROL) injection  40 mg Intravenous Q12H   midodrine  10 mg Oral TID WC   mometasone-formoterol  2 puff Inhalation BID   sodium chloride flush  3 mL Intravenous Q12H   Continuous Infusions:  cefTRIAXone (ROCEPHIN)  IV Stopped (07/17/23 1535)     LOS: 6 days   Time spent: 35 minutes   Noralee Stain, DO [facility] 07/18/2023, 1:36 PM   Available via Epic secure chat 7am-7pm After these hours, please refer to coverage provider listed on [email or web address]

## 2023-07-18 NOTE — Progress Notes (Signed)
Mobility Specialist Progress Note:   07/18/23 1236  Mobility  Activity Transferred from bed to chair  Level of Assistance Maximum assist, patient does 25-49% (+2)  Assistive Device Stedy  Activity Response Tolerated fair  Mobility Referral Yes  $Mobility charge 1 Mobility  Mobility Specialist Start Time (ACUTE ONLY) 1200  Mobility Specialist Stop Time (ACUTE ONLY) 1225  Mobility Specialist Time Calculation (min) (ACUTE ONLY) 25 min   Pt received in bed, requesting to get OOB d/t discomfort per RN request. MaxA to get pt to EOB. Pt c/o R. Hip pain while sitting up limiting sitting tolerance. Pt requiring MinA assist to keep upright posture while sitting d/t weakness and anxiety from recent fall. C/o slight SOB,  pursed lip breathing encouraged. VSS throughout. MaxA+2 to stand in stedy. NT present to help with line management. Pt left in chair asymptomatic with RN and NT still in room.     Leory Plowman  Mobility Specialist Please contact via [employer] office at [phone]

## 2023-07-18 NOTE — Progress Notes (Signed)
Rounding Note    Patient Name: Denise Mckenzie Date of Encounter: 07/18/2023  [facility] Cardiologist: New  Subjective   Some ongoing SOB, edema  Inpatient Medications    Scheduled Meds:  amitriptyline  50 mg Oral QHS   apixaban  5 mg Oral BID   aspirin EC  81 mg Oral Daily   atorvastatin  40 mg Oral QHS   digoxin  0.125 mg Oral Daily   docusate sodium  100 mg Oral BID   furosemide  20 mg Intravenous Daily   guaiFENesin  600 mg Oral BID   insulin aspart  0-6 Units Subcutaneous TID WC   lactulose  20 g Oral BID   levothyroxine  88 mcg Oral Q0600   methylPREDNISolone (SOLU-MEDROL) injection  40 mg Intravenous Q12H   midodrine  10 mg Oral TID WC   mometasone-formoterol  2 puff Inhalation BID   sodium chloride flush  3 mL Intravenous Q12H   Continuous Infusions:  cefTRIAXone (ROCEPHIN)  IV Stopped (07/17/23 1535)   PRN Meds: acetaminophen, hydrOXYzine, ipratropium-albuterol   Vital Signs    Vitals:   07/18/23 0700 07/18/23 0730 07/18/23 0800 07/18/23 0842  BP:  (!) 114/101  119/63  Pulse: 88 95 92 95  Resp: 13 14 19 18   Temp:  (!) 97.2 F (36.2 C)    TempSrc:  Axillary    SpO2: 97% 98% 97% 96%  Weight:        Intake/Output Summary (Last 24 hours) at 07/18/2023 0934 Last data filed at 07/18/2023 0845 Gross per 24 hour  Intake 168.96 ml  Output 850 ml  Net -681.04 ml      07/18/2023    4:01 AM 07/16/2023    5:00 AM 07/15/2023    4:38 AM  Last 3 Weights  Weight (lbs) 157 lb 13.6 oz 154 lb 5.2 oz 154 lb 15.7 oz  Weight (kg) 71.6 kg 70 kg 70.3 kg      Telemetry    SR - Personally Reviewed  ECG    N/a - Personally Reviewed  Physical Exam  \ GEN: No acute distress.   Neck: + JVD Cardiac: RRR, no murmurs, rubs, or gallops.  Respiratory: expiratory wheezing GI: Soft, nontender, non-distended  MS: 2+ bilateral LE edema Neuro:  Nonfocal  Psych: Normal affect   Labs    High Sensitivity Troponin:   Recent Labs  Lab 07/12/23 1434  07/12/23 2059  TROPONINIHS 59* 57*     Chemistry Recent Labs  Lab 07/12/23 1500 07/13/23 0341 07/15/23 0244 07/17/23 0238  NA  --  131* 132* 138  K  --  4.7 3.9 4.2  CL  --  96* 96* 103  CO2  --  24 28 30   GLUCOSE  --  109* 144* 155*  BUN  --  49* 31* 27*  CREATININE  --  1.33* 1.01* 0.78  CALCIUM  --  8.0* 8.7* 8.9  MG  --  2.0  --   --   PROT 6.8  --   --   --   ALBUMIN 3.9  --   --   --   AST 38  --   --   --   ALT 33  --   --   --   ALKPHOS 102  --   --   --   BILITOT 1.0  --   --   --   GFRNONAA  --  41* 57* >60  ANIONGAP  --  11  8 5    Lipids  Recent Labs  Lab 07/13/23 0341  CHOL 91  TRIG 104  HDL 28*  LDLCALC 42  CHOLHDL 3.3    Hematology Recent Labs  Lab 07/12/23 1434 07/13/23 0038 07/13/23 0341 07/17/23 0238  WBC 10.3  --  8.4 11.5*  RBC 6.06*  --  5.69* 5.36*  HGB 17.6* 17.4* 16.3* 15.4*  HCT 55.0* 54.6* 51.3* 50.2*  MCV 90.8  --  90.2 93.7  MCH 29.0  --  28.6 28.7  MCHC 32.0  --  31.8 30.7  RDW 17.8*  --  16.7* 17.3*  PLT 124*  --  108* 98*   Thyroid  Recent Labs  Lab 07/13/23 0038 07/13/23 0341  TSH 0.321*  --   FREET4  --  0.97    BNP Recent Labs  Lab 07/12/23 1434 07/13/23 0038  BNP 2,311.4* 2,289.1*    DDimer No results for input(s): "DDIMER" in the last 168 hours.   Radiology    ECHO TEE  Result Date: 07/16/2023    TRANSESOPHOGEAL ECHO REPORT   Patient Name:   Denise Mckenzie Date of Exam: 07/16/2023 Medical Rec #:  [id number]    Height:       62.0 in Accession #:    [id number]   Weight:       154.3 lb Date of Birth:  [birth year]    BSA:          1.712 m Patient Age:    77 years     BP:           135/71 mmHg Patient Gender: F            HR:           125 bpm. Exam Location:  Inpatient Procedure: Transesophageal Echo, Cardiac Doppler and Color Doppler Indications:     Atrial Fibrillation  History:         Patient has prior history of Echocardiogram examinations, most                  recent 07/13/2023. CHF, CAD and Previous  Myocardial Infarction,                  Prior CABG, COPD, Arrythmias:Atrial Flutter and Tachycardia;                  Risk Factors:Hypertension, Diabetes and Dyslipidemia. Breast                  cancer (L) breast.  Sonographer:     Lucendia Herrlich RCS Referring Phys:  [id number] Ronnald Ramp O'NEAL Diagnosing Phys: Lennie Odor MD PROCEDURE: After discussion of the risks and benefits of a TEE, an informed consent was obtained from the patient. TEE procedure time was 8 minutes. The transesophogeal probe was passed without difficulty through the esophogus of the patient. Imaged were  obtained with the patient in a supine position. Sedation performed by different physician. The patient was monitored while under deep sedation. Anesthestetic sedation was provided intravenously by Anesthesiology: 50mg  of Propofol. Image quality was excellent. The patient developed no complications during the procedure. A successful direct current cardioversion was performed at 200 joules with 1 attempt.  IMPRESSIONS  1. Left ventricular ejection fraction, by estimation, is 60 to 65%. The left ventricle has normal function. There is the interventricular septum is flattened in systole and diastole, consistent with right ventricular pressure and volume overload.  2. Right ventricular systolic function is severely reduced. The right  ventricular size is severely enlarged.  3. No left atrial/left atrial appendage thrombus was detected. The LAA emptying velocity was 51 cm/s.  4. Right atrial size was severely dilated.  5. The mitral valve is grossly normal. Trivial mitral valve regurgitation. No evidence of mitral stenosis.  6. The aortic valve is tricuspid. There is mild calcification of the aortic valve. Aortic valve regurgitation is not visualized. No aortic stenosis is present.  7. There is Moderate (Grade III) layered plaque involving the descending aorta. Conclusion(s)/Recommendation(s): No LA/LAA thrombus identified. Successful  cardioversion performed with restoration of normal sinus rhythm. FINDINGS  Left Ventricle: Left ventricular ejection fraction, by estimation, is 60 to 65%. The left ventricle has normal function. The left ventricular internal cavity size was small. The interventricular septum is flattened in systole and diastole, consistent with right ventricular pressure and volume overload. Right Ventricle: The right ventricular size is severely enlarged. No increase in right ventricular wall thickness. Right ventricular systolic function is severely reduced. Left Atrium: Left atrial size was normal in size. No left atrial/left atrial appendage thrombus was detected. The LAA emptying velocity was 51 cm/s. Right Atrium: Right atrial size was severely dilated. Pericardium: There is no evidence of pericardial effusion. Mitral Valve: The mitral valve is grossly normal. Trivial mitral valve regurgitation. No evidence of mitral valve stenosis. Tricuspid Valve: The tricuspid valve is grossly normal. Tricuspid valve regurgitation is mild . No evidence of tricuspid stenosis. Aortic Valve: The aortic valve is tricuspid. There is mild calcification of the aortic valve. Aortic valve regurgitation is not visualized. No aortic stenosis is present. Pulmonic Valve: The pulmonic valve was grossly normal. Pulmonic valve regurgitation is not visualized. No evidence of pulmonic stenosis. Aorta: The aortic root and ascending aorta are structurally normal, with no evidence of dilitation. There is moderate (Grade III) layered plaque involving the descending aorta. Venous: The left upper pulmonary vein is normal. IAS/Shunts: The atrial septum is grossly normal. Additional Comments: Spectral Doppler performed.  AORTA Ao Root diam: 3.71 cm Ao Asc diam:  2.74 cm Lennie Odor MD Electronically signed by Lennie Odor MD Signature Date/Time: 07/16/2023/12:27:53 PM    Final    EP STUDY  Result Date: 07/16/2023 See surgical note for result.   Cardiac  Studies     Patient Profile     Denise Mckenzie is a 77 y.o. female with a hx of CAD (CABG 1996), COPD, HTN, HLD, Breast CA, Lung CA; who is being seen 07/13/2023 for the evaluation of syncope and volume overload at the request of Dolly Rias MD.   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  - possible RHC this admission once more euvolemic.    - BNP 2311, CXR small left effusion. CT evidence of ascites, generalized soft tissue edema.  - diuresis and medical therapy limited by soft bp's. Has been on midodrine 10mg  tid   -I/Os incomplete. Received IV lasix 40mg  yesterday. Weights appear inaccurate. Labs pending today, pending results likely redose IV lasix, she remains significantly volume overloaded. If stable renal function would try lasix 40mg  bid today.       2.Aflutter - unclear duration of aflutter -  management limited by low bp's - she is on eliquis 5mg  bid - 07/16/23 TEE/DCCV to NSR - tele shows maintaining SR, though frequent PACs.  - remains on diogixin 0.125mg  daily   3. CAD - history of remote CABG - no acute issues this admission    For questions or updates, please contact [facility] Please consult [email or web address] for contact info under        Signed, Dina Rich, MD  07/18/2023, 9:34 AM

## 2023-07-19 ENCOUNTER — Encounter (HOSPITAL_COMMUNITY): Payer: Self-pay | Admitting: Cardiovascular Disease

## 2023-07-19 DIAGNOSIS — I480 Paroxysmal atrial fibrillation: Secondary | ICD-10-CM | POA: Diagnosis not present

## 2023-07-19 DIAGNOSIS — I5032 Chronic diastolic (congestive) heart failure: Secondary | ICD-10-CM

## 2023-07-19 DIAGNOSIS — R601 Generalized edema: Secondary | ICD-10-CM | POA: Diagnosis not present

## 2023-07-19 LAB — GLUCOSE, CAPILLARY
Glucose-Capillary: 84 mg/dL (ref 70–99)
Glucose-Capillary: 161 mg/dL — ABNORMAL HIGH (ref 70–99)
Glucose-Capillary: 186 mg/dL — ABNORMAL HIGH (ref 70–99)
Glucose-Capillary: 183 mg/dL — ABNORMAL HIGH (ref 70–99)

## 2023-07-19 LAB — BASIC METABOLIC PANEL
Anion gap: 8 (ref 5–15)
BUN: 24 mg/dL — ABNORMAL HIGH (ref 8–23)
CO2: 32 mmol/L (ref 22–32)
Calcium: 8.2 mg/dL — ABNORMAL LOW (ref 8.9–10.3)
Chloride: 98 mmol/L (ref 98–111)
Creatinine, Ser: 0.8 mg/dL (ref 0.44–1.00)
GFR, Estimated: >60 mL/min (ref >60)
Glucose, Bld: 106 mg/dL — ABNORMAL HIGH (ref 70–99)
Potassium: 4.2 mmol/L (ref 3.5–5.1)
Sodium: 138 mmol/L (ref 135–145)

## 2023-07-19 LAB — ANA W/REFLEX IF POSITIVE: Anti Nuclear Antibody (ANA): NEGATIVE

## 2023-07-19 MED ORDER — AMIODARONE HCL IN DEXTROSE 360-4.14 MG/200ML-% IV SOLN: 30.0000 mg/h | INTRAVENOUS | Status: DC

## 2023-07-19 MED ORDER — MIDODRINE HCL 5 MG PO TABS
5.0000 mg | ORAL_TABLET | Freq: Three times a day (TID) | ORAL | Status: DC
  Administered 2023-07-19 – 2023-07-23 (×12): 5 mg via ORAL
  Filled 2023-07-19 (×13): qty 1

## 2023-07-19 MED ORDER — AMIODARONE HCL IN DEXTROSE 360-4.14 MG/200ML-% IV SOLN: 60.0000 mg/h | INTRAVENOUS | Status: DC

## 2023-07-19 MED ORDER — FUROSEMIDE 40 MG PO TABS
40.0000 mg | ORAL_TABLET | Freq: Two times a day (BID) | ORAL | Status: DC
  Administered 2023-07-19 – 2023-07-23 (×9): 40 mg via ORAL
  Filled 2023-07-19: qty 1
  Filled 2023-07-19: qty 2
  Filled 2023-07-19: qty 1
  Filled 2023-07-19: qty 2
  Filled 2023-07-19: qty 1
  Filled 2023-07-19: qty 2
  Filled 2023-07-19 (×3): qty 1

## 2023-07-19 MED ORDER — PREDNISONE 20 MG PO TABS
50.0000 mg | ORAL_TABLET | Freq: Every day | ORAL | Status: DC
  Administered 2023-07-19 – 2023-07-23 (×5): 50 mg via ORAL
  Filled 2023-07-19 (×5): qty 1

## 2023-07-19 MED ORDER — AMIODARONE HCL 200 MG PO TABS
400.0000 mg | ORAL_TABLET | Freq: Two times a day (BID) | ORAL | Status: DC
  Administered 2023-07-19 – 2023-07-23 (×8): 400 mg via ORAL
  Filled 2023-07-19 (×8): qty 2

## 2023-07-19 NOTE — Progress Notes (Addendum)
Occupational Therapy Treatment  Per RN request, OT assisted RN w/ pt transfer back to bed d/t pt fear of falling. Pt with some improvements noted w/ transfer using RW from this AM and no knee buckling noted. Educated pt that if fear of falling is limiting transfer abilities that nursing staff can use Stedy to maximize transfer confidence. Family at bedside. OT educated family to encourage pt to assist with task completion on her own (self feeding, assisting with UB ADL and repositioning self).     07/19/23 1300  OT Visit Information  Last OT Received On 07/19/23  Assistance Needed +2 (initial safety with transfers/chair follow for gait)  History of Present Illness Pt is a 77 yo female admitted 11/25 for syncope, decompensated HFpEF. 11/29 TEE DCCV. PMH: CAD, CABG in 1996, COPD, HTN, HLD, breast cancer, lung cancer  Precautions  Precautions Fall  Restrictions  Weight Bearing Restrictions No  Pain Assessment  Pain Assessment No/denies pain  Cognition  Arousal Alert  Behavior During Therapy WFL for tasks assessed/performed;Anxious  Overall Cognitive Status Within Functional Limits for tasks assessed  General Comments anxious with mobility at times, does need cues to attempt tasks before assist given; can do more than presented  Upper Extremity Assessment  Upper Extremity Assessment Generalized weakness;Right hand dominant  Lower Extremity Assessment  Lower Extremity Assessment Defer to PT evaluation  Vision- Assessment  Vision Assessment? No apparent visual deficits  ADL  Overall ADL's  Needs assistance/impaired  Toileting- Clothing Manipulation and Hygiene Maximal assistance;Sitting/lateral lean;Sit to/from stand  Toileting - Clothing Manipulation Details (indicate cue type and reason) peri care assist in standing d/t purewick malfunction  Bed Mobility  Overal bed mobility Needs Assistance  Bed Mobility Sit to Supine  Sit to supine Min assist  General bed mobility comments LE assist  back to bed  Transfers  Overall transfer level Needs assistance  Equipment used Rolling walker (2 wheels)  Transfers Sit to/from Stand;Bed to chair/wheelchair/BSC  Sit to Stand Mod assist;+2 safety/equipment  Bed to/from chair/wheelchair/BSC transfer type: Step pivot  Step pivot transfers Min assist;+2 safety/equipment;+2 physical assistance  General transfer comment Mod A x 2 for safety to stand with RW. Min A x 2 to step to bed with recliner w/ minor assist for RW mgmt. No knee buckling noted  Balance  Overall balance assessment Needs assistance  Sitting-balance support Feet supported;Feet unsupported  Sitting balance-Leahy Scale Fair  Standing balance support Reliant on assistive device for balance;Bilateral upper extremity supported  Standing balance-Leahy Scale Poor  General Comments  General comments (skin integrity, edema, etc.) Family at bedside, encouraged them to allow pt to manage tasks that she can do herself to maximize independence (feeding self, assisitng with UB ADLs, etc)  OT - End of Session  Equipment Utilized During Treatment Gait belt;Rolling walker (2 wheels)  Activity Tolerance Patient tolerated treatment well  Patient left in bed;with nursing/sitter in room;with family/visitor present  Nurse Communication Mobility status  OT Assessment/Plan  OT Visit Diagnosis Unsteadiness on feet (R26.81);Other abnormalities of gait and mobility (R26.89);Muscle weakness (generalized) (M62.81)  OT Frequency (ACUTE ONLY) Min 1X/week  Follow Up Recommendations Skilled nursing-short term rehab (<3 hours/day)  Patient can return home with the following A lot of help with walking and/or transfers;A lot of help with bathing/dressing/bathroom  OT Equipment None recommended by OT  AM-PAC OT "6 Clicks" Daily Activity Outcome Measure (Version 2)  Help from another person eating meals? 4  Help from another person taking care of personal grooming? 3  Help from another person toileting,  which includes using toliet, bedpan, or urinal? 2  Help from another person bathing (including washing, rinsing, drying)? 2  Help from another person to put on and taking off regular upper body clothing? 3  Help from another person to put on and taking off regular lower body clothing? 2  6 Click Score 16  Progressive Mobility  What is the highest level of mobility based on the progressive mobility assessment? Level 3 (Stands with assist) - Balance while standing  and cannot march in place  Activity Transferred from chair to bed  OT Goal Progression  Progress towards OT goals Progressing toward goals  Acute Rehab OT Goals  Patient Stated Goal get back to bed  OT Goal Formulation With patient  Time For Goal Achievement 08/02/23  Potential to Achieve Goals Good  ADL Goals  Pt Will Perform Lower Body Bathing with contact guard assist;sit to/from stand;sitting/lateral leans  Pt Will Perform Lower Body Dressing with contact guard assist;sitting/lateral leans;sit to/from stand  Pt Will Transfer to Toilet with contact guard assist;ambulating  OT Time Calculation  OT Start Time (ACUTE ONLY) 1219  OT Stop Time (ACUTE ONLY) 1231  OT Time Calculation (min) 12 min  OT General Charges  $OT Visit 1 Visit  OT Treatments  $Therapeutic Activity 8-22 mins

## 2023-07-19 NOTE — Evaluation (Signed)
Occupational Therapy Evaluation Patient Details Name: Denise Mckenzie MRN: [id number] DOB: Sep 29, 1945 Today's Date: 07/19/2023   History of Present Illness Pt is a 77 yo female admitted 11/25 for syncope, decompensated HFpEF. 11/29 TEE DCCV. PMH: CAD, CABG in 1996, COPD, HTN, HLD, breast cancer, lung cancer   Clinical Impression   PTA, pt reports complete Independent in all ADLs, IADLs and mobility without AD. Pt presents now with deficits in strength, standing balance and endurance. Overall, pt requires Min A for bed mobility, Mod A (x 2 for safety) to pivot with RW to recliner. Pt requires Min A for UB ADL and Mod A for LB ADLs. Educated re: importance of LE elevation to prevent edema, repositioning in recliner, IS/flutter valve use, AROM BLE in chair and encouragement to attempt tasks before assist being given (I.e. self feeding this AM). Patient will benefit from continued inpatient follow up therapy, <3 hours/day at DC as pt is below functional baseline.      If plan is discharge home, recommend the following: A lot of help with walking and/or transfers;A lot of help with bathing/dressing/bathroom    Functional Status Assessment  Patient has had a recent decline in their functional status and demonstrates the ability to make significant improvements in function in a reasonable and predictable amount of time.  Equipment Recommendations  None recommended by OT    Recommendations for Other Services       Precautions / Restrictions Precautions Precautions: Fall Restrictions Weight Bearing Restrictions: No      Mobility Bed Mobility Overal bed mobility: Needs Assistance Bed Mobility: Supine to Sit     Supine to sit: Min assist, HOB elevated, Used rails     General bed mobility comments: use of bed pad to scoot hips to EOB and light assist to bring LE to EOB    Transfers Overall transfer level: Needs assistance Equipment used: Rolling walker (2 wheels) Transfers: Sit to/from  Stand, Bed to chair/wheelchair/BSC Sit to Stand: Mod assist     Step pivot transfers: Mod assist, +2 safety/equipment     General transfer comment: Mod A for two stands for peri care and then for transfer to recliner. +2 for safety due to hx of knee buckling and assist for RW mgmt      Balance Overall balance assessment: Needs assistance Sitting-balance support: Feet supported, Feet unsupported Sitting balance-Leahy Scale: Fair     Standing balance support: Reliant on assistive device for balance, Bilateral upper extremity supported Standing balance-Leahy Scale: Poor                             ADL either performed or assessed with clinical judgement   ADL Overall ADL's : Needs assistance/impaired Eating/Feeding: Independent Eating/Feeding Details (indicate cue type and reason): on  initial OT attempt, pt reported "you have to help me eat". OT set up tray in front of pt but pt then able to demo opening utensils and opening  seasoning packet without assist. left to self feed breakfast until OT return for mobility. Grooming: Set up;Sitting   Upper Body Bathing: Minimal assistance;Sitting   Lower Body Bathing: Moderate assistance;Sitting/lateral leans;Sit to/from stand   Upper Body Dressing : Minimal assistance;Sitting   Lower Body Dressing: Moderate assistance;Sitting/lateral leans;Sit to/from stand Lower Body Dressing Details (indicate cue type and reason): balance assist and peri care assist in standing Toilet Transfer: Moderate assistance;+2 for safety/equipment;Stand-pivot;BSC/3in1;Rolling walker (2 wheels)   Toileting- Clothing Manipulation and Hygiene: Moderate  assistance;Sitting/lateral lean;Sit to/from stand               Vision Ability to See in Adequate Light: 0 Adequate Patient Visual Report: No change from baseline Vision Assessment?: No apparent visual deficits     Perception         Praxis         Pertinent Vitals/Pain Pain  Assessment Pain Assessment: No/denies pain     Extremity/Trunk Assessment Upper Extremity Assessment Upper Extremity Assessment: Generalized weakness;Right hand dominant   Lower Extremity Assessment Lower Extremity Assessment: Defer to PT evaluation   Cervical / Trunk Assessment Cervical / Trunk Assessment: Kyphotic   Communication Communication Communication: No apparent difficulties   Cognition Arousal: Alert Behavior During Therapy: WFL for tasks assessed/performed, Anxious Overall Cognitive Status: Within Functional Limits for tasks assessed                                 General Comments: anxious with mobility at times, does need cues to attempt tasks before assist given; can do more than presented     General Comments  O2 WFL on 3 L O2. pt wanted to sit in recliner with LE down - educated to only have LE down for approx 30 min before propping back up to prevent edema. Pt then reported sliding in chair - educated that we could put footrest up or pt can lean forward and use armrests to scoot back in chair - pt able to return demo repositioning in chair    Exercises     Shoulder Instructions      Home Living Family/patient expects to be discharged to:: Private residence Living Arrangements: Spouse/significant other Available Help at Discharge: Family;Available PRN/intermittently Type of Home: House Home Access: Stairs to enter [employer] of Steps: 1   Home Layout: One level     Bathroom Shower/Tub: Producer, television/film/video: Standard     Home Equipment: Rollator (4 wheels);Shower seat          Prior Functioning/Environment Prior Level of Function : Independent/Modified Independent                        OT Problem List: Decreased strength;Decreased activity tolerance;Impaired balance (sitting and/or standing);Decreased knowledge of use of DME or AE;Decreased safety awareness;Cardiopulmonary status limiting  activity      OT Treatment/Interventions: Self-care/ADL training;Therapeutic exercise;Energy conservation;DME and/or AE instruction;Therapeutic activities;Patient/family education;Balance training    OT Goals(Current goals can be found in the care plan section) Acute Rehab OT Goals Patient Stated Goal: regain strength OT Goal Formulation: With patient Time For Goal Achievement: 08/02/23 Potential to Achieve Goals: Good  OT Frequency: Min 1X/week    Co-evaluation              AM-PAC OT "6 Clicks" Daily Activity     Outcome Measure Help from another person eating meals?: None Help from another person taking care of personal grooming?: A Little Help from another person toileting, which includes using toliet, bedpan, or urinal?: A Lot Help from another person bathing (including washing, rinsing, drying)?: A Lot Help from another person to put on and taking off regular upper body clothing?: A Little Help from another person to put on and taking off regular lower body clothing?: A Lot 6 Click Score: 16   End of Session Equipment Utilized During Treatment: Gait belt;Rolling walker (2 wheels) Nurse Communication: Mobility status  Activity  Tolerance: Patient tolerated treatment well Patient left: in chair;with call bell/phone within reach;with chair alarm set;with nursing/sitter in room  OT Visit Diagnosis: Unsteadiness on feet (R26.81);Other abnormalities of gait and mobility (R26.89);Muscle weakness (generalized) (M62.81)                Time: 8416-6063 OT Time Calculation (min): 41 min Charges:  OT General Charges $OT Visit: 1 Visit OT Evaluation $OT Eval Low Complexity: 1 Low OT Treatments $Self Care/Home Management : 8-22 mins $Therapeutic Activity: 8-22 mins  Bradd Canary, OTR/L Acute Rehab Services Office: [phone]   Lorre Munroe 07/19/2023, 9:40 AM

## 2023-07-19 NOTE — Progress Notes (Signed)
   07/19/23 2200  BiPAP/CPAP/SIPAP  $ Non-Invasive Home Ventilator  Initial  BiPAP/CPAP/SIPAP Pt Type Adult  BiPAP/CPAP/SIPAP Resmed  Mask Type Full face mask  Mask Size Small  IPAP 12 cmH20  EPAP 6 cmH2O  Flow Rate 3 lpm  Patient Home Equipment No  Auto Titrate No

## 2023-07-19 NOTE — Progress Notes (Addendum)
Pt's HR running 140s. EKG performed. [facility] and cardiology PA notified and aware and Presented at the bedside. New  order received.    Lawson Radar, RN

## 2023-07-19 NOTE — TOC Progression Note (Signed)
Transition of Care [facility]) - Progression Note    Patient Details  Name: Denise Mckenzie MRN: [id number] Date of Birth: [birth year]  Transition [facility]) CM/SW Contact  Jonesha Tsuchiya A [place], [place] Phone Number: 07/19/2023, 2:00 PM  Clinical Narrative:     CSW met with pt and pt's son at bedside. They stated they wanted [facility] for SNF. CSW updated facility, bed availability to be determined. CSW to start insurance authorization once stable.  Expected Discharge Plan: Skilled Nursing Facility Barriers to Discharge: SNF Pending bed offer, Insurance Authorization  Expected Discharge Plan and Services       Living arrangements for the past 2 months: Single Family Home                                       Social Determinants of Health (SDOH) Interventions SDOH Screenings   Food Insecurity: Food Insecurity Present (07/13/2023)  Housing: Low Risk  (07/13/2023)  Transportation Needs: No Transportation Needs (07/13/2023)  Utilities: Not At Risk (07/13/2023)  Tobacco Use: Medium Risk (07/16/2023)    Readmission Risk Interventions     No data to display

## 2023-07-19 NOTE — Progress Notes (Signed)
Paged by RN patient converted to atrial flutter with VR 140s, now in Afib with rates in the 120s. BP 110 systolic. Given her need for midodrine and pulmonary status, options are limited. Case discussed with Dr. Eden Emms who recommends trail of 400 mg amiodarone BID for now.    Marcelino Duster, PA-C 07/19/2023, 3:23 PM [phone] [facility] [address] [place], [place] [address]

## 2023-07-19 NOTE — Progress Notes (Signed)
Rounding Note    Patient Name: Denise Mckenzie Date of Encounter: 07/19/2023  [facility] Cardiologist: Bing Matter  Subjective   No complaints eating breakfast Intolerant to CPAP Indicates need to be d/c to rehab  Inpatient Medications    Scheduled Meds:  amitriptyline  50 mg Oral QHS   apixaban  5 mg Oral BID   aspirin EC  81 mg Oral Daily   atorvastatin  40 mg Oral QHS   digoxin  0.125 mg Oral Daily   docusate sodium  100 mg Oral BID   guaiFENesin  600 mg Oral BID   insulin aspart  0-6 Units Subcutaneous TID WC   lactulose  20 g Oral BID   levothyroxine  88 mcg Oral Q0600   methylPREDNISolone (SOLU-MEDROL) injection  40 mg Intravenous Q12H   midodrine  10 mg Oral TID WC   mometasone-formoterol  2 puff Inhalation BID   sodium chloride flush  3 mL Intravenous Q12H   Continuous Infusions:  cefTRIAXone (ROCEPHIN)  IV 2 g (07/18/23 1609)   PRN Meds: acetaminophen, hydrOXYzine, ipratropium-albuterol   Vital Signs    Vitals:   07/18/23 1800 07/18/23 2040 07/19/23 0358 07/19/23 0553  BP:   130/70   Pulse: 93 93 88   Resp: 19 (!) 21 14   Temp:   98.3 F (36.8 C)   TempSrc:   Oral   SpO2: 99%  99%   Weight:    72.3 kg    Intake/Output Summary (Last 24 hours) at 07/19/2023 0757 Last data filed at 07/19/2023 0600 Gross per 24 hour  Intake 60 ml  Output 1800 ml  Net -1740 ml      07/19/2023    5:53 AM 07/18/2023    4:01 AM 07/16/2023    5:00 AM  Last 3 Weights  Weight (lbs) 159 lb 6.3 oz 157 lb 13.6 oz 154 lb 5.2 oz  Weight (kg) 72.3 kg 71.6 kg 70 kg      Telemetry    SR - Personally Reviewed  ECG    N/a - Personally Reviewed  Physical Exam  \ GEN: No acute distress.   Neck: + JVD Cardiac: RRR, no murmurs, rubs, or gallops.  Respiratory: expiratory wheezing GI: Soft, nontender, non-distended  MS: 2+ bilateral LE edema Neuro:  Nonfocal  Psych: Normal affect   Labs    High Sensitivity Troponin:   Recent Labs  Lab 07/12/23 1434  07/12/23 2059  TROPONINIHS 59* 57*     Chemistry Recent Labs  Lab 07/12/23 1500 07/13/23 0341 07/15/23 0244 07/17/23 0238 07/18/23 1018 07/19/23 0432  NA  --  131*   < > 138 138 138  K  --  4.7   < > 4.2 4.5 4.2  CL  --  96*   < > 103 99 98  CO2  --  24   < > 30 30 32  GLUCOSE  --  109*   < > 155* 127* 106*  BUN  --  49*   < > 27* 25* 24*  CREATININE  --  1.33*   < > 0.78 0.79 0.80  CALCIUM  --  8.0*   < > 8.9 8.6* 8.2*  MG  --  2.0  --   --  2.4  --   PROT 6.8  --   --   --   --   --   ALBUMIN 3.9  --   --   --   --   --  AST 38  --   --   --   --   --   ALT 33  --   --   --   --   --   ALKPHOS 102  --   --   --   --   --   BILITOT 1.0  --   --   --   --   --   GFRNONAA  --  41*   < > >60 >60 >60  ANIONGAP  --  11   < > 5 9 8    < > = values in this interval not displayed.    Lipids  Recent Labs  Lab 07/13/23 0341  CHOL 91  TRIG 104  HDL 28*  LDLCALC 42  CHOLHDL 3.3    Hematology Recent Labs  Lab 07/12/23 1434 07/13/23 0038 07/13/23 0341 07/17/23 0238  WBC 10.3  --  8.4 11.5*  RBC 6.06*  --  5.69* 5.36*  HGB 17.6* 17.4* 16.3* 15.4*  HCT 55.0* 54.6* 51.3* 50.2*  MCV 90.8  --  90.2 93.7  MCH 29.0  --  28.6 28.7  MCHC 32.0  --  31.8 30.7  RDW 17.8*  --  16.7* 17.3*  PLT 124*  --  108* 98*   Thyroid  Recent Labs  Lab 07/13/23 0038 07/13/23 0341  TSH 0.321*  --   FREET4  --  0.97    BNP Recent Labs  Lab 07/12/23 1434 07/13/23 0038  BNP 2,311.4* 2,289.1*    DDimer No results for input(s): "DDIMER" in the last 168 hours.   Radiology    No results found.  Cardiac Studies     Patient Profile     Denise Mckenzie is a 77 y.o. female with a hx of CAD (CABG 1996), COPD, HTN, HLD, Breast CA, Lung CA; who is being seen 07/13/2023 for the evaluation of syncope and volume overload at the request of Dolly Rias MD.   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  - Unable to d/c DOAC post [facility] so any right heart cath will need to be Arranged electively by Dr Ledora Bottcher    - BNP 2311, CXR small left effusion. CT evidence of ascites, generalized soft tissue edema.  - Lasix 40 mg bid oral   2.Aflutter - she is on eliquis 5mg  bid - 07/16/23 TEE/DCCV to [facility] - tele shows maintaining SR - remains on diogixin 0.125mg  daily   3. CAD - history of remote CABG - no acute issues this admission  Patient stable for d/c to rehab when bed available     For questions or updates, please contact [facility] Please consult [email or web address] for contact info under        Signed, Charlton Haws, MD  07/19/2023, 7:57 AM

## 2023-07-19 NOTE — Progress Notes (Signed)
PROGRESS NOTE    Denise Mckenzie  ZOX:[id number] DOB: August 07, 1946 DOA: 07/12/2023 PCP: Burton Apley, MD     Brief Narrative:  Denise Mckenzie is a 77 year old female with history of CAD, CABG in 1996, COPD, hypertension, hyperlipidemia, breast cancer, lung cancer transferred after having an episode of syncope.  Patient has been having worsening of the lower extremity edema, weight increase from 129 lb to 148 lb, worsening of the shortness of breath.  Patient hardly can walk across the room without experiencing shortness of breath.  Despite increasing the Lasix once a day to 2 times a day did not have any improvement. Workup in the emergency department patient was found to be in sinus rhythm.  Patient was admitted for CHF exacerbation.  Echocardiogram revealed severe right heart failure with RV dilation.  She was treated with IV diuresis.  Cardiology consulted.  Patient also underwent TEE/DCCV for A-fib and converted to normal sinus rhythm 11/29.  New events last 24 hours / Subjective: Thought she was having issues with her pure wick.  Breathing improving.  Assessment & Plan:  Principal Problem:   Anasarca Active Problems:   Acute exacerbation of CHF (congestive heart failure) (HCC)   Acute heart failure with preserved ejection fraction (HFpEF) (HCC)   Acute right heart failure (HCC)   Atrial flutter, paroxysmal (HCC)   Hx of CABG   Acute on chronic diastolic heart failure, right heart failure Severe pulmonary hypertension with right heart failure -CTA chest negative for PE -Echocardiogram: LVEF 60-65%, no WMAs, grade I dd, D septum consistent with RV pressure and volume overload, severe RV dysfunction, PASP 46, severe RAE  -Cardiology consulted -Possible right heart cath, to be arranged outpatient by Dr. Bing Matter -Daily weight, strict I's and O's -Lasix changed to 40 mg twice daily p.o.  A-fib RVR -Status post TEE/DCCV 11/29, converted to normal sinus rhythm -Digoxin, Eliquis  COPD  exacerbation -Solu-Medrol --> prednisone, breathing treatment  HCAP -Rocephin  Acute hypoxic, hypercarbic respiratory failure -Doing well on 3 L nasal cannula O2 this morning -BiPAP nightly -Recommend outpatient sleep study  Hypotension -Midodrine  Hyperlipidemia -Lipitor  Hypothyroidism -Synthroid    DVT prophylaxis:  SCDs Start: 07/12/23 2114 apixaban (ELIQUIS) tablet 5 mg  Code Status: Full code Family Communication: None at bedside Disposition Plan: SNF Status is: Inpatient Remains inpatient appropriate because: SNF placement pending   Antimicrobials:  Anti-infectives (From admission, onward)    Start     Dose/Rate Route Frequency Ordered Stop   07/16/23 1445  cefTRIAXone (ROCEPHIN) 2 g in sodium chloride 0.9 % 100 mL IVPB        2 g 200 mL/hr over 30 Minutes Intravenous Every 24 hours 07/16/23 1351 07/20/23 1444   07/16/23 0000  vancomycin (VANCOREADY) IVPB 750 mg/150 mL  Status:  Discontinued        750 mg 150 mL/hr over 60 Minutes Intravenous Every 24 hours 07/15/23 1237 07/16/23 1351   07/15/23 1400  ceFEPIme (MAXIPIME) 2 g in sodium chloride 0.9 % 100 mL IVPB  Status:  Discontinued        2 g 200 mL/hr over 30 Minutes Intravenous Every 12 hours 07/15/23 1233 07/16/23 1351   07/14/23 0200  vancomycin (VANCOREADY) IVPB 500 mg/100 mL  Status:  Discontinued        500 mg 100 mL/hr over 60 Minutes Intravenous Every 24 hours 07/13/23 0047 07/15/23 1237   07/13/23 0200  ceFEPIme (MAXIPIME) 2 g in sodium chloride 0.9 % 100 mL IVPB  Status:  Discontinued        2 g 200 mL/hr over 30 Minutes Intravenous Every 24 hours 07/13/23 0047 07/15/23 1233   07/13/23 0200  vancomycin (VANCOREADY) IVPB 1500 mg/300 mL        1,500 mg 150 mL/hr over 120 Minutes Intravenous  Once 07/13/23 0047 07/13/23 0412        Objective: Vitals:   07/19/23 0358 07/19/23 0553 07/19/23 0757 07/19/23 1132  BP: 130/70  123/66 127/64  Pulse: 88  93 99  Resp: 14  (!) 22 20  Temp: 98.3  F (36.8 C)  98.6 F (37 C) 98.6 F (37 C)  TempSrc: Oral  Oral Oral  SpO2: 99%  96% 94%  Weight:  72.3 kg      Intake/Output Summary (Last 24 hours) at 07/19/2023 1235 Last data filed at 07/19/2023 1100 Gross per 24 hour  Intake 600 ml  Output 1100 ml  Net -500 ml   Filed Weights   07/16/23 0500 07/18/23 0401 07/19/23 0553  Weight: 70 kg 71.6 kg 72.3 kg    Examination:  General exam: Appears calm and comfortable  Respiratory system: Clear to auscultation. Respiratory effort normal. No respiratory distress. No conversational dyspnea.  On nasal cannula O2 Cardiovascular system: S1 & S2 heard, RRR. No murmurs.  Bilateral pitting pedal edema, TED hose in place Gastrointestinal system: Abdomen is nondistended, soft and nontender. Normal bowel sounds heard. Central nervous system: Alert and oriented. No focal neurological deficits. Speech clear.  Extremities: Symmetric in appearance  Skin: No rashes, lesions or ulcers on exposed skin  Psychiatry: Judgement and insight appear normal. Mood & affect appropriate.   Data Reviewed: I have personally reviewed following labs and imaging studies  CBC: Recent Labs  Lab 07/12/23 1434 07/13/23 0038 07/13/23 0341 07/17/23 0238  WBC 10.3  --  8.4 11.5*  NEUTROABS 8.7*  --   --   --   HGB 17.6* 17.4* 16.3* 15.4*  HCT 55.0* 54.6* 51.3* 50.2*  MCV 90.8  --  90.2 93.7  PLT 124*  --  108* 98*   Basic Metabolic Panel: Recent Labs  Lab 07/13/23 0341 07/15/23 0244 07/17/23 0238 07/18/23 1018 07/19/23 0432  NA 131* 132* 138 138 138  K 4.7 3.9 4.2 4.5 4.2  CL 96* 96* 103 99 98  CO2 24 28 30 30  32  GLUCOSE 109* 144* 155* 127* 106*  BUN 49* 31* 27* 25* 24*  CREATININE 1.33* 1.01* 0.78 0.79 0.80  CALCIUM 8.0* 8.7* 8.9 8.6* 8.2*  MG 2.0  --   --  2.4  --   PHOS 3.2  --   --   --   --    GFR: Estimated Creatinine Clearance: 53.1 mL/min (by C-G formula based on SCr of 0.8 mg/dL). Liver Function Tests: Recent Labs  Lab 07/12/23 1500   AST 38  ALT 33  ALKPHOS 102  BILITOT 1.0  PROT 6.8  ALBUMIN 3.9   No results for input(s): "LIPASE", "AMYLASE" in the last 168 hours. No results for input(s): "AMMONIA" in the last 168 hours. Coagulation Profile: Recent Labs  Lab 07/13/23 0341  INR 1.2   Cardiac Enzymes: No results for input(s): "CKTOTAL", "CKMB", "CKMBINDEX", "TROPONINI" in the last 168 hours. BNP (last 3 results) Recent Labs    02/22/23 1136 07/02/23 1645 07/09/23 1131  PROBNP 5,414* 21,913* 33,547*   HbA1C: No results for input(s): "HGBA1C" in the last 72 hours. CBG: Recent Labs  Lab 07/18/23 1214 07/18/23 1539 07/18/23 2114 07/19/23 0865  07/19/23 1133  GLUCAP 122* 122* 116* 84 161*   Lipid Profile: No results for input(s): "CHOL", "HDL", "LDLCALC", "TRIG", "CHOLHDL", "LDLDIRECT" in the last 72 hours. Thyroid Function Tests: No results for input(s): "TSH", "T4TOTAL", "FREET4", "T3FREE", "THYROIDAB" in the last 72 hours. Anemia Panel: No results for input(s): "VITAMINB12", "FOLATE", "FERRITIN", "TIBC", "IRON", "RETICCTPCT" in the last 72 hours. Sepsis Labs: Recent Labs  Lab 07/12/23 1540 07/13/23 0038  LATICACIDVEN 1.4 1.7    Recent Results (from the past 240 hour(s))  Resp panel by RT-PCR (RSV, Flu A&B, Covid) Anterior Nasal Swab     Status: None   Collection Time: 07/12/23  9:52 PM   Specimen: Anterior Nasal Swab  Result Value Ref Range Status   SARS Coronavirus 2 by RT PCR NEGATIVE NEGATIVE Final   Influenza A by PCR NEGATIVE NEGATIVE Final   Influenza B by PCR NEGATIVE NEGATIVE Final    Comment: (NOTE) The Xpert Xpress SARS-CoV-2/FLU/RSV plus assay is intended as an aid in the diagnosis of influenza from Nasopharyngeal swab specimens and should not be used as a sole basis for treatment. Nasal washings and aspirates are unacceptable for Xpert Xpress SARS-CoV-2/FLU/RSV testing.  Fact Sheet for Patients: BloggerCourse.com  Fact Sheet for Healthcare  Providers: SeriousBroker.it  This test is not yet approved or cleared by the [place] FDA and has been authorized for detection and/or diagnosis of SARS-CoV-2 by FDA under an Emergency Use Authorization (EUA). This EUA will remain in effect (meaning this test can be used) for the duration of the COVID-19 declaration under Section 564(b)(1) of the Act, 21 U.S.C. section 360bbb-3(b)(1), unless the authorization is terminated or revoked.     Resp Syncytial Virus by PCR NEGATIVE NEGATIVE Final    Comment: (NOTE) Fact Sheet for Patients: BloggerCourse.com  Fact Sheet for Healthcare Providers: SeriousBroker.it  This test is not yet approved or cleared by the [place] FDA and has been authorized for detection and/or diagnosis of SARS-CoV-2 by FDA under an Emergency Use Authorization (EUA). This EUA will remain in effect (meaning this test can be used) for the duration of the COVID-19 declaration under Section 564(b)(1) of the Act, 21 U.S.C. section 360bbb-3(b)(1), unless the authorization is terminated or revoked.  Performed at [facility], [address], [place], [place] [address]   Culture, blood (Routine X 2) w Reflex to ID Panel     Status: None   Collection Time: 07/13/23 12:43 AM   Specimen: BLOOD RIGHT HAND  Result Value Ref Range Status   Specimen Description BLOOD RIGHT HAND  Final   Special Requests   Final    BOTTLES DRAWN AEROBIC AND ANAEROBIC Blood Culture adequate volume   Culture   Final    NO GROWTH 5 DAYS Performed at [facility], [address] [place], [place] [address]    Report Status 07/18/2023 FINAL  Final  Culture, blood (Routine X 2) w Reflex to ID Panel     Status: None   Collection Time: 07/13/23  3:37 AM   Specimen: BLOOD RIGHT HAND  Result Value Ref Range Status   Specimen Description BLOOD RIGHT HAND  Final   Special Requests   Final    BOTTLES  DRAWN AEROBIC AND ANAEROBIC Blood Culture adequate volume   Culture   Final    NO GROWTH 5 DAYS Performed at [facility], [address] [place], [place] [address]    Report Status 07/18/2023 FINAL  Final  MRSA Next Gen by PCR,  Nasal     Status: None   Collection Time: 07/14/23 12:30 PM   Specimen: Nasal Mucosa; Nasal Swab  Result Value Ref Range Status   MRSA by PCR Next Gen NOT DETECTED NOT DETECTED Final    Comment: (NOTE) The GeneXpert MRSA Assay (FDA approved for NASAL specimens only), is one component of a comprehensive MRSA colonization surveillance program. It is not intended to diagnose MRSA infection nor to guide or monitor treatment for MRSA infections. Test performance is not FDA approved in patients less than 48 years old. Performed at [facility], [address], [place], [place] [address]       Radiology Studies: No results found.    Scheduled Meds:  amitriptyline  50 mg Oral QHS   apixaban  5 mg Oral BID   aspirin EC  81 mg Oral Daily   atorvastatin  40 mg Oral QHS   digoxin  0.125 mg Oral Daily   docusate sodium  100 mg Oral BID   furosemide  40 mg Oral BID   guaiFENesin  600 mg Oral BID   insulin aspart  0-6 Units Subcutaneous TID WC   lactulose  20 g Oral BID   levothyroxine  88 mcg Oral Q0600   midodrine  5 mg Oral TID WC   mometasone-formoterol  2 puff Inhalation BID   predniSONE  50 mg Oral Q breakfast   sodium chloride flush  3 mL Intravenous Q12H   Continuous Infusions:  cefTRIAXone (ROCEPHIN)  IV 2 g (07/18/23 1609)     LOS: 7 days   Time spent: 25 minutes   Noralee Stain, DO [facility] 07/19/2023, 12:35 PM   Available via Epic secure chat 7am-7pm After these hours, please refer to coverage provider listed on [email or web address]

## 2023-07-19 NOTE — Plan of Care (Signed)

## 2023-07-19 NOTE — Discharge Instructions (Signed)

## 2023-07-19 NOTE — TOC Progression Note (Signed)
Transition of Care [facility]) - Progression Note    Patient Details  Name: Denise Mckenzie MRN: [id number] Date of Birth: Apr 29, 1946  Transition of Care [facility]) CM/SW Contact  Akyia Borelli A [place], [place] Phone Number: 07/19/2023, 2:34 PM  Clinical Narrative:     Pt  medically stable for DC. CSW started authorization  request. Status pending.  Auth ID [id number]   Expected Discharge Plan: Skilled Nursing Facility Barriers to Discharge: SNF Pending bed offer, Insurance Authorization  Expected Discharge Plan and Services       Living arrangements for the past 2 months: Single Family Home                                       Social Determinants of Health (SDOH) Interventions SDOH Screenings   Food Insecurity: Food Insecurity Present (07/13/2023)  Housing: Low Risk  (07/13/2023)  Transportation Needs: No Transportation Needs (07/13/2023)  Utilities: Not At Risk (07/13/2023)  Tobacco Use: Medium Risk (07/16/2023)    Readmission Risk Interventions     No data to display

## 2023-07-20 DIAGNOSIS — I480 Paroxysmal atrial fibrillation: Secondary | ICD-10-CM | POA: Diagnosis not present

## 2023-07-20 DIAGNOSIS — R601 Generalized edema: Secondary | ICD-10-CM | POA: Diagnosis not present

## 2023-07-20 LAB — BASIC METABOLIC PANEL
Anion gap: 6 (ref 5–15)
BUN: 22 mg/dL (ref 8–23)
CO2: 35 mmol/L — ABNORMAL HIGH (ref 22–32)
Calcium: 7.9 mg/dL — ABNORMAL LOW (ref 8.9–10.3)
Chloride: 100 mmol/L (ref 98–111)
Creatinine, Ser: 0.59 mg/dL (ref 0.44–1.00)
GFR, Estimated: >60 mL/min (ref >60)
Glucose, Bld: 139 mg/dL — ABNORMAL HIGH (ref 70–99)
Potassium: 3.4 mmol/L — ABNORMAL LOW (ref 3.5–5.1)
Sodium: 141 mmol/L (ref 135–145)

## 2023-07-20 LAB — GLUCOSE, CAPILLARY
Glucose-Capillary: 117 mg/dL — ABNORMAL HIGH (ref 70–99)
Glucose-Capillary: 118 mg/dL — ABNORMAL HIGH (ref 70–99)
Glucose-Capillary: 154 mg/dL — ABNORMAL HIGH (ref 70–99)
Glucose-Capillary: 161 mg/dL — ABNORMAL HIGH (ref 70–99)

## 2023-07-20 LAB — DIGOXIN LEVEL: Digoxin Level: 0.4 ng/mL — ABNORMAL LOW (ref 0.8–2.0)

## 2023-07-20 MED ORDER — DILTIAZEM HCL 30 MG PO TABS
30.0000 mg | ORAL_TABLET | Freq: Three times a day (TID) | ORAL | Status: DC
  Administered 2023-07-20 – 2023-07-23 (×9): 30 mg via ORAL
  Filled 2023-07-20 (×9): qty 1

## 2023-07-20 MED ORDER — POTASSIUM CHLORIDE CRYS ER 20 MEQ PO TBCR
40.0000 meq | EXTENDED_RELEASE_TABLET | Freq: Once | ORAL | Status: AC
  Administered 2023-07-20: 40 meq via ORAL
  Filled 2023-07-20: qty 2

## 2023-07-20 NOTE — Progress Notes (Addendum)
Physical Therapy Treatment Patient Details Name: Denise Mckenzie MRN: [id number] DOB: Dec 01, 1945 Today's Date: 07/20/2023   History of Present Illness Pt is a 77 yo female admitted 11/25 for syncope, decompensated HFpEF. 11/29 TEE DCCV. PMH: CAD, CABG in 1996, COPD, HTN, HLD, breast cancer, lung cancer    PT Comments  Pt pleasant and agreeable to mobility. Pt still requiring assistance with all mobility. Requires increased time for all transfers. Pt tolerated initiation of steps, ambulating short distance in room with chair follow. Pt limited by fatigue and weakness in LE. Encouraged to alert nursing for assistance to [facility].   HR: 114-154 SPO2% >95% on 2L O2 via    If plan is discharge home, recommend the following: Two people to help with walking and/or transfers;A little help with bathing/dressing/bathroom;Assistance with cooking/housework;Help with stairs or ramp for entrance;Assist for transportation   Can travel by private vehicle     No  Equipment Recommendations  Wheelchair (measurements PT);Wheelchair cushion (measurements PT)    Recommendations for Other Services       Precautions / Restrictions Precautions Precautions: Fall Restrictions Weight Bearing Restrictions: No     Mobility  Bed Mobility Overal bed mobility: Needs Assistance Bed Mobility: Supine to Sit     Supine to sit: Min assist, HOB elevated, Used rails     General bed mobility comments: assist for LE OOB and elevating trunk    Transfers Overall transfer level: Needs assistance Equipment used: Rolling walker (2 wheels) Transfers: Sit to/from Stand, Bed to chair/wheelchair/BSC Sit to Stand: +2 physical assistance, +2 safety/equipment, Min assist   Step pivot transfers: Min assist, +2 safety/equipment, +2 physical assistance       General transfer comment: MinA x2 to stand, assist to steady and achieve upright posture, short shuffles to recliner. No knee buckling noted     Ambulation/Gait Ambulation/Gait assistance: Mod assist Gait Distance (Feet): 6 Feet Assistive device: Rolling walker (2 wheels) Gait Pattern/deviations: Step-to pattern, Decreased step length - right, Decreased step length - left, Decreased stride length, Narrow base of support, Trunk flexed   Gait velocity interpretation: <1.31 ft/sec, indicative of household ambulator   General Gait Details: very short steps, constant cueing for facilitation of ambulating, 34ft then required seated break, 62ft then required seated break   Stairs             Wheelchair Mobility     Tilt Bed    Modified Rankin (Stroke Patients Only)       Balance Overall balance assessment: Needs assistance Sitting-balance support: Feet supported, Feet unsupported Sitting balance-Leahy Scale: Fair     Standing balance support: Reliant on assistive device for balance, Bilateral upper extremity supported Standing balance-Leahy Scale: Poor                              Cognition Arousal: Alert Behavior During Therapy: WFL for tasks assessed/performed Overall Cognitive Status: Within Functional Limits for tasks assessed                                          Exercises General Exercises - Lower Extremity Quad Sets: AROM, Both, 10 reps, Supine Long Arc Quad: AROM, Both, 10 reps, Seated Heel Slides: AAROM, Both, 10 reps, Supine    General Comments        Pertinent Vitals/Pain Pain Assessment Pain Assessment: No/denies pain  Home Living Family/patient expects to be discharged to:: Skilled nursing facility Living Arrangements: Alone                      Prior Function            PT Goals (current goals can now be found in the care plan section) Progress towards PT goals: Progressing toward goals    Frequency    Min 1X/week      PT Plan      Co-evaluation              AM-PAC PT "6 Clicks" Mobility   Outcome Measure  Help  needed turning from your back to your side while in a flat bed without using bedrails?: A Little Help needed moving from lying on your back to sitting on the side of a flat bed without using bedrails?: A Little Help needed moving to and from a bed to a chair (including a wheelchair)?: A Lot Help needed standing up from a chair using your arms (e.g., wheelchair or bedside chair)?: A Lot Help needed to walk in hospital room?: Total Help needed climbing 3-5 steps with a railing? : Total 6 Click Score: 12    End of Session Equipment Utilized During Treatment: Gait belt;Oxygen Activity Tolerance: Patient tolerated treatment well Patient left: in chair;with call bell/phone within reach;with family/visitor present;with chair alarm set Nurse Communication: Mobility status PT Visit Diagnosis: Unsteadiness on feet (R26.81);Other abnormalities of gait and mobility (R26.89);Muscle weakness (generalized) (M62.81);Repeated falls (R29.6)     Time: 1015-1050 PT Time Calculation (min) (ACUTE ONLY): 35 min  Charges:    $Therapeutic Activity: 23-37 mins PT General Charges $$ ACUTE PT VISIT: 1 Visit                     Andrey Farmer. SPT Secure chat preferred    Darlin Drop 07/20/2023, 2:12 PM

## 2023-07-20 NOTE — Progress Notes (Signed)
Mobility Specialist Progress Note:   07/20/23 1206  Mobility  Activity Stood at bedside  Level of Assistance Minimal assist, patient does 75% or more (+2)  Assistive Device Front wheel walker  Activity Response Tolerated well  Mobility Referral Yes  $Mobility charge 1 Mobility  Mobility Specialist Start Time (ACUTE ONLY) 1130  Mobility Specialist Stop Time (ACUTE ONLY) 1145  Mobility Specialist Time Calculation (min) (ACUTE ONLY) 15 min   Pt received in chair, requesting clean up d/t unconnected purewick. NT notified. MinA +2 to stand. Pt able to tolerate extra standing time while NT bath's and perform purewick change. HR peaked at 139 bpm during session. Pt asx throughout. Pt returned to chair with NT present in room.   Leory Plowman  Mobility Specialist Please contact via [employer] office at [phone]

## 2023-07-20 NOTE — Progress Notes (Signed)
PROGRESS NOTE    Denise Mckenzie  MWU:[id number] DOB: [birth year] DOA: 07/12/2023 PCP: Denise Apley, MD     Brief Narrative:  Denise Mckenzie is a 77 year old female with history of CAD, CABG in 1996, COPD, hypertension, hyperlipidemia, breast cancer, lung cancer transferred after having an episode of syncope.  Patient has been having worsening of the lower extremity edema, weight increase from 129 lb to 148 lb, worsening of the shortness of breath.  Patient hardly can walk across the room without experiencing shortness of breath.  Despite increasing the Lasix once a day to 2 times a day did not have any improvement. Workup in the emergency department patient was found to be in sinus rhythm.  Patient was admitted for CHF exacerbation.  Echocardiogram revealed severe right heart failure with RV dilation.  She was treated with IV diuresis.  Cardiology consulted.  Patient also underwent TEE/DCCV for A-fib and converted to normal sinus rhythm 11/29.  Patient continued to diurese well.  We were working on getting her discharged to skilled nursing facility, however on 12/2 afternoon, patient went back into A-fib RVR.  She was started on amiodarone.  New events last 24 hours / Subjective: Still does not feel great.  Remains in A-fib RVR today with heart rate 110-120s  Assessment & Plan:  Principal Problem:   Anasarca Active Problems:   Acute exacerbation of CHF (congestive heart failure) (HCC)   Acute heart failure with preserved ejection fraction (HFpEF) (HCC)   Acute right heart failure (HCC)   Atrial flutter, paroxysmal (HCC)   Hx of CABG   PAF (paroxysmal atrial fibrillation) (HCC)   Acute on chronic diastolic heart failure, right heart failure Severe pulmonary hypertension with right heart failure -CTA chest negative for PE -Echocardiogram: LVEF 60-65%, no WMAs, grade I dd, D septum consistent with RV pressure and volume overload, severe RV dysfunction, PASP 46, severe RAE  -Cardiology  consulted -Possible right heart cath, to be arranged outpatient by Dr. Bing Matter -Daily weight, strict I's and O's -Lasix 40 mg twice daily p.o.  A-fib RVR -Status post TEE/DCCV 11/29, converted to normal sinus rhythm -Went back into A-fib RVR 12/2, started on amiodarone, Cardizem.  Continue digoxin, Eliquis  COPD exacerbation -Solu-Medrol --> prednisone, breathing treatment -Improving  HCAP -Rocephin  Acute hypoxic, hypercarbic respiratory failure -Doing well on nasal cannula O2 this morning -BiPAP nightly, poor tolerance -Recommend outpatient sleep study  Hypotension -Midodrine  Hyperlipidemia -Lipitor  Hypothyroidism -Synthroid  Hypokalemia -Replace    DVT prophylaxis:  SCDs Start: 07/12/23 2114 apixaban (ELIQUIS) tablet 5 mg  Code Status: Full code Family Communication: None at bedside Disposition Plan: SNF Status is: Inpatient Remains inpatient appropriate because: SNF placement pending, need rate control   Antimicrobials:  Anti-infectives (From admission, onward)    Start     Dose/Rate Route Frequency Ordered Stop   07/16/23 1445  cefTRIAXone (ROCEPHIN) 2 g in sodium chloride 0.9 % 100 mL IVPB        2 g 200 mL/hr over 30 Minutes Intravenous Every 24 hours 07/16/23 1351 07/20/23 0715   07/16/23 0000  vancomycin (VANCOREADY) IVPB 750 mg/150 mL  Status:  Discontinued        750 mg 150 mL/hr over 60 Minutes Intravenous Every 24 hours 07/15/23 1237 07/16/23 1351   07/15/23 1400  ceFEPIme (MAXIPIME) 2 g in sodium chloride 0.9 % 100 mL IVPB  Status:  Discontinued        2 g 200 mL/hr over 30 Minutes Intravenous Every 12  hours 07/15/23 1233 07/16/23 1351   07/14/23 0200  vancomycin (VANCOREADY) IVPB 500 mg/100 mL  Status:  Discontinued        500 mg 100 mL/hr over 60 Minutes Intravenous Every 24 hours 07/13/23 0047 07/15/23 1237   07/13/23 0200  ceFEPIme (MAXIPIME) 2 g in sodium chloride 0.9 % 100 mL IVPB  Status:  Discontinued        2 g 200 mL/hr over  30 Minutes Intravenous Every 24 hours 07/13/23 0047 07/15/23 1233   07/13/23 0200  vancomycin (VANCOREADY) IVPB 1500 mg/300 mL        1,500 mg 150 mL/hr over 120 Minutes Intravenous  Once 07/13/23 0047 07/13/23 0412        Objective: Vitals:   07/20/23 0852 07/20/23 0946 07/20/23 1203 07/20/23 1347  BP: (!) 129/90  135/84   Pulse: (!) 135 (!) 109 (!) 136 (!) 109  Resp: (!) 25 20  (!) 22  Temp:   (!) 97.5 F (36.4 C)   TempSrc:   Oral   SpO2: 98% 96% 98% 98%  Weight:    72.3 kg  Height:    5\' 2"  (1.575 m)    Intake/Output Summary (Last 24 hours) at 07/20/2023 1538 Last data filed at 07/20/2023 0958 Gross per 24 hour  Intake 240 ml  Output 1200 ml  Net -960 ml   Filed Weights   07/18/23 0401 07/19/23 0553 07/20/23 1347  Weight: 71.6 kg 72.3 kg 72.3 kg    Examination:  General exam: Appears calm and comfortable  Respiratory system: Clear to auscultation anteriorly. Respiratory effort normal. No respiratory distress. No conversational dyspnea, mildly tachypneic.  On nasal cannula O2 Cardiovascular system: S1 & S2 heard, irregular rhythm, tachycardic. No murmurs.  Bilateral pedal edema, improved Gastrointestinal system: Abdomen is nondistended, soft and nontender. Normal bowel sounds heard. Central nervous system: Alert and oriented. No focal neurological deficits. Speech clear.  Extremities: Symmetric in appearance  Skin: No rashes, lesions or ulcers on exposed skin  Psychiatry: Judgement and insight appear normal. Mood & affect appropriate.   Data Reviewed: I have personally reviewed following labs and imaging studies  CBC: Recent Labs  Lab 07/17/23 0238  WBC 11.5*  HGB 15.4*  HCT 50.2*  MCV 93.7  PLT 98*   Basic Metabolic Panel: Recent Labs  Lab 07/15/23 0244 07/17/23 0238 07/18/23 1018 07/19/23 0432 07/20/23 0316  NA 132* 138 138 138 141  K 3.9 4.2 4.5 4.2 3.4*  CL 96* 103 99 98 100  CO2 28 30 30  32 35*  GLUCOSE 144* 155* 127* 106* 139*  BUN 31* 27*  25* 24* 22  CREATININE 1.01* 0.78 0.79 0.80 0.59  CALCIUM 8.7* 8.9 8.6* 8.2* 7.9*  MG  --   --  2.4  --   --    GFR: Estimated Creatinine Clearance: 53.1 mL/min (by C-G formula based on SCr of 0.59 mg/dL). Liver Function Tests: No results for input(s): "AST", "ALT", "ALKPHOS", "BILITOT", "PROT", "ALBUMIN" in the last 168 hours.  No results for input(s): "LIPASE", "AMYLASE" in the last 168 hours. No results for input(s): "AMMONIA" in the last 168 hours. Coagulation Profile: No results for input(s): "INR", "PROTIME" in the last 168 hours.  Cardiac Enzymes: No results for input(s): "CKTOTAL", "CKMB", "CKMBINDEX", "TROPONINI" in the last 168 hours. BNP (last 3 results) Recent Labs    02/22/23 1136 07/02/23 1645 07/09/23 1131  PROBNP 5,414* 21,913* 33,547*   HbA1C: No results for input(s): "HGBA1C" in the last 72 hours. CBG:  Recent Labs  Lab 07/19/23 1133 07/19/23 1702 07/19/23 2049 07/20/23 0612 07/20/23 1203  GLUCAP 161* 186* 183* 117* 118*   Lipid Profile: No results for input(s): "CHOL", "HDL", "LDLCALC", "TRIG", "CHOLHDL", "LDLDIRECT" in the last 72 hours. Thyroid Function Tests: No results for input(s): "TSH", "T4TOTAL", "FREET4", "T3FREE", "THYROIDAB" in the last 72 hours. Anemia Panel: No results for input(s): "VITAMINB12", "FOLATE", "FERRITIN", "TIBC", "IRON", "RETICCTPCT" in the last 72 hours. Sepsis Labs: No results for input(s): "PROCALCITON", "LATICACIDVEN" in the last 168 hours.   Recent Results (from the past 240 hour(s))  Resp panel by RT-PCR (RSV, Flu A&B, Covid) Anterior Nasal Swab     Status: None   Collection Time: 07/12/23  9:52 PM   Specimen: Anterior Nasal Swab  Result Value Ref Range Status   SARS Coronavirus 2 by RT PCR NEGATIVE NEGATIVE Final   Influenza A by PCR NEGATIVE NEGATIVE Final   Influenza B by PCR NEGATIVE NEGATIVE Final    Comment: (NOTE) The Xpert Xpress SARS-CoV-2/FLU/RSV plus assay is intended as an aid in the diagnosis of  influenza from Nasopharyngeal swab specimens and should not be used as a sole basis for treatment. Nasal washings and aspirates are unacceptable for Xpert Xpress SARS-CoV-2/FLU/RSV testing.  Fact Sheet for Patients: BloggerCourse.com  Fact Sheet for Healthcare Providers: SeriousBroker.it  This test is not yet approved or cleared by the [place] FDA and has been authorized for detection and/or diagnosis of SARS-CoV-2 by FDA under an Emergency Use Authorization (EUA). This EUA will remain in effect (meaning this test can be used) for the duration of the COVID-19 declaration under Section 564(b)(1) of the Act, 21 U.S.C. section 360bbb-3(b)(1), unless the authorization is terminated or revoked.     Resp Syncytial Virus by PCR NEGATIVE NEGATIVE Final    Comment: (NOTE) Fact Sheet for Patients: BloggerCourse.com  Fact Sheet for Healthcare Providers: SeriousBroker.it  This test is not yet approved or cleared by the [place] FDA and has been authorized for detection and/or diagnosis of SARS-CoV-2 by FDA under an Emergency Use Authorization (EUA). This EUA will remain in effect (meaning this test can be used) for the duration of the COVID-19 declaration under Section 564(b)(1) of the Act, 21 U.S.C. section 360bbb-3(b)(1), unless the authorization is terminated or revoked.  Performed at [facility], [address] [place], [place] [address]   Culture, blood (Routine X 2) w Reflex to ID Panel     Status: None   Collection Time: 07/13/23 12:43 AM   Specimen: BLOOD RIGHT HAND  Result Value Ref Range Status   Specimen Description BLOOD RIGHT HAND  Final   Special Requests   Final    BOTTLES DRAWN AEROBIC AND ANAEROBIC Blood Culture adequate volume   Culture   Final    NO GROWTH 5 DAYS Performed at [facility], [address] [place], [place] [address]     Report Status 07/18/2023 FINAL  Final  Culture, blood (Routine X 2) w Reflex to ID Panel     Status: None   Collection Time: 07/13/23  3:37 AM   Specimen: BLOOD RIGHT HAND  Result Value Ref Range Status   Specimen Description BLOOD RIGHT HAND  Final   Special Requests   Final    BOTTLES DRAWN AEROBIC AND ANAEROBIC Blood Culture adequate volume   Culture   Final    NO GROWTH 5 DAYS Performed at [facility], [address], [place], [place] [address]    Report Status  07/18/2023 FINAL  Final  MRSA Next Gen by PCR, Nasal     Status: None   Collection Time: 07/14/23 12:30 PM   Specimen: Nasal Mucosa; Nasal Swab  Result Value Ref Range Status   MRSA by PCR Next Gen NOT DETECTED NOT DETECTED Final    Comment: (NOTE) The GeneXpert MRSA Assay (FDA approved for NASAL specimens only), is one component of a comprehensive MRSA colonization surveillance program. It is not intended to diagnose MRSA infection nor to guide or monitor treatment for MRSA infections. Test performance is not FDA approved in patients less than 34 years old. Performed at [facility], [address], [place], [place] [address]       Radiology Studies: No results found.    Scheduled Meds:  amiodarone  400 mg Oral BID   amitriptyline  50 mg Oral QHS   apixaban  5 mg Oral BID   aspirin EC  81 mg Oral Daily   atorvastatin  40 mg Oral QHS   digoxin  0.125 mg Oral Daily   diltiazem  30 mg Oral Q8H   docusate sodium  100 mg Oral BID   furosemide  40 mg Oral BID   guaiFENesin  600 mg Oral BID   insulin aspart  0-6 Units Subcutaneous TID WC   lactulose  20 g Oral BID   levothyroxine  88 mcg Oral Q0600   midodrine  5 mg Oral TID WC   mometasone-formoterol  2 puff Inhalation BID   predniSONE  50 mg Oral Q breakfast   sodium chloride flush  3 mL Intravenous Q12H   Continuous Infusions:     LOS: 8 days   Time spent: 30 minutes   Denise Stain, DO [facility] 07/20/2023, 3:38 PM    Available via Epic secure chat 7am-7pm After these hours, please refer to coverage provider listed on [email or web address]

## 2023-07-20 NOTE — Progress Notes (Addendum)
Rounding Note    Patient Name: Denise Mckenzie Date of Encounter: 07/20/2023  [facility] Cardiologist: Armanda Magic, MD   Subjective   Pt found sitting up in bed needing to reposition. No cardiac complaints. Tolerated CPAP for about 30 min last night.   Inpatient Medications    Scheduled Meds:  amiodarone  400 mg Oral BID   amitriptyline  50 mg Oral QHS   apixaban  5 mg Oral BID   aspirin EC  81 mg Oral Daily   atorvastatin  40 mg Oral QHS   digoxin  0.125 mg Oral Daily   docusate sodium  100 mg Oral BID   furosemide  40 mg Oral BID   guaiFENesin  600 mg Oral BID   insulin aspart  0-6 Units Subcutaneous TID WC   lactulose  20 g Oral BID   levothyroxine  88 mcg Oral Q0600   midodrine  5 mg Oral TID WC   mometasone-formoterol  2 puff Inhalation BID   predniSONE  50 mg Oral Q breakfast   sodium chloride flush  3 mL Intravenous Q12H   Continuous Infusions:  PRN Meds: acetaminophen, hydrOXYzine, ipratropium-albuterol   Vital Signs    Vitals:   07/19/23 2049 07/19/23 2334 07/20/23 0200 07/20/23 0725  BP: (!) 116/49 109/62    Pulse: (!) 132 (!) 131 96 (!) 101  Resp: 20 19 15 19   Temp: 98.4 F (36.9 C) 98 F (36.7 C)    TempSrc: Oral Oral    SpO2: 96% 97% 100% 100%  Weight:        Intake/Output Summary (Last 24 hours) at 07/20/2023 0846 Last data filed at 07/20/2023 0009 Gross per 24 hour  Intake 480 ml  Output 1200 ml  Net -720 ml      07/19/2023    5:53 AM 07/18/2023    4:01 AM 07/16/2023    5:00 AM  Last 3 Weights  Weight (lbs) 159 lb 6.3 oz 157 lb 13.6 oz 154 lb 5.2 oz  Weight (kg) 72.3 kg 71.6 kg 70 kg      Telemetry    Atrial flutter HR 130s - Personally Reviewed  ECG    No new tracings - Personally Reviewed  Physical Exam   GEN: No acute distress.   Neck: No JVD Cardiac: irregular rhythm tachycardic rate Respiratory: diminished and rhonchus throughout GI: Soft, nontender, non-distended  MS: No edema; No deformity. Neuro:   Nonfocal  Psych: Normal affect   Labs    High Sensitivity Troponin:   Recent Labs  Lab 07/12/23 1434 07/12/23 2059  TROPONINIHS 59* 57*     Chemistry Recent Labs  Lab 07/18/23 1018 07/19/23 0432 07/20/23 0316  NA 138 138 141  K 4.5 4.2 3.4*  CL 99 98 100  CO2 30 32 35*  GLUCOSE 127* 106* 139*  BUN 25* 24* 22  CREATININE 0.79 0.80 0.59  CALCIUM 8.6* 8.2* 7.9*  MG 2.4  --   --   GFRNONAA >60 >60 >60  ANIONGAP 9 8 6     Lipids No results for input(s): "CHOL", "TRIG", "HDL", "LABVLDL", "LDLCALC", "CHOLHDL" in the last 168 hours.  Hematology Recent Labs  Lab 07/17/23 0238  WBC 11.5*  RBC 5.36*  HGB 15.4*  HCT 50.2*  MCV 93.7  MCH 28.7  MCHC 30.7  RDW 17.3*  PLT 98*   Thyroid No results for input(s): "TSH", "FREET4" in the last 168 hours.  BNPNo results for input(s): "BNP", "PROBNP" in the last  168 hours.  DDimer No results for input(s): "DDIMER" in the last 168 hours.   Radiology    No results found.  Cardiac Studies   Echo 07/13/23:  1. Left ventricular ejection fraction, by estimation, is 60 to 65%. The  left ventricle has normal function. The left ventricle has no regional  wall motion abnormalities. Left ventricular diastolic parameters are  consistent with Grade I diastolic  dysfunction (impaired relaxation). There is the interventricular septum is  flattened in systole and diastole, consistent with right ventricular  pressure and volume overload.   2. Right ventricular systolic function is severely reduced. The right  ventricular size is moderately enlarged. There is moderately elevated  pulmonary artery systolic pressure. The estimated right ventricular  systolic pressure is 45.9 mmHg.   3. Right atrial size was severely dilated.   4. The mitral valve is normal in structure. Trivial mitral valve  regurgitation.   5. The aortic valve is normal in structure. There is mild calcification  of the aortic valve. Aortic valve regurgitation is not  visualized.   6. The inferior vena cava is dilated in size with <50% respiratory  variability, suggesting right atrial pressure of 15 mmHg.   Patient Profile     77 y.o. female with a hx of CAD (CABG 1996), COPD, HTN, HLD, Breast CA, Lung CA; who is being seen 07/13/2023 for the evaluation of syncope and volume overload   Assessment & Plan    Acute on chronic diastolic heart failure RV dysfunction Pulmonary hypertension - echo with preserved LVEF 60-65%, no WMA, grade 1 DD, D septum consistent with RV pressure and volume overload, severe RV dysfunction, PASP 46, severe RAE - CTA negative for PE but consistent with emphysema - intolerant to CPAP - felt combined group II and III pulmonary hypertension   Persistent atrial flutter Chronic anticoagulation - TEE-DCCV on 07/16/23 to SR - unfortunately she reverted to flutter / fib with RVR yesterday afternoon - started PO amiodarone 400 gm BID - she remains in intermittent flutter with rates in the 130s - maintaining adequate BP - will watch rates today, may need to load with IV amiodarone and re-attempt cardioversion in 24-48 hrs -  - amiodarone not a great antiarrhythmic given her lung disease, but options are limited - continue eliquis  - consider changing PO to IV amiodarone for faster load, may need to consider second DCCV after amiodarone load   CAD - remote CABG - no chest pain this admission        For questions or updates, please contact [facility] Please consult [email or web address] for contact info under        Signed, Marcelino Duster, PA  07/20/2023, 8:46 AM    Patient examined chart reviewed. She is stable PAF rates 120-130 on oral amiodarone 400 bid. On digoxin. Significant COPD with wheezing on steroids with elevated Hct from pulmonary HTN and hypoxia. Only used CPAP for about 30 minutes. Systolic BP > 100 mmHg will add low dose short acting cardizem to dig and amiodarone. Avoid beta blocker with active  wheezing   Charlton Haws MD [facility]

## 2023-07-20 NOTE — TOC Progression Note (Signed)
Transition of Care [facility]) - Progression Note    Patient Details  Name: Denise Mckenzie MRN: [id number] Date of Birth: 06-02-1946  [facility] CM/SW Contact  Eduard Roux, [place] Phone Number: 07/20/2023, 12:08 PM  Clinical Narrative:     Per MD patient not ready for d/c . CSW updated [facility].   TOC will continue to follow and assist with d/c planning.   Antony Blackbird, MSW, LCSW Clinical Social Worker    Expected Discharge Plan: Skilled Nursing Facility Barriers to Discharge: SNF Pending bed offer, Insurance Authorization  Expected Discharge Plan and Services       Living arrangements for the past 2 months: Single Family Home                                       Social Determinants of Health (SDOH) Interventions SDOH Screenings   Food Insecurity: Food Insecurity Present (07/13/2023)  Housing: Low Risk  (07/13/2023)  Transportation Needs: No Transportation Needs (07/13/2023)  Utilities: Not At Risk (07/13/2023)  Tobacco Use: Medium Risk (07/16/2023)    Readmission Risk Interventions     No data to display

## 2023-07-20 NOTE — TOC Progression Note (Addendum)
Transition of Care [facility]) - Progression Note    Patient Details  Name: Denise Mckenzie MRN: [id number] Date of Birth: 1946-05-10  Transition of Care [facility]) CM/SW Contact  Eduard Roux, [place] Phone Number: 07/20/2023, 11:33 AM  Clinical Narrative:     Insurance auth received # [id number]  12/3-12/5  Called [facility]- waiting on call back  Expected Discharge Plan: Skilled Nursing Facility Barriers to Discharge: SNF Pending bed offer, Insurance Authorization  Expected Discharge Plan and Services       Living arrangements for the past 2 months: Single Family Home                                       Social Determinants of Health (SDOH) Interventions SDOH Screenings   Food Insecurity: Food Insecurity Present (07/13/2023)  Housing: Low Risk  (07/13/2023)  Transportation Needs: No Transportation Needs (07/13/2023)  Utilities: Not At Risk (07/13/2023)  Tobacco Use: Medium Risk (07/16/2023)    Readmission Risk Interventions     No data to display

## 2023-07-20 NOTE — Plan of Care (Signed)

## 2023-07-20 NOTE — Plan of Care (Signed)
  Problem: Education: Goal: Ability to describe self-care measures that may prevent or decrease complications (Diabetes Survival Skills Education) will improve Outcome: Progressing Goal: Individualized Educational Video(s) Outcome: Progressing   

## 2023-07-20 NOTE — Care Management Important Message (Signed)
Important Message  Patient Details  Name: RUBERTA ESMERALDA MRN: [id number] Date of Birth: 07/17/1946   Important Message Given:  Yes - Medicare IM     Sherilyn Banker 07/20/2023, 2:48 PM

## 2023-07-20 NOTE — Progress Notes (Signed)
   07/20/23 2017  BiPAP/CPAP/SIPAP  BiPAP/CPAP/SIPAP Pt Type Adult  BiPAP/CPAP/SIPAP Resmed  Reason BIPAP/CPAP not in use Non-compliant (pt stated she doesn't want to wear CPAP)  BiPAP/CPAP /SiPAP Vitals  Pulse Rate (!) 103  Resp 18  SpO2 100 %  Bilateral Breath Sounds Clear;Diminished  MEWS Score/Color  MEWS Score 1  MEWS Score Color Green

## 2023-07-21 DIAGNOSIS — R601 Generalized edema: Secondary | ICD-10-CM | POA: Diagnosis not present

## 2023-07-21 DIAGNOSIS — I480 Paroxysmal atrial fibrillation: Secondary | ICD-10-CM | POA: Diagnosis not present

## 2023-07-21 LAB — GLUCOSE, CAPILLARY
Glucose-Capillary: 126 mg/dL — ABNORMAL HIGH (ref 70–99)
Glucose-Capillary: 133 mg/dL — ABNORMAL HIGH (ref 70–99)
Glucose-Capillary: 169 mg/dL — ABNORMAL HIGH (ref 70–99)
Glucose-Capillary: 158 mg/dL — ABNORMAL HIGH (ref 70–99)

## 2023-07-21 LAB — BASIC METABOLIC PANEL
Anion gap: 6 (ref 5–15)
BUN: 22 mg/dL (ref 8–23)
CO2: 32 mmol/L (ref 22–32)
Calcium: 7.7 mg/dL — ABNORMAL LOW (ref 8.9–10.3)
Chloride: 99 mmol/L (ref 98–111)
Creatinine, Ser: 0.6 mg/dL (ref 0.44–1.00)
GFR, Estimated: >60 mL/min (ref >60)
Glucose, Bld: 157 mg/dL — ABNORMAL HIGH (ref 70–99)
Potassium: 3.9 mmol/L (ref 3.5–5.1)
Sodium: 137 mmol/L (ref 135–145)

## 2023-07-21 LAB — CBC
HCT: 48.2 % — ABNORMAL HIGH (ref 36.0–46.0)
Hemoglobin: 15 g/dL (ref 12.0–15.0)
MCH: 28.5 pg (ref 26.0–34.0)
MCHC: 31.1 g/dL (ref 30.0–36.0)
MCV: 91.6 fL (ref 80.0–100.0)
Platelets: 91 10*3/uL — ABNORMAL LOW (ref 150–400)
RBC: 5.26 MIL/uL — ABNORMAL HIGH (ref 3.87–5.11)
RDW: 17 % — ABNORMAL HIGH (ref 11.5–15.5)
WBC: 14.1 10*3/uL — ABNORMAL HIGH (ref 4.0–10.5)
nRBC: 0 % (ref 0.0–0.2)

## 2023-07-21 MED ORDER — LEVALBUTEROL HCL 0.63 MG/3ML IN NEBU
0.6300 mg | INHALATION_SOLUTION | Freq: Four times a day (QID) | RESPIRATORY_TRACT | Status: DC
  Administered 2023-07-21 – 2023-07-23 (×6): 0.63 mg via RESPIRATORY_TRACT
  Filled 2023-07-21 (×6): qty 3

## 2023-07-21 MED ORDER — ARFORMOTEROL TARTRATE 15 MCG/2ML IN NEBU
15.0000 ug | INHALATION_SOLUTION | Freq: Two times a day (BID) | RESPIRATORY_TRACT | Status: DC
  Administered 2023-07-21 – 2023-07-23 (×4): 15 ug via RESPIRATORY_TRACT
  Filled 2023-07-21 (×4): qty 2

## 2023-07-21 MED ORDER — BUDESONIDE 0.25 MG/2ML IN SUSP
0.2500 mg | Freq: Two times a day (BID) | RESPIRATORY_TRACT | Status: DC
  Administered 2023-07-21 – 2023-07-23 (×4): 0.25 mg via RESPIRATORY_TRACT
  Filled 2023-07-21 (×4): qty 2

## 2023-07-21 NOTE — Progress Notes (Signed)
PROGRESS NOTE    Denise Mckenzie  UJW:[id number] DOB: 22-Jan-1946 DOA: 07/12/2023 PCP: Burton Apley, MD     Brief Narrative:  Denise Mckenzie is a 77 year old female with history of CAD, CABG in 1996, COPD, hypertension, hyperlipidemia, breast cancer, lung cancer transferred after having an episode of syncope.  Patient has been having worsening of the lower extremity edema, weight increase from 129 lb to 148 lb, worsening of the shortness of breath. Patient was admitted for CHF exacerbation.  Echocardiogram revealed severe right heart failure with RV dilation.  She was treated with IV diuresis.  Cardiology consulted.  Patient also underwent TEE/DCCV for A-fib and converted to normal sinus rhythm 11/29. However on 12/2, patient went back into A-fib RVR.  She was started on amiodarone.   New events last 24 hours / Subjective: Pt reports feeling poorly, still with shortness of breath, noted bilateral wheezing, with noted fluid overload.  Patient wants to be ambulated frequently as she reports it helps her breathing.  Patient denies any chest pain, abdominal pain, nausea/vomiting, fever/chills.   Assessment & Plan:  Principal Problem:   Anasarca Active Problems:   Acute exacerbation of CHF (congestive heart failure) (HCC)   Acute heart failure with preserved ejection fraction (HFpEF) (HCC)   Acute right heart failure (HCC)   Atrial flutter, paroxysmal (HCC)   Hx of CABG   PAF (paroxysmal atrial fibrillation) (HCC)   Acute on chronic diastolic heart failure, right heart failure Severe pulmonary hypertension with right heart failure CTA chest negative for PE Echocardiogram: LVEF 60-65%, no WMAs, grade I dd, D septum consistent with RV pressure and volume overload, severe RV dysfunction, PASP 46, severe RAE  Cardiology consulted Possible right heart cath, to be arranged outpatient by Dr. Bing Matter Continue Lasix 40 mg twice daily p.o. Daily weight, strict I's and O's  Paroxysmal A-fib  RVR Status post TEE/DCCV 11/29, converted to normal sinus rhythm, went back into A-fib RVR 12/2, started on amiodarone, Cardizem.  Continue digoxin, Eliquis  COPD exacerbation Solu-Medrol --> prednisone, nebs  Acute hypoxic, hypercarbic respiratory failure -BiPAP nightly, poor tolerance -Recommend outpatient sleep study  Hypotension Midodrine  Hyperlipidemia Lipitor  Hypothyroidism Synthroid  Hypokalemia -Replace    DVT prophylaxis:  SCDs Start: 07/12/23 2114 apixaban (ELIQUIS) tablet 5 mg  Code Status: Full code Family Communication: None at bedside Disposition Plan: SNF Status is: Inpatient Remains inpatient appropriate because: SNF placement    Antimicrobials:  Anti-infectives (From admission, onward)    Start     Dose/Rate Route Frequency Ordered Stop   07/16/23 1445  cefTRIAXone (ROCEPHIN) 2 g in sodium chloride 0.9 % 100 mL IVPB        2 g 200 mL/hr over 30 Minutes Intravenous Every 24 hours 07/16/23 1351 07/20/23 0715   07/16/23 0000  vancomycin (VANCOREADY) IVPB 750 mg/150 mL  Status:  Discontinued        750 mg 150 mL/hr over 60 Minutes Intravenous Every 24 hours 07/15/23 1237 07/16/23 1351   07/15/23 1400  ceFEPIme (MAXIPIME) 2 g in sodium chloride 0.9 % 100 mL IVPB  Status:  Discontinued        2 g 200 mL/hr over 30 Minutes Intravenous Every 12 hours 07/15/23 1233 07/16/23 1351   07/14/23 0200  vancomycin (VANCOREADY) IVPB 500 mg/100 mL  Status:  Discontinued        500 mg 100 mL/hr over 60 Minutes Intravenous Every 24 hours 07/13/23 0047 07/15/23 1237   07/13/23 0200  ceFEPIme (MAXIPIME) 2 g  in sodium chloride 0.9 % 100 mL IVPB  Status:  Discontinued        2 g 200 mL/hr over 30 Minutes Intravenous Every 24 hours 07/13/23 0047 07/15/23 1233   07/13/23 0200  vancomycin (VANCOREADY) IVPB 1500 mg/300 mL        1,500 mg 150 mL/hr over 120 Minutes Intravenous  Once 07/13/23 0047 07/13/23 0412        Objective: Vitals:   07/21/23 0750 07/21/23 0800  07/21/23 1151 07/21/23 1532  BP: 125/69 125/69 (!) 130/57 (!) 131/55  Pulse: 79 88 93 88  Resp: (!) 22 (!) 22 (!) 24 18  Temp: 97.6 F (36.4 C) 97.6 F (36.4 C) 98.5 F (36.9 C) 98.7 F (37.1 C)  TempSrc:   Oral Oral  SpO2: 96% 97% 95% 96%  Weight:      Height:        Intake/Output Summary (Last 24 hours) at 07/21/2023 1904 Last data filed at 07/21/2023 1352 Gross per 24 hour  Intake 480 ml  Output 1400 ml  Net -920 ml   Filed Weights   07/19/23 0553 07/20/23 1347 07/21/23 0431  Weight: 72.3 kg 72.3 kg 73.5 kg    Examination:  General: NAD Cardiovascular: S1, S2 present Respiratory: Diminished breath sounds, bilateral wheezing Abdomen: Soft, nontender, nondistended, bowel sounds present, anasarca Musculoskeletal: bilateral pedal edema noted Skin: Normal Psychiatry: Normal mood   Data Reviewed: I have personally reviewed following labs and imaging studies  CBC: Recent Labs  Lab 07/17/23 0238 07/21/23 0315  WBC 11.5* 14.1*  HGB 15.4* 15.0  HCT 50.2* 48.2*  MCV 93.7 91.6  PLT 98* 91*   Basic Metabolic Panel: Recent Labs  Lab 07/17/23 0238 07/18/23 1018 07/19/23 0432 07/20/23 0316 07/21/23 0315  NA 138 138 138 141 137  K 4.2 4.5 4.2 3.4* 3.9  CL 103 99 98 100 99  CO2 30 30 32 35* 32  GLUCOSE 155* 127* 106* 139* 157*  BUN 27* 25* 24* 22 22  CREATININE 0.78 0.79 0.80 0.59 0.60  CALCIUM 8.9 8.6* 8.2* 7.9* 7.7*  MG  --  2.4  --   --   --    GFR: Estimated Creatinine Clearance: 53.6 mL/min (by C-G formula based on SCr of 0.6 mg/dL). Liver Function Tests: No results for input(s): "AST", "ALT", "ALKPHOS", "BILITOT", "PROT", "ALBUMIN" in the last 168 hours.  No results for input(s): "LIPASE", "AMYLASE" in the last 168 hours. No results for input(s): "AMMONIA" in the last 168 hours. Coagulation Profile: No results for input(s): "INR", "PROTIME" in the last 168 hours.  Cardiac Enzymes: No results for input(s): "CKTOTAL", "CKMB", "CKMBINDEX", "TROPONINI"  in the last 168 hours. BNP (last 3 results) Recent Labs    02/22/23 1136 07/02/23 1645 07/09/23 1131  PROBNP 5,414* 21,913* 33,547*   HbA1C: No results for input(s): "HGBA1C" in the last 72 hours. CBG: Recent Labs  Lab 07/20/23 1638 07/20/23 2108 07/21/23 0555 07/21/23 1207 07/21/23 1642  GLUCAP 154* 161* 126* 133* 169*   Lipid Profile: No results for input(s): "CHOL", "HDL", "LDLCALC", "TRIG", "CHOLHDL", "LDLDIRECT" in the last 72 hours. Thyroid Function Tests: No results for input(s): "TSH", "T4TOTAL", "FREET4", "T3FREE", "THYROIDAB" in the last 72 hours. Anemia Panel: No results for input(s): "VITAMINB12", "FOLATE", "FERRITIN", "TIBC", "IRON", "RETICCTPCT" in the last 72 hours. Sepsis Labs: No results for input(s): "PROCALCITON", "LATICACIDVEN" in the last 168 hours.   Recent Results (from the past 240 hour(s))  Resp panel by RT-PCR (RSV, Flu A&B, Covid) Anterior Nasal  Swab     Status: None   Collection Time: 07/12/23  9:52 PM   Specimen: Anterior Nasal Swab  Result Value Ref Range Status   SARS Coronavirus 2 by RT PCR NEGATIVE NEGATIVE Final   Influenza A by PCR NEGATIVE NEGATIVE Final   Influenza B by PCR NEGATIVE NEGATIVE Final    Comment: (NOTE) The Xpert Xpress SARS-CoV-2/FLU/RSV plus assay is intended as an aid in the diagnosis of influenza from Nasopharyngeal swab specimens and should not be used as a sole basis for treatment. Nasal washings and aspirates are unacceptable for Xpert Xpress SARS-CoV-2/FLU/RSV testing.  Fact Sheet for Patients: BloggerCourse.com  Fact Sheet for Healthcare Providers: SeriousBroker.it  This test is not yet approved or cleared by the [place] FDA and has been authorized for detection and/or diagnosis of SARS-CoV-2 by FDA under an Emergency Use Authorization (EUA). This EUA will remain in effect (meaning this test can be used) for the duration of the COVID-19 declaration  under Section 564(b)(1) of the Act, 21 U.S.C. section 360bbb-3(b)(1), unless the authorization is terminated or revoked.     Resp Syncytial Virus by PCR NEGATIVE NEGATIVE Final    Comment: (NOTE) Fact Sheet for Patients: BloggerCourse.com  Fact Sheet for Healthcare Providers: SeriousBroker.it  This test is not yet approved or cleared by the [place] FDA and has been authorized for detection and/or diagnosis of SARS-CoV-2 by FDA under an Emergency Use Authorization (EUA). This EUA will remain in effect (meaning this test can be used) for the duration of the COVID-19 declaration under Section 564(b)(1) of the Act, 21 U.S.C. section 360bbb-3(b)(1), unless the authorization is terminated or revoked.  Performed at [facility], [address] [place], [place] [address]   Culture, blood (Routine X 2) w Reflex to ID Panel     Status: None   Collection Time: 07/13/23 12:43 AM   Specimen: BLOOD RIGHT HAND  Result Value Ref Range Status   Specimen Description BLOOD RIGHT HAND  Final   Special Requests   Final    BOTTLES DRAWN AEROBIC AND ANAEROBIC Blood Culture adequate volume   Culture   Final    NO GROWTH 5 DAYS Performed at [facility], [address], [place], [place] [address]    Report Status 07/18/2023 FINAL  Final  Culture, blood (Routine X 2) w Reflex to ID Panel     Status: None   Collection Time: 07/13/23  3:37 AM   Specimen: BLOOD RIGHT HAND  Result Value Ref Range Status   Specimen Description BLOOD RIGHT HAND  Final   Special Requests   Final    BOTTLES DRAWN AEROBIC AND ANAEROBIC Blood Culture adequate volume   Culture   Final    NO GROWTH 5 DAYS Performed at [facility], [address] [place], [place] [address]    Report Status 07/18/2023 FINAL  Final  MRSA Next Gen by PCR, Nasal     Status: None   Collection Time: 07/14/23 12:30 PM   Specimen: Nasal Mucosa; Nasal Swab  Result Value  Ref Range Status   MRSA by PCR Next Gen NOT DETECTED NOT DETECTED Final    Comment: (NOTE) The GeneXpert MRSA Assay (FDA approved for NASAL specimens only), is one component of a comprehensive MRSA colonization surveillance program. It is not intended to diagnose MRSA infection nor to guide or monitor treatment for MRSA infections. Test performance is not FDA approved in patients less than 42 years old. Performed at [facility]  [facility], [address] [place], [place] [address]       Radiology Studies: No results found.    Scheduled Meds:  amiodarone  400 mg Oral BID   amitriptyline  50 mg Oral QHS   apixaban  5 mg Oral BID   aspirin EC  81 mg Oral Daily   atorvastatin  40 mg Oral QHS   digoxin  0.125 mg Oral Daily   diltiazem  30 mg Oral Q8H   docusate sodium  100 mg Oral BID   furosemide  40 mg Oral BID   guaiFENesin  600 mg Oral BID   insulin aspart  0-6 Units Subcutaneous TID WC   lactulose  20 g Oral BID   levothyroxine  88 mcg Oral Q0600   midodrine  5 mg Oral TID WC   mometasone-formoterol  2 puff Inhalation BID   predniSONE  50 mg Oral Q breakfast   sodium chloride flush  3 mL Intravenous Q12H   Continuous Infusions:     LOS: 9 days     Briant Cedar, MD [facility] 07/21/2023, 7:04 PM   Available via Epic secure chat 7am-7pm After these hours, please refer to coverage provider listed on [email or web address]

## 2023-07-21 NOTE — Plan of Care (Signed)
  Problem: Education: Goal: Ability to describe self-care measures that may prevent or decrease complications (Diabetes Survival Skills Education) will improve Outcome: Progressing Goal: Individualized Educational Video(s) Outcome: Progressing   Problem: Coping: Goal: Ability to adjust to condition or change in health will improve Outcome: Progressing   Problem: Fluid Volume: Goal: Ability to maintain a balanced intake and output will improve Outcome: Progressing   Problem: Health Behavior/Discharge Planning: Goal: Ability to identify and utilize available resources and services will improve Outcome: Progressing Goal: Ability to manage health-related needs will improve Outcome: Progressing   Problem: Health Behavior/Discharge Planning: Goal: Ability to identify and utilize available resources and services will improve Outcome: Progressing Goal: Ability to manage health-related needs will improve Outcome: Progressing   Problem: Metabolic: Goal: Ability to maintain appropriate glucose levels will improve Outcome: Progressing   Problem: Nutritional: Goal: Maintenance of adequate nutrition will improve Outcome: Progressing Goal: Progress toward achieving an optimal weight will improve Outcome: Progressing   Problem: Skin Integrity: Goal: Risk for impaired skin integrity will decrease Outcome: Progressing   Problem: Tissue Perfusion: Goal: Adequacy of tissue perfusion will improve Outcome: Progressing   Problem: Education: Goal: Knowledge of General Education information will improve Description: Including pain rating scale, medication(s)/side effects and non-pharmacologic comfort measures Outcome: Progressing   Problem: Health Behavior/Discharge Planning: Goal: Ability to manage health-related needs will improve Outcome: Progressing   Problem: Clinical Measurements: Goal: Ability to maintain clinical measurements within normal limits will improve Outcome:  Progressing Goal: Will remain free from infection Outcome: Progressing Goal: Diagnostic test results will improve Outcome: Progressing Goal: Respiratory complications will improve Outcome: Progressing Goal: Cardiovascular complication will be avoided Outcome: Progressing   Problem: Activity: Goal: Risk for activity intolerance will decrease Outcome: Progressing   Problem: Nutrition: Goal: Adequate nutrition will be maintained Outcome: Progressing   Problem: Nutrition: Goal: Adequate nutrition will be maintained Outcome: Progressing   Problem: Coping: Goal: Level of anxiety will decrease Outcome: Progressing   Problem: Elimination: Goal: Will not experience complications related to bowel motility Outcome: Progressing Goal: Will not experience complications related to urinary retention Outcome: Progressing   Problem: Pain Management: Goal: General experience of comfort will improve Outcome: Progressing

## 2023-07-21 NOTE — Progress Notes (Signed)
Rounding Note    Patient Name: ANJALI KLEINFELDT Date of Encounter: 07/21/2023  [facility] Cardiologist: Armanda Magic, MD   Subjective   No cardiac complaints this morning  Inpatient Medications    Scheduled Meds:  amiodarone  400 mg Oral BID   amitriptyline  50 mg Oral QHS   apixaban  5 mg Oral BID   aspirin EC  81 mg Oral Daily   atorvastatin  40 mg Oral QHS   digoxin  0.125 mg Oral Daily   diltiazem  30 mg Oral Q8H   docusate sodium  100 mg Oral BID   furosemide  40 mg Oral BID   guaiFENesin  600 mg Oral BID   insulin aspart  0-6 Units Subcutaneous TID WC   lactulose  20 g Oral BID   levothyroxine  88 mcg Oral Q0600   midodrine  5 mg Oral TID WC   mometasone-formoterol  2 puff Inhalation BID   predniSONE  50 mg Oral Q breakfast   sodium chloride flush  3 mL Intravenous Q12H   Continuous Infusions:  PRN Meds: acetaminophen, hydrOXYzine, ipratropium-albuterol   Vital Signs    Vitals:   07/20/23 2017 07/21/23 0104 07/21/23 0431 07/21/23 0749  BP:  136/66  125/69  Pulse: (!) 103 100  79  Resp: 18 16  (!) 29  Temp:  (!) 97.5 F (36.4 C) (!) 97.5 F (36.4 C) 97.6 F (36.4 C)  TempSrc:  Oral Oral Oral  SpO2: 100% 98%  98%  Weight:   73.5 kg   Height:        Intake/Output Summary (Last 24 hours) at 07/21/2023 0831 Last data filed at 07/21/2023 0816 Gross per 24 hour  Intake --  Output 2200 ml  Net -2200 ml      07/21/2023    4:31 AM 07/20/2023    1:47 PM 07/19/2023    5:53 AM  Last 3 Weights  Weight (lbs) 162 lb 0.6 oz 159 lb 6.3 oz 159 lb 6.3 oz  Weight (kg) 73.5 kg 72.3 kg 72.3 kg      Telemetry    Atrial flutter-  better rate control with rates in the 60-90s, in the 100s with minimal acitivity - Personally Reviewed  ECG     No new tracings - Personally Reviewed  Physical Exam   GEN: No acute distress.   Neck: No JVD Cardiac: irregular rhythm, regular rate Respiratory: lung sounds improved today, still with rhonchi, diminished  throughout GI: Soft, nontender, non-distended  MS: 2+ pitting edema LE Neuro:  Nonfocal  Psych: Normal affect   Labs    High Sensitivity Troponin:   Recent Labs  Lab 07/12/23 1434 07/12/23 2059  TROPONINIHS 59* 57*     Chemistry Recent Labs  Lab 07/18/23 1018 07/19/23 0432 07/20/23 0316 07/21/23 0315  NA 138 138 141 137  K 4.5 4.2 3.4* 3.9  CL 99 98 100 99  CO2 30 32 35* 32  GLUCOSE 127* 106* 139* 157*  BUN 25* 24* 22 22  CREATININE 0.79 0.80 0.59 0.60  CALCIUM 8.6* 8.2* 7.9* 7.7*  MG 2.4  --   --   --   GFRNONAA >60 >60 >60 >60  ANIONGAP 9 8 6 6     Lipids No results for input(s): "CHOL", "TRIG", "HDL", "LABVLDL", "LDLCALC", "CHOLHDL" in the last 168 hours.  Hematology Recent Labs  Lab 07/17/23 0238 07/21/23 0315  WBC 11.5* 14.1*  RBC 5.36* 5.26*  HGB 15.4* 15.0  HCT  50.2* 48.2*  MCV 93.7 91.6  MCH 28.7 28.5  MCHC 30.7 31.1  RDW 17.3* 17.0*  PLT 98* 91*   Thyroid No results for input(s): "TSH", "FREET4" in the last 168 hours.  BNPNo results for input(s): "BNP", "PROBNP" in the last 168 hours.  DDimer No results for input(s): "DDIMER" in the last 168 hours.   Radiology    No results found.  Cardiac Studies   Echo 07/13/23: 1. Left ventricular ejection fraction, by estimation, is 60 to 65%. The  left ventricle has normal function. The left ventricle has no regional  wall motion abnormalities. Left ventricular diastolic parameters are  consistent with Grade I diastolic  dysfunction (impaired relaxation). There is the interventricular septum is  flattened in systole and diastole, consistent with right ventricular  pressure and volume overload.   2. Right ventricular systolic function is severely reduced. The right  ventricular size is moderately enlarged. There is moderately elevated  pulmonary artery systolic pressure. The estimated right ventricular  systolic pressure is 45.9 mmHg.   3. Right atrial size was severely dilated.   4. The mitral  valve is normal in structure. Trivial mitral valve  regurgitation.   5. The aortic valve is normal in structure. There is mild calcification  of the aortic valve. Aortic valve regurgitation is not visualized.   6. The inferior vena cava is dilated in size with <50% respiratory  variability, suggesting right atrial pressure of 15 mmHg.    Patient Profile     77 y.o. female with a hx of CAD (CABG 1996), COPD, HTN, HLD, Breast CA, Lung CA; who is being seen 07/13/2023 for the evaluation of syncope and volume overload   Assessment & Plan    Acute on chronic diastolic heart failure RV dysfunction Pulmonary hypertension - echo with preserved LVEF 60-65%, no WMA, grade 1 DD, D septum consistent with RV pressure and volume overload, severe RV dysfunction, PASP 46, severe RAE - CTA negative for PE but consistent with emphysema - intolerant to CPAP - felt combined group II and III pulmonary hypertension - appears volume up today - on 40 mg lasix BID PO  - recommend compression socks - has diuresed 2 L urine output - consider discharging with additional PRN 40 mg lasix for edema and volume overload   Persistent atrial flutter Chronic anticoagulation - TEE-DCCV on 07/16/23 to SR - she reverted to flutter / fib RVR 07/19/23 - started on 400 mg PO amiodarone BID - rates continued in the 130s in flutter - she maintained adequate BP on 5 mg midodrine TID - we added 30 mg cardizem q8 hr - can consolidate this to 120 mg cardizem CD prior to discharge for ease of administration - continue 0.125 mg digoxin daily - will need a digoxin trough next week - continue 5 mg eliquis BID - hopefully she will convert to sinus as her clinical status improves - ultimately would like to wean off amiodarone if possible given her lung disease   CAD - remote CABG - no chest pain this admission   I will arrange cardiology follow up.  OK to discharge from a cardiology standpoint.  Planning for SNF in  [place].     For questions or updates, please contact [facility] Please consult [email or web address] for contact info under        Signed, Marcelino Duster, PA  07/21/2023, 8:31 AM

## 2023-07-21 NOTE — Progress Notes (Signed)
Mobility Specialist Progress Note:   07/21/23 1619  Mobility  Activity Transferred from bed to chair  Level of Assistance Minimal assist, patient does 75% or more (+2)  Assistive Device Front wheel walker  Distance Ambulated (ft) 3 ft  Activity Response Tolerated well  Mobility Referral Yes  $Mobility charge 1 Mobility  Mobility Specialist Start Time (ACUTE ONLY) 1600  Mobility Specialist Stop Time (ACUTE ONLY) 1615  Mobility Specialist Time Calculation (min) (ACUTE ONLY) 15 min   Pt received in bed, agreeable to transfer to chair. MaxA bed mobility. MinA +2 to stand and pivot to chair. Slight knee buckle when taking lateral steps to chair but no LOB present. Pt denied any discomfort during session, asx throughout. VSS on 2 L. Pt left in chair with call bell in reach and all needs met. Son present.   Leory Plowman  Mobility Specialist Please contact via [employer] office at [phone]

## 2023-07-21 NOTE — Progress Notes (Signed)
Patient refused CPAP for the night  

## 2023-07-22 DIAGNOSIS — R601 Generalized edema: Secondary | ICD-10-CM | POA: Diagnosis not present

## 2023-07-22 LAB — GLUCOSE, CAPILLARY
Glucose-Capillary: 103 mg/dL — ABNORMAL HIGH (ref 70–99)
Glucose-Capillary: 130 mg/dL — ABNORMAL HIGH (ref 70–99)
Glucose-Capillary: 146 mg/dL — ABNORMAL HIGH (ref 70–99)
Glucose-Capillary: 214 mg/dL — ABNORMAL HIGH (ref 70–99)

## 2023-07-22 LAB — BASIC METABOLIC PANEL
Anion gap: 9 (ref 5–15)
BUN: 23 mg/dL (ref 8–23)
CO2: 33 mmol/L — ABNORMAL HIGH (ref 22–32)
Calcium: 7.7 mg/dL — ABNORMAL LOW (ref 8.9–10.3)
Chloride: 95 mmol/L — ABNORMAL LOW (ref 98–111)
Creatinine, Ser: 0.7 mg/dL (ref 0.44–1.00)
GFR, Estimated: >60 mL/min (ref >60)
Glucose, Bld: 122 mg/dL — ABNORMAL HIGH (ref 70–99)
Potassium: 3.6 mmol/L (ref 3.5–5.1)
Sodium: 137 mmol/L (ref 135–145)

## 2023-07-22 LAB — CBC WITH DIFFERENTIAL/PLATELET
Abs Immature Granulocytes: 0.14 10*3/uL — ABNORMAL HIGH (ref 0.00–0.07)
Basophils Absolute: 0.1 10*3/uL (ref 0.0–0.1)
Basophils Relative: 0 %
Eosinophils Absolute: 0.1 10*3/uL (ref 0.0–0.5)
Eosinophils Relative: 0 %
HCT: 45.9 % (ref 36.0–46.0)
Hemoglobin: 14.5 g/dL (ref 12.0–15.0)
Immature Granulocytes: 1 %
Lymphocytes Relative: 3 %
Lymphs Abs: 0.5 10*3/uL — ABNORMAL LOW (ref 0.7–4.0)
MCH: 28.2 pg (ref 26.0–34.0)
MCHC: 31.6 g/dL (ref 30.0–36.0)
MCV: 89.3 fL (ref 80.0–100.0)
Monocytes Absolute: 1.3 10*3/uL — ABNORMAL HIGH (ref 0.1–1.0)
Monocytes Relative: 9 %
Neutro Abs: 12.3 10*3/uL — ABNORMAL HIGH (ref 1.7–7.7)
Neutrophils Relative %: 87 %
Platelets: 107 10*3/uL — ABNORMAL LOW (ref 150–400)
RBC: 5.14 MIL/uL — ABNORMAL HIGH (ref 3.87–5.11)
RDW: 16.9 % — ABNORMAL HIGH (ref 11.5–15.5)
WBC: 14.3 10*3/uL — ABNORMAL HIGH (ref 4.0–10.5)
nRBC: 0 % (ref 0.0–0.2)

## 2023-07-22 NOTE — Plan of Care (Signed)
   Problem: Education: Goal: Ability to describe self-care measures that may prevent or decrease complications (Diabetes Survival Skills Education) will improve Outcome: Progressing Goal: Individualized Educational Video(s) Outcome: Progressing   Problem: Coping: Goal: Ability to adjust to condition or change in health will improve Outcome: Progressing   Problem: Fluid Volume: Goal: Ability to maintain a balanced intake and output will improve Outcome: Progressing   Problem: Health Behavior/Discharge Planning: Goal: Ability to identify and utilize available resources and services will improve Outcome: Progressing Goal: Ability to manage health-related needs will improve Outcome: Progressing   Problem: Metabolic: Goal: Ability to maintain appropriate glucose levels will improve Outcome: Progressing   Problem: Nutritional: Goal: Maintenance of adequate nutrition will improve Outcome: Progressing Goal: Progress toward achieving an optimal weight will improve Outcome: Progressing   Problem: Skin Integrity: Goal: Risk for impaired skin integrity will decrease Outcome: Progressing   Problem: Tissue Perfusion: Goal: Adequacy of tissue perfusion will improve Outcome: Progressing   Problem: Education: Goal: Knowledge of General Education information will improve Description: Including pain rating scale, medication(s)/side effects and non-pharmacologic comfort measures Outcome: Progressing   Problem: Health Behavior/Discharge Planning: Goal: Ability to manage health-related needs will improve Outcome: Progressing   Problem: Clinical Measurements: Goal: Ability to maintain clinical measurements within normal limits will improve Outcome: Progressing Goal: Will remain free from infection Outcome: Progressing Goal: Diagnostic test results will improve Outcome: Progressing Goal: Respiratory complications will improve Outcome: Progressing Goal: Cardiovascular complication will  be avoided Outcome: Progressing   Problem: Activity: Goal: Risk for activity intolerance will decrease Outcome: Progressing

## 2023-07-22 NOTE — Progress Notes (Signed)
Physical Therapy Treatment Patient Details Name: Denise Mckenzie MRN: [id number] DOB: 05-28-1946 Today's Date: 07/22/2023   History of Present Illness Pt is a 77 yo female admitted 11/25 for syncope, decompensated HFpEF. 11/29 TEE DCCV. 12/2 back in AFib. PMH: CAD, CABG in 1996, COPD, HTN, HLD, breast cancer, lung cancer    PT Comments  Pt pleasant and reports increased bil thigh edema but able to walk 12' as opposed to 3' from prior sessions. Pt with increased difficulty with bed mobility and transfers with education for repeated transfers and HEP. Pt encouraged to be OOB for meals and to use Stedy for ease of transfers with nursing staff. Will continue to follow. Patient will benefit from continued inpatient follow up therapy, <3 hours/day  SPO2 95% on 2L at rest, required 4L with limited mobility to maintain 90% HR 104-135    If plan is discharge home, recommend the following: Two people to help with walking and/or transfers;A little help with bathing/dressing/bathroom;Assistance with cooking/housework;Help with stairs or ramp for entrance;Assist for transportation   Can travel by private vehicle     No  Equipment Recommendations  Wheelchair (measurements PT);Wheelchair cushion (measurements PT);BSC/3in1    Recommendations for Other Services       Precautions / Restrictions Precautions Precautions: Fall;Other (comment) Precaution Comments: watch sats     Mobility  Bed Mobility Overal bed mobility: Needs Assistance Bed Mobility: Supine to Sit     Supine to sit: Max assist, HOB elevated     General bed mobility comments: HOB 30 degrees, max assist to pivot to EOB with mod cues for seqeunce and clearing legs off side of bed, use of rail and increased time. posterior lean in sitting requiring min assist to maintain    Transfers Overall transfer level: Needs assistance   Transfers: Sit to/from Stand Sit to Stand: Mod assist, +2 physical assistance           General  transfer comment: 3 standing attempts with mod +2 to stand first 2 trials, max +2 3rd trial. Pt with lack of control for descent despite cues for hand placement    Ambulation/Gait Ambulation/Gait assistance: Mod assist, +2 safety/equipment Gait Distance (Feet): 12 Feet Assistive device: Rolling walker (2 wheels) Gait Pattern/deviations: Shuffle, Trunk flexed   Gait velocity interpretation: <1.8 ft/sec, indicate of risk for recurrent falls   General Gait Details: very short steps, constant cueing for facilitation of ambulating, pt able to walk 12' with close chair follow, cues for proximity to RW and safety. Fatigued after one trial and unale to progress to further attempts   Comptroller Bed    Modified Rankin (Stroke Patients Only)       Balance Overall balance assessment: Needs assistance Sitting-balance support: Feet supported Sitting balance-Leahy Scale: Poor Sitting balance - Comments: min assist for sitting balance with mod cues Postural control: Posterior lean Standing balance support: Reliant on assistive device for balance, Bilateral upper extremity supported Standing balance-Leahy Scale: Poor Standing balance comment: reliant on RW and therapist                            Cognition Arousal: Alert Behavior During Therapy: WFL for tasks assessed/performed Overall Cognitive Status: Impaired/Different from baseline Area of Impairment: Safety/judgement  Safety/Judgement: Decreased awareness of deficits              Exercises General Exercises - Lower Extremity Long Arc Quad: AROM, Both, 10 reps, Seated, Strengthening Hip Flexion/Marching: AROM, Both, 10 reps, Seated, Strengthening    General Comments        Pertinent Vitals/Pain Pain Assessment Pain Assessment: 0-10 Pain Score: 3  Pain Location: knees Pain Descriptors / Indicators: Aching Pain Intervention(s):  Limited activity within patient's tolerance, Repositioned, Monitored during session    Home Living                          Prior Function            PT Goals (current goals can now be found in the care plan section) Progress towards PT goals: Progressing toward goals    Frequency    Min 1X/week      PT Plan      Co-evaluation              AM-PAC PT "6 Clicks" Mobility   Outcome Measure  Help needed turning from your back to your side while in a flat bed without using bedrails?: A Little Help needed moving from lying on your back to sitting on the side of a flat bed without using bedrails?: A Lot Help needed moving to and from a bed to a chair (including a wheelchair)?: A Lot Help needed standing up from a chair using your arms (e.g., wheelchair or bedside chair)?: A Lot Help needed to walk in hospital room?: Total Help needed climbing 3-5 steps with a railing? : Total 6 Click Score: 11    End of Session Equipment Utilized During Treatment: Gait belt;Oxygen Activity Tolerance: Patient tolerated treatment well Patient left: in chair;with call bell/phone within reach;with family/visitor present;with chair alarm set Nurse Communication: Mobility status PT Visit Diagnosis: Unsteadiness on feet (R26.81);Other abnormalities of gait and mobility (R26.89);Muscle weakness (generalized) (M62.81);Repeated falls (R29.6)     Time: 8295-6213 PT Time Calculation (min) (ACUTE ONLY): 29 min  Charges:    $Gait Training: 8-22 mins $Therapeutic Activity: 8-22 mins PT General Charges $$ ACUTE PT VISIT: 1 Visit                     Merryl Hacker, PT Acute Rehabilitation Services Office: [phone]    Enedina Finner Mikeyla Music 07/22/2023, 1:35 PM

## 2023-07-22 NOTE — TOC Progression Note (Signed)
Transition of Care [facility]) - Progression Note    Patient Details  Name: Denise Mckenzie MRN: [id number] Date of Birth: 1946-02-08  [facility] CM/SW Contact  Eduard Roux, [place] Phone Number: 07/22/2023, 11:13 AM  Clinical Narrative:     Per MD- patient is not stable for d/c to SNF. CSW updated SNF  TOC will continue to follow and assist with discharge planning.  Antony Blackbird, MSW, LCSW Clinical Social Worker    Expected Discharge Plan: Skilled Nursing Facility Barriers to Discharge: SNF Pending bed offer, Insurance Authorization  Expected Discharge Plan and Services       Living arrangements for the past 2 months: Single Family Home                                       Social Determinants of Health (SDOH) Interventions SDOH Screenings   Food Insecurity: Food Insecurity Present (07/13/2023)  Housing: Low Risk  (07/13/2023)  Transportation Needs: No Transportation Needs (07/13/2023)  Utilities: Not At Risk (07/13/2023)  Tobacco Use: Medium Risk (07/16/2023)    Readmission Risk Interventions     No data to display

## 2023-07-22 NOTE — Progress Notes (Signed)
PROGRESS NOTE    Denise Mckenzie  GNF:[id number] DOB: 03-04-1946 DOA: 07/12/2023 PCP: Burton Apley, MD     Brief Narrative:  Denise Mckenzie is a 77 year old female with history of CAD, CABG in 1996, COPD, hypertension, hyperlipidemia, breast cancer, lung cancer transferred after having an episode of syncope.  Patient has been having worsening of the lower extremity edema, weight increase from 129 lb to 148 lb, worsening of the shortness of breath. Patient was admitted for CHF exacerbation.  Echocardiogram revealed severe right heart failure with RV dilation.  She was treated with IV diuresis.  Cardiology consulted.  Patient also underwent TEE/DCCV for A-fib and converted to normal sinus rhythm 11/29. However on 12/2, patient went back into A-fib RVR.  She was started on amiodarone.   New events last 24 hours / Subjective: Pt still appearing overloaded, but reports some improvement.  Noted very mild wheezing bilaterally.  Deconditioned.  Denied any chest pain, worsening shortness of breath, fever/chills.   Assessment & Plan:  Principal Problem:   Anasarca Active Problems:   Acute exacerbation of CHF (congestive heart failure) (HCC)   Acute heart failure with preserved ejection fraction (HFpEF) (HCC)   Acute right heart failure (HCC)   Atrial flutter, paroxysmal (HCC)   Hx of CABG   PAF (paroxysmal atrial fibrillation) (HCC)   Acute on chronic diastolic heart failure, right heart failure Severe pulmonary hypertension with right heart failure CTA chest negative for PE Echocardiogram: LVEF 60-65%, no WMAs, grade I dd, D septum consistent with RV pressure and volume overload, severe RV dysfunction, PASP 46, severe RAE  Cardiology consulted Possible right heart cath, to be arranged outpatient by Dr. Bing Matter Continue Lasix 40 mg twice daily p.o. Daily weight, strict I's and O's  Paroxysmal A-fib RVR Status post TEE/DCCV 11/29, converted to normal sinus rhythm, went back into A-fib RVR  12/2, started on amiodarone, Cardizem.  Continue digoxin, Eliquis  COPD exacerbation Solu-Medrol --> prednisone, nebs  Acute hypoxic, hypercarbic respiratory failure CPAP nightly, poor tolerance Recommend outpatient sleep study  Hypotension Midodrine  Hyperlipidemia Lipitor  Hypothyroidism Synthroid  Hypokalemia Replace prn    DVT prophylaxis:  SCDs Start: 07/12/23 2114 apixaban (ELIQUIS) tablet 5 mg    Code Status: Full code Family Communication: None at bedside Disposition Plan: SNF Status is: Inpatient Remains inpatient appropriate because: SNF placement    Antimicrobials:  Anti-infectives (From admission, onward)    Start     Dose/Rate Route Frequency Ordered Stop   07/16/23 1445  cefTRIAXone (ROCEPHIN) 2 g in sodium chloride 0.9 % 100 mL IVPB        2 g 200 mL/hr over 30 Minutes Intravenous Every 24 hours 07/16/23 1351 07/20/23 0715   07/16/23 0000  vancomycin (VANCOREADY) IVPB 750 mg/150 mL  Status:  Discontinued        750 mg 150 mL/hr over 60 Minutes Intravenous Every 24 hours 07/15/23 1237 07/16/23 1351   07/15/23 1400  ceFEPIme (MAXIPIME) 2 g in sodium chloride 0.9 % 100 mL IVPB  Status:  Discontinued        2 g 200 mL/hr over 30 Minutes Intravenous Every 12 hours 07/15/23 1233 07/16/23 1351   07/14/23 0200  vancomycin (VANCOREADY) IVPB 500 mg/100 mL  Status:  Discontinued        500 mg 100 mL/hr over 60 Minutes Intravenous Every 24 hours 07/13/23 0047 07/15/23 1237   07/13/23 0200  ceFEPIme (MAXIPIME) 2 g in sodium chloride 0.9 % 100 mL IVPB  Status:  Discontinued        2 g 200 mL/hr over 30 Minutes Intravenous Every 24 hours 07/13/23 0047 07/15/23 1233   07/13/23 0200  vancomycin (VANCOREADY) IVPB 1500 mg/300 mL        1,500 mg 150 mL/hr over 120 Minutes Intravenous  Once 07/13/23 0047 07/13/23 0412        Objective: Vitals:   07/22/23 1215 07/22/23 1226 07/22/23 1440 07/22/23 1633  BP: 123/71   (!) 128/59  Pulse: 60   85  Resp: 20   (!)  21  Temp: 98.3 F (36.8 C)   99 F (37.2 C)  TempSrc: Oral   Oral  SpO2: 93% 92% 97% 97%  Weight:      Height:        Intake/Output Summary (Last 24 hours) at 07/22/2023 1847 Last data filed at 07/22/2023 1642 Gross per 24 hour  Intake 480 ml  Output 700 ml  Net -220 ml   Filed Weights   07/20/23 1347 07/21/23 0431 07/22/23 0528  Weight: 72.3 kg 73.5 kg 77.7 kg    Examination:  General: NAD Cardiovascular: S1, S2 present Respiratory: Diminished breath sounds, mild bilateral wheezing Abdomen: Soft, nontender, nondistended, bowel sounds present, anasarca Musculoskeletal: bilateral pedal edema noted Skin: Normal Psychiatry: Normal mood   Data Reviewed: I have personally reviewed following labs and imaging studies  CBC: Recent Labs  Lab 07/17/23 0238 07/21/23 0315 07/22/23 0249  WBC 11.5* 14.1* 14.3*  NEUTROABS  --   --  12.3*  HGB 15.4* 15.0 14.5  HCT 50.2* 48.2* 45.9  MCV 93.7 91.6 89.3  PLT 98* 91* 107*   Basic Metabolic Panel: Recent Labs  Lab 07/18/23 1018 07/19/23 0432 07/20/23 0316 07/21/23 0315 07/22/23 0249  NA 138 138 141 137 137  K 4.5 4.2 3.4* 3.9 3.6  CL 99 98 100 99 95*  CO2 30 32 35* 32 33*  GLUCOSE 127* 106* 139* 157* 122*  BUN 25* 24* 22 22 23   CREATININE 0.79 0.80 0.59 0.60 0.70  CALCIUM 8.6* 8.2* 7.9* 7.7* 7.7*  MG 2.4  --   --   --   --    GFR: Estimated Creatinine Clearance: 55 mL/min (by C-G formula based on SCr of 0.7 mg/dL). Liver Function Tests: No results for input(s): "AST", "ALT", "ALKPHOS", "BILITOT", "PROT", "ALBUMIN" in the last 168 hours.  No results for input(s): "LIPASE", "AMYLASE" in the last 168 hours. No results for input(s): "AMMONIA" in the last 168 hours. Coagulation Profile: No results for input(s): "INR", "PROTIME" in the last 168 hours.  Cardiac Enzymes: No results for input(s): "CKTOTAL", "CKMB", "CKMBINDEX", "TROPONINI" in the last 168 hours. BNP (last 3 results) Recent Labs    02/22/23 1136  07/02/23 1645 07/09/23 1131  PROBNP 5,414* 21,913* 33,547*   HbA1C: No results for input(s): "HGBA1C" in the last 72 hours. CBG: Recent Labs  Lab 07/21/23 1642 07/21/23 2128 07/22/23 0600 07/22/23 1221 07/22/23 1655  GLUCAP 169* 158* 103* 130* 146*   Lipid Profile: No results for input(s): "CHOL", "HDL", "LDLCALC", "TRIG", "CHOLHDL", "LDLDIRECT" in the last 72 hours. Thyroid Function Tests: No results for input(s): "TSH", "T4TOTAL", "FREET4", "T3FREE", "THYROIDAB" in the last 72 hours. Anemia Panel: No results for input(s): "VITAMINB12", "FOLATE", "FERRITIN", "TIBC", "IRON", "RETICCTPCT" in the last 72 hours. Sepsis Labs: No results for input(s): "PROCALCITON", "LATICACIDVEN" in the last 168 hours.   Recent Results (from the past 240 hour(s))  Resp panel by RT-PCR (RSV, Flu A&B, Covid) Anterior Nasal Swab  Status: None   Collection Time: 07/12/23  9:52 PM   Specimen: Anterior Nasal Swab  Result Value Ref Range Status   SARS Coronavirus 2 by RT PCR NEGATIVE NEGATIVE Final   Influenza A by PCR NEGATIVE NEGATIVE Final   Influenza B by PCR NEGATIVE NEGATIVE Final    Comment: (NOTE) The Xpert Xpress SARS-CoV-2/FLU/RSV plus assay is intended as an aid in the diagnosis of influenza from Nasopharyngeal swab specimens and should not be used as a sole basis for treatment. Nasal washings and aspirates are unacceptable for Xpert Xpress SARS-CoV-2/FLU/RSV testing.  Fact Sheet for Patients: BloggerCourse.com  Fact Sheet for Healthcare Providers: SeriousBroker.it  This test is not yet approved or cleared by the [place] FDA and has been authorized for detection and/or diagnosis of SARS-CoV-2 by FDA under an Emergency Use Authorization (EUA). This EUA will remain in effect (meaning this test can be used) for the duration of the COVID-19 declaration under Section 564(b)(1) of the Act, 21 U.S.C. section 360bbb-3(b)(1), unless  the authorization is terminated or revoked.     Resp Syncytial Virus by PCR NEGATIVE NEGATIVE Final    Comment: (NOTE) Fact Sheet for Patients: BloggerCourse.com  Fact Sheet for Healthcare Providers: SeriousBroker.it  This test is not yet approved or cleared by the [place] FDA and has been authorized for detection and/or diagnosis of SARS-CoV-2 by FDA under an Emergency Use Authorization (EUA). This EUA will remain in effect (meaning this test can be used) for the duration of the COVID-19 declaration under Section 564(b)(1) of the Act, 21 U.S.C. section 360bbb-3(b)(1), unless the authorization is terminated or revoked.  Performed at [facility], [address] [place], [place] [address]   Culture, blood (Routine X 2) w Reflex to ID Panel     Status: None   Collection Time: 07/13/23 12:43 AM   Specimen: BLOOD RIGHT HAND  Result Value Ref Range Status   Specimen Description BLOOD RIGHT HAND  Final   Special Requests   Final    BOTTLES DRAWN AEROBIC AND ANAEROBIC Blood Culture adequate volume   Culture   Final    NO GROWTH 5 DAYS Performed at [facility], [address], [place], [place] [address]    Report Status 07/18/2023 FINAL  Final  Culture, blood (Routine X 2) w Reflex to ID Panel     Status: None   Collection Time: 07/13/23  3:37 AM   Specimen: BLOOD RIGHT HAND  Result Value Ref Range Status   Specimen Description BLOOD RIGHT HAND  Final   Special Requests   Final    BOTTLES DRAWN AEROBIC AND ANAEROBIC Blood Culture adequate volume   Culture   Final    NO GROWTH 5 DAYS Performed at [facility], [address] [place], [place] [address]    Report Status 07/18/2023 FINAL  Final  MRSA Next Gen by PCR, Nasal     Status: None   Collection Time: 07/14/23 12:30 PM   Specimen: Nasal Mucosa; Nasal Swab  Result Value Ref Range Status   MRSA by PCR Next Gen NOT DETECTED NOT DETECTED Final     Comment: (NOTE) The GeneXpert MRSA Assay (FDA approved for NASAL specimens only), is one component of a comprehensive MRSA colonization surveillance program. It is not intended to diagnose MRSA infection nor to guide or monitor treatment for MRSA infections. Test performance is not FDA approved in patients less than 13 years old. Performed at [facility], [address], [place], [place] [address]       Radiology Studies: No results found.    Scheduled Meds:  amiodarone  400 mg Oral BID   amitriptyline  50 mg Oral QHS   apixaban  5 mg Oral BID   arformoterol  15 mcg Nebulization BID   aspirin EC  81 mg Oral Daily   atorvastatin  40 mg Oral QHS   budesonide (PULMICORT) nebulizer solution  0.25 mg Nebulization BID   digoxin  0.125 mg Oral Daily   diltiazem  30 mg Oral Q8H   docusate sodium  100 mg Oral BID   furosemide  40 mg Oral BID   guaiFENesin  600 mg Oral BID   insulin aspart  0-6 Units Subcutaneous TID WC   lactulose  20 g Oral BID   levalbuterol  0.63 mg Nebulization Q6H   levothyroxine  88 mcg Oral Q0600   midodrine  5 mg Oral TID WC   predniSONE  50 mg Oral Q breakfast   sodium chloride flush  3 mL Intravenous Q12H   Continuous Infusions:     LOS: 10 days     Briant Cedar, MD [facility] 07/22/2023, 6:47 PM   Available via Epic secure chat 7am-7pm After these hours, please refer to coverage provider listed on [email or web address]

## 2023-07-23 ENCOUNTER — Ambulatory Visit: Payer: Medicare Other | Admitting: Cardiology

## 2023-07-23 DIAGNOSIS — R601 Generalized edema: Secondary | ICD-10-CM | POA: Diagnosis not present

## 2023-07-23 LAB — CBC WITH DIFFERENTIAL/PLATELET
Abs Immature Granulocytes: 0.17 10*3/uL — ABNORMAL HIGH (ref 0.00–0.07)
Basophils Absolute: 0.1 10*3/uL (ref 0.0–0.1)
Basophils Relative: 0 %
Eosinophils Absolute: 0.1 10*3/uL (ref 0.0–0.5)
Eosinophils Relative: 0 %
HCT: 45.7 % (ref 36.0–46.0)
Hemoglobin: 14.7 g/dL (ref 12.0–15.0)
Immature Granulocytes: 1 %
Lymphocytes Relative: 3 %
Lymphs Abs: 0.5 10*3/uL — ABNORMAL LOW (ref 0.7–4.0)
MCH: 28.9 pg (ref 26.0–34.0)
MCHC: 32.2 g/dL (ref 30.0–36.0)
MCV: 90 fL (ref 80.0–100.0)
Monocytes Absolute: 1.3 10*3/uL — ABNORMAL HIGH (ref 0.1–1.0)
Monocytes Relative: 9 %
Neutro Abs: 12.7 10*3/uL — ABNORMAL HIGH (ref 1.7–7.7)
Neutrophils Relative %: 87 %
Platelets: 115 10*3/uL — ABNORMAL LOW (ref 150–400)
RBC: 5.08 MIL/uL (ref 3.87–5.11)
RDW: 16.7 % — ABNORMAL HIGH (ref 11.5–15.5)
WBC: 14.8 10*3/uL — ABNORMAL HIGH (ref 4.0–10.5)
nRBC: 0 % (ref 0.0–0.2)

## 2023-07-23 LAB — GLUCOSE, CAPILLARY
Glucose-Capillary: 170 mg/dL — ABNORMAL HIGH (ref 70–99)
Glucose-Capillary: 102 mg/dL — ABNORMAL HIGH (ref 70–99)

## 2023-07-23 LAB — BASIC METABOLIC PANEL
Anion gap: 8 (ref 5–15)
BUN: 18 mg/dL (ref 8–23)
CO2: 35 mmol/L — ABNORMAL HIGH (ref 22–32)
Calcium: 7.6 mg/dL — ABNORMAL LOW (ref 8.9–10.3)
Chloride: 94 mmol/L — ABNORMAL LOW (ref 98–111)
Creatinine, Ser: 0.57 mg/dL (ref 0.44–1.00)
GFR, Estimated: >60 mL/min (ref >60)
Glucose, Bld: 110 mg/dL — ABNORMAL HIGH (ref 70–99)
Potassium: 3.4 mmol/L — ABNORMAL LOW (ref 3.5–5.1)
Sodium: 137 mmol/L (ref 135–145)

## 2023-07-23 MED ORDER — PREDNISONE 10 MG PO TABS: ORAL_TABLET | ORAL | Status: AC

## 2023-07-23 MED ORDER — DIGOXIN 125 MCG PO TABS: 0.1250 mg | ORAL_TABLET | Freq: Every day | ORAL | Status: DC

## 2023-07-23 MED ORDER — AMIODARONE HCL 400 MG PO TABS: 400.0000 mg | ORAL_TABLET | Freq: Two times a day (BID) | ORAL | Status: DC

## 2023-07-23 MED ORDER — MIDODRINE HCL 5 MG PO TABS: 5.0000 mg | ORAL_TABLET | Freq: Three times a day (TID) | ORAL | Status: DC

## 2023-07-23 MED ORDER — LEVALBUTEROL HCL 0.63 MG/3ML IN NEBU: 0.6300 mg | INHALATION_SOLUTION | Freq: Four times a day (QID) | RESPIRATORY_TRACT | Status: DC | PRN

## 2023-07-23 MED ORDER — LACTULOSE 10 GM/15ML PO SOLN: 20.0000 g | Freq: Two times a day (BID) | ORAL | Status: DC | PRN

## 2023-07-23 MED ORDER — DILTIAZEM HCL ER COATED BEADS 120 MG PO CP24: 120.0000 mg | ORAL_CAPSULE | Freq: Every day | ORAL | Status: DC

## 2023-07-23 MED ORDER — APIXABAN 5 MG PO TABS: 5.0000 mg | ORAL_TABLET | Freq: Two times a day (BID) | ORAL | Status: DC

## 2023-07-23 MED ORDER — POTASSIUM CHLORIDE CRYS ER 20 MEQ PO TBCR
40.0000 meq | EXTENDED_RELEASE_TABLET | Freq: Once | ORAL | Status: AC
  Administered 2023-07-23: 40 meq via ORAL
  Filled 2023-07-23: qty 2

## 2023-07-23 MED ORDER — DILTIAZEM HCL ER COATED BEADS 120 MG PO CP24
120.0000 mg | ORAL_CAPSULE | Freq: Every day | ORAL | Status: DC
  Administered 2023-07-23: 120 mg via ORAL
  Filled 2023-07-23: qty 1

## 2023-07-23 MED ORDER — DOCUSATE SODIUM 100 MG PO CAPS: 100.0000 mg | ORAL_CAPSULE | Freq: Two times a day (BID) | ORAL | Status: DC

## 2023-07-23 MED ORDER — MOMETASONE FURO-FORMOTEROL FUM 200-5 MCG/ACT IN AERO: 2.0000 | INHALATION_SPRAY | Freq: Two times a day (BID) | RESPIRATORY_TRACT | Status: DC

## 2023-07-23 NOTE — Progress Notes (Signed)
Order received to discharge patient.  Telemetry monitor removed and CCMD notified.  PIV acces x1 removed without difficulty.  Report called to receiving RN at [facility].

## 2023-07-23 NOTE — Progress Notes (Signed)
Physical Therapy Treatment Patient Details Name: Denise Mckenzie MRN: [id number] DOB: Oct 12, 1945 Today's Date: 07/23/2023   History of Present Illness Pt is a 77 yo female admitted 11/25 for syncope, decompensated HFpEF. 11/29 TEE DCCV. 12/2 back in AFib. PMH: CAD, CABG in 1996, COPD, HTN, HLD, breast cancer, lung cancer    PT Comments  Pt continues to require significant assist for all mobility. Pt anxious with mobility due to recent fall. Pt is making slow progress and will benefit from continued inpatient follow up therapy, <3 hours/day.     If plan is discharge home, recommend the following: Two people to help with walking and/or transfers;A little help with bathing/dressing/bathroom;Assistance with cooking/housework;Help with stairs or ramp for entrance;Assist for transportation   Can travel by private vehicle     No  Equipment Recommendations  Wheelchair (measurements PT);Wheelchair cushion (measurements PT);BSC/3in1    Recommendations for Other Services       Precautions / Restrictions Precautions Precautions: Fall;Other (comment) Precaution Comments: watch sats Restrictions Weight Bearing Restrictions: No     Mobility  Bed Mobility Overal bed mobility: Needs Assistance Bed Mobility: Supine to Sit     Supine to sit: Mod assist, Used rails, HOB elevated     General bed mobility comments: Assist to bring legs off of bed, elevate trunk into sitting and bring hips to EOB    Transfers Overall transfer level: Needs assistance Equipment used: Ambulation equipment used, Rolling walker (2 wheels) Transfers: Sit to/from Stand Sit to Stand: +2 physical assistance, Mod assist           General transfer comment: Stood from bed with Stedy and transferred to chair. From Chair stood with walker x 2 with initial posterior bias.    Ambulation/Gait Ambulation/Gait assistance: Mod assist, +2 physical assistance, +2 safety/equipment Gait Distance (Feet): 1 Feet Assistive  device: Rolling walker (2 wheels) Gait Pattern/deviations: Decreased step length - right, Decreased step length - left, Trunk flexed, Shuffle Gait velocity: decr Gait velocity interpretation: <1.31 ft/sec, indicative of household ambulator   General Gait Details: Legs very tremorous and only able to shuffle a couple of steps   Stairs             Wheelchair Mobility     Tilt Bed    Modified Rankin (Stroke Patients Only)       Balance Overall balance assessment: Needs assistance Sitting-balance support: Feet supported Sitting balance-Leahy Scale: Fair     Standing balance support: Reliant on assistive device for balance, Bilateral upper extremity supported Standing balance-Leahy Scale: Poor Standing balance comment: Walker and min to mod assist for static standing                            Cognition Arousal: Alert Behavior During Therapy: WFL for tasks assessed/performed, Anxious Overall Cognitive Status: Impaired/Different from baseline                                 General Comments: anxious with mobility and fearful of falling        Exercises      General Comments General comments (skin integrity, edema, etc.): VSS on 2L      Pertinent Vitals/Pain Pain Assessment Pain Assessment: No/denies pain    Home Living  Prior Function            PT Goals (current goals can now be found in the care plan section) Progress towards PT goals: Not progressing toward goals - comment    Frequency    Min 1X/week      PT Plan      Co-evaluation              AM-PAC PT "6 Clicks" Mobility   Outcome Measure  Help needed turning from your back to your side while in a flat bed without using bedrails?: A Little Help needed moving from lying on your back to sitting on the side of a flat bed without using bedrails?: A Lot Help needed moving to and from a bed to a chair (including a  wheelchair)?: Total Help needed standing up from a chair using your arms (e.g., wheelchair or bedside chair)?: Total Help needed to walk in hospital room?: Total   6 Click Score: 8    End of Session Equipment Utilized During Treatment: Gait belt;Oxygen Activity Tolerance: Patient limited by fatigue Patient left: in chair;with call bell/phone within reach;with family/visitor present;with chair alarm set Nurse Communication: Mobility status;Need for lift equipment PT Visit Diagnosis: Unsteadiness on feet (R26.81);Other abnormalities of gait and mobility (R26.89);Muscle weakness (generalized) (M62.81);Repeated falls (R29.6)     Time: 1137-1201 PT Time Calculation (min) (ACUTE ONLY): 24 min  Charges:    $Therapeutic Activity: 23-37 mins PT General Charges $$ ACUTE PT VISIT: 1 Visit                     [facility] PT Acute Rehabilitation Services Office [phone]    Angelina Ok [facility] 07/23/2023, 12:29 PM

## 2023-07-23 NOTE — TOC Transition Note (Signed)
Transition of Care [facility]) - CM/SW Discharge Note   Patient Details  Name: Denise Mckenzie MRN: [id number] Date of Birth: [birth year]  [facility] CM/SW Contact:  Eduard Roux, LCSW Phone Number: 07/23/2023, 1:21 PM   Clinical Narrative:     Patient will Discharge [id number]  Discharge Date: 07/23/2023 Family Notified: Son Transport NW:[facility]  Per MD patient is ready for discharge. RN, patient, and facility notified of discharge. Discharge Summary sent to facility. RN given number for report613 [phone]. Ambulance transport requested for patient.   Clinical Social Worker signing off.  Antony Blackbird, MSW, LCSW Clinical Social Worker     Final next level of care: Skilled Nursing Facility Barriers to Discharge: Barriers Resolved   Patient Goals and CMS Choice CMS Medicare.gov Compare Post Acute Care list provided to:: Patient Choice offered to / list presented to : Patient  Discharge Placement                Patient chooses bed at: [facility] Patient to be transferred to facility by: [facility] Name of family member notified: son Patient and family notified of of transfer: 07/23/23  Discharge Plan and Services Additional resources added to the After Visit Summary for                                       Social Determinants of Health (SDOH) Interventions SDOH Screenings   Food Insecurity: Food Insecurity Present (07/13/2023)  Housing: Low Risk  (07/13/2023)  Transportation Needs: No Transportation Needs (07/13/2023)  Utilities: Not At Risk (07/13/2023)  Tobacco Use: Medium Risk (07/16/2023)     Readmission Risk Interventions     No data to display

## 2023-07-23 NOTE — Progress Notes (Signed)
   07/23/23 1522  What Happened  Was fall witnessed? Yes  Who witnessed fall? transport service  Patients activity before fall to/from bed, chair, or stretcher  Point of contact buttocks  Was patient injured? No  Provider Notification  Provider Name/Title Dr. Sharolyn Douglas  Date Provider Notified 07/23/23  Time Provider Notified 1525  Notification Reason Fall  Follow Up  Family notified No - patient refusal  Additional tests No  Progress note created (see row info) Yes  Adult Fall Risk Assessment  Risk Factor Category (scoring not indicated) Fall has occurred during this admission (document High fall risk)  Age 77  Fall History: Fall within 6 months prior to admission 5  Elimination; Bowel and/or Urine Incontinence 0  Elimination; Bowel and/or Urine Urgency/Frequency 0  Medications: includes PCA/Opiates, Anti-convulsants, Anti-hypertensives, Diuretics, Hypnotics, Laxatives, Sedatives, and Psychotropics 3  Patient Care Equipment 1  Mobility-Assistance 2  Mobility-Gait 2  Mobility-Sensory Deficit 0  Altered awareness of immediate physical environment 0  Impulsiveness 0  Lack of understanding of one's physical/cognitive limitations 0  Total Score 15  Patient Fall Risk Level High fall risk  Adult Fall Risk Interventions  Required Bundle Interventions *See Row Information* High fall risk - low, moderate, and high requirements implemented  Additional Interventions Use of appropriate toileting equipment (bedpan, BSC, etc.)  Fall intervention(s) refused/Patient educated regarding refusal Bed alarm;Nonskid socks  Screening for Fall Injury Risk (To be completed on HIGH fall risk patients) - Assessing Need for Floor Mats  Risk For Fall Injury- Criteria for Floor Mats Previous fall this admission  Will Implement Floor Mats Yes  Vitals  Temp 99.3 F (37.4 C)  Temp Source Oral  BP 123/75  BP Location Right Arm  BP Method Automatic  Patient Position (if appropriate) Sitting  Pulse Rate 94   Pulse Rate Source Monitor  Resp 16  Oxygen Therapy  SpO2 94 %  O2 Device Nasal Cannula  O2 Flow Rate (L/min) 2 L/min  Patient Activity (if Appropriate) In chair  Pain Assessment  Pain Scale 0-10  Pain Score 0  Neurological  Neuro (WDL) WDL  Level of Consciousness Alert  Musculoskeletal  Musculoskeletal (WDL) X  Assistive Device Stedy  Generalized Weakness Yes  Weight Bearing Restrictions No  Integumentary  Integumentary (WDL) WDL  Skin Color Appropriate for ethnicity  Skin Condition Dry  Skin Integrity Intact  Skin Turgor Non-tenting

## 2023-07-23 NOTE — Progress Notes (Signed)
While [facility] was attempting to transfer pt from chair to stretcher, patient's legs buckled under her and she was aided to a sitting position on the floor.  Vital signs stable. Patient with no complaints of pain.  MD made aware.  Maxi-lift was used to lift patient to stretcher and she was transported to SNF.

## 2023-07-23 NOTE — Discharge Summary (Signed)
Physician Discharge Summary   Patient: Denise Mckenzie MRN: [id number] DOB: 1946-01-22  Admit date:     07/12/2023  Discharge date: 07/23/23  Discharge Physician: Briant Cedar   PCP: Burton Apley, MD   Recommendations at discharge:   Follow-up with PCP in 1 week Follow-up with cardiology as scheduled  Discharge Diagnoses: Principal Problem:   Anasarca Active Problems:   Acute exacerbation of CHF (congestive heart failure) (HCC)   Acute heart failure with preserved ejection fraction (HFpEF) (HCC)   Acute right heart failure (HCC)   Atrial flutter, paroxysmal (HCC)   Hx of CABG   PAF (paroxysmal atrial fibrillation) [facility])    Hospital Course: NGELA Mckenzie is a 77 year old female with history of CAD, CABG in 1996, COPD, hypertension, hyperlipidemia, breast cancer, lung cancer transferred after having an episode of syncope.  Patient has been having worsening of the lower extremity edema, weight increase from 129 lb to 148 lb, worsening of the shortness of breath. Patient was admitted for CHF exacerbation.  Echocardiogram revealed severe right heart failure with RV dilation.  She was treated with IV diuresis.  Cardiology consulted.  Patient also underwent TEE/DCCV for A-fib and converted to normal sinus rhythm 11/29. However on 12/2, patient went back into A-fib RVR.  She was started on amiodarone.  Patient rate now controlled.    Today, patient reports feeling much better, participated with PT/OT.  Bilateral leg edema has resolved significantly with diuretics as well as Unna boots.  Denies any chest pain, worsening shortness of breath, abdominal pain, nausea/vomiting, fever/chills.    Assessment and Plan:  Acute on chronic diastolic heart failure, right heart failure Severe pulmonary hypertension with right heart failure CTA chest negative for PE Echocardiogram: LVEF 60-65%, no WMAs, grade I dd, D septum consistent with RV pressure and volume overload, severe RV  dysfunction, PASP 46, severe RAE  Cardiology consulted, appreciate recs, outpatient follow-up scheduled Possible right heart cath, to be arranged outpatient by Dr. Bing Matter Continue PTA torsemide, Roland Rack boots for BLE   Paroxysmal A-fib RVR Now rate controlled Status post TEE/DCCV 11/29, converted to normal sinus rhythm, went back into A-fib RVR 12/2 Continue amiodarone, Cardizem, digoxin, Eliquis Cardiology outpatient follow-up   COPD exacerbation Improved S/p Solu-Medrol--> prednisone, continue nebs prn, Dulera, albuterol as needed Continue supplemental O2 currently requiring about 2 L, was not on oxygen at home Plan to wean off O2   Acute hypoxic, hypercarbic respiratory failure Likely multifactorial CPAP nightly, poor tolerance Recommend outpatient sleep study   Hypotension Continue midodrine   Hyperlipidemia Lipitor   Hypothyroidism Synthroid   Hypokalemia Replace prn  Obesity Lifestyle modification advised      Consultants: Cardiology Procedures performed: None Disposition: Skilled nursing facility Diet recommendation:  Cardiac diet     DISCHARGE MEDICATION: Allergies as of 07/23/2023   No Known Allergies      Medication List     STOP taking these medications    aspirin EC 81 MG tablet   metoprolol succinate 50 MG 24 hr tablet Commonly known as: TOPROL-XL       TAKE these medications    albuterol 108 (90 Base) MCG/ACT inhaler Commonly known as: VENTOLIN HFA Inhale 2 puffs into the lungs 4 (four) times daily as needed for shortness of breath.   amiodarone 400 MG tablet Commonly known as: PACERONE Take 1 tablet (400 mg total) by mouth 2 (two) times daily.   amitriptyline 50 MG tablet Commonly known as: ELAVIL Take 50 mg by mouth at bedtime.  apixaban 5 MG Tabs tablet Commonly known as: ELIQUIS Take 1 tablet (5 mg total) by mouth 2 (two) times daily.   atorvastatin 40 MG tablet Commonly known as: LIPITOR Take 40 mg by mouth at  bedtime.   CALCIUM + D PO Take 2 tablets by mouth 3 (three) times daily. Each tablet Calcium 300 mg, Vitamin D3 250 I.U.   digoxin 0.125 MG tablet Commonly known as: LANOXIN Take 1 tablet (0.125 mg total) by mouth daily. Start taking on: July 24, 2023   diltiazem 120 MG 24 hr capsule Commonly known as: CARDIZEM CD Take 1 capsule (120 mg total) by mouth daily. Start taking on: July 24, 2023   docusate sodium 100 MG capsule Commonly known as: COLACE Take 1 capsule (100 mg total) by mouth 2 (two) times daily.   Farxiga 10 MG Tabs tablet Generic drug: dapagliflozin propanediol TAKE 1 TABLET BY MOUTH DAILY BEFORE BREAKFAST.   ketoconazole 2 % cream Commonly known as: NIZORAL Apply 1 application topically daily. What changed:  when to take this reasons to take this   Klor-Con M20 20 MEQ tablet Generic drug: potassium chloride SA TAKE 1 TABLET BY MOUTH TWICE A DAY   lactulose 10 GM/15ML solution Commonly known as: CHRONULAC Take 30 mLs (20 g total) by mouth 2 (two) times daily as needed for mild constipation.   levalbuterol 0.63 MG/3ML nebulizer solution Commonly known as: XOPENEX Take 3 mLs (0.63 mg total) by nebulization every 6 (six) hours as needed for wheezing or shortness of breath.   levothyroxine 88 MCG tablet Commonly known as: SYNTHROID Take 88 mcg by mouth daily before breakfast.   metFORMIN 500 MG tablet Commonly known as: GLUCOPHAGE Take 500 mg by mouth daily.   midodrine 5 MG tablet Commonly known as: PROAMATINE Take 1 tablet (5 mg total) by mouth 3 (three) times daily with meals.   mometasone-formoterol 200-5 MCG/ACT Aero Commonly known as: DULERA Inhale 2 puffs into the lungs 2 (two) times daily.   nitroGLYCERIN 0.4 MG SL tablet Commonly known as: NITROSTAT Place 1 tablet (0.4 mg total) under the tongue every 5 (five) minutes as needed for chest pain. Reported on 01/28/2016   predniSONE 10 MG tablet Commonly known as: DELTASONE Take 4  tablets (40 mg total) by mouth daily with breakfast for 2 days, THEN 3 tablets (30 mg total) daily with breakfast for 2 days, THEN 2 tablets (20 mg total) daily with breakfast for 2 days, THEN 1 tablet (10 mg total) daily with breakfast for 2 days. Start taking on: July 24, 2023   torsemide 20 MG tablet Commonly known as: DEMADEX Take 2 tablets (40 mg total) by mouth 2 (two) times daily.        Contact information for follow-up providers     Burton Apley, MD. Schedule an appointment as soon as possible for a visit in 1 week(s).   Specialty: Internal Medicine Contact information: [address] [place] [address] [phone]              Contact information for after-discharge care     Destination     [facility] Preferred SNF .   Service: Skilled Nursing Contact information: [address] [place] [address] [phone]                    Discharge Exam: Filed Weights   07/20/23 1347 07/21/23 0431 07/22/23 0528  Weight: 72.3 kg 73.5 kg 77.7 kg  General: NAD, deconditioned Cardiovascular: S1, S2 present Respiratory: Diminished breath sounds bilaterally Abdomen: Soft, nontender, nondistended, bowel sounds present Musculoskeletal: bilateral pedal edema noted Skin: Normal Psychiatry: Normal mood   Condition at discharge: stable  The results of significant diagnostics from this hospitalization (including imaging, microbiology, ancillary and laboratory) are listed below for reference.   Imaging Studies: ECHO TEE  Result Date: 07/16/2023    TRANSESOPHOGEAL ECHO REPORT   Patient Name:   Denise Mckenzie Hubers Date of Exam: 07/16/2023 Medical Rec #:  [id number]    Height:       62.0 in Accession #:    [id number]   Weight:       154.3 lb Date of Birth:  07/09/1946    BSA:          1.712 m Patient Age:    31 years     BP:           135/71 mmHg Patient Gender: F            HR:           125 bpm.  Exam Location:  Inpatient Procedure: Transesophageal Echo, Cardiac Doppler and Color Doppler Indications:     Atrial Fibrillation  History:         Patient has prior history of Echocardiogram examinations, most                  recent 07/13/2023. CHF, CAD and Previous Myocardial Infarction,                  Prior CABG, COPD, Arrythmias:Atrial Flutter and Tachycardia;                  Risk Factors:Hypertension, Diabetes and Dyslipidemia. Breast                  cancer (L) breast.  Sonographer:     Lucendia Herrlich RCS Referring Phys:  [id number] Ronnald Ramp O'NEAL Diagnosing Phys: Lennie Odor MD PROCEDURE: After discussion of the risks and benefits of a TEE, an informed consent was obtained from the patient. TEE procedure time was 8 minutes. The transesophogeal probe was passed without difficulty through the esophogus of the patient. Imaged were  obtained with the patient in a supine position. Sedation performed by different physician. The patient was monitored while under deep sedation. Anesthestetic sedation was provided intravenously by Anesthesiology: 50mg  of Propofol. Image quality was excellent. The patient developed no complications during the procedure. A successful direct current cardioversion was performed at 200 joules with 1 attempt.  IMPRESSIONS  1. Left ventricular ejection fraction, by estimation, is 60 to 65%. The left ventricle has normal function. There is the interventricular septum is flattened in systole and diastole, consistent with right ventricular pressure and volume overload.  2. Right ventricular systolic function is severely reduced. The right ventricular size is severely enlarged.  3. No left atrial/left atrial appendage thrombus was detected. The LAA emptying velocity was 51 cm/s.  4. Right atrial size was severely dilated.  5. The mitral valve is grossly normal. Trivial mitral valve regurgitation. No evidence of mitral stenosis.  6. The aortic valve is tricuspid. There is mild  calcification of the aortic valve. Aortic valve regurgitation is not visualized. No aortic stenosis is present.  7. There is Moderate (Grade III) layered plaque involving the descending aorta. Conclusion(s)/Recommendation(s): No LA/LAA thrombus identified. Successful cardioversion performed with restoration of normal sinus rhythm. FINDINGS  Left Ventricle: Left ventricular ejection fraction, by estimation, is 60  to 65%. The left ventricle has normal function. The left ventricular internal cavity size was small. The interventricular septum is flattened in systole and diastole, consistent with right ventricular pressure and volume overload. Right Ventricle: The right ventricular size is severely enlarged. No increase in right ventricular wall thickness. Right ventricular systolic function is severely reduced. Left Atrium: Left atrial size was normal in size. No left atrial/left atrial appendage thrombus was detected. The LAA emptying velocity was 51 cm/s. Right Atrium: Right atrial size was severely dilated. Pericardium: There is no evidence of pericardial effusion. Mitral Valve: The mitral valve is grossly normal. Trivial mitral valve regurgitation. No evidence of mitral valve stenosis. Tricuspid Valve: The tricuspid valve is grossly normal. Tricuspid valve regurgitation is mild . No evidence of tricuspid stenosis. Aortic Valve: The aortic valve is tricuspid. There is mild calcification of the aortic valve. Aortic valve regurgitation is not visualized. No aortic stenosis is present. Pulmonic Valve: The pulmonic valve was grossly normal. Pulmonic valve regurgitation is not visualized. No evidence of pulmonic stenosis. Aorta: The aortic root and ascending aorta are structurally normal, with no evidence of dilitation. There is moderate (Grade III) layered plaque involving the descending aorta. Venous: The left upper pulmonary vein is normal. IAS/Shunts: The atrial septum is grossly normal. Additional Comments: Spectral  Doppler performed.  AORTA Ao Root diam: 3.71 cm Ao Asc diam:  2.74 cm Lennie Odor MD Electronically signed by Lennie Odor MD Signature Date/Time: 07/16/2023/12:27:53 PM    Final    EP STUDY  Result Date: 07/16/2023 See surgical note for result.  CT ABDOMEN PELVIS WO CONTRAST  Result Date: 07/14/2023 CLINICAL DATA:  History of atrial flutter in syncope, history of breast and lung cancer, clinical concern for retroperitoneal hemorrhage EXAM: CT ABDOMEN AND PELVIS WITHOUT CONTRAST TECHNIQUE: Multidetector CT imaging of the abdomen and pelvis was performed following the standard protocol without IV contrast. Unenhanced CT was performed per clinician order. Lack of IV contrast limits sensitivity and specificity, especially for evaluation of abdominal/pelvic solid viscera. RADIATION DOSE REDUCTION: This exam was performed according to the departmental dose-optimization program which includes automated exposure control, adjustment of the mA and/or kV according to patient size and/or use of iterative reconstruction technique. COMPARISON:  07/12/2023 FINDINGS: Lower chest: Small bilateral pleural effusions, left greater than right, with left lower lobe consolidation most consistent with atelectasis. Hepatobiliary: Cholelithiasis again noted without evidence of cholecystitis. Unremarkable unenhanced appearance of the liver. No biliary duct dilation. Pancreas: Unremarkable unenhanced appearance. Spleen: Unremarkable unenhanced appearance. Adrenals/Urinary Tract: Excreted contrast within the ureters and bladder consistent with previous CT examinations. No filling defects. Kidneys are grossly unremarkable. Stable bilateral adrenal adenomas. Stomach/Bowel: No bowel obstruction or ileus. Interval development of mural thickening involving the cecum and ascending colon to the level of the hepatic flexure, consistent with underlying colitis. Diverticulosis throughout the distal colon without evidence of diverticulitis.  Vascular/Lymphatic: Stable diffuse aortic atherosclerosis. No other significant vascular findings on this limited unenhanced exam. No pathologic adenopathy. Reproductive: Status post hysterectomy. No adnexal masses. Other: Small volume ascites within the abdomen and pelvis not appreciably changed since prior exam, with largest pockets of fluid surrounding the liver and spleen as before. No evidence of retroperitoneal hematoma. No free intraperitoneal gas. No abdominal wall hernia. Musculoskeletal: Extensive subcutaneous edema throughout the abdomen and pelvis. No acute or destructive bony abnormalities. Reconstructed images demonstrate no additional findings. IMPRESSION: 1. New mural thickening extending from the cecum through the hepatic flexure of the colon, consistent with colitis. This could be  inflammatory, infectious, or less likely ischemic. 2. Stable small volume ascites, most pronounced surrounding the liver and spleen. 3. No evidence of retroperitoneal hematoma. 4. Small bilateral pleural effusions with dependent left lower lobe atelectasis. 5. Cholelithiasis without cholecystitis. 6. Diffuse body wall edema unchanged. 7.  Aortic Atherosclerosis (ICD10-I70.0). Electronically Signed   By: Sharlet Salina M.D.   On: 07/14/2023 15:23   CT Angio Chest Pulmonary Embolism (PE) W or WO Contrast  Result Date: 07/13/2023 CLINICAL DATA:  Atrial flutter. Syncope. Volume overload. Clinical concern for pulmonary embolism. History of breast cancer, lung cancer, COPD and CABG. EXAM: CT ANGIOGRAPHY CHEST WITH CONTRAST TECHNIQUE: Multidetector CT imaging of the chest was performed using the standard protocol during bolus administration of intravenous contrast. Multiplanar CT image reconstructions and MIPs were obtained to evaluate the vascular anatomy. RADIATION DOSE REDUCTION: This exam was performed according to the departmental dose-optimization program which includes automated exposure control, adjustment of the mA  and/or kV according to patient size and/or use of iterative reconstruction technique. CONTRAST:  65mL OMNIPAQUE IOHEXOL 350 MG/ML SOLN COMPARISON:  Chest CT dated 07/12/2023 FINDINGS: Cardiovascular: The inferior aspects of the left lower lobe pulmonary arteries are not well opacified with no discrete pulmonary arterial filling defects seen. This has an appearance compatible with slow flow with atelectasis/airspace consolidation in those regions as well as elevation of the left hemidiaphragm and a small left pleural effusion. Enlarged central pulmonary arteries with a main pulmonary artery diameter of 3.4 cm. Atheromatous calcifications, including the coronary arteries and aorta. Post CABG changes. Mediastinum/Nodes: No enlarged mediastinal, hilar, or axillary lymph nodes. Thyroid gland, trachea, and esophagus demonstrate no significant findings. Lungs/Pleura: Stable right upper lobe scarring/postradiation changes. Small left pleural effusion, increased. Interval left lower lobe volume loss and consolidation. Moderate diffuse peribronchial thickening. No new lung nodules. Stable centrilobular emphysematous changes. Upper Abdomen: Small to moderate amount of free peritoneal fluid without significant change. Noncalcified gallstones in the gallbladder measuring up to at least 1.5 cm in maximum diameter each. Stable small amount of pericholecystic fluid with no gallbladder wall thickening. Atheromatous arterial calcifications. Musculoskeletal: Thoracic spine degenerative changes and mild scoliosis. Review of the MIP images confirms the above findings. IMPRESSION: 1. No pulmonary emboli. 2. Interval left lower lobe volume loss and consolidation with a small left pleural effusion, compatible with atelectasis and possible pneumonia. 3. Moderate bronchitic changes. 4. Enlarged central pulmonary arteries, compatible with pulmonary arterial hypertension. 5. Small to moderate amount of free peritoneal fluid in the upper  abdomen, unchanged. 6. Cholelithiasis. 7.  Calcific coronary artery and aortic atherosclerosis. 8. Stable mild centrilobular emphysema. 9. Stable right upper lobe scarring/postradiation changes. Aortic Atherosclerosis (ICD10-I70.0) and Emphysema (ICD10-J43.9). Electronically Signed   By: Beckie Salts M.D.   On: 07/13/2023 15:44   VAS [place] LOWER EXTREMITY VENOUS (DVT)  Result Date: 07/13/2023  Lower Venous DVT Study Patient Name:  HILDEGARDE EMPEY  Date of Exam:   07/13/2023 Medical Rec #: [id number]     Accession #:    [id number] Date of Birth: August 30, 1945     Patient Gender: F Patient Age:   38 years Exam Location:  [facility] Procedure:      VAS [place] LOWER EXTREMITY VENOUS (DVT) Referring Phys: Dolly Rias --------------------------------------------------------------------------------  Indications: Swelling, and Edema.  Risk Factors: Recent extended travel. Limitations: Poor ultrasound/tissue interface. Comparison Study: No prior study Performing Technologist: Shona Simpson  Examination Guidelines: A complete evaluation includes B-mode imaging, spectral Doppler, color Doppler, and power Doppler as needed of all  accessible portions of each vessel. Bilateral testing is considered an integral part of a complete examination. Limited examinations for reoccurring indications may be performed as noted. The reflux portion of the exam is performed with the patient in reverse Trendelenburg.  +---------+---------------+---------+-----------+----------+--------------+ RIGHT    CompressibilityPhasicitySpontaneityPropertiesThrombus Aging +---------+---------------+---------+-----------+----------+--------------+ CFV      Full           No       Yes        Pulsatile                +---------+---------------+---------+-----------+----------+--------------+ SFJ      Full                                                        +---------+---------------+---------+-----------+----------+--------------+  FV Prox  Full                                                        +---------+---------------+---------+-----------+----------+--------------+ FV Mid   Full                                                        +---------+---------------+---------+-----------+----------+--------------+ FV DistalFull                                                        +---------+---------------+---------+-----------+----------+--------------+ PFV      Full                                                        +---------+---------------+---------+-----------+----------+--------------+ POP      Full           No       Yes        Pulsatile                +---------+---------------+---------+-----------+----------+--------------+ PTV      Full                                                        +---------+---------------+---------+-----------+----------+--------------+ PERO     Full                                                        +---------+---------------+---------+-----------+----------+--------------+   +----+---------------+---------+-----------+----------+--------------+ LEFTCompressibilityPhasicitySpontaneityPropertiesThrombus Aging +----+---------------+---------+-----------+----------+--------------+ CFV Full           No  Yes        Pulsatile                +----+---------------+---------+-----------+----------+--------------+     Summary: RIGHT: - There is no evidence of deep vein thrombosis in the lower extremity.  - No cystic structure found in the popliteal fossa.  LEFT: - No evidence of common femoral vein obstruction.   *See table(s) above for measurements and observations. Electronically signed by Lemar Livings MD on 07/13/2023 at 2:30:21 PM.    Final    IR ABDOMEN US LIMITED  Result Date: 07/13/2023 INDICATION: Abdominal distention, fluid overload. EXAM: LIMITED ABDOMEN ULTRASOUND FOR ASCITES TECHNIQUE: Limited ultrasound survey for  ascites was performed in all four abdominal quadrants. COMPARISON:  CT ABDOMEN PELVIS WO CONTRAST FINDINGS: No significant pocket of fluid or percutaneous window to allow safe paracentesis. Risks outweigh the benefits. IMPRESSION: Trace ascites with no safe window for percutaneous access. No procedure performed. Performed by: Loyce Dys PA-C Marliss Coots, MD [facility] Radiology Electronically Signed   By: Marliss Coots M.D.   On: 07/13/2023 11:40   ECHOCARDIOGRAM COMPLETE  Result Date: 07/13/2023    ECHOCARDIOGRAM REPORT   Patient Name:   Denise Mckenzie Villagomez Date of Exam: 07/13/2023 Medical Rec #:  [id number]    Height:       62.0 in Accession #:    [id number]   Weight:       148.4 lb Date of Birth:  April 23, 1946    BSA:          1.684 m Patient Age:    77 years     BP:           91/63 mmHg Patient Gender: F            HR:           122 bpm. Exam Location:  Inpatient Procedure: 2D Echo, Cardiac Doppler and Color Doppler Indications:    Congestive Heart Failure  History:        Patient has prior history of Echocardiogram examinations, most                 recent 08/24/2022. COPD; Risk Factors:Hypertension, Dyslipidemia,                 Diabetes and Former Smoker.  Sonographer:    Karma Ganja Referring Phys: [id number] Dolly Rias  Sonographer Comments: Image acquisition challenging due to uncooperative patient, Image acquisition challenging due to COPD and Image acquisition challenging due to respiratory motion. IMPRESSIONS  1. Left ventricular ejection fraction, by estimation, is 60 to 65%. The left ventricle has normal function. The left ventricle has no regional wall motion abnormalities. Left ventricular diastolic parameters are consistent with Grade I diastolic dysfunction (impaired relaxation). There is the interventricular septum is flattened in systole and diastole, consistent with right ventricular pressure and volume overload.  2. Right ventricular systolic  function is severely reduced. The right ventricular size is moderately enlarged. There is moderately elevated pulmonary artery systolic pressure. The estimated right ventricular systolic pressure is 45.9 mmHg.  3. Right atrial size was severely dilated.  4. The mitral valve is normal in structure. Trivial mitral valve regurgitation.  5. The aortic valve is normal in structure. There is mild calcification of the aortic valve. Aortic valve regurgitation is not visualized.  6. The inferior vena cava is dilated in size with <50% respiratory variability, suggesting right atrial pressure of 15 mmHg. Comparison(s): Changes from prior study are  noted. New severe RV dilation and dysfunction. FINDINGS  Left Ventricle: Left ventricular ejection fraction, by estimation, is 60 to 65%. The left ventricle has normal function. The left ventricle has no regional wall motion abnormalities. The left ventricular internal cavity size was small. There is no left ventricular hypertrophy. The interventricular septum is flattened in systole and diastole, consistent with right ventricular pressure and volume overload. Left ventricular diastolic parameters are consistent with Grade I diastolic dysfunction (impaired relaxation). Right Ventricle: The right ventricular size is moderately enlarged. No increase in right ventricular wall thickness. Right ventricular systolic function is severely reduced. There is moderately elevated pulmonary artery systolic pressure. The tricuspid regurgitant velocity is 2.78 m/s, and with an assumed right atrial pressure of 15 mmHg, the estimated right ventricular systolic pressure is 45.9 mmHg. Left Atrium: Left atrial size was normal in size. Right Atrium: Right atrial size was severely dilated. Pericardium: There is no evidence of pericardial effusion. Mitral Valve: The mitral valve is normal in structure. Trivial mitral valve regurgitation. Tricuspid Valve: The tricuspid valve is normal in structure.  Tricuspid valve regurgitation is mild. Aortic Valve: The aortic valve is normal in structure. There is mild calcification of the aortic valve. Aortic valve regurgitation is not visualized. Aortic valve mean gradient measures 1.0 mmHg. Aortic valve peak gradient measures 2.9 mmHg. Aortic valve  area, by VTI measures 2.41 cm. Pulmonic Valve: The pulmonic valve was grossly normal. Pulmonic valve regurgitation is trivial. Aorta: The aortic root and ascending aorta are structurally normal, with no evidence of dilitation. Venous: The inferior vena cava is dilated in size with less than 50% respiratory variability, suggesting right atrial pressure of 15 mmHg. IAS/Shunts: No atrial level shunt detected by color flow Doppler.  LEFT VENTRICLE PLAX 2D LVIDd:         3.30 cm     Diastology LVIDs:         2.30 cm     LV e' medial:    5.11 cm/s LV PW:         1.20 cm     LV E/e' medial:  16.9 LV IVS:        1.10 cm     LV e' lateral:   13.40 cm/s LVOT diam:     1.90 cm     LV E/e' lateral: 6.5 LV SV:         24 LV SV Index:   14 LVOT Area:     2.84 cm  LV Volumes (MOD) LV vol d, MOD A2C: 15.6 ml LV vol d, MOD A4C: 36.0 ml LV vol s, MOD A2C: 9.8 ml LV vol s, MOD A4C: 15.0 ml LV SV MOD A2C:     5.8 ml LV SV MOD A4C:     36.0 ml LV SV MOD BP:      11.4 ml RIGHT VENTRICLE            IVC RV S prime:     8.16 cm/s  IVC diam: 2.40 cm TAPSE (M-mode): 0.7 cm LEFT ATRIUM             Index        RIGHT ATRIUM           Index LA diam:        4.20 cm 2.49 cm/m   RA Area:     22.10 cm LA Vol (A2C):   36.6 ml 21.74 ml/m  RA Volume:   68.80 ml  40.86 ml/m LA Vol (A4C):  16.3 ml 9.68 ml/m LA Biplane Vol: 23.7 ml 14.07 ml/m  AORTIC VALVE AV Area (Vmax):    2.31 cm AV Area (Vmean):   2.22 cm AV Area (VTI):     2.41 cm AV Vmax:           85.50 cm/s AV Vmean:          54.300 cm/s AV VTI:            0.100 m AV Peak Grad:      2.9 mmHg AV Mean Grad:      1.0 mmHg LVOT Vmax:         69.70 cm/s LVOT Vmean:        42.600 cm/s LVOT VTI:           0.085 m LVOT/AV VTI ratio: 0.85  AORTA Ao Root diam: 3.40 cm MITRAL VALVE               TRICUSPID VALVE MV Area (PHT): 9.37 cm    TR Peak grad:   30.9 mmHg MV Decel Time: 81 msec     TR Vmax:        278.00 cm/s MV E velocity: 86.60 cm/s MV A velocity: 39.90 cm/s  SHUNTS MV E/A ratio:  2.17        Systemic VTI:  0.08 m                            Systemic Diam: 1.90 cm Clearnce Hasten Electronically signed by Clearnce Hasten Signature Date/Time: 07/13/2023/11:19:05 AM    Final    US LIVER DOPPLER  Result Date: 07/13/2023 CLINICAL DATA:  Ascites.  Portal hypertension. EXAM: DUPLEX ULTRASOUND OF LIVER TECHNIQUE: Color and duplex Doppler ultrasound was performed to evaluate the hepatic in-flow and out-flow vessels. COMPARISON:  CT 07/12/2023 FINDINGS: Liver: Liver may have a slightly nodular contour. Normal echogenicity of the liver. No discrete liver lesion. Small amount of perihepatic ascites. Calcified gallstone measuring 1.7 cm with posterior acoustic shadowing. Small amount of fluid around the gallbladder. Mild gallbladder wall thickening. Main Portal Vein size: 1.1 cm Portal Vein Velocities Main Prox:  23 cm/sec Main Mid: 23 cm/sec Main Dist:  25 cm/sec Right: 23 cm/sec Left: 22 cm/sec Hepatic Vein Velocities Right:  18 cm/sec Middle:  30 cm/sec Left:  20 cm/sec IVC: Present and patent with normal respiratory phasicity. Hepatic Artery Velocity: Not visualized Splenic Vein Velocity:  10 cm/sec Spleen: 7.4 cm x 7.7 cm x 3.1 cm with a total volume of 90 cm^3 (411 cm^3 is upper limit normal) Portal Vein Occlusion/Thrombus: No Splenic Vein Occlusion/Thrombus: No Ascites: Present Varices: None Normal hepatopetal flow in the portal veins. Normal hepatofugal flow in the hepatic veins. Small amount of fluid around the liver and spleen. Small amount of fluid in lower quadrants. IMPRESSION: 1. Patent portal veins with normal direction of flow. 2. Small amount of ascites. 3. Cholelithiasis. Mild gallbladder wall  thickening is nonspecific and could be related to the ascites. Recommend clinical correlation with regards to acute cholecystitis. 4. Liver may have a slightly nodular contour. These findings are equivocal. Electronically Signed   By: Richarda Overlie M.D.   On: 07/13/2023 07:47   CT CHEST ABDOMEN PELVIS WO CONTRAST  Result Date: 07/12/2023 CLINICAL DATA:  Poly trauma, penetrating. Shortness of breath. Syncope. History of breast cancer. EXAM: CT CHEST, ABDOMEN AND PELVIS WITHOUT CONTRAST TECHNIQUE: Multidetector CT imaging of the chest, abdomen and pelvis was performed following  the standard protocol without IV contrast. RADIATION DOSE REDUCTION: This exam was performed according to the departmental dose-optimization program which includes automated exposure control, adjustment of the mA and/or kV according to patient size and/or use of iterative reconstruction technique. COMPARISON:  PET-CT 06/10/2021.  Chest CT 12/24/2020. FINDINGS: CT CHEST FINDINGS Cardiovascular: Diffuse atherosclerosis of the aorta, great vessels and coronary arteries status post median sternotomy and CABG. Mild central enlargement of the pulmonary arteries. The heart size is normal. There is no pericardial effusion. Mediastinum/Nodes: There are no enlarged mediastinal, hilar, axillary or internal mammary lymph nodes. Surgical clips are present within the left axilla. Hilar assessment is limited by the lack of intravenous contrast, although the hilar contours appear unchanged. The thyroid gland, trachea and esophagus demonstrate no significant findings. Lungs/Pleura: Trace left pleural effusion with dependent left lower lobe atelectasis. No evidence of pneumothorax. Stable probable radiation changes anteriorly in the right upper lobe adjacent to the fiducial markers. No new or enlarging pulmonary nodules identified. Mild underlying centrilobular emphysema. Musculoskeletal/Chest wall: No chest wall mass or suspicious osseous findings. Stable old  fracture of the right 2nd rib anteriorly. Previous median sternotomy. There are degenerative changes in the spine associated with a convex right thoracic scoliosis. Postsurgical changes in the left breast and left axilla. CT ABDOMEN AND PELVIS FINDINGS Hepatobiliary: No focal hepatic abnormalities are identified on noncontrast imaging. There is a small gallstone. The gallbladder appears mildly distended without significant wall thickening. No significant biliary dilatation. Pancreas: Atrophy. No focal abnormality, ductal dilatation or surrounding inflammation. Spleen: No evidence of acute splenic injury. The spleen is normal in size without focal abnormality. Adrenals/Urinary Tract: Stable low-density adrenal adenomas bilaterally; no specific follow-up imaging recommended. No evidence of urinary tract calculus, suspicious renal lesion or hydronephrosis. The bladder appears normal for its degree of distention. Stomach/Bowel: No enteric contrast administered. The stomach appears unremarkable for its degree of distension. No evidence of bowel wall thickening, distention or surrounding inflammatory change. Vascular/Lymphatic: There are no enlarged abdominal or pelvic lymph nodes. Diffuse aortic and branch vessel atherosclerosis without evidence of aneurysm. Reproductive: Evidence of prior hysterectomy. No suspicious adnexal findings. Other: New moderate amount of low-density ascites without evidence of sentinel clot, dependent high density components or pneumoperitoneum. Intact abdominal wall. There is generalized edema throughout the subcutaneous and intra-abdominal fat. Chronic subcutaneous calcifications in the buttocks bilaterally. Musculoskeletal: No evidence of acute fracture or dislocation. IMPRESSION: 1. No evidence of acute traumatic injury within the chest, abdomen or pelvis. 2. New moderate amount of nonspecific low-density ascites without evidence of sentinel clot, dependent high density components or  pneumoperitoneum. Generalized soft tissue edema. 3. Trace left pleural effusion with dependent left lower lobe atelectasis. 4. Stable probable radiation changes anteriorly in the right upper lobe adjacent to the fiducial markers. No new or enlarging pulmonary nodules identified. 5. Cholelithiasis with mild gallbladder distention but no significant wall thickening or biliary dilatation. 6. Stable bilateral adrenal adenomas. 7. Aortic Atherosclerosis (ICD10-I70.0) and Emphysema (ICD10-J43.9). Electronically Signed   By: Carey Bullocks M.D.   On: 07/12/2023 17:07   CT Head Wo Contrast  Result Date: 07/12/2023 CLINICAL DATA:  Polytrauma, blunt. EXAM: CT HEAD WITHOUT CONTRAST CT CERVICAL SPINE WITHOUT CONTRAST TECHNIQUE: Multidetector CT imaging of the head and cervical spine was performed following the standard protocol without intravenous contrast. Multiplanar CT image reconstructions of the cervical spine were also generated. RADIATION DOSE REDUCTION: This exam was performed according to the departmental dose-optimization program which includes automated exposure control, adjustment of the mA  and/or kV according to patient size and/or use of iterative reconstruction technique. COMPARISON:  Head CT 10/22/2015. FINDINGS: CT HEAD FINDINGS Brain: No acute intracranial hemorrhage. Gray-white differentiation is preserved. No hydrocephalus or extra-axial collection. No mass effect or midline shift. Vascular: No hyperdense vessel or unexpected calcification. Skull: No calvarial fracture or suspicious bone lesion. Skull base is unremarkable. Sinuses/Orbits: No acute finding. Other: None. CT CERVICAL SPINE FINDINGS Alignment: No traumatic malalignment. Skull base and vertebrae: No acute fracture. Normal craniocervical junction. No suspicious bone lesions. Soft tissues and spinal canal: No prevertebral fluid or swelling. No visible canal hematoma. Disc levels: Multilevel cervical spondylosis, worst at C3-4, where there is  at least mild spinal canal stenosis. Upper chest: No acute findings. Other: Atherosclerotic calcifications of the carotid bulbs. IMPRESSION: 1. No acute intracranial abnormality. 2. No acute cervical spine fracture or traumatic malalignment. 3. Multilevel cervical spondylosis, worst at C3-4, where there is at least mild spinal canal stenosis. Electronically Signed   By: Orvan Falconer M.D.   On: 07/12/2023 16:29   CT Cervical Spine Wo Contrast  Result Date: 07/12/2023 CLINICAL DATA:  Polytrauma, blunt. EXAM: CT HEAD WITHOUT CONTRAST CT CERVICAL SPINE WITHOUT CONTRAST TECHNIQUE: Multidetector CT imaging of the head and cervical spine was performed following the standard protocol without intravenous contrast. Multiplanar CT image reconstructions of the cervical spine were also generated. RADIATION DOSE REDUCTION: This exam was performed according to the departmental dose-optimization program which includes automated exposure control, adjustment of the mA and/or kV according to patient size and/or use of iterative reconstruction technique. COMPARISON:  Head CT 10/22/2015. FINDINGS: CT HEAD FINDINGS Brain: No acute intracranial hemorrhage. Gray-white differentiation is preserved. No hydrocephalus or extra-axial collection. No mass effect or midline shift. Vascular: No hyperdense vessel or unexpected calcification. Skull: No calvarial fracture or suspicious bone lesion. Skull base is unremarkable. Sinuses/Orbits: No acute finding. Other: None. CT CERVICAL SPINE FINDINGS Alignment: No traumatic malalignment. Skull base and vertebrae: No acute fracture. Normal craniocervical junction. No suspicious bone lesions. Soft tissues and spinal canal: No prevertebral fluid or swelling. No visible canal hematoma. Disc levels: Multilevel cervical spondylosis, worst at C3-4, where there is at least mild spinal canal stenosis. Upper chest: No acute findings. Other: Atherosclerotic calcifications of the carotid bulbs. IMPRESSION:  1. No acute intracranial abnormality. 2. No acute cervical spine fracture or traumatic malalignment. 3. Multilevel cervical spondylosis, worst at C3-4, where there is at least mild spinal canal stenosis. Electronically Signed   By: Orvan Falconer M.D.   On: 07/12/2023 16:29   DG Chest Port 1 View  Result Date: 07/12/2023 CLINICAL DATA:  Shortness of breath.  Syncope. EXAM: PORTABLE CHEST 1 VIEW COMPARISON:  January 27, 2023. FINDINGS: Stable cardiomediastinal silhouette. Status post coronary artery bypass graft. Minimal left basilar subsegmental atelectasis is noted with small pleural effusion. No definite acute abnormality seen in the right lung. IMPRESSION: Minimal left basilar subsegmental atelectasis with small left pleural effusion. Electronically Signed   By: Lupita Raider M.D.   On: 07/12/2023 15:50    Microbiology: Results for orders placed or performed during the hospital encounter of 07/12/23  Resp panel by RT-PCR (RSV, Flu A&B, Covid) Anterior Nasal Swab     Status: None   Collection Time: 07/12/23  9:52 PM   Specimen: Anterior Nasal Swab  Result Value Ref Range Status   SARS Coronavirus 2 by RT PCR NEGATIVE NEGATIVE Final   Influenza A by PCR NEGATIVE NEGATIVE Final   Influenza B by PCR NEGATIVE NEGATIVE Final  Comment: (NOTE) The Xpert Xpress SARS-CoV-2/FLU/RSV plus assay is intended as an aid in the diagnosis of influenza from Nasopharyngeal swab specimens and should not be used as a sole basis for treatment. Nasal washings and aspirates are unacceptable for Xpert Xpress SARS-CoV-2/FLU/RSV testing.  Fact Sheet for Patients: BloggerCourse.com  Fact Sheet for Healthcare Providers: SeriousBroker.it  This test is not yet approved or cleared by the [place] FDA and has been authorized for detection and/or diagnosis of SARS-CoV-2 by FDA under an Emergency Use Authorization (EUA). This EUA will remain in effect (meaning  this test can be used) for the duration of the COVID-19 declaration under Section 564(b)(1) of the Act, 21 U.S.C. section 360bbb-3(b)(1), unless the authorization is terminated or revoked.     Resp Syncytial Virus by PCR NEGATIVE NEGATIVE Final    Comment: (NOTE) Fact Sheet for Patients: BloggerCourse.com  Fact Sheet for Healthcare Providers: SeriousBroker.it  This test is not yet approved or cleared by the [place] FDA and has been authorized for detection and/or diagnosis of SARS-CoV-2 by FDA under an Emergency Use Authorization (EUA). This EUA will remain in effect (meaning this test can be used) for the duration of the COVID-19 declaration under Section 564(b)(1) of the Act, 21 U.S.C. section 360bbb-3(b)(1), unless the authorization is terminated or revoked.  Performed at [facility], [address], [place], [place] [address]   Culture, blood (Routine X 2) w Reflex to ID Panel     Status: None   Collection Time: 07/13/23 12:43 AM   Specimen: BLOOD RIGHT HAND  Result Value Ref Range Status   Specimen Description BLOOD RIGHT HAND  Final   Special Requests   Final    BOTTLES DRAWN AEROBIC AND ANAEROBIC Blood Culture adequate volume   Culture   Final    NO GROWTH 5 DAYS Performed at [facility], [address], [place], [place] [address]    Report Status 07/18/2023 FINAL  Final  Culture, blood (Routine X 2) w Reflex to ID Panel     Status: None   Collection Time: 07/13/23  3:37 AM   Specimen: BLOOD RIGHT HAND  Result Value Ref Range Status   Specimen Description BLOOD RIGHT HAND  Final   Special Requests   Final    BOTTLES DRAWN AEROBIC AND ANAEROBIC Blood Culture adequate volume   Culture   Final    NO GROWTH 5 DAYS Performed at [facility], [address], [place], [place] [address]    Report Status 07/18/2023 FINAL  Final  MRSA Next Gen by PCR, Nasal     Status: None   Collection Time:  07/14/23 12:30 PM   Specimen: Nasal Mucosa; Nasal Swab  Result Value Ref Range Status   MRSA by PCR Next Gen NOT DETECTED NOT DETECTED Final    Comment: (NOTE) The GeneXpert MRSA Assay (FDA approved for NASAL specimens only), is one component of a comprehensive MRSA colonization surveillance program. It is not intended to diagnose MRSA infection nor to guide or monitor treatment for MRSA infections. Test performance is not FDA approved in patients less than 57 years old. Performed at [facility], [address], [place], [place] [address]     Labs: CBC: Recent Labs  Lab 07/17/23 0238 07/21/23 0315 07/22/23 0249 07/23/23 0310  WBC 11.5* 14.1* 14.3* 14.8*  NEUTROABS  --   --  12.3* 12.7*  HGB 15.4* 15.0 14.5 14.7  HCT 50.2* 48.2* 45.9 45.7  MCV 93.7  91.6 89.3 90.0  PLT 98* 91* 107* 115*   Basic Metabolic Panel: Recent Labs  Lab 07/18/23 1018 07/19/23 0432 07/20/23 0316 07/21/23 0315 07/22/23 0249 07/23/23 0310  NA 138 138 141 137 137 137  K 4.5 4.2 3.4* 3.9 3.6 3.4*  CL 99 98 100 99 95* 94*  CO2 30 32 35* 32 33* 35*  GLUCOSE 127* 106* 139* 157* 122* 110*  BUN 25* 24* 22 22 23 18   CREATININE 0.79 0.80 0.59 0.60 0.70 0.57  CALCIUM 8.6* 8.2* 7.9* 7.7* 7.7* 7.6*  MG 2.4  --   --   --   --   --    Liver Function Tests: No results for input(s): "AST", "ALT", "ALKPHOS", "BILITOT", "PROT", "ALBUMIN" in the last 168 hours. CBG: Recent Labs  Lab 07/22/23 1221 07/22/23 1655 07/22/23 2104 07/23/23 0607 07/23/23 1219  GLUCAP 130* 146* 214* 170* 102*    Discharge time spent: greater than 30 minutes.  Signed: Briant Cedar, MD [facility] 07/23/2023

## 2023-07-23 NOTE — Progress Notes (Signed)
Occupational Therapy Treatment Patient Details Name: Denise Mckenzie MRN: [id number] DOB: [birth year] Today's Date: 07/23/2023   History of present illness Pt is a 77 yo female admitted 11/25 for syncope, decompensated HFpEF. 11/29 TEE DCCV. 12/2 back in AFib. PMH: CAD, CABG in 1996, COPD, HTN, HLD, breast cancer, lung cancer   OT comments  Focused session on standing tolerance and confidence with WB through BLE during ADLs at sink with Stedy. Pt required Max A to stand in [place] and up to Mod A for balance/completion of ADLs within [place] due to quick fatigue. Pt requested return to bed at end of session to work on bed level BLE exercises. Patient will benefit from continued inpatient follow up therapy, <3 hours/day at DC.      If plan is discharge home, recommend the following:  A lot of help with walking and/or transfers;A lot of help with bathing/dressing/bathroom   Equipment Recommendations  None recommended by OT    Recommendations for Other Services      Precautions / Restrictions Precautions Precautions: Fall;Other (comment) Precaution Comments: watch sats Restrictions Weight Bearing Restrictions: No       Mobility Bed Mobility Overal bed mobility: Needs Assistance Bed Mobility: Supine to Sit     Supine to sit: Mod assist, Used rails, HOB elevated     General bed mobility comments: able to bring LE to EOB and use bedrails to assist in lifting trunk. Mod A to scoot hips and handheld assist to fully lift trunk    Transfers Overall transfer level: Needs assistance Equipment used: Ambulation equipment used Transfers: Sit to/from Stand Sit to Stand: Max assist           General transfer comment: Max A to stand in [place] from bed. Able to stand from [facility] pads with CGA.     Balance Overall balance assessment: Needs assistance Sitting-balance support: Feet supported Sitting balance-Leahy Scale: Fair     Standing balance support: Reliant on assistive device for  balance, Bilateral upper extremity supported Standing balance-Leahy Scale: Poor                             ADL either performed or assessed with clinical judgement   ADL Overall ADL's : Needs assistance/impaired     Grooming: Moderate assistance;Standing;Oral care;Wash/dry face Grooming Details (indicate cue type and reason): in [place] at sink, briefly able to stand with mod A for balance correction in [place]- required seated rest break on Stedy pads after 1 min standing attempts with oral care.                               General ADL Comments: Focus on standing tolerance and confidence with WB through BLE with [facility]    Extremity/Trunk Assessment Upper Extremity Assessment Upper Extremity Assessment: Generalized weakness;Right hand dominant   Lower Extremity Assessment Lower Extremity Assessment: Defer to PT evaluation        Vision   Vision Assessment?: No apparent visual deficits   Perception     Praxis      Cognition Arousal: Alert Behavior During Therapy: WFL for tasks assessed/performed Overall Cognitive Status: Impaired/Different from baseline Area of Impairment: Safety/judgement                         Safety/Judgement: Decreased awareness of deficits     General Comments: anxious with mobility at  times, improving initiation and attempting completion of tasks before assist given today        Exercises      Shoulder Instructions       General Comments SpO2 > 91% on 2 L O2 though fatigued after tasks. HR briefly elevated with activity (150s) but sustained low 100s    Pertinent Vitals/ Pain       Pain Assessment Pain Assessment: No/denies pain  Home Living                                          Prior Functioning/Environment              Frequency  Min 1X/week        Progress Toward Goals  OT Goals(current goals can now be found in the care plan section)  Progress towards OT goals:  Progressing toward goals  Acute Rehab OT Goals Patient Stated Goal: increase leg strength OT Goal Formulation: With patient Time For Goal Achievement: 08/02/23 Potential to Achieve Goals: Good ADL Goals Pt Will Perform Lower Body Bathing: with contact guard assist;sit to/from stand;sitting/lateral leans Pt Will Perform Lower Body Dressing: with contact guard assist;sitting/lateral leans;sit to/from stand Pt Will Transfer to Toilet: with contact guard assist;ambulating  Plan      Co-evaluation                 AM-PAC OT "6 Clicks" Daily Activity     Outcome Measure   Help from another person eating meals?: None Help from another person taking care of personal grooming?: A Little Help from another person toileting, which includes using toliet, bedpan, or urinal?: A Lot Help from another person bathing (including washing, rinsing, drying)?: A Lot Help from another person to put on and taking off regular upper body clothing?: A Little Help from another person to put on and taking off regular lower body clothing?: A Lot 6 Click Score: 16    End of Session Equipment Utilized During Treatment: Oxygen  OT Visit Diagnosis: Unsteadiness on feet (R26.81);Other abnormalities of gait and mobility (R26.89);Muscle weakness (generalized) (M62.81)   Activity Tolerance Patient tolerated treatment well   Patient Left in bed;with call bell/phone within reach;with bed alarm set   Nurse Communication Mobility status        Time: 4098-1191 OT Time Calculation (min): 40 min  Charges: OT General Charges $OT Visit: 1 Visit OT Treatments $Self Care/Home Management : 23-37 mins $Therapeutic Activity: 8-22 mins  Bradd Canary, OTR/L Acute Rehab Services Office: [phone]   Lorre Munroe 07/23/2023, 10:56 AM

## 2023-07-25 ENCOUNTER — Other Ambulatory Visit: Payer: Self-pay

## 2023-07-25 ENCOUNTER — Emergency Department (HOSPITAL_COMMUNITY): Payer: Medicare Other

## 2023-07-25 ENCOUNTER — Encounter (HOSPITAL_COMMUNITY): Payer: Self-pay

## 2023-07-25 ENCOUNTER — Emergency Department (HOSPITAL_COMMUNITY)
Admission: EM | Admit: 2023-07-25 | Discharge: 2023-07-26 | Disposition: A | Discharge status: Home / Self Care | Payer: Medicare Other | Attending: Emergency Medicine | Admitting: Emergency Medicine

## 2023-07-25 DIAGNOSIS — I5033 Acute on chronic diastolic (congestive) heart failure: Secondary | ICD-10-CM | POA: Insufficient documentation

## 2023-07-25 DIAGNOSIS — R739 Hyperglycemia, unspecified: Secondary | ICD-10-CM

## 2023-07-25 DIAGNOSIS — R0602 Shortness of breath: Secondary | ICD-10-CM | POA: Diagnosis present

## 2023-07-25 DIAGNOSIS — I2510 Atherosclerotic heart disease of native coronary artery without angina pectoris: Secondary | ICD-10-CM | POA: Insufficient documentation

## 2023-07-25 DIAGNOSIS — R7989 Other specified abnormal findings of blood chemistry: Secondary | ICD-10-CM | POA: Insufficient documentation

## 2023-07-25 DIAGNOSIS — Z7901 Long term (current) use of anticoagulants: Secondary | ICD-10-CM | POA: Diagnosis not present

## 2023-07-25 DIAGNOSIS — Z85118 Personal history of other malignant neoplasm of bronchus and lung: Secondary | ICD-10-CM | POA: Diagnosis not present

## 2023-07-25 DIAGNOSIS — Z79899 Other long term (current) drug therapy: Secondary | ICD-10-CM | POA: Insufficient documentation

## 2023-07-25 DIAGNOSIS — I110 Hypertensive heart disease with heart failure: Secondary | ICD-10-CM | POA: Insufficient documentation

## 2023-07-25 DIAGNOSIS — Z853 Personal history of malignant neoplasm of breast: Secondary | ICD-10-CM | POA: Diagnosis not present

## 2023-07-25 DIAGNOSIS — J449 Chronic obstructive pulmonary disease, unspecified: Secondary | ICD-10-CM | POA: Insufficient documentation

## 2023-07-25 LAB — CBC WITH DIFFERENTIAL/PLATELET
Abs Immature Granulocytes: 0.35 10*3/uL — ABNORMAL HIGH (ref 0.00–0.07)
Basophils Absolute: 0.2 10*3/uL — ABNORMAL HIGH (ref 0.0–0.1)
Basophils Relative: 1 %
Eosinophils Absolute: 0 10*3/uL (ref 0.0–0.5)
Eosinophils Relative: 0 %
HCT: 47 % — ABNORMAL HIGH (ref 36.0–46.0)
Hemoglobin: 15 g/dL (ref 12.0–15.0)
Immature Granulocytes: 2 %
Lymphocytes Relative: 2 %
Lymphs Abs: 0.4 10*3/uL — ABNORMAL LOW (ref 0.7–4.0)
MCH: 29.4 pg (ref 26.0–34.0)
MCHC: 31.9 g/dL (ref 30.0–36.0)
MCV: 92 fL (ref 80.0–100.0)
Monocytes Absolute: 1.6 10*3/uL — ABNORMAL HIGH (ref 0.1–1.0)
Monocytes Relative: 7 %
Neutro Abs: 20.9 10*3/uL — ABNORMAL HIGH (ref 1.7–7.7)
Neutrophils Relative %: 88 %
Platelets: 145 10*3/uL — ABNORMAL LOW (ref 150–400)
RBC: 5.11 MIL/uL (ref 3.87–5.11)
RDW: 16.7 % — ABNORMAL HIGH (ref 11.5–15.5)
WBC: 23.5 10*3/uL — ABNORMAL HIGH (ref 4.0–10.5)
nRBC: 0 % (ref 0.0–0.2)

## 2023-07-25 NOTE — ED Provider Notes (Signed)
[place] EMERGENCY DEPARTMENT AT [facility] Provider Note   CSN: [id number] Arrival date & time: 07/25/23  2233     History {Add pertinent medical, surgical, social history, OB history to HPI:1} Chief Complaint  Patient presents with   Shortness of Breath    Denise Mckenzie is a 77 y.o. female.  The history is provided by the patient and the nursing home.  Shortness of Breath She has a history of hypertension, hyperlipidemia, coronary artery disease, COPD, breast cancer, lung cancer, paroxysmal atrial fibrillation and atrial flutter anticoagulated on apixaban, diastolic heart failure and was sent here from a nursing home because of report of shortness of breath and leg swelling which was not responding to torsemide.  The report from nursing home states that she is worsening since being discharged from the hospital, patient states that she is doing about the same.  She endorses minimal cough, denies fever or chills.  She denies chest pain, heaviness, tightness, pressure.  She denies nausea, vomiting, diaphoresis.  She states that her swelling started about 2 weeks ago and is now basically stable.  She was just discharged from the hospital 2 days ago.    Home Medications Prior to Admission medications   Medication Sig Start Date End Date Taking? Authorizing Provider  albuterol (VENTOLIN HFA) 108 (90 Base) MCG/ACT inhaler Inhale 2 puffs into the lungs 4 (four) times daily as needed for shortness of breath. 05/18/21   [provider]  amiodarone (PACERONE) 400 MG tablet Take 1 tablet (400 mg total) by mouth 2 (two) times daily. 07/23/23   Briant Cedar, MD  amitriptyline (ELAVIL) 50 MG tablet Take 50 mg by mouth at bedtime.      [provider]  apixaban (ELIQUIS) 5 MG TABS tablet Take 1 tablet (5 mg total) by mouth 2 (two) times daily. 07/23/23   Briant Cedar, MD  atorvastatin (LIPITOR) 40 MG tablet Take 40 mg by mouth at bedtime.     [provider]  Calcium Citrate-Vitamin D (CALCIUM + D PO) Take 2 tablets by mouth 3 (three) times daily. Each tablet Calcium 300 mg, Vitamin D3 250 I.U.    [provider]  digoxin (LANOXIN) 0.125 MG tablet Take 1 tablet (0.125 mg total) by mouth daily. 07/24/23   Briant Cedar, MD  diltiazem (CARDIZEM CD) 120 MG 24 hr capsule Take 1 capsule (120 mg total) by mouth daily. 07/24/23   Briant Cedar, MD  docusate sodium (COLACE) 100 MG capsule Take 1 capsule (100 mg total) by mouth 2 (two) times daily. 07/23/23   Briant Cedar, MD  FARXIGA 10 MG TABS tablet TAKE 1 TABLET BY MOUTH DAILY BEFORE BREAKFAST. 10/02/22   Georgeanna Lea, MD  ketoconazole (NIZORAL) 2 % cream Apply 1 application topically daily. Patient taking differently: Apply 1 application  topically daily as needed for irritation. 06/04/20   Magrinat, Valentino Hue, MD  KLOR-CON M20 20 MEQ tablet TAKE 1 TABLET BY MOUTH TWICE A DAY 06/11/23   Georgeanna Lea, MD  lactulose (CHRONULAC) 10 GM/15ML solution Take 30 mLs (20 g total) by mouth 2 (two) times daily as needed for mild constipation. 07/23/23   Briant Cedar, MD  levalbuterol Pauline Aus) 0.63 MG/3ML nebulizer solution Take 3 mLs (0.63 mg total) by nebulization every 6 (six) hours as needed for wheezing or shortness of breath. 07/23/23   Briant Cedar, MD  levothyroxine (SYNTHROID, LEVOTHROID) 88 MCG tablet Take 88 mcg by mouth daily  before breakfast.     [provider]  metFORMIN (GLUCOPHAGE) 500 MG tablet Take 500 mg by mouth daily. 09/17/15   [provider]  midodrine (PROAMATINE) 5 MG tablet Take 1 tablet (5 mg total) by mouth 3 (three) times daily with meals. 07/23/23   Briant Cedar, MD  mometasone-formoterol (DULERA) 200-5 MCG/ACT AERO Inhale 2 puffs into the lungs 2 (two) times daily. 07/23/23   Briant Cedar, MD  nitroGLYCERIN (NITROSTAT) 0.4 MG SL tablet Place 1 tablet (0.4 mg total) under the tongue every 5  (five) minutes as needed for chest pain. Reported on 01/28/2016 05/29/21   Baldo Daub, MD  predniSONE (DELTASONE) 10 MG tablet Take 4 tablets (40 mg total) by mouth daily with breakfast for 2 days, THEN 3 tablets (30 mg total) daily with breakfast for 2 days, THEN 2 tablets (20 mg total) daily with breakfast for 2 days, THEN 1 tablet (10 mg total) daily with breakfast for 2 days. 07/24/23 08/01/23  Briant Cedar, MD  torsemide (DEMADEX) 20 MG tablet Take 2 tablets (40 mg total) by mouth 2 (two) times daily. 07/02/23   Sharlene Dory, PA-C      Allergies    Patient has no known allergies.    Review of Systems   Review of Systems  Respiratory:  Positive for shortness of breath.   All other systems reviewed and are negative.   Physical Exam Updated Vital Signs BP (!) 113/51 (BP Location: Right Arm)   Pulse 92   Temp 98.4 F (36.9 C) (Oral)   Resp 18   Ht 5\' 2"  (1.575 m)   Wt 69.4 kg   SpO2 95%   BMI 27.98 kg/m  Physical Exam Vitals and nursing note reviewed.   77 year old female, resting comfortably and in no acute distress. Vital signs are normal. Oxygen saturation is 95%, which is normal. Head is normocephalic and atraumatic. PERRLA, EOMI. Neck is nontender and supple without adenopathy or JVD. Back is nontender and there is no CVA tenderness.  There is 2+ presacral edema. Lungs are clear without rales or rhonchi.  There are scattered wheezes. Chest is nontender. Heart has regular rate and rhythm without murmur. Abdomen is soft, flat, nontender. Extremities have 2+ edema, full range of motion is present. Skin is warm and dry without rash. Neurologic: Mental status is normal, moves all extremities equally.  ED Results / Procedures / Treatments   Labs (all labs ordered are listed, but only abnormal results are displayed) Labs Reviewed - No data to display  EKG None  Radiology No results found.  Procedures Procedures  Cardiac monitor shows atrial  fibrillation, per my interpretation.  Medications Ordered in ED Medications - No data to display  ED Course/ Medical Decision Making/ A&P   {   Click here for ABCD2, HEART and other calculatorsREFRESH Note before signing :1}                              Medical Decision Making Amount and/or Complexity of Data Reviewed Labs: ordered. Radiology: ordered.   History of heart failure with clear evidence of right-sided heart failure and peripheral edema.  Patient is not in any distress at rest.  I have reviewed her past records and do note hospitalization from 07/12/2023-07/23/2023 with anasarca.  I have ordered workup of ECG, chest x-ray, CBC, basic metabolic panel, troponin, BNP.  {Document critical care time when appropriate:1} {Document  review of labs and clinical decision tools ie heart score, Chads2Vasc2 etc:1}  {Document your independent review of radiology images, and any outside records:1} {Document your discussion with family members, caretakers, and with consultants:1} {Document social determinants of health affecting pt's care:1} {Document your decision making why or why not admission, treatments were needed:1} Final Clinical Impression(s) / ED Diagnoses Final diagnoses:  None    Rx / DC Orders ED Discharge Orders     None

## 2023-07-25 NOTE — ED Triage Notes (Signed)
Pt BIB [facility] from [facility]. Complaining of shortness of breath and leg swelling that has been getting worse since she was discharged from the hospital. Has been on torsemide which is not helping the swelling.

## 2023-07-26 LAB — BASIC METABOLIC PANEL
Anion gap: 11 (ref 5–15)
BUN: 34 mg/dL — ABNORMAL HIGH (ref 8–23)
CO2: 33 mmol/L — ABNORMAL HIGH (ref 22–32)
Calcium: 8.5 mg/dL — ABNORMAL LOW (ref 8.9–10.3)
Chloride: 94 mmol/L — ABNORMAL LOW (ref 98–111)
Creatinine, Ser: 0.89 mg/dL (ref 0.44–1.00)
GFR, Estimated: >60 mL/min (ref >60)
Glucose, Bld: 147 mg/dL — ABNORMAL HIGH (ref 70–99)
Potassium: 4.6 mmol/L (ref 3.5–5.1)
Sodium: 138 mmol/L (ref 135–145)

## 2023-07-26 LAB — BRAIN NATRIURETIC PEPTIDE: B Natriuretic Peptide: 1292.5 pg/mL — ABNORMAL HIGH (ref 0.0–100.0)

## 2023-07-26 LAB — TROPONIN I (HIGH SENSITIVITY)
Troponin I (High Sensitivity): 44 ng/L — ABNORMAL HIGH (ref <18)
Troponin I (High Sensitivity): 47 ng/L — ABNORMAL HIGH (ref <18)

## 2023-07-26 MED ORDER — FUROSEMIDE 10 MG/ML IJ SOLN
80.0000 mg | Freq: Once | INTRAMUSCULAR | Status: AC
  Administered 2023-07-26: 80 mg via INTRAVENOUS
  Filled 2023-07-26: qty 8

## 2023-07-26 NOTE — Discharge Instructions (Addendum)
Increase your torsemide to 3 tablets (60 mg) daily for the next 5 days.  Then return to taking 2 tablets (40 mg) daily.  Please work with your cardiologist to establish what is felt to be your dry weight.  You should weigh yourself daily and increase your torsemide dose when your weight increases.  Return if you have any new or concerning symptoms.

## 2023-07-30 ENCOUNTER — Emergency Department (HOSPITAL_COMMUNITY)
Admission: EM | Admit: 2023-07-30 | Discharge: 2023-07-31 | Disposition: A | Discharge status: Home / Self Care | Payer: Medicare Other | Source: Home / Self Care | Attending: Emergency Medicine | Admitting: Emergency Medicine

## 2023-07-30 ENCOUNTER — Other Ambulatory Visit: Payer: Self-pay

## 2023-07-30 ENCOUNTER — Emergency Department (HOSPITAL_COMMUNITY): Payer: Medicare Other

## 2023-07-30 ENCOUNTER — Encounter (HOSPITAL_COMMUNITY): Payer: Self-pay

## 2023-07-30 DIAGNOSIS — Z853 Personal history of malignant neoplasm of breast: Secondary | ICD-10-CM | POA: Insufficient documentation

## 2023-07-30 DIAGNOSIS — R0602 Shortness of breath: Secondary | ICD-10-CM | POA: Diagnosis not present

## 2023-07-30 DIAGNOSIS — Z951 Presence of aortocoronary bypass graft: Secondary | ICD-10-CM | POA: Insufficient documentation

## 2023-07-30 DIAGNOSIS — I10 Essential (primary) hypertension: Secondary | ICD-10-CM | POA: Insufficient documentation

## 2023-07-30 DIAGNOSIS — I2510 Atherosclerotic heart disease of native coronary artery without angina pectoris: Secondary | ICD-10-CM | POA: Insufficient documentation

## 2023-07-30 DIAGNOSIS — Z7901 Long term (current) use of anticoagulants: Secondary | ICD-10-CM | POA: Insufficient documentation

## 2023-07-30 DIAGNOSIS — J441 Chronic obstructive pulmonary disease with (acute) exacerbation: Secondary | ICD-10-CM | POA: Insufficient documentation

## 2023-07-30 DIAGNOSIS — A419 Sepsis, unspecified organism: Secondary | ICD-10-CM | POA: Diagnosis not present

## 2023-07-30 DIAGNOSIS — Z79899 Other long term (current) drug therapy: Secondary | ICD-10-CM | POA: Insufficient documentation

## 2023-07-30 DIAGNOSIS — Z1152 Encounter for screening for COVID-19: Secondary | ICD-10-CM | POA: Insufficient documentation

## 2023-07-30 DIAGNOSIS — D72829 Elevated white blood cell count, unspecified: Secondary | ICD-10-CM | POA: Insufficient documentation

## 2023-07-30 DIAGNOSIS — Z85118 Personal history of other malignant neoplasm of bronchus and lung: Secondary | ICD-10-CM | POA: Insufficient documentation

## 2023-07-30 LAB — RESP PANEL BY RT-PCR (RSV, FLU A&B, COVID)  RVPGX2
Influenza A by PCR: NEGATIVE
Influenza B by PCR: NEGATIVE
Resp Syncytial Virus by PCR: NEGATIVE
SARS Coronavirus 2 by RT PCR: NEGATIVE

## 2023-07-30 LAB — BASIC METABOLIC PANEL WITH GFR
Anion gap: 9 (ref 5–15)
BUN: 40 mg/dL — ABNORMAL HIGH (ref 8–23)
CO2: 38 mmol/L — ABNORMAL HIGH (ref 22–32)
Calcium: 8.4 mg/dL — ABNORMAL LOW (ref 8.9–10.3)
Chloride: 92 mmol/L — ABNORMAL LOW (ref 98–111)
Creatinine, Ser: 0.99 mg/dL (ref 0.44–1.00)
GFR, Estimated: 58 mL/min — ABNORMAL LOW (ref >60)
Glucose, Bld: 156 mg/dL — ABNORMAL HIGH (ref 70–99)
Potassium: 4.2 mmol/L (ref 3.5–5.1)
Sodium: 139 mmol/L (ref 135–145)

## 2023-07-30 LAB — CBC
HCT: 46.6 % — ABNORMAL HIGH (ref 36.0–46.0)
Hemoglobin: 14.6 g/dL (ref 12.0–15.0)
MCH: 29 pg (ref 26.0–34.0)
MCHC: 31.3 g/dL (ref 30.0–36.0)
MCV: 92.6 fL (ref 80.0–100.0)
Platelets: 174 10*3/uL (ref 150–400)
RBC: 5.03 MIL/uL (ref 3.87–5.11)
RDW: 16.3 % — ABNORMAL HIGH (ref 11.5–15.5)
WBC: 20.9 10*3/uL — ABNORMAL HIGH (ref 4.0–10.5)
nRBC: 0 % (ref 0.0–0.2)

## 2023-07-30 LAB — I-STAT CG4 LACTIC ACID, ED: Lactic Acid, Venous: 1.4 mmol/L (ref 0.5–1.9)

## 2023-07-30 LAB — BRAIN NATRIURETIC PEPTIDE: B Natriuretic Peptide: 1287.7 pg/mL — ABNORMAL HIGH (ref 0.0–100.0)

## 2023-07-30 MED ORDER — METHYLPREDNISOLONE SODIUM SUCC 125 MG IJ SOLR
125.0000 mg | Freq: Once | INTRAMUSCULAR | Status: AC
  Administered 2023-07-30: 125 mg via INTRAVENOUS
  Filled 2023-07-30: qty 2

## 2023-07-30 MED ORDER — IPRATROPIUM-ALBUTEROL 0.5-2.5 (3) MG/3ML IN SOLN
3.0000 mL | RESPIRATORY_TRACT | Status: AC
  Administered 2023-07-30 (×2): 3 mL via RESPIRATORY_TRACT
  Filled 2023-07-30 (×3): qty 3

## 2023-07-30 NOTE — ED Triage Notes (Signed)
Pt arrived from [facility], BIB [facility] for SOB, pt arrived on simple mask at 8L, pt baseline is 3L [place]. SOB started 1 hr PTA, EMS reports wheezing bilateral lungs, 88% on 3L [place] on EMS arrival. Pt recently d/c from admission for SOB. A&O x4, pt alert on arrival, answering questions appropriately. Was neg for resp panel. Hx of CHF, COPD, & bronchitis. Sats 91% on 3L [place] at current.   1 NEB treatment in route, w/improvement administer by EMS

## 2023-07-30 NOTE — ED Provider Notes (Incomplete)
[place] EMERGENCY DEPARTMENT AT [facility] Provider Note   CSN: [id number] Arrival date & time: 07/30/23  2118     History {Add pertinent medical, surgical, social history, OB history to HPI:1} Chief Complaint  Patient presents with  . Shortness of Breath    Denise Mckenzie is a 77 y.o. female.  77 year old female with past medical history of CAD, CABG in 1996, COPD, hypertension, hyperlipidemia, breast cancer, lung cancer presents here for shortness of breath.  Patient was recently admitted for CHF exacerbation and COPD exacerbation.  She was discharged on 12/6.  Patient states that her shortness of breath began 1 day after discharge and has been progressively worsening.  She is having an associated cough.  It is intermittently productive of sputum.  She states cough is atypical for her.  She denies fevers or chills.  No associated chest pain or palpitations.  No nausea, vomiting. Patient is on her baseline 3 L supplemental oxygen here.   The history is provided by the patient.       Home Medications Prior to Admission medications   Medication Sig Start Date End Date Taking? Authorizing Provider  albuterol (VENTOLIN HFA) 108 (90 Base) MCG/ACT inhaler Inhale 2 puffs into the lungs 4 (four) times daily as needed for shortness of breath. 05/18/21   [provider]  amiodarone (PACERONE) 400 MG tablet Take 1 tablet (400 mg total) by mouth 2 (two) times daily. 07/23/23   Briant Cedar, MD  amitriptyline (ELAVIL) 50 MG tablet Take 50 mg by mouth at bedtime.      [provider]  apixaban (ELIQUIS) 5 MG TABS tablet Take 1 tablet (5 mg total) by mouth 2 (two) times daily. 07/23/23   Briant Cedar, MD  atorvastatin (LIPITOR) 40 MG tablet Take 40 mg by mouth at bedtime.     [provider]  Calcium Citrate-Vitamin D (CALCIUM + D PO) Take 2 tablets by mouth 3 (three) times daily. Each tablet Calcium 300 mg, Vitamin D3 250 I.U.    [provider]  digoxin (LANOXIN) 0.125 MG tablet Take 1 tablet (0.125 mg total) by mouth daily. 07/24/23   Briant Cedar, MD  diltiazem (CARDIZEM CD) 120 MG 24 hr capsule Take 1 capsule (120 mg total) by mouth daily. 07/24/23   Briant Cedar, MD  docusate sodium (COLACE) 100 MG capsule Take 1 capsule (100 mg total) by mouth 2 (two) times daily. 07/23/23   Briant Cedar, MD  FARXIGA 10 MG TABS tablet TAKE 1 TABLET BY MOUTH DAILY BEFORE BREAKFAST. 10/02/22   Georgeanna Lea, MD  ketoconazole (NIZORAL) 2 % cream Apply 1 application topically daily. Patient taking differently: Apply 1 application  topically daily as needed for irritation. 06/04/20   Magrinat, Valentino Hue, MD  KLOR-CON M20 20 MEQ tablet TAKE 1 TABLET BY MOUTH TWICE A DAY 06/11/23   Georgeanna Lea, MD  lactulose (CHRONULAC) 10 GM/15ML solution Take 30 mLs (20 g total) by mouth 2 (two) times daily as needed for mild constipation. 07/23/23   Briant Cedar, MD  levalbuterol Pauline Aus) 0.63 MG/3ML nebulizer solution Take 3 mLs (0.63 mg total) by nebulization every 6 (six) hours as needed for wheezing or shortness of breath. 07/23/23   Briant Cedar, MD  levothyroxine (SYNTHROID, LEVOTHROID) 88 MCG tablet Take 88 mcg by mouth daily before breakfast.     [provider]  metFORMIN (GLUCOPHAGE) 500 MG tablet Take 500 mg by mouth daily. 09/17/15  [provider]  midodrine (PROAMATINE) 5 MG tablet Take 1 tablet (5 mg total) by mouth 3 (three) times daily with meals. 07/23/23   Briant Cedar, MD  mometasone-formoterol (DULERA) 200-5 MCG/ACT AERO Inhale 2 puffs into the lungs 2 (two) times daily. 07/23/23   Briant Cedar, MD  nitroGLYCERIN (NITROSTAT) 0.4 MG SL tablet Place 1 tablet (0.4 mg total) under the tongue every 5 (five) minutes as needed for chest pain. Reported on 01/28/2016 05/29/21   Baldo Daub, MD  predniSONE (DELTASONE) 10 MG tablet Take 4 tablets (40 mg total) by  mouth daily with breakfast for 2 days, THEN 3 tablets (30 mg total) daily with breakfast for 2 days, THEN 2 tablets (20 mg total) daily with breakfast for 2 days, THEN 1 tablet (10 mg total) daily with breakfast for 2 days. 07/24/23 08/01/23  Briant Cedar, MD  torsemide (DEMADEX) 20 MG tablet Take 2 tablets (40 mg total) by mouth 2 (two) times daily. 07/02/23   Sharlene Dory, PA-C      Allergies    Patient has no known allergies.    Review of Systems   As noted in HPI  Physical Exam Updated Vital Signs SpO2 98%  Physical Exam Vitals reviewed.  Constitutional:      General: She is not in acute distress.    Appearance: Normal appearance. She is normal weight. She is not ill-appearing, toxic-appearing or diaphoretic.  HENT:     Nose: Nose normal.     Mouth/Throat:     Mouth: Mucous membranes are moist.  Cardiovascular:     Rate and Rhythm: Normal rate and regular rhythm.     Heart sounds: Normal heart sounds. No murmur heard.    No friction rub. No gallop.  Pulmonary:     Effort: Pulmonary effort is normal. No respiratory distress.     Breath sounds: Wheezing (Return expiratory wheezes throughout all fields) present. No rhonchi or rales.  Abdominal:     General: There is no distension.     Palpations: Abdomen is soft.     Tenderness: There is no abdominal tenderness. There is no guarding or rebound.  Skin:    General: Skin is warm and dry.  Neurological:     Mental Status: She is alert.     ED Results / Procedures / Treatments   Labs (all labs ordered are listed, but only abnormal results are displayed) Labs Reviewed - No data to display  EKG None  Radiology No results found.  Procedures Procedures  {Document cardiac monitor, telemetry assessment procedure when appropriate:1}  Medications Ordered in ED Medications - No data to display  ED Course/ Medical Decision Making/ A&P Clinical Course as of 07/30/23 2320  Fri Jul 30, 2023  2210 Stable AoC SOB  COPD flare.  Back to baseline  Treated. Target O2 sats is 88% Getting treated and likely dc [CC]  2218 DG Chest Port 1 View Independently interpreted this patient's chest x-ray.  Appears stable from prior.  No new focal opacities noted.  Evidence of pneumothorax. [JR]  2237 B Natriuretic Peptide(!): 1,287.7 At baseline for patient. [JR]    Clinical Course User Index [CC] Glyn Ade, MD [JR] Rolla Flatten, MD   {   Click here for ABCD2, HEART and other calculatorsREFRESH Note before signing :1}                              Medical  Decision Making Amount and/or Complexity of Data Reviewed Labs: ordered. Decision-making details documented in ED Course. Radiology: ordered. Decision-making details documented in ED Course.  Risk Prescription drug management.   77 year old female sent here from nursing facility for concerns of shortness of breath.  Arrives here on her baseline supplemental oxygen requirement.  She has inspiratory and expiratory wheezing throughout all lung fields.  However, is not tachypneic.  No increased work of breathing or retractions noted.  She speaking in full sentences.  Medical chart reviewed.  Patient was discharged on 12/6 after recent exacerbation.  Was discharged on prednisone taper, which she has not yet completed.  Patient's presentation is most consistent with exacerbation of chronic illness.   {Document critical care time when appropriate:1} {Document review of labs and clinical decision tools ie heart score, Chads2Vasc2 etc:1}  {Document your independent review of radiology images, and any outside records:1} {Document your discussion with family members, caretakers, and with consultants:1} {Document social determinants of health affecting pt's care:1} {Document your decision making why or why not admission, treatments were needed:1} Final Clinical Impression(s) / ED Diagnoses Final diagnoses:  None    Rx / DC Orders ED Discharge Orders      None

## 2023-07-30 NOTE — Discharge Instructions (Signed)
Please return to the emergency department if your oxygen saturation is below 88% on your 3 L nasal cannula or if you develop chest pain, heart racing.    Continue the prednisone taper that you were prescribed at your hospital discharge.  You should use albuterol nebulizers as needed for shortness of breath.  You should take your albuterol inhaler every 4 hours for the next 2 days to help with your shortness of breath.  You can then use it as needed.

## 2023-07-30 NOTE — ED Provider Notes (Signed)
[place] EMERGENCY DEPARTMENT AT [facility] Provider Note   CSN: [id number] Arrival date & time: 07/30/23  2118     History {Add pertinent medical, surgical, social history, OB history to HPI:1} Chief Complaint  Patient presents with   Shortness of Breath    MELONI THUNSTROM is a 77 y.o. female.  77 year old female with past medical history of CAD, CABG in 1996, COPD, hypertension, hyperlipidemia, breast cancer, lung cancer presents here for shortness of breath.  Patient was recently admitted for CHF exacerbation and COPD exacerbation.  She was discharged on 12/6.  Patient states that her shortness of breath began 1 day after discharge and has been progressively worsening.  She is having an associated cough.  It is intermittently productive of sputum.  She states cough is atypical for her.  She denies fevers or chills.  No associated chest pain or palpitations.  No nausea, vomiting. Patient is on her baseline 3 L supplemental oxygen here.   The history is provided by the patient.       Home Medications Prior to Admission medications   Medication Sig Start Date End Date Taking? Authorizing Provider  albuterol (VENTOLIN HFA) 108 (90 Base) MCG/ACT inhaler Inhale 2 puffs into the lungs 4 (four) times daily as needed for shortness of breath. 05/18/21   [provider]  amiodarone (PACERONE) 400 MG tablet Take 1 tablet (400 mg total) by mouth 2 (two) times daily. 07/23/23   Briant Cedar, MD  amitriptyline (ELAVIL) 50 MG tablet Take 50 mg by mouth at bedtime.      [provider]  apixaban (ELIQUIS) 5 MG TABS tablet Take 1 tablet (5 mg total) by mouth 2 (two) times daily. 07/23/23   Briant Cedar, MD  atorvastatin (LIPITOR) 40 MG tablet Take 40 mg by mouth at bedtime.     [provider]  Calcium Citrate-Vitamin D (CALCIUM + D PO) Take 2 tablets by mouth 3 (three) times daily. Each tablet Calcium 300 mg, Vitamin D3 250 I.U.    [provider]  digoxin (LANOXIN) 0.125 MG tablet Take 1 tablet (0.125 mg total) by mouth daily. 07/24/23   Briant Cedar, MD  diltiazem (CARDIZEM CD) 120 MG 24 hr capsule Take 1 capsule (120 mg total) by mouth daily. 07/24/23   Briant Cedar, MD  docusate sodium (COLACE) 100 MG capsule Take 1 capsule (100 mg total) by mouth 2 (two) times daily. 07/23/23   Briant Cedar, MD  FARXIGA 10 MG TABS tablet TAKE 1 TABLET BY MOUTH DAILY BEFORE BREAKFAST. 10/02/22   Georgeanna Lea, MD  ketoconazole (NIZORAL) 2 % cream Apply 1 application topically daily. Patient taking differently: Apply 1 application  topically daily as needed for irritation. 06/04/20   Magrinat, Valentino Hue, MD  KLOR-CON M20 20 MEQ tablet TAKE 1 TABLET BY MOUTH TWICE A DAY 06/11/23   Georgeanna Lea, MD  lactulose (CHRONULAC) 10 GM/15ML solution Take 30 mLs (20 g total) by mouth 2 (two) times daily as needed for mild constipation. 07/23/23   Briant Cedar, MD  levalbuterol Pauline Aus) 0.63 MG/3ML nebulizer solution Take 3 mLs (0.63 mg total) by nebulization every 6 (six) hours as needed for wheezing or shortness of breath. 07/23/23   Briant Cedar, MD  levothyroxine (SYNTHROID, LEVOTHROID) 88 MCG tablet Take 88 mcg by mouth daily before breakfast.     [provider]  metFORMIN (GLUCOPHAGE) 500 MG tablet Take 500 mg by mouth daily. 09/17/15  [provider]  midodrine (PROAMATINE) 5 MG tablet Take 1 tablet (5 mg total) by mouth 3 (three) times daily with meals. 07/23/23   Briant Cedar, MD  mometasone-formoterol (DULERA) 200-5 MCG/ACT AERO Inhale 2 puffs into the lungs 2 (two) times daily. 07/23/23   Briant Cedar, MD  nitroGLYCERIN (NITROSTAT) 0.4 MG SL tablet Place 1 tablet (0.4 mg total) under the tongue every 5 (five) minutes as needed for chest pain. Reported on 01/28/2016 05/29/21   Baldo Daub, MD  predniSONE (DELTASONE) 10 MG tablet Take 4 tablets (40 mg total) by  mouth daily with breakfast for 2 days, THEN 3 tablets (30 mg total) daily with breakfast for 2 days, THEN 2 tablets (20 mg total) daily with breakfast for 2 days, THEN 1 tablet (10 mg total) daily with breakfast for 2 days. 07/24/23 08/01/23  Briant Cedar, MD  torsemide (DEMADEX) 20 MG tablet Take 2 tablets (40 mg total) by mouth 2 (two) times daily. 07/02/23   Sharlene Dory, PA-C      Allergies    Patient has no known allergies.    Review of Systems   As noted in HPI  Physical Exam Updated Vital Signs SpO2 98%  Physical Exam Vitals reviewed.  Constitutional:      General: She is not in acute distress.    Appearance: Normal appearance. She is normal weight. She is not ill-appearing, toxic-appearing or diaphoretic.  HENT:     Nose: Nose normal.     Mouth/Throat:     Mouth: Mucous membranes are moist.  Cardiovascular:     Rate and Rhythm: Normal rate and regular rhythm.     Heart sounds: Normal heart sounds. No murmur heard.    No friction rub. No gallop.  Pulmonary:     Effort: Pulmonary effort is normal. No respiratory distress.     Breath sounds: Wheezing (Return expiratory wheezes throughout all fields) present. No rhonchi or rales.  Abdominal:     General: There is no distension.     Palpations: Abdomen is soft.     Tenderness: There is no abdominal tenderness. There is no guarding or rebound.  Skin:    General: Skin is warm and dry.  Neurological:     Mental Status: She is alert.     ED Results / Procedures / Treatments   Labs (all labs ordered are listed, but only abnormal results are displayed) Labs Reviewed - No data to display  EKG None  Radiology No results found.  Procedures Procedures  {Document cardiac monitor, telemetry assessment procedure when appropriate:1}  Medications Ordered in ED Medications - No data to display  ED Course/ Medical Decision Making/ A&P Clinical Course as of 07/30/23 2220  Fri Jul 30, 2023  2210 Stable AoC SOB  COPD flare.  Back to baseline  Treated. Target O2 sats is 88% Getting treated and likely dc [CC]  2218 DG Chest Port 1 View Independently interpreted this patient's chest x-ray.  Appears stable from prior.  No new focal opacities noted.  Evidence of pneumothorax. [JR]    Clinical Course User Index [CC] Glyn Ade, MD [JR] Rolla Flatten, MD   {   Click here for ABCD2, HEART and other calculatorsREFRESH Note before signing :1}                              Medical Decision Making Amount and/or Complexity of Data Reviewed Labs: ordered. Radiology:  ordered. Decision-making details documented in ED Course.  Risk Prescription drug management.     Patient's presentation is most consistent with exacerbation of chronic illness.   {Document critical care time when appropriate:1} {Document review of labs and clinical decision tools ie heart score, Chads2Vasc2 etc:1}  {Document your independent review of radiology images, and any outside records:1} {Document your discussion with family members, caretakers, and with consultants:1} {Document social determinants of health affecting pt's care:1} {Document your decision making why or why not admission, treatments were needed:1} Final Clinical Impression(s) / ED Diagnoses Final diagnoses:  None    Rx / DC Orders ED Discharge Orders     None

## 2023-07-30 NOTE — ED Notes (Signed)
Attempted to call [facility], no answer at this time, phone ranged, no VM to leave a message, will attempt to call back shortly to advised of pt's return to facility.

## 2023-08-01 ENCOUNTER — Other Ambulatory Visit: Payer: Self-pay

## 2023-08-01 ENCOUNTER — Emergency Department (HOSPITAL_COMMUNITY): Payer: Medicare Other

## 2023-08-01 ENCOUNTER — Encounter (HOSPITAL_COMMUNITY): Payer: Self-pay

## 2023-08-01 ENCOUNTER — Inpatient Hospital Stay (HOSPITAL_COMMUNITY)
Admission: EM | Admit: 2023-08-01 | Discharge: 2023-08-18 | DRG: 871 | Disposition: E | Payer: Medicare Other | Source: Skilled Nursing Facility | Attending: Family Medicine | Admitting: Family Medicine

## 2023-08-01 DIAGNOSIS — Z515 Encounter for palliative care: Secondary | ICD-10-CM

## 2023-08-01 DIAGNOSIS — Z9089 Acquired absence of other organs: Secondary | ICD-10-CM

## 2023-08-01 DIAGNOSIS — J440 Chronic obstructive pulmonary disease with acute lower respiratory infection: Secondary | ICD-10-CM | POA: Diagnosis present

## 2023-08-01 DIAGNOSIS — Z66 Do not resuscitate: Secondary | ICD-10-CM | POA: Diagnosis not present

## 2023-08-01 DIAGNOSIS — S71001A Unspecified open wound, right hip, initial encounter: Secondary | ICD-10-CM | POA: Diagnosis present

## 2023-08-01 DIAGNOSIS — A419 Sepsis, unspecified organism: Secondary | ICD-10-CM | POA: Diagnosis present

## 2023-08-01 DIAGNOSIS — Z951 Presence of aortocoronary bypass graft: Secondary | ICD-10-CM

## 2023-08-01 DIAGNOSIS — E869 Volume depletion, unspecified: Secondary | ICD-10-CM | POA: Diagnosis present

## 2023-08-01 DIAGNOSIS — M810 Age-related osteoporosis without current pathological fracture: Secondary | ICD-10-CM | POA: Diagnosis present

## 2023-08-01 DIAGNOSIS — I50810 Right heart failure, unspecified: Secondary | ICD-10-CM | POA: Diagnosis not present

## 2023-08-01 DIAGNOSIS — E039 Hypothyroidism, unspecified: Secondary | ICD-10-CM | POA: Diagnosis present

## 2023-08-01 DIAGNOSIS — Z85118 Personal history of other malignant neoplasm of bronchus and lung: Secondary | ICD-10-CM

## 2023-08-01 DIAGNOSIS — I358 Other nonrheumatic aortic valve disorders: Secondary | ICD-10-CM | POA: Diagnosis present

## 2023-08-01 DIAGNOSIS — I2722 Pulmonary hypertension due to left heart disease: Secondary | ICD-10-CM | POA: Diagnosis present

## 2023-08-01 DIAGNOSIS — K5900 Constipation, unspecified: Secondary | ICD-10-CM | POA: Diagnosis present

## 2023-08-01 DIAGNOSIS — J441 Chronic obstructive pulmonary disease with (acute) exacerbation: Secondary | ICD-10-CM | POA: Diagnosis present

## 2023-08-01 DIAGNOSIS — R188 Other ascites: Secondary | ICD-10-CM | POA: Diagnosis present

## 2023-08-01 DIAGNOSIS — E669 Obesity, unspecified: Secondary | ICD-10-CM | POA: Diagnosis present

## 2023-08-01 DIAGNOSIS — Z8542 Personal history of malignant neoplasm of other parts of uterus: Secondary | ICD-10-CM

## 2023-08-01 DIAGNOSIS — L89156 Pressure-induced deep tissue damage of sacral region: Secondary | ICD-10-CM | POA: Diagnosis not present

## 2023-08-01 DIAGNOSIS — J189 Pneumonia, unspecified organism: Secondary | ICD-10-CM | POA: Diagnosis not present

## 2023-08-01 DIAGNOSIS — I110 Hypertensive heart disease with heart failure: Secondary | ICD-10-CM | POA: Diagnosis present

## 2023-08-01 DIAGNOSIS — I9589 Other hypotension: Secondary | ICD-10-CM | POA: Diagnosis not present

## 2023-08-01 DIAGNOSIS — I5032 Chronic diastolic (congestive) heart failure: Secondary | ICD-10-CM | POA: Diagnosis not present

## 2023-08-01 DIAGNOSIS — B952 Enterococcus as the cause of diseases classified elsewhere: Secondary | ICD-10-CM | POA: Diagnosis present

## 2023-08-01 DIAGNOSIS — D696 Thrombocytopenia, unspecified: Secondary | ICD-10-CM | POA: Diagnosis not present

## 2023-08-01 DIAGNOSIS — Z79890 Hormone replacement therapy (postmenopausal): Secondary | ICD-10-CM

## 2023-08-01 DIAGNOSIS — J9622 Acute and chronic respiratory failure with hypercapnia: Secondary | ICD-10-CM | POA: Diagnosis present

## 2023-08-01 DIAGNOSIS — I483 Typical atrial flutter: Secondary | ICD-10-CM | POA: Diagnosis present

## 2023-08-01 DIAGNOSIS — J1569 Pneumonia due to other gram-negative bacteria: Principal | ICD-10-CM | POA: Diagnosis present

## 2023-08-01 DIAGNOSIS — I480 Paroxysmal atrial fibrillation: Secondary | ICD-10-CM | POA: Diagnosis present

## 2023-08-01 DIAGNOSIS — K921 Melena: Secondary | ICD-10-CM | POA: Diagnosis not present

## 2023-08-01 DIAGNOSIS — C3411 Malignant neoplasm of upper lobe, right bronchus or lung: Secondary | ICD-10-CM | POA: Diagnosis present

## 2023-08-01 DIAGNOSIS — Z9889 Other specified postprocedural states: Secondary | ICD-10-CM

## 2023-08-01 DIAGNOSIS — I10 Essential (primary) hypertension: Secondary | ICD-10-CM | POA: Diagnosis present

## 2023-08-01 DIAGNOSIS — D62 Acute posthemorrhagic anemia: Secondary | ICD-10-CM | POA: Diagnosis not present

## 2023-08-01 DIAGNOSIS — Z820 Family history of epilepsy and other diseases of the nervous system: Secondary | ICD-10-CM

## 2023-08-01 DIAGNOSIS — J410 Simple chronic bronchitis: Secondary | ICD-10-CM | POA: Diagnosis not present

## 2023-08-01 DIAGNOSIS — Z7901 Long term (current) use of anticoagulants: Secondary | ICD-10-CM

## 2023-08-01 DIAGNOSIS — Z7984 Long term (current) use of oral hypoglycemic drugs: Secondary | ICD-10-CM

## 2023-08-01 DIAGNOSIS — R251 Tremor, unspecified: Secondary | ICD-10-CM | POA: Diagnosis present

## 2023-08-01 DIAGNOSIS — Z993 Dependence on wheelchair: Secondary | ICD-10-CM

## 2023-08-01 DIAGNOSIS — A498 Other bacterial infections of unspecified site: Secondary | ICD-10-CM

## 2023-08-01 DIAGNOSIS — I4819 Other persistent atrial fibrillation: Secondary | ICD-10-CM | POA: Diagnosis present

## 2023-08-01 DIAGNOSIS — Z90710 Acquired absence of both cervix and uterus: Secondary | ICD-10-CM

## 2023-08-01 DIAGNOSIS — Z860100 Personal history of colon polyps, unspecified: Secondary | ICD-10-CM

## 2023-08-01 DIAGNOSIS — R7881 Bacteremia: Secondary | ICD-10-CM | POA: Diagnosis present

## 2023-08-01 DIAGNOSIS — Y95 Nosocomial condition: Secondary | ICD-10-CM | POA: Diagnosis present

## 2023-08-01 DIAGNOSIS — R0602 Shortness of breath: Principal | ICD-10-CM

## 2023-08-01 DIAGNOSIS — I872 Venous insufficiency (chronic) (peripheral): Secondary | ICD-10-CM | POA: Diagnosis present

## 2023-08-01 DIAGNOSIS — D72829 Elevated white blood cell count, unspecified: Secondary | ICD-10-CM | POA: Diagnosis present

## 2023-08-01 DIAGNOSIS — Z8249 Family history of ischemic heart disease and other diseases of the circulatory system: Secondary | ICD-10-CM

## 2023-08-01 DIAGNOSIS — R54 Age-related physical debility: Secondary | ICD-10-CM | POA: Diagnosis present

## 2023-08-01 DIAGNOSIS — J9621 Acute and chronic respiratory failure with hypoxia: Secondary | ICD-10-CM | POA: Diagnosis present

## 2023-08-01 DIAGNOSIS — Z9981 Dependence on supplemental oxygen: Secondary | ICD-10-CM

## 2023-08-01 DIAGNOSIS — J449 Chronic obstructive pulmonary disease, unspecified: Secondary | ICD-10-CM | POA: Diagnosis present

## 2023-08-01 DIAGNOSIS — Z6829 Body mass index (BMI) 29.0-29.9, adult: Secondary | ICD-10-CM

## 2023-08-01 DIAGNOSIS — Z9049 Acquired absence of other specified parts of digestive tract: Secondary | ICD-10-CM

## 2023-08-01 DIAGNOSIS — I2510 Atherosclerotic heart disease of native coronary artery without angina pectoris: Secondary | ICD-10-CM | POA: Diagnosis not present

## 2023-08-01 DIAGNOSIS — K922 Gastrointestinal hemorrhage, unspecified: Secondary | ICD-10-CM | POA: Insufficient documentation

## 2023-08-01 DIAGNOSIS — R195 Other fecal abnormalities: Secondary | ICD-10-CM | POA: Diagnosis not present

## 2023-08-01 DIAGNOSIS — J939 Pneumothorax, unspecified: Secondary | ICD-10-CM | POA: Diagnosis present

## 2023-08-01 DIAGNOSIS — Z1152 Encounter for screening for COVID-19: Secondary | ICD-10-CM | POA: Diagnosis not present

## 2023-08-01 DIAGNOSIS — J439 Emphysema, unspecified: Secondary | ICD-10-CM | POA: Diagnosis present

## 2023-08-01 DIAGNOSIS — I4892 Unspecified atrial flutter: Secondary | ICD-10-CM | POA: Diagnosis not present

## 2023-08-01 DIAGNOSIS — I252 Old myocardial infarction: Secondary | ICD-10-CM

## 2023-08-01 DIAGNOSIS — Z8701 Personal history of pneumonia (recurrent): Secondary | ICD-10-CM

## 2023-08-01 DIAGNOSIS — X58XXXA Exposure to other specified factors, initial encounter: Secondary | ICD-10-CM | POA: Diagnosis present

## 2023-08-01 DIAGNOSIS — Z7951 Long term (current) use of inhaled steroids: Secondary | ICD-10-CM

## 2023-08-01 DIAGNOSIS — I959 Hypotension, unspecified: Secondary | ICD-10-CM | POA: Diagnosis not present

## 2023-08-01 DIAGNOSIS — I5082 Biventricular heart failure: Secondary | ICD-10-CM | POA: Diagnosis present

## 2023-08-01 DIAGNOSIS — I5033 Acute on chronic diastolic (congestive) heart failure: Secondary | ICD-10-CM | POA: Diagnosis present

## 2023-08-01 DIAGNOSIS — Z923 Personal history of irradiation: Secondary | ICD-10-CM

## 2023-08-01 DIAGNOSIS — E785 Hyperlipidemia, unspecified: Secondary | ICD-10-CM | POA: Diagnosis present

## 2023-08-01 DIAGNOSIS — Z9012 Acquired absence of left breast and nipple: Secondary | ICD-10-CM

## 2023-08-01 DIAGNOSIS — Z853 Personal history of malignant neoplasm of breast: Secondary | ICD-10-CM

## 2023-08-01 DIAGNOSIS — Z79899 Other long term (current) drug therapy: Secondary | ICD-10-CM

## 2023-08-01 DIAGNOSIS — E11649 Type 2 diabetes mellitus with hypoglycemia without coma: Secondary | ICD-10-CM | POA: Diagnosis not present

## 2023-08-01 DIAGNOSIS — Z87891 Personal history of nicotine dependence: Secondary | ICD-10-CM

## 2023-08-01 DIAGNOSIS — E8809 Other disorders of plasma-protein metabolism, not elsewhere classified: Secondary | ICD-10-CM | POA: Diagnosis present

## 2023-08-01 DIAGNOSIS — Z8673 Personal history of transient ischemic attack (TIA), and cerebral infarction without residual deficits: Secondary | ICD-10-CM

## 2023-08-01 DIAGNOSIS — R339 Retention of urine, unspecified: Secondary | ICD-10-CM | POA: Diagnosis not present

## 2023-08-01 LAB — COMPREHENSIVE METABOLIC PANEL
ALT: 52 U/L — ABNORMAL HIGH (ref 0–44)
AST: 47 U/L — ABNORMAL HIGH (ref 15–41)
Albumin: 2.5 g/dL — ABNORMAL LOW (ref 3.5–5.0)
Alkaline Phosphatase: 86 U/L (ref 38–126)
Anion gap: 7 (ref 5–15)
BUN: 39 mg/dL — ABNORMAL HIGH (ref 8–23)
CO2: 40 mmol/L — ABNORMAL HIGH (ref 22–32)
Calcium: 7.9 mg/dL — ABNORMAL LOW (ref 8.9–10.3)
Chloride: 94 mmol/L — ABNORMAL LOW (ref 98–111)
Creatinine, Ser: 0.78 mg/dL (ref 0.44–1.00)
GFR, Estimated: >60 mL/min (ref >60)
Glucose, Bld: 114 mg/dL — ABNORMAL HIGH (ref 70–99)
Potassium: 3.9 mmol/L (ref 3.5–5.1)
Sodium: 141 mmol/L (ref 135–145)
Total Bilirubin: 0.7 mg/dL (ref <1.2)
Total Protein: 5 g/dL — ABNORMAL LOW (ref 6.5–8.1)

## 2023-08-01 LAB — MAGNESIUM: Magnesium: 1.7 mg/dL (ref 1.7–2.4)

## 2023-08-01 LAB — CBC WITH DIFFERENTIAL/PLATELET
Abs Immature Granulocytes: 0.32 10*3/uL — ABNORMAL HIGH (ref 0.00–0.07)
Basophils Absolute: 0.1 10*3/uL (ref 0.0–0.1)
Basophils Relative: 1 %
Eosinophils Absolute: 0 10*3/uL (ref 0.0–0.5)
Eosinophils Relative: 0 %
HCT: 47.5 % — ABNORMAL HIGH (ref 36.0–46.0)
Hemoglobin: 14.9 g/dL (ref 12.0–15.0)
Immature Granulocytes: 2 %
Lymphocytes Relative: 3 %
Lymphs Abs: 0.6 10*3/uL — ABNORMAL LOW (ref 0.7–4.0)
MCH: 29.9 pg (ref 26.0–34.0)
MCHC: 31.4 g/dL (ref 30.0–36.0)
MCV: 95.4 fL (ref 80.0–100.0)
Monocytes Absolute: 1.3 10*3/uL — ABNORMAL HIGH (ref 0.1–1.0)
Monocytes Relative: 6 %
Neutro Abs: 18.2 10*3/uL — ABNORMAL HIGH (ref 1.7–7.7)
Neutrophils Relative %: 88 %
Platelets: 171 10*3/uL (ref 150–400)
RBC: 4.98 MIL/uL (ref 3.87–5.11)
RDW: 16.7 % — ABNORMAL HIGH (ref 11.5–15.5)
WBC: 20.5 10*3/uL — ABNORMAL HIGH (ref 4.0–10.5)
nRBC: 0 % (ref 0.0–0.2)

## 2023-08-01 LAB — BLOOD GAS, ARTERIAL
Acid-Base Excess: 15.8 mmol/L — ABNORMAL HIGH (ref 0.0–2.0)
Bicarbonate: 45.1 mmol/L — ABNORMAL HIGH (ref 20.0–28.0)
Drawn by: 331471
O2 Saturation: 98.4 %
Patient temperature: 37
pCO2 arterial: 78 mm[Hg] (ref 32–48)
pH, Arterial: 7.37 (ref 7.35–7.45)
pO2, Arterial: 98 mm[Hg] (ref 83–108)

## 2023-08-01 LAB — URINALYSIS, W/ REFLEX TO CULTURE (INFECTION SUSPECTED)
Bacteria, UA: NONE SEEN
Bilirubin Urine: NEGATIVE
Glucose, UA: 150 mg/dL — AB
Hgb urine dipstick: NEGATIVE
Ketones, ur: NEGATIVE mg/dL
Nitrite: NEGATIVE
Protein, ur: NEGATIVE mg/dL
Specific Gravity, Urine: 1.011 (ref 1.005–1.030)
Trans Epithel, UA: 2
pH: 5 (ref 5.0–8.0)

## 2023-08-01 LAB — BLOOD GAS, VENOUS
Acid-Base Excess: 16.3 mmol/L — ABNORMAL HIGH (ref 0.0–2.0)
Bicarbonate: 46.2 mmol/L — ABNORMAL HIGH (ref 20.0–28.0)
O2 Saturation: 74.8 %
Patient temperature: 37
pCO2, Ven: 80 mm[Hg] (ref 44–60)
pH, Ven: 7.37 (ref 7.25–7.43)
pO2, Ven: 42 mm[Hg] (ref 32–45)

## 2023-08-01 LAB — RESP PANEL BY RT-PCR (RSV, FLU A&B, COVID)  RVPGX2
Influenza A by PCR: NEGATIVE
Influenza B by PCR: NEGATIVE
Resp Syncytial Virus by PCR: NEGATIVE
SARS Coronavirus 2 by RT PCR: NEGATIVE

## 2023-08-01 LAB — DIGOXIN LEVEL: Digoxin Level: 0.9 ng/mL (ref 0.8–2.0)

## 2023-08-01 LAB — ETHANOL: Alcohol, Ethyl (B): <10 mg/dL (ref <10)

## 2023-08-01 LAB — TROPONIN I (HIGH SENSITIVITY)
Troponin I (High Sensitivity): 73 ng/L — ABNORMAL HIGH (ref <18)
Troponin I (High Sensitivity): 75 ng/L — ABNORMAL HIGH (ref <18)

## 2023-08-01 LAB — BRAIN NATRIURETIC PEPTIDE: B Natriuretic Peptide: 862.2 pg/mL — ABNORMAL HIGH (ref 0.0–100.0)

## 2023-08-01 LAB — I-STAT CG4 LACTIC ACID, ED
Lactic Acid, Venous: 2.4 mmol/L (ref 0.5–1.9)
Lactic Acid, Venous: 2.1 mmol/L (ref 0.5–1.9)

## 2023-08-01 LAB — GLUCOSE, CAPILLARY: Glucose-Capillary: 119 mg/dL — ABNORMAL HIGH (ref 70–99)

## 2023-08-01 LAB — CBG MONITORING, ED: Glucose-Capillary: 124 mg/dL — ABNORMAL HIGH (ref 70–99)

## 2023-08-01 LAB — STREP PNEUMONIAE URINARY ANTIGEN: Strep Pneumo Urinary Antigen: NEGATIVE

## 2023-08-01 MED ORDER — DOCUSATE SODIUM 100 MG PO CAPS
100.0000 mg | ORAL_CAPSULE | Freq: Two times a day (BID) | ORAL | Status: DC
  Administered 2023-08-02 – 2023-08-10 (×10): 100 mg via ORAL
  Filled 2023-08-01 (×11): qty 1

## 2023-08-01 MED ORDER — DOCUSATE SODIUM 100 MG PO CAPS
100.0000 mg | ORAL_CAPSULE | Freq: Two times a day (BID) | ORAL | Status: DC
  Filled 2023-08-01: qty 1

## 2023-08-01 MED ORDER — VANCOMYCIN HCL 1.5 G IV SOLR
1500.0000 mg | Freq: Once | INTRAVENOUS | Status: AC
  Administered 2023-08-01: 1500 mg via INTRAVENOUS
  Filled 2023-08-01: qty 30

## 2023-08-01 MED ORDER — ACETAMINOPHEN 650 MG RE SUPP: 650.0000 mg | Freq: Four times a day (QID) | RECTAL | Status: DC | PRN

## 2023-08-01 MED ORDER — METHYLPREDNISOLONE SODIUM SUCC 125 MG IJ SOLR
125.0000 mg | Freq: Once | INTRAMUSCULAR | Status: AC
Start: 2023-08-01 — End: 2023-08-01
  Administered 2023-08-01: 125 mg via INTRAVENOUS
  Filled 2023-08-01: qty 2

## 2023-08-01 MED ORDER — NITROGLYCERIN 0.4 MG SL SUBL: 0.4000 mg | SUBLINGUAL_TABLET | SUBLINGUAL | Status: DC | PRN

## 2023-08-01 MED ORDER — IOHEXOL 350 MG/ML SOLN
75.0000 mL | Freq: Once | INTRAVENOUS | Status: AC | PRN
  Administered 2023-08-01: 75 mL via INTRAVENOUS

## 2023-08-01 MED ORDER — TORSEMIDE 20 MG PO TABS
40.0000 mg | ORAL_TABLET | Freq: Two times a day (BID) | ORAL | Status: DC
  Administered 2023-08-02 – 2023-08-03 (×3): 40 mg via ORAL
  Filled 2023-08-01 (×6): qty 2

## 2023-08-01 MED ORDER — MOMETASONE FURO-FORMOTEROL FUM 200-5 MCG/ACT IN AERO
2.0000 | INHALATION_SPRAY | Freq: Two times a day (BID) | RESPIRATORY_TRACT | Status: DC
  Administered 2023-08-02 – 2023-08-12 (×19): 2 via RESPIRATORY_TRACT
  Filled 2023-08-01: qty 8.8

## 2023-08-01 MED ORDER — DILTIAZEM HCL ER COATED BEADS 120 MG PO CP24
120.0000 mg | ORAL_CAPSULE | Freq: Every day | ORAL | Status: DC
  Administered 2023-08-02 – 2023-08-06 (×5): 120 mg via ORAL
  Filled 2023-08-01 (×5): qty 1

## 2023-08-01 MED ORDER — MIDODRINE HCL 5 MG PO TABS: 5.0000 mg | ORAL_TABLET | Freq: Three times a day (TID) | ORAL | Status: DC

## 2023-08-01 MED ORDER — MIDODRINE HCL 5 MG PO TABS
5.0000 mg | ORAL_TABLET | Freq: Three times a day (TID) | ORAL | Status: DC
Start: 2023-08-01 — End: 2023-08-06
  Administered 2023-08-01 – 2023-08-06 (×16): 5 mg via ORAL
  Filled 2023-08-01 (×16): qty 1

## 2023-08-01 MED ORDER — VANCOMYCIN HCL IN DEXTROSE 1-5 GM/200ML-% IV SOLN: 1000.0000 mg | INTRAVENOUS | Status: DC

## 2023-08-01 MED ORDER — SENNOSIDES-DOCUSATE SODIUM 8.6-50 MG PO TABS: 1.0000 | ORAL_TABLET | Freq: Every evening | ORAL | Status: DC | PRN

## 2023-08-01 MED ORDER — CEFEPIME HCL 2 G IV SOLR
2.0000 g | Freq: Once | INTRAVENOUS | Status: AC
  Administered 2023-08-01: 2 g via INTRAVENOUS
  Filled 2023-08-01: qty 12.5

## 2023-08-01 MED ORDER — AMITRIPTYLINE HCL 25 MG PO TABS
50.0000 mg | ORAL_TABLET | Freq: Every day | ORAL | Status: DC
Start: 2023-08-02 — End: 2023-08-12
  Administered 2023-08-02 – 2023-08-11 (×9): 50 mg via ORAL
  Filled 2023-08-01 (×9): qty 2

## 2023-08-01 MED ORDER — BENZONATATE 100 MG PO CAPS
100.0000 mg | ORAL_CAPSULE | Freq: Three times a day (TID) | ORAL | Status: DC | PRN
  Administered 2023-08-03 – 2023-08-05 (×3): 100 mg via ORAL
  Filled 2023-08-01 (×4): qty 1

## 2023-08-01 MED ORDER — ONDANSETRON HCL 4 MG PO TABS: 4.0000 mg | ORAL_TABLET | Freq: Four times a day (QID) | ORAL | Status: DC | PRN

## 2023-08-01 MED ORDER — INSULIN ASPART 100 UNIT/ML IJ SOLN
0.0000 [IU] | Freq: Three times a day (TID) | INTRAMUSCULAR | Status: DC
  Filled 2023-08-01: qty 0.15

## 2023-08-01 MED ORDER — INSULIN ASPART 100 UNIT/ML IJ SOLN
0.0000 [IU] | Freq: Every day | INTRAMUSCULAR | Status: DC
  Filled 2023-08-01: qty 0.05

## 2023-08-01 MED ORDER — APIXABAN 5 MG PO TABS
5.0000 mg | ORAL_TABLET | Freq: Two times a day (BID) | ORAL | Status: DC
  Administered 2023-08-01 – 2023-08-06 (×10): 5 mg via ORAL
  Filled 2023-08-01 (×10): qty 1

## 2023-08-01 MED ORDER — PIPERACILLIN-TAZOBACTAM 3.375 G IVPB
3.3750 g | Freq: Three times a day (TID) | INTRAVENOUS | Status: DC
  Administered 2023-08-02 (×3): 3.375 g via INTRAVENOUS
  Filled 2023-08-01 (×3): qty 50

## 2023-08-01 MED ORDER — ACETAMINOPHEN 325 MG PO TABS
650.0000 mg | ORAL_TABLET | Freq: Four times a day (QID) | ORAL | Status: DC | PRN
  Administered 2023-08-06 – 2023-08-10 (×5): 650 mg via ORAL
  Filled 2023-08-01 (×6): qty 2

## 2023-08-01 MED ORDER — ATORVASTATIN CALCIUM 40 MG PO TABS
40.0000 mg | ORAL_TABLET | Freq: Every day | ORAL | Status: DC
  Filled 2023-08-01: qty 1

## 2023-08-01 MED ORDER — CHLORHEXIDINE GLUCONATE CLOTH 2 % EX PADS
6.0000 | MEDICATED_PAD | Freq: Every day | CUTANEOUS | Status: DC
  Administered 2023-08-01 – 2023-08-11 (×11): 6 via TOPICAL

## 2023-08-01 MED ORDER — AMIODARONE HCL 200 MG PO TABS
400.0000 mg | ORAL_TABLET | Freq: Two times a day (BID) | ORAL | Status: DC
  Administered 2023-08-01 – 2023-08-03 (×4): 400 mg via ORAL
  Filled 2023-08-01 (×4): qty 2

## 2023-08-01 MED ORDER — LEVALBUTEROL HCL 0.63 MG/3ML IN NEBU
0.6300 mg | INHALATION_SOLUTION | Freq: Four times a day (QID) | RESPIRATORY_TRACT | Status: DC | PRN
  Administered 2023-08-02 – 2023-08-09 (×4): 0.63 mg via RESPIRATORY_TRACT
  Filled 2023-08-01 (×4): qty 3

## 2023-08-01 MED ORDER — ONDANSETRON HCL 4 MG/2ML IJ SOLN: 4.0000 mg | Freq: Four times a day (QID) | INTRAMUSCULAR | Status: DC | PRN

## 2023-08-01 MED ORDER — INSULIN ASPART 100 UNIT/ML IJ SOLN
0.0000 [IU] | INTRAMUSCULAR | Status: DC
  Administered 2023-08-02 (×4): 2 [IU] via SUBCUTANEOUS
  Administered 2023-08-03 (×2): 3 [IU] via SUBCUTANEOUS
  Administered 2023-08-03 – 2023-08-04 (×4): 2 [IU] via SUBCUTANEOUS
  Filled 2023-08-01: qty 0.15

## 2023-08-01 MED ORDER — DIGOXIN 125 MCG PO TABS
0.1250 mg | ORAL_TABLET | Freq: Every day | ORAL | Status: DC
  Administered 2023-08-01 – 2023-08-11 (×9): 0.125 mg via ORAL
  Filled 2023-08-01 (×10): qty 1

## 2023-08-01 MED ORDER — LEVOTHYROXINE SODIUM 88 MCG PO TABS
88.0000 ug | ORAL_TABLET | Freq: Every day | ORAL | Status: DC
  Administered 2023-08-03 – 2023-08-11 (×9): 88 ug via ORAL
  Filled 2023-08-01 (×10): qty 1

## 2023-08-01 NOTE — H&P (Signed)
PCP:   Burton Apley, MD   Chief Complaint:  Shortness of breath  HPI: This is a 77 year old female with past medical history of CAD ( h/o CABG 26), COPD, HTN, HLD, hypothyroidism, T2DM, chronic respiratory failure 3L,  h/o breast and lung cancer.  Patient recently admitted 11/25 - 12/6 with anasarca, CHF exacerbation, A-fib with RVR.  Patient treated with diuresis.  She underwent TEE/DCCV for A-fib.  Patient discharged to [facility].  Patient brought back in after she became short of breath, wheezing and altered.  In the ER patient's vitals 113/58, 91, 22, afebrile.  Patient hypoxic to 86% on home 3 L oxygen. HR 150.  VBG pH 7.37, pCO2 80..  Lactic acid 2.4=> 2.1.  WBC 20.5.  Troponin 73=> 75 [baseline 40s], EKG atrial flutter HR 72, QTc 437, BNP 862 baseline 1, 287 done 12/13].  AST 47/ALT 52 [normal].  Albumin 2.5.  COVID panel negative.  CT chest suggestive of pneumonia. Patient placed on BiPAP with her hypercapnia increased work of breathing.  Blood cultures x 2 collected.  Cefepime and vancomycin given.  Review of Systems:  Per HPI  Past Medical History: Past Medical History:  Diagnosis Date   Acute exacerbation of CHF (congestive heart failure) (HCC) 07/12/2023   Arthritis    Breast cancer (HCC) dx'd 09/2015   COPD (chronic obstructive pulmonary disease) (HCC)    Coronary artery disease    Depression    Diabetes mellitus (HCC) 10/22/2015   Full dentures    full upper dentures, plate on bottom   History of bronchitis    History of pneumonia    Hyperlipidemia    Hypertension    Hypothyroidism 10/22/2015   lung ca dx;d 09/2015   lung cancer   Myocardial infarction [facility]) July 1995   Osteoporosis 10/22/2015   Personal history of radiation therapy    Stress incontinence    Uterine cancer (HCC) dx'd 1968   Past Surgical History:  Procedure Laterality Date   ABDOMINAL HYSTERECTOMY     1968   APPENDECTOMY     with hysterectomy   BLADDER SURGERY     1972   BREAST BIOPSY  Left 11/04/2015   Malignant   BREAST BIOPSY Left 11/04/2015   Malignant   BREAST LUMPECTOMY Left 11/19/2015   CARDIAC CATHETERIZATION  1995, 1996   Dr. 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   LUNG BIOPSY N/A 11/13/2015   Procedure: RIGHT LUNG BIOPSY;  Surgeon: Delight Ovens, MD;  Location: [facility] OR;  Service: Thoracic;  Laterality: N/A;   MULTIPLE TOOTH EXTRACTIONS     RADIOACTIVE SEED GUIDED PARTIAL MASTECTOMY WITH AXILLARY SENTINEL LYMPH NODE BIOPSY Left 11/19/2015   Procedure: RADIOACTIVE SEED GUIDED PARTIAL MASTECTOMY WITH AXILLARY SENTINEL LYMPH NODE BIOPSY;  Surgeon: Manus Rudd, MD;  Location: [facility];  Service: General;  Laterality: Left;   RE-EXCISION OF BREAST LUMPECTOMY Left 12/03/2015   Procedure: RE-EXCISION INFERIOR MARGIN OF LEFT BREAST LUMPECTOMY;  Surgeon: Manus Rudd, MD;  Location: [facility] OR;  Service:  General;  Laterality: Left;   TMJ ARTHROPLASTY     1979   TONSILLECTOMY     TRANSESOPHAGEAL ECHOCARDIOGRAM (CATH LAB) N/A 07/16/2023   Procedure: TRANSESOPHAGEAL ECHOCARDIOGRAM;  Surgeon: Sande Rives, MD;  Location: [facility] INVASIVE CV LAB;  Service: Cardiovascular;  Laterality: N/A;   VIDEO BRONCHOSCOPY WITH ENDOBRONCHIAL NAVIGATION N/A 11/13/2015   Procedure: VIDEO BRONCHOSCOPY WITH ENDOBRONCHIAL NAVIGATION, with placement of fudicial markers;  Surgeon: Delight Ovens, MD;  Location: [facility] OR;  Service: Thoracic;  Laterality: N/A;    Medications: Prior to Admission medications   Medication Sig Start Date End Date Taking? Authorizing  Provider  albuterol (VENTOLIN HFA) 108 (90 Base) MCG/ACT inhaler Inhale 2 puffs into the lungs 4 (four) times daily as needed for shortness of breath. 05/18/21   [provider]  amiodarone (PACERONE) 400 MG tablet Take 1 tablet (400 mg total) by mouth 2 (two) times daily. 07/23/23   Briant Cedar, MD  amitriptyline (ELAVIL) 50 MG tablet Take 50 mg by mouth at bedtime.      [provider]  apixaban (ELIQUIS) 5 MG TABS tablet Take 1 tablet (5 mg total) by mouth 2 (two) times daily. 07/23/23   Briant Cedar, MD  atorvastatin (LIPITOR) 40 MG tablet Take 40 mg by mouth at bedtime.     [provider]  Calcium Citrate-Vitamin D (CALCIUM + D PO) Take 2 tablets by mouth 3 (three) times daily. Each tablet Calcium 300 mg, Vitamin D3 250 I.U.    [provider]  digoxin (LANOXIN) 0.125 MG tablet Take 1 tablet (0.125 mg total) by mouth daily. 07/24/23   Briant Cedar, MD  diltiazem (CARDIZEM CD) 120 MG 24 hr capsule Take 1 capsule (120 mg total) by mouth daily. 07/24/23   Briant Cedar, MD  docusate sodium (COLACE) 100 MG capsule Take 1 capsule (100 mg total) by mouth 2 (two) times daily. 07/23/23   Briant Cedar, MD  FARXIGA 10 MG TABS tablet TAKE 1 TABLET BY MOUTH DAILY BEFORE BREAKFAST. 10/02/22   Georgeanna Lea, MD  ketoconazole (NIZORAL) 2 % cream Apply 1 application topically daily. Patient taking differently: Apply 1 application  topically daily as needed for irritation. 06/04/20   Magrinat, Valentino Hue, MD  KLOR-CON M20 20 MEQ tablet TAKE 1 TABLET BY MOUTH TWICE A DAY 06/11/23   Georgeanna Lea, MD  lactulose (CHRONULAC) 10 GM/15ML solution Take 30 mLs (20 g total) by mouth 2 (two) times daily as needed for mild constipation. 07/23/23   Briant Cedar, MD  levalbuterol Pauline Aus) 0.63 MG/3ML nebulizer solution Take 3 mLs (0.63 mg total) by nebulization every 6 (six) hours as needed for wheezing or shortness of breath. 07/23/23    Briant Cedar, MD  levothyroxine (SYNTHROID, LEVOTHROID) 88 MCG tablet Take 88 mcg by mouth daily before breakfast.     [provider]  metFORMIN (GLUCOPHAGE) 500 MG tablet Take 500 mg by mouth daily. 09/17/15   [provider]  midodrine (PROAMATINE) 5 MG tablet Take 1 tablet (5 mg total) by mouth 3 (three) times daily with meals. 07/23/23   Briant Cedar, MD  mometasone-formoterol (DULERA) 200-5 MCG/ACT AERO Inhale 2 puffs into the lungs 2 (two) times daily. 07/23/23   Briant Cedar, MD  nitroGLYCERIN (NITROSTAT) 0.4 MG SL tablet Place 1 tablet (0.4 mg total) under the tongue every 5 (five) minutes as needed for chest pain. Reported on 01/28/2016 05/29/21   Baldo Daub, MD  predniSONE (  DELTASONE) 10 MG tablet Take 4 tablets (40 mg total) by mouth daily with breakfast for 2 days, THEN 3 tablets (30 mg total) daily with breakfast for 2 days, THEN 2 tablets (20 mg total) daily with breakfast for 2 days, THEN 1 tablet (10 mg total) daily with breakfast for 2 days. 07/24/23 08/01/23  Briant Cedar, MD  torsemide (DEMADEX) 20 MG tablet Take 2 tablets (40 mg total) by mouth 2 (two) times daily. 07/02/23   Sharlene Dory, PA-C    Allergies:  No Known Allergies  Social History:  reports that she quit smoking about 28 years ago. Her smoking use included cigarettes. She has been exposed to tobacco smoke. She has never used smokeless tobacco. She reports that she does not drink alcohol and does not use drugs.  Family History: Family History  Problem Relation Age of Onset   Alzheimer's disease Mother    Heart attack Father    Breast cancer Neg Hx     Physical Exam: Vitals:   08/01/23 1800 08/01/23 1830 08/01/23 1915 08/01/23 1930  BP: (!) 114/57 119/70  109/63  Pulse: 96 (!) 31 92 88  Resp: (!) 24 19 19 18   Temp:      TempSrc:      SpO2: 96% 93% 98% 98%    General: Awake confused patient, well developed and nourished, on BiPAP Eyes: Pink  conjunctiva, no scleral icterus ENT: Moist oral mucosa, neck supple, no thyromegaly Lungs: clear, decreased air exchange, no use of accessory muscles Cardiovascular: RRR, no regurgitation, no gallops, no murmurs. No carotid bruits, no JVD Abdomen: soft, positive BS, NTND, no organomegaly, not an acute abdomen GU: not examined Neuro: CN II - XII appears grossly intact Musculoskeletal: Moves all extremities equally, no edema Skin: no rash, no subcutaneous crepitation, no decubitus Psych: Confused patient   Labs on Admission:  Recent Labs    07/30/23 2126 08/01/23 1650  NA 139 141  K 4.2 3.9  CL 92* 94*  CO2 38* 40*  GLUCOSE 156* 114*  BUN 40* 39*  CREATININE 0.99 0.78  CALCIUM 8.4* 7.9*   Recent Labs    08/01/23 1650  AST 47*  ALT 52*  ALKPHOS 86  BILITOT 0.7  PROT 5.0*  ALBUMIN 2.5*    Recent Labs    07/30/23 2126 08/01/23 1650  WBC 20.9* 20.5*  NEUTROABS  --  18.2*  HGB 14.6 14.9  HCT 46.6* 47.5*  MCV 92.6 95.4  PLT 174 171    Micro Results: Recent Results (from the past 240 hours)  Resp panel by RT-PCR (RSV, Flu A&B, Covid) Anterior Nasal Swab     Status: None   Collection Time: 07/30/23  9:26 PM   Specimen: Anterior Nasal Swab  Result Value Ref Range Status   SARS Coronavirus 2 by RT PCR NEGATIVE NEGATIVE Final   Influenza A by PCR NEGATIVE NEGATIVE Final   Influenza B by PCR NEGATIVE NEGATIVE Final    Comment: (NOTE) The Xpert Xpress SARS-CoV-2/FLU/RSV plus assay is intended as an aid in the diagnosis of influenza from Nasopharyngeal swab specimens and should not be used as a sole basis for treatment. Nasal washings and aspirates are unacceptable for Xpert Xpress SARS-CoV-2/FLU/RSV testing.  Fact Sheet for Patients: BloggerCourse.com  Fact Sheet for Healthcare Providers: SeriousBroker.it  This test is not yet approved or cleared by the [place] FDA and has been authorized for detection  and/or diagnosis of SARS-CoV-2 by FDA under an Emergency Use Authorization (EUA). This EUA  will remain in effect (meaning this test can be used) for the duration of the COVID-19 declaration under Section 564(b)(1) of the Act, 21 U.S.C. section 360bbb-3(b)(1), unless the authorization is terminated or revoked.     Resp Syncytial Virus by PCR NEGATIVE NEGATIVE Final    Comment: (NOTE) Fact Sheet for Patients: BloggerCourse.com  Fact Sheet for Healthcare Providers: SeriousBroker.it  This test is not yet approved or cleared by the [place] FDA and has been authorized for detection and/or diagnosis of SARS-CoV-2 by FDA under an Emergency Use Authorization (EUA). This EUA will remain in effect (meaning this test can be used) for the duration of the COVID-19 declaration under Section 564(b)(1) of the Act, 21 U.S.C. section 360bbb-3(b)(1), unless the authorization is terminated or revoked.  Performed at [facility], [address], [place], [place] [address]   Resp panel by RT-PCR (RSV, Flu A&B, Covid) Anterior Nasal Swab     Status: None   Collection Time: 08/01/23  4:43 PM   Specimen: Anterior Nasal Swab  Result Value Ref Range Status   SARS Coronavirus 2 by RT PCR NEGATIVE NEGATIVE Final    Comment: (NOTE) SARS-CoV-2 target nucleic acids are NOT DETECTED.  The SARS-CoV-2 RNA is generally detectable in upper respiratory specimens during the acute phase of infection. The lowest concentration of SARS-CoV-2 viral copies this assay can detect is 138 copies/mL. A negative result does not preclude SARS-Cov-2 infection and should not be used as the sole basis for treatment or other patient management decisions. A negative result may occur with  improper specimen collection/handling, submission of specimen other than nasopharyngeal swab, presence of viral mutation(s) within the areas targeted by this assay, and inadequate  number of viral copies(<138 copies/mL). A negative result must be combined with clinical observations, patient history, and epidemiological information. The expected result is Negative.  Fact Sheet for Patients:  BloggerCourse.com  Fact Sheet for Healthcare Providers:  SeriousBroker.it  This test is no t yet approved or cleared by the [place] FDA and  has been authorized for detection and/or diagnosis of SARS-CoV-2 by FDA under an Emergency Use Authorization (EUA). This EUA will remain  in effect (meaning this test can be used) for the duration of the COVID-19 declaration under Section 564(b)(1) of the Act, 21 U.S.C.section 360bbb-3(b)(1), unless the authorization is terminated  or revoked sooner.       Influenza A by PCR NEGATIVE NEGATIVE Final   Influenza B by PCR NEGATIVE NEGATIVE Final    Comment: (NOTE) The Xpert Xpress SARS-CoV-2/FLU/RSV plus assay is intended as an aid in the diagnosis of influenza from Nasopharyngeal swab specimens and should not be used as a sole basis for treatment. Nasal washings and aspirates are unacceptable for Xpert Xpress SARS-CoV-2/FLU/RSV testing.  Fact Sheet for Patients: BloggerCourse.com  Fact Sheet for Healthcare Providers: SeriousBroker.it  This test is not yet approved or cleared by the [place] FDA and has been authorized for detection and/or diagnosis of SARS-CoV-2 by FDA under an Emergency Use Authorization (EUA). This EUA will remain in effect (meaning this test can be used) for the duration of the COVID-19 declaration under Section 564(b)(1) of the Act, 21 U.S.C. section 360bbb-3(b)(1), unless the authorization is terminated or revoked.     Resp Syncytial Virus by PCR NEGATIVE NEGATIVE Final    Comment: (NOTE) Fact Sheet for Patients: BloggerCourse.com  Fact Sheet for Healthcare  Providers: SeriousBroker.it  This test is not yet approved or cleared by the [place] FDA and has  been authorized for detection and/or diagnosis of SARS-CoV-2 by FDA under an Emergency Use Authorization (EUA). This EUA will remain in effect (meaning this test can be used) for the duration of the COVID-19 declaration under Section 564(b)(1) of the Act, 21 U.S.C. section 360bbb-3(b)(1), unless the authorization is terminated or revoked.  Performed at [facility], [address] [place], [place] [address]      Radiological Exams on Admission: CT Angio Chest PE W and/or Wo Contrast Result Date: 08/01/2023 CLINICAL DATA:  Pulmonary embolism (PE) suspected, low to intermediate prob, neg D-dimer EXAM: CT ANGIOGRAPHY CHEST WITH CONTRAST TECHNIQUE: Multidetector CT imaging of the chest was performed using the standard protocol during bolus administration of intravenous contrast. Multiplanar CT image reconstructions and MIPs were obtained to evaluate the vascular anatomy. RADIATION DOSE REDUCTION: This exam was performed according to the departmental dose-optimization program which includes automated exposure control, adjustment of the mA and/or kV according to patient size and/or use of iterative reconstruction technique. CONTRAST:  75mL OMNIPAQUE IOHEXOL 350 MG/ML SOLN COMPARISON:  07/13/2023 FINDINGS: Cardiovascular: Satisfactory opacification of the pulmonary arteries. No pulmonary arterial filling defects. Pulmonary arteries are mildly dilated. Thoracic aorta is nonaneurysmal. Atherosclerotic calcifications of the aorta and coronary arteries. Prior CABG. Heart size is upper limits of normal. No pericardial effusion. Mediastinum/Nodes: No enlarged mediastinal, hilar, or axillary lymph nodes. Thyroid gland, trachea, and esophagus demonstrate no significant findings. Lungs/Pleura: Small layering bilateral pleural effusions. Dependent atelectasis or  consolidation of the left lower lobe and to a lesser degree within the right lower lobe and lingula. Chronic scarring within the right upper lobe associated with fiducial markers, unchanged. Background of emphysema. No pneumothorax. Upper Abdomen: Cholelithiasis. Stable bilateral low-attenuation adrenal nodules, previously described as adenomas. These do not require follow-up imaging. Small volume ascites within the upper abdomen. Musculoskeletal: Exaggerated thoracic kyphosis. Prior sternotomy. No new or acute bony findings. No significant chest wall abnormality. Review of the MIP images confirms the above findings. IMPRESSION: 1. No evidence of pulmonary embolism. 2. Small layering bilateral pleural effusions. Dependent atelectasis or consolidation of the left lower lobe and to a lesser degree within the right lower lobe and lingula. 3. Small volume ascites within the upper abdomen. 4. Cholelithiasis. 5. Aortic and coronary artery atherosclerosis (ICD10-I70.0). 6. Emphysema (ICD10-J43.9). Electronically Signed   By: Duanne Guess D.O.   On: 08/01/2023 19:15   CT Head Wo Contrast Result Date: 08/01/2023 CLINICAL DATA:  Mental status change EXAM: CT HEAD WITHOUT CONTRAST TECHNIQUE: Contiguous axial images were obtained from the base of the skull through the vertex without intravenous contrast. RADIATION DOSE REDUCTION: This exam was performed according to the departmental dose-optimization program which includes automated exposure control, adjustment of the mA and/or kV according to patient size and/or use of iterative reconstruction technique. COMPARISON:  CT head 07/12/2023 FINDINGS: Brain: No intracranial hemorrhage, mass effect, or evidence of acute infarct. No hydrocephalus. No extra-axial fluid collection. Age-commensurate cerebral atrophy and chronic small vessel ischemic disease. Vascular: No hyperdense vessel. Intracranial arterial calcification. Skull: No fracture or focal lesion. Sinuses/Orbits: No  acute finding. Other: None. IMPRESSION: No acute intracranial abnormality. Electronically Signed   By: Minerva Fester M.D.   On: 08/01/2023 19:12   DG Chest Port 1 View Result Date: 08/01/2023 CLINICAL DATA:  Shortness of breath, wheezing, low oxygen saturations EXAM: PORTABLE CHEST 1 VIEW COMPARISON:  07/30/2023 FINDINGS: Increased size of the cardiac silhouette compared with 07/30/2023. Aortic atherosclerotic calcification. Sternotomy and CABG increased bibasilar airspace and interstitial opacities. Increased  opacities about the surgical clips in the right upper lobe. Layering bilateral pleural effusions. No pneumothorax. IMPRESSION: 1. Increased size of the cardiac silhouette compared with 07/30/2023. This may be in part due to patient rotation however is concerning for pericardial effusion. CT is recommended for further evaluation. 2. Increased bibasilar airspace and interstitial opacities. Pulmonary edema is favored though multifocal pneumonia could appear similarly. 3. Layering bilateral pleural effusions. Electronically Signed   By: Minerva Fester M.D.   On: 08/01/2023 17:43   DG Chest Port 1 View Result Date: 07/30/2023 CLINICAL DATA:  Shortness of breath and COPD EXAM: PORTABLE CHEST 1 VIEW COMPARISON:  Chest x-ray 07/25/2023.  Chest CT 07/13/2023. FINDINGS: Focal parenchymal opacities and postsurgical changes in the right upper lobe have not significantly changed. There is a stable small left pleural effusion and left basilar infiltrates. The cardiomediastinal silhouette is unchanged. Sternotomy wires and mediastinal clips are again noted. No acute osseous abnormalities. IMPRESSION: 1. No significant change in focal parenchymal opacities and postsurgical changes in the right upper lobe. 2. Stable small left pleural effusion and left basilar infiltrates. Electronically Signed   By: Darliss Cheney M.D.   On: 07/30/2023 23:17    Assessment/Plan Present on Admission:  Acute on chronic respiratory  failure with hypercapnia (HCC)  HCAP -BiPAP initiated in ED, continued. -Pneumonia order set initiated.  Blood cultures x 2, sputum cultures, urinary Legionella and strep pneumonia collected. -Cefepime and vancomycin initiated in the ED.  Continue Zosyn and Vanco pharmacy dosing.  MRSA screen negative.  Vancomycin not initiated -Nebs as needed.  Tessalon Perles as needed   COPD (chronic obstructive pulmonary disease) (HCC) -Continue Dulera, oxygen, nebulizers   T2DM -Sliding scale insulin ordered.   Coronary artery disease //  Hypertension //  HFpEF -Continue Cardizem, atorvastatin, Eliquis -Continue midodrine, first dose now -Continue Demadex with hold parameters   HLD -Continue atorvastatin   Hypothyroidism -Continue Synthroid   PAF (paroxysmal atrial fibrillation) (HCC) - TEE-DCCV on 07/16/23. Amiodarone 400 gm BID started - Continue eliquis, digoxin.  Dig level ordered -Per cardiology, amiodarone not a great antiarrhythmic given her lung disease, but options are limited.  Patient currently in atrial flutter.  No RVR   Primary cancer of right upper lobe of lung (HCC)  History of breast cancer -2017.  Both in remission.   Denise Mckenzie 08/01/2023, 7:54 PM

## 2023-08-01 NOTE — ED Notes (Signed)
RT notified for pt transfer to ICU

## 2023-08-01 NOTE — Progress Notes (Signed)
RT transported patient to ICU without any issues

## 2023-08-01 NOTE — ED Triage Notes (Signed)
BIBA from [facility] for shortness of breath/wheezing/decreased O2 saturation, and AMS. Per EMS, family states that pt is usually A&O x 4, today she is rambling incoherently. 2 Duoneb tx given PTA

## 2023-08-01 NOTE — Progress Notes (Signed)
ED Pharmacy Antibiotic Sign Off An antibiotic consult was received from an ED provider for vancomycin and cefepime per pharmacy dosing for pneumonia. A chart review was completed to assess appropriateness.   The following one time order(s) were placed:  Vancomycin 1500mg  IV x1 Cefepime 2g IV x1  Further antibiotic and/or antibiotic pharmacy consults should be ordered by the admitting provider if indicated.   Thank you for allowing pharmacy to be a part of this patient's care.   Cherylin Mylar, PharmD Clinical Pharmacist  12/15/20245:41 PM

## 2023-08-01 NOTE — ED Notes (Signed)
Messick, md notified of critical lab called in

## 2023-08-01 NOTE — ED Provider Notes (Signed)
[place] EMERGENCY DEPARTMENT AT [facility] Provider Note   CSN: [id number] Arrival date & time: 08/01/23  1628     History  Chief Complaint  Patient presents with   Shortness of Breath   Altered Mental Status    Denise Mckenzie is a 77 y.o. female.  77 year old female with prior medical history as detailed below presents for evaluation.  Patient presents from [facility] with EMS transport.  Patient with chief complaint of wheezing, increased shortness of breath.  EMS reports hypoxia despite baseline 3 L nasal cannula.  Patient given 2 DuoNeb treatments during transport.  Family also concerned that the patient seems to be more confused than baseline.  She is "rambling" during conversation.  Family noted this change in MS over the last 1 to 2 days.  PMH includes CAD, CABG in 1996, CHF, AFIB on eliquis, COPD, hypertension, hyperlipidemia, breast cancer, lung cancer.  The history is provided by the patient, the EMS personnel and medical records.       Home Medications Prior to Admission medications   Medication Sig Start Date End Date Taking? Authorizing Provider  albuterol (VENTOLIN HFA) 108 (90 Base) MCG/ACT inhaler Inhale 2 puffs into the lungs 4 (four) times daily as needed for shortness of breath. 05/18/21   [provider]  amiodarone (PACERONE) 400 MG tablet Take 1 tablet (400 mg total) by mouth 2 (two) times daily. 07/23/23   Briant Cedar, MD  amitriptyline (ELAVIL) 50 MG tablet Take 50 mg by mouth at bedtime.      [provider]  apixaban (ELIQUIS) 5 MG TABS tablet Take 1 tablet (5 mg total) by mouth 2 (two) times daily. 07/23/23   Briant Cedar, MD  atorvastatin (LIPITOR) 40 MG tablet Take 40 mg by mouth at bedtime.     [provider]  Calcium Citrate-Vitamin D (CALCIUM + D PO) Take 2 tablets by mouth 3 (three) times daily. Each tablet Calcium 300 mg, Vitamin D3 250 I.U.    [provider]  digoxin  (LANOXIN) 0.125 MG tablet Take 1 tablet (0.125 mg total) by mouth daily. 07/24/23   Briant Cedar, MD  diltiazem (CARDIZEM CD) 120 MG 24 hr capsule Take 1 capsule (120 mg total) by mouth daily. 07/24/23   Briant Cedar, MD  docusate sodium (COLACE) 100 MG capsule Take 1 capsule (100 mg total) by mouth 2 (two) times daily. 07/23/23   Briant Cedar, MD  FARXIGA 10 MG TABS tablet TAKE 1 TABLET BY MOUTH DAILY BEFORE BREAKFAST. 10/02/22   Georgeanna Lea, MD  ketoconazole (NIZORAL) 2 % cream Apply 1 application topically daily. Patient taking differently: Apply 1 application  topically daily as needed for irritation. 06/04/20   Magrinat, Valentino Hue, MD  KLOR-CON M20 20 MEQ tablet TAKE 1 TABLET BY MOUTH TWICE A DAY 06/11/23   Georgeanna Lea, MD  lactulose (CHRONULAC) 10 GM/15ML solution Take 30 mLs (20 g total) by mouth 2 (two) times daily as needed for mild constipation. 07/23/23   Briant Cedar, MD  levalbuterol Pauline Aus) 0.63 MG/3ML nebulizer solution Take 3 mLs (0.63 mg total) by nebulization every 6 (six) hours as needed for wheezing or shortness of breath. 07/23/23   Briant Cedar, MD  levothyroxine (SYNTHROID, LEVOTHROID) 88 MCG tablet Take 88 mcg by mouth daily before breakfast.     [provider]  metFORMIN (GLUCOPHAGE) 500 MG tablet Take 500 mg by mouth daily. 09/17/15   [provider]  midodrine (PROAMATINE) 5 MG tablet Take 1 tablet (5 mg total) by mouth 3 (three) times daily with meals. 07/23/23   Briant Cedar, MD  mometasone-formoterol (DULERA) 200-5 MCG/ACT AERO Inhale 2 puffs into the lungs 2 (two) times daily. 07/23/23   Briant Cedar, MD  nitroGLYCERIN (NITROSTAT) 0.4 MG SL tablet Place 1 tablet (0.4 mg total) under the tongue every 5 (five) minutes as needed for chest pain. Reported on 01/28/2016 05/29/21   Baldo Daub, MD  predniSONE (DELTASONE) 10 MG tablet Take 4 tablets (40 mg total) by mouth daily with breakfast  for 2 days, THEN 3 tablets (30 mg total) daily with breakfast for 2 days, THEN 2 tablets (20 mg total) daily with breakfast for 2 days, THEN 1 tablet (10 mg total) daily with breakfast for 2 days. 07/24/23 08/01/23  Briant Cedar, MD  torsemide (DEMADEX) 20 MG tablet Take 2 tablets (40 mg total) by mouth 2 (two) times daily. 07/02/23   Sharlene Dory, PA-C      Allergies    Patient has no known allergies.    Review of Systems   Review of Systems  All other systems reviewed and are negative.   Physical Exam Updated Vital Signs BP (!) 113/58 (BP Location: Right Arm)   Pulse 91   Temp 98 F (36.7 C) (Oral)   Resp (!) 22   SpO2 97%  Physical Exam Vitals and nursing note reviewed.  Constitutional:      General: She is not in acute distress.    Appearance: Normal appearance. She is well-developed.  HENT:     Head: Normocephalic and atraumatic.  Eyes:     Conjunctiva/sclera: Conjunctivae normal.     Pupils: Pupils are equal, round, and reactive to light.  Cardiovascular:     Rate and Rhythm: Normal rate and regular rhythm.     Heart sounds: Normal heart sounds.  Pulmonary:     Effort: Pulmonary effort is normal. No respiratory distress.     Comments: Diffuse expiratory wheezes in all lung fields Abdominal:     General: There is no distension.     Palpations: Abdomen is soft.     Tenderness: There is no abdominal tenderness.  Musculoskeletal:        General: No deformity. Normal range of motion.     Cervical back: Normal range of motion and neck supple.  Skin:    General: Skin is warm and dry.  Neurological:     General: No focal deficit present.     Mental Status: She is alert and oriented to person, place, and time.     Comments: Alert, oriented to person, place, time, and situation.  Normal speech. Answers questions appropriately.   No facial droop.  Moves all 4 extremities equally.     ED Results / Procedures / Treatments   Labs (all labs ordered are  listed, but only abnormal results are displayed) Labs Reviewed  CULTURE, BLOOD (ROUTINE X 2)  CULTURE, BLOOD (ROUTINE X 2)  RESP PANEL BY RT-PCR (RSV, FLU A&B, COVID)  RVPGX2  BLOOD GAS, VENOUS  URINALYSIS, W/ REFLEX TO CULTURE (INFECTION SUSPECTED)  ETHANOL  COMPREHENSIVE METABOLIC PANEL  BRAIN NATRIURETIC PEPTIDE  CBC WITH DIFFERENTIAL/PLATELET  DIGOXIN LEVEL  I-STAT CG4 LACTIC ACID, ED  CBG MONITORING, ED  TROPONIN I (HIGH SENSITIVITY)    EKG None  Radiology DG Chest Port 1 View Result Date: 07/30/2023 CLINICAL DATA:  Shortness of breath and COPD EXAM: PORTABLE CHEST 1 VIEW  COMPARISON:  Chest x-ray 07/25/2023.  Chest CT 07/13/2023. FINDINGS: Focal parenchymal opacities and postsurgical changes in the right upper lobe have not significantly changed. There is a stable small left pleural effusion and left basilar infiltrates. The cardiomediastinal silhouette is unchanged. Sternotomy wires and mediastinal clips are again noted. No acute osseous abnormalities. IMPRESSION: 1. No significant change in focal parenchymal opacities and postsurgical changes in the right upper lobe. 2. Stable small left pleural effusion and left basilar infiltrates. Electronically Signed   By: Darliss Cheney M.D.   On: 07/30/2023 23:17    Procedures Procedures    Medications Ordered in ED Medications  methylPREDNISolone sodium succinate (SOLU-MEDROL) 125 mg/2 mL injection 125 mg (has no administration in time range)    ED Course/ Medical Decision Making/ A&P                                 Medical Decision Making Amount and/or Complexity of Data Reviewed Labs: ordered. Radiology: ordered.  Risk Prescription drug management. Decision regarding hospitalization.    Medical Screen Complete  This patient presented to the ED with complaint of sob/ams.  This complaint involves an extensive number of treatment options. The initial differential diagnosis includes, but is not limited to, copd  exacerbation, chf exacerbation, infection, metabolic abnormality, etc  This presentation is: Acute, Chronic, Self-Limited, Previously Undiagnosed, Uncertain Prognosis, Complicated, Systemic Symptoms, and Threat to Life/Bodily Function  Patient with increased wheezing/sob. Also with reported confusion. Worse since last ED evaluation.  Wheezing with increased FiO2 requirement and increased WOB. Mild hypercarbia. Improved with Bipap.   CXR/CT Chest suggestive of likely infection. Abx administered.   Possible element of CHF as well.  Hospitalist service aware of case and will evaluate for admission.   Additional history obtained: External records from outside sources obtained and reviewed including prior ED visits and prior Inpatient records.    Lab Tests:  I ordered and personally interpreted labs.  The pertinent results include:  cbc cmp trop lactic acid ua bnp   Imaging Studies ordered:  I ordered imaging studies including cxr ct chest  I independently visualized and interpreted obtained imaging which showed bilateral opacities, ? Edema vs infection I agree with the radiologist interpretation.   Cardiac Monitoring:  The patient was maintained on a cardiac monitor.  I personally viewed and interpreted the cardiac monitor which showed an underlying rhythm of: AFib   Medicines ordered:  I ordered medication including abx, solumedrol  for suspected pneumonia, copd exacerbation  Reevaluation of the patient after these medicines showed that the patient: improved  Problem List / ED Course:  Dyspnea, AMS   Reevaluation:  After the interventions noted above, I reevaluated the patient and found that they have: improved    Disposition:  After consideration of the diagnostic results and the patients response to treatment, I feel that the patent would benefit from admission.   CRITICAL CARE Performed by: Wynetta Fines   Total critical care time: 30 minutes  Critical  care time was exclusive of separately billable procedures and treating other patients.  Critical care was necessary to treat or prevent imminent or life-threatening deterioration.  Critical care was time spent personally by me on the following activities: development of treatment plan with patient and/or surrogate as well as nursing, discussions with consultants, evaluation of patient's response to treatment, examination of patient, obtaining history from patient or surrogate, ordering and performing treatments and interventions, ordering and  review of laboratory studies, ordering and review of radiographic studies, pulse oximetry and re-evaluation of patient's condition.          Final Clinical Impression(s) / ED Diagnoses Final diagnoses:  Shortness of breath    Rx / DC Orders ED Discharge Orders     None         Wynetta Fines, MD 08/01/23 1946

## 2023-08-01 NOTE — Progress Notes (Signed)
   08/01/23 2342  BiPAP/CPAP/SIPAP  BiPAP/CPAP/SIPAP Pt Type Adult  BiPAP/CPAP/SIPAP V60  Mask Type Full face mask  Mask Size Small  Set Rate (S)  16 breaths/min  Respiratory Rate 22 breaths/min  IPAP (S)  18 cmH20  EPAP (S)  8 cmH2O  FiO2 (%) (S)  45 % (Pt keeps taking and desaturating increase FIO2 due to that)  Minute Ventilation 10  Leak 3  Peak Inspiratory Pressure (PIP) 19  Tidal Volume (Vt) 405  Patient Home Equipment No  Auto Titrate No  Press High Alarm 35 cmH2O  Press Low Alarm 5 cmH2O  CPAP/SIPAP surface wiped down Yes  Oxygen Percent 45 %  BiPAP/CPAP /SiPAP Vitals  Pulse Rate (!) 128  Resp 16  BP 129/62  SpO2 96 %  Bilateral Breath Sounds Diminished  MEWS Score/Color  MEWS Score 2  MEWS Score Color Yellow

## 2023-08-01 NOTE — Progress Notes (Signed)
Pharmacy Antibiotic Note  Denise Mckenzie is a 77 y.o. female admitted on 08/01/2023 with pneumonia.  Pharmacy has been consulted for vancomycin and Zosyn dosing.  Patient presented to the ED with wheezing, shortness of breath, and increased confusion. WBC 20.9 on admission, LA 2.1, afebrile. CXR showing increased bibasilar airspace and interstitial opacities -- cannot rule out multifocal pneumonia. Patient received one-time doses of vancomycin and cefepime 12/15 PM while in the ED.  Plan: Start Zosyn 3.375g IV q8hrs  Start vancomycin 1000mg  IV q24hrs (eAUC 477 using Scr rounded to 0.8, IBW, Vd 0.72) Monitor renal function, cultures, and overall clinical picture    Temp (24hrs), Avg:98 F (36.7 C), Min:98 F (36.7 C), Max:98 F (36.7 C)  Recent Labs  Lab 07/25/23 2240 07/30/23 2126 07/30/23 2208 08/01/23 1650 08/01/23 1654 08/01/23 1844  WBC 23.5* 20.9*  --  20.5*  --   --   CREATININE 0.89 0.99  --  0.78  --   --   LATICACIDVEN  --   --  1.4  --  2.4* 2.1*    Estimated Creatinine Clearance: 52.1 mL/min (by C-G formula based on SCr of 0.78 mg/dL).    No Known Allergies  Antimicrobials this admission: 12/15 cefepime 2g x1 12/15 vancomycin >>  12/15 Zosyn >>  Dose adjustments this admission: N/A  Microbiology results: 12/15 BCx: in process 12/15 UCx: in process  12/15 Sputum: not yet collected  12/15 MRSA PCR: not yet collected   Thank you for allowing pharmacy to be a part of this patient's care.  Cherylin Mylar, PharmD Clinical Pharmacist  12/15/20248:15 PM

## 2023-08-01 NOTE — Progress Notes (Signed)
RT placed pt on BIPAP in ED due to ABG.

## 2023-08-01 NOTE — Progress Notes (Signed)
RT transported pt to CT on BIPAP with no complications.

## 2023-08-01 NOTE — ED Notes (Signed)
Patient transported to CT on BiPap with respiratory

## 2023-08-02 DIAGNOSIS — I5032 Chronic diastolic (congestive) heart failure: Secondary | ICD-10-CM | POA: Diagnosis not present

## 2023-08-02 DIAGNOSIS — J189 Pneumonia, unspecified organism: Secondary | ICD-10-CM | POA: Diagnosis not present

## 2023-08-02 DIAGNOSIS — I959 Hypotension, unspecified: Secondary | ICD-10-CM

## 2023-08-02 DIAGNOSIS — I480 Paroxysmal atrial fibrillation: Secondary | ICD-10-CM | POA: Diagnosis not present

## 2023-08-02 DIAGNOSIS — J9622 Acute and chronic respiratory failure with hypercapnia: Secondary | ICD-10-CM | POA: Diagnosis not present

## 2023-08-02 LAB — BLOOD CULTURE ID PANEL (REFLEXED) - BCID2

## 2023-08-02 LAB — GLUCOSE, CAPILLARY
Glucose-Capillary: 121 mg/dL — ABNORMAL HIGH (ref 70–99)
Glucose-Capillary: 87 mg/dL (ref 70–99)
Glucose-Capillary: 118 mg/dL — ABNORMAL HIGH (ref 70–99)
Glucose-Capillary: 128 mg/dL — ABNORMAL HIGH (ref 70–99)
Glucose-Capillary: 132 mg/dL — ABNORMAL HIGH (ref 70–99)
Glucose-Capillary: 127 mg/dL — ABNORMAL HIGH (ref 70–99)

## 2023-08-02 LAB — BASIC METABOLIC PANEL
Anion gap: 9 (ref 5–15)
BUN: 41 mg/dL — ABNORMAL HIGH (ref 8–23)
CO2: 37 mmol/L — ABNORMAL HIGH (ref 22–32)
Calcium: 8 mg/dL — ABNORMAL LOW (ref 8.9–10.3)
Chloride: 96 mmol/L — ABNORMAL LOW (ref 98–111)
Creatinine, Ser: 0.71 mg/dL (ref 0.44–1.00)
GFR, Estimated: >60 mL/min (ref >60)
Glucose, Bld: 116 mg/dL — ABNORMAL HIGH (ref 70–99)
Potassium: 4.1 mmol/L (ref 3.5–5.1)
Sodium: 142 mmol/L (ref 135–145)

## 2023-08-02 LAB — MRSA NEXT GEN BY PCR, NASAL: MRSA by PCR Next Gen: NOT DETECTED

## 2023-08-02 LAB — CBC WITH DIFFERENTIAL/PLATELET
Abs Immature Granulocytes: 0.27 10*3/uL — ABNORMAL HIGH (ref 0.00–0.07)
Basophils Absolute: 0.1 10*3/uL (ref 0.0–0.1)
Basophils Relative: 0 %
Eosinophils Absolute: 0 10*3/uL (ref 0.0–0.5)
Eosinophils Relative: 0 %
HCT: 47.6 % — ABNORMAL HIGH (ref 36.0–46.0)
Hemoglobin: 14.9 g/dL (ref 12.0–15.0)
Immature Granulocytes: 1 %
Lymphocytes Relative: 1 %
Lymphs Abs: 0.2 10*3/uL — ABNORMAL LOW (ref 0.7–4.0)
MCH: 29.4 pg (ref 26.0–34.0)
MCHC: 31.3 g/dL (ref 30.0–36.0)
MCV: 94.1 fL (ref 80.0–100.0)
Monocytes Absolute: 0.4 10*3/uL (ref 0.1–1.0)
Monocytes Relative: 2 %
Neutro Abs: 18.8 10*3/uL — ABNORMAL HIGH (ref 1.7–7.7)
Neutrophils Relative %: 96 %
Platelets: 157 10*3/uL (ref 150–400)
RBC: 5.06 MIL/uL (ref 3.87–5.11)
RDW: 16.9 % — ABNORMAL HIGH (ref 11.5–15.5)
WBC: 19.6 10*3/uL — ABNORMAL HIGH (ref 4.0–10.5)
nRBC: 0 % (ref 0.0–0.2)

## 2023-08-02 LAB — PROCALCITONIN: Procalcitonin: <0.1 ng/mL

## 2023-08-02 LAB — MAGNESIUM: Magnesium: 1.8 mg/dL (ref 1.7–2.4)

## 2023-08-02 LAB — DIGOXIN LEVEL: Digoxin Level: 1.4 ng/mL (ref 0.8–2.0)

## 2023-08-02 MED ORDER — TRAZODONE HCL 50 MG PO TABS
50.0000 mg | ORAL_TABLET | Freq: Every evening | ORAL | Status: DC | PRN
  Administered 2023-08-02 – 2023-08-11 (×7): 50 mg via ORAL
  Filled 2023-08-02 (×7): qty 1

## 2023-08-02 MED ORDER — METOPROLOL TARTRATE 5 MG/5ML IV SOLN
5.0000 mg | INTRAVENOUS | Status: DC | PRN
  Administered 2023-08-06: 5 mg via INTRAVENOUS
  Filled 2023-08-02: qty 5

## 2023-08-02 MED ORDER — PREDNISONE 20 MG PO TABS
20.0000 mg | ORAL_TABLET | Freq: Every day | ORAL | Status: AC
  Administered 2023-08-02 – 2023-08-06 (×5): 20 mg via ORAL
  Filled 2023-08-02 (×5): qty 1

## 2023-08-02 MED ORDER — LORAZEPAM 2 MG/ML IJ SOLN
0.5000 mg | Freq: Once | INTRAMUSCULAR | Status: AC
  Administered 2023-08-02: 0.5 mg via INTRAVENOUS
  Filled 2023-08-02: qty 1

## 2023-08-02 MED ORDER — SODIUM CHLORIDE 0.9 % IV SOLN
3.0000 g | Freq: Four times a day (QID) | INTRAVENOUS | Status: DC
  Administered 2023-08-03 – 2023-08-04 (×5): 3 g via INTRAVENOUS
  Filled 2023-08-02 (×4): qty 8

## 2023-08-02 MED ORDER — HYDRALAZINE HCL 20 MG/ML IJ SOLN: 10.0000 mg | INTRAMUSCULAR | Status: DC | PRN

## 2023-08-02 NOTE — Plan of Care (Signed)
  Problem: Coping: Goal: Ability to adjust to condition or change in health will improve Outcome: Progressing   Problem: Fluid Volume: Goal: Ability to maintain a balanced intake and output will improve Outcome: Progressing   Problem: Nutritional: Goal: Maintenance of adequate nutrition will improve Outcome: Progressing Goal: Progress toward achieving an optimal weight will improve Outcome: Progressing   Problem: Tissue Perfusion: Goal: Adequacy of tissue perfusion will improve Outcome: Progressing   Problem: Clinical Measurements: Goal: Ability to maintain clinical measurements within normal limits will improve Outcome: Progressing Goal: Will remain free from infection Outcome: Progressing Goal: Diagnostic test results will improve Outcome: Progressing Goal: Respiratory complications will improve Outcome: Progressing Goal: Cardiovascular complication will be avoided Outcome: Progressing

## 2023-08-02 NOTE — Progress Notes (Signed)
PHARMACY - PHYSICIAN COMMUNICATION CRITICAL VALUE ALERT - BLOOD CULTURE IDENTIFICATION (BCID)  Denise Mckenzie is an 77 y.o. female who presented to [facility] on 08/01/2023 with a chief complaint of HCAP, aspiration, possible pneumonitis.    Assessment:   BCx: 1/3 bottles GPC, 1/3 bottles GPR BCID: Enterococcus faecalis (no resistance detected)  Name of physician (or Provider) Contacted: Dr Nelson Chimes  Current antibiotics: Zosyn  Changes to prescribed antibiotics recommended:  Recommendations declined by provider.  Dr Nelson Chimes wishes to discuss with ID tomorrow before narrowing to Ampicillin.    Results for orders placed or performed during the hospital encounter of 08/01/23  Blood Culture ID Panel (Reflexed) (Collected: 08/01/2023  4:50 PM)  Result Value Ref Range   Enterococcus faecalis DETECTED (A) NOT DETECTED   Enterococcus Faecium NOT DETECTED NOT DETECTED   Listeria monocytogenes NOT DETECTED NOT DETECTED   Staphylococcus species NOT DETECTED NOT DETECTED   Staphylococcus aureus (BCID) NOT DETECTED NOT DETECTED   Staphylococcus epidermidis NOT DETECTED NOT DETECTED   Staphylococcus lugdunensis NOT DETECTED NOT DETECTED   Streptococcus species NOT DETECTED NOT DETECTED   Streptococcus agalactiae NOT DETECTED NOT DETECTED   Streptococcus pneumoniae NOT DETECTED NOT DETECTED   Streptococcus pyogenes NOT DETECTED NOT DETECTED   A.calcoaceticus-baumannii NOT DETECTED NOT DETECTED   Bacteroides fragilis NOT DETECTED NOT DETECTED   Enterobacterales NOT DETECTED NOT DETECTED   Enterobacter cloacae complex NOT DETECTED NOT DETECTED   Escherichia coli NOT DETECTED NOT DETECTED   Klebsiella aerogenes NOT DETECTED NOT DETECTED   Klebsiella oxytoca NOT DETECTED NOT DETECTED   Klebsiella pneumoniae NOT DETECTED NOT DETECTED   Proteus species NOT DETECTED NOT DETECTED   Salmonella species NOT DETECTED NOT DETECTED   Serratia marcescens NOT DETECTED NOT DETECTED   Haemophilus influenzae NOT  DETECTED NOT DETECTED   Neisseria meningitidis NOT DETECTED NOT DETECTED   Pseudomonas aeruginosa NOT DETECTED NOT DETECTED   Stenotrophomonas maltophilia NOT DETECTED NOT DETECTED   Candida albicans NOT DETECTED NOT DETECTED   Candida auris NOT DETECTED NOT DETECTED   Candida glabrata NOT DETECTED NOT DETECTED   Candida krusei NOT DETECTED NOT DETECTED   Candida parapsilosis NOT DETECTED NOT DETECTED   Candida tropicalis NOT DETECTED NOT DETECTED   Cryptococcus neoformans/gattii NOT DETECTED NOT DETECTED   Vancomycin resistance NOT DETECTED NOT DETECTED    Lynann Beaver PharmD, BCPS [facility] main pharmacy [phone] 08/02/2023 6:14 PM

## 2023-08-02 NOTE — Consult Note (Addendum)
Cardiology Consultation   Patient ID: Denise Mckenzie MRN: [id number]; DOB: [birth year]  Admit date: 08/01/2023 Date of Consult: 08/02/2023  PCP:  Burton Apley, MD   [facility] Providers Cardiologist:  Armanda Magic, MD   {  Patient Profile:   Denise Mckenzie is a 77 y.o. female with a hx of new pulmonary hypertension, persistent atrial flutter DCCV 06/2023, CAD (CABG 1996), COPD, HTN, HLD, Breast/lung cancer, hypothyroidism, type 2 diabetes, who is being seen 08/02/2023 for the evaluation of atrial flutter at the request of Dr. Nelson Chimes.  History of Present Illness:   Ms. Pursley recent admission from 07/12/2023 - 07/23/2023 for CHF exacerbation complicated by atrial fibrillation/flutter RVR and new onset RV failure.  Since then she has been seen on 2 other occasions for worsening shortness of breath and peripheral edema.  At her last admission she had an echocardiogram that showed new severe RV dysfunction with evidence of of moderate pulmonary hypertension with a PASP of 45 with severe right atrial enlargement.  CTA did not show PE.  Hospitalization was complicated by atrial flutter and hypotension with eventual successful TEE/DCCV on 07/16/2023 however later converted back to atrial fib/flutter 07/19/2023 and started on amiodarone 400 mg twice daily, recognizing this is not great with her underlying emphysema however limited options given hypotension also requiring midodrine as well as digoxin.  She was discharged on Cardizem 120 mg CD with plans for outpatient follow-up.  Additionally, ideally would have had right heart cath prior admission however with DCCV Eliquis cannot be stopped and therefore barrier to cath.  This was elected to be done outpatient.  Patient has now been readmitted for worsening shortness of breath, new pneumonia, atrial flutter.  Sounds like she never fully recovered outpatient had an episode of tachycardia, palpitations, shortness of breath.  At home she was also  reported to have hypoxia 86% on 3 L home oxygen.  VBG showing pH of 7.37, CO2 80.  WBC 20.5.  CT chest concerning for pneumonia, negative respiratory panel.  Started on antibiotics and initially required BiPAP however this has been weaned down to supplemental oxygen.  Patient reports feeling better with IV diuretics however still short of breath and volume up.  Denies any chest pain.  She is resting in bed at a crooked angle and also has left arm tremor that appears to be new.  She still on supplemental oxygen 3 to 4 L.  Heart rates are decently controlled right now between 80-100.  CTA with concerning signs of pneumonia and emphysema.  Small volume ascites in the abdomen.  BNP 862 down from previous admissions 1300.  Potassium 4.1.  Normal creatinine.  Procalcitonin negative.  Digoxin level 1.4.  Troponin 73-75.     Past Medical History:  Diagnosis Date   Acute exacerbation of CHF (congestive heart failure) (HCC) 07/12/2023   Arthritis    Breast cancer (HCC) dx'd 09/2015   COPD (chronic obstructive pulmonary disease) (HCC)    Coronary artery disease    Depression    Diabetes mellitus (HCC) 10/22/2015   Full dentures    full upper dentures, plate on bottom   History of bronchitis    History of pneumonia    Hyperlipidemia    Hypertension    Hypothyroidism 10/22/2015   lung ca dx;d 09/2015   lung cancer   Myocardial infarction [facility]) July 1995   Osteoporosis 10/22/2015   Personal history of radiation therapy    Stress incontinence    Uterine cancer (HCC) dx'd  1968    Past Surgical History:  Procedure Laterality Date   ABDOMINAL HYSTERECTOMY     1968   APPENDECTOMY     with hysterectomy   BLADDER SURGERY     1972   BREAST BIOPSY Left 11/04/2015   Malignant   BREAST BIOPSY Left 11/04/2015   Malignant   BREAST LUMPECTOMY Left 11/19/2015   CARDIAC CATHETERIZATION  1995, 1996   Dr. 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   LUNG BIOPSY N/A 11/13/2015   Procedure: RIGHT LUNG BIOPSY;  Surgeon: Delight Ovens, MD;  Location: [facility] OR;  Service: Thoracic;  Laterality: N/A;   MULTIPLE TOOTH EXTRACTIONS     RADIOACTIVE SEED GUIDED PARTIAL MASTECTOMY WITH AXILLARY SENTINEL LYMPH NODE BIOPSY Left 11/19/2015   Procedure: RADIOACTIVE SEED GUIDED PARTIAL MASTECTOMY WITH AXILLARY SENTINEL LYMPH NODE BIOPSY;  Surgeon: Manus Rudd, MD;  Location: [facility];  Service: General;  Laterality: Left;   RE-EXCISION OF BREAST LUMPECTOMY Left 12/03/2015   Procedure: RE-EXCISION INFERIOR MARGIN OF LEFT BREAST LUMPECTOMY;  Surgeon: Manus Rudd, MD;  Location: [facility] OR;  Service: General;  Laterality: Left;   TMJ ARTHROPLASTY     1979   TONSILLECTOMY     TRANSESOPHAGEAL ECHOCARDIOGRAM (CATH LAB) N/A 07/16/2023   Procedure: TRANSESOPHAGEAL ECHOCARDIOGRAM;  Surgeon: Sande Rives, MD;  Location: [facility] INVASIVE CV LAB;  Service: Cardiovascular;  Laterality: N/A;   VIDEO BRONCHOSCOPY WITH ENDOBRONCHIAL NAVIGATION N/A 11/13/2015   Procedure: VIDEO BRONCHOSCOPY WITH ENDOBRONCHIAL NAVIGATION, with placement of fudicial markers;  Surgeon: Delight Ovens, MD;  Location: [facility] OR;  Service: Thoracic;  Laterality: N/A;     Inpatient Medications: Scheduled Meds:  amiodarone  400 mg Oral BID   amitriptyline  50 mg Oral QHS   apixaban  5 mg Oral BID   Chlorhexidine Gluconate Cloth  6 each Topical Daily   digoxin  0.125 mg Oral Daily   diltiazem  120 mg Oral Daily   docusate sodium  100 mg Oral BID   insulin aspart  0-15 Units Subcutaneous Q4H   levothyroxine  88 mcg Oral  QAC breakfast   midodrine  5 mg Oral TID WC   mometasone-formoterol  2 puff Inhalation BID   predniSONE  20 mg Oral Q breakfast   torsemide  40 mg Oral BID   Continuous Infusions:  piperacillin-tazobactam (ZOSYN)  IV Stopped (08/02/23 1056)   vancomycin     PRN Meds: acetaminophen **OR** acetaminophen, benzonatate, hydrALAZINE, levalbuterol, metoprolol tartrate, nitroGLYCERIN, ondansetron **OR** ondansetron (ZOFRAN) IV, senna-docusate, traZODone  Allergies:   No Known Allergies  Social History:   Social History   Socioeconomic History   Marital status: Married    Spouse name: Not on file   Number of children: Not on file   Years of education: Not on file   Highest education level: Not on file  Occupational History   Not on file  Tobacco Use   Smoking status: Former    Current packs/day: 0.00    Types: Cigarettes  Quit date: 08/17/1994    Years since quitting: 28.9    Passive exposure: Past   Smokeless tobacco: Never  Vaping Use   Vaping status: Never Used  Substance and Sexual Activity   Alcohol use: No   Drug use: No   Sexual activity: Not on file  Other Topics Concern   Not on file  Social History Narrative   Not on file   Social Drivers of Health   Financial Resource Strain: Not on file  Food Insecurity: No Food Insecurity (08/02/2023)   Hunger Vital Sign    Worried About Running Out of Food in the Last Year: Never true    Ran Out of Food in the Last Year: Never true  Recent Concern: Food Insecurity - Food Insecurity Present (07/13/2023)   Hunger Vital Sign    Worried About Running Out of Food in the Last Year: Never true    Ran Out of Food in the Last Year: Sometimes true  Transportation Needs: No Transportation Needs (08/02/2023)   PRAPARE - Administrator, Civil Service (Medical): No    Lack of Transportation (Non-Medical): No  Physical Activity: Not on file  Stress: Not on file  Social Connections: Not on file  Intimate Partner  Violence: Not At Risk (08/02/2023)   Humiliation, Afraid, Rape, and Kick questionnaire    Fear of Current or Ex-Partner: No    Emotionally Abused: No    Physically Abused: No    Sexually Abused: No    Family History:   Family History  Problem Relation Age of Onset   Alzheimer's disease Mother    Heart attack Father    Breast cancer Neg Hx      ROS:  Please see the history of present illness.  All other ROS reviewed and negative.     Physical Exam/Data:   Vitals:   08/02/23 0900 08/02/23 1000 08/02/23 1100 08/02/23 1200  BP:  107/60  (!) 112/46  Pulse: (!) 122 94  76  Resp: (!) 24 (!) 22  (!) 26  Temp:   (!) 97.5 F (36.4 C)   TempSrc:   Oral   SpO2: (!) 88% 97%  97%    Intake/Output Summary (Last 24 hours) at 08/02/2023 1317 Last data filed at 08/02/2023 1235 Gross per 24 hour  Intake 123.12 ml  Output 950 ml  Net -826.88 ml      07/30/2023   10:48 PM 07/25/2023   10:42 PM 07/22/2023    5:28 AM  Last 3 Weights  Weight (lbs) 153 lb 3.5 oz 153 lb 171 lb 4.8 oz  Weight (kg) 69.5 kg 69.4 kg 77.7 kg     There is no height or weight on file to calculate BMI.  General: Ill-appearing on supplemental oxygen HEENT: normal Neck: JVD Vascular: No carotid bruits; Distal pulses 2+ bilaterally Cardiac: Irregularly irregular Lungs: Diminished breath sounds with crackles Abd: soft, nontender, no hepatomegaly  Ext: 2+ edema with wraps Musculoskeletal:  No deformities, BUE and BLE strength normal and equal Skin: warm and dry  Neuro:  CNs 2-12 intact, no focal abnormalities noted Psych:  Normal affect   EKG:  The EKG was personally reviewed and demonstrates: Atrial flutter, heart rate 92 Telemetry:  Telemetry was personally reviewed and demonstrates: Atrial flutter heart rates between 80-100  Relevant CV Studies: Echocardiogram 07/13/2023 1. Left ventricular ejection fraction, by estimation, is 60 to 65%. The  left ventricle has normal function. The left ventricle has no  regional  wall  motion abnormalities. Left ventricular diastolic parameters are  consistent with Grade I diastolic  dysfunction (impaired relaxation). There is the interventricular septum is  flattened in systole and diastole, consistent with right ventricular  pressure and volume overload.   2. Right ventricular systolic function is severely reduced. The right  ventricular size is moderately enlarged. There is moderately elevated  pulmonary artery systolic pressure. The estimated right ventricular  systolic pressure is 45.9 mmHg.   3. Right atrial size was severely dilated.   4. The mitral valve is normal in structure. Trivial mitral valve  regurgitation.   5. The aortic valve is normal in structure. There is mild calcification  of the aortic valve. Aortic valve regurgitation is not visualized.   6. The inferior vena cava is dilated in size with <50% respiratory  variability, suggesting right atrial pressure of 15 mmHg.   Comparison(s): Changes from prior study are noted. New severe RV dilation  and dysfunction.    Laboratory Data:  High Sensitivity Troponin:   Recent Labs  Lab 07/12/23 2059 07/25/23 2240 07/26/23 0124 08/01/23 1650 08/01/23 1845  TROPONINIHS 57* 44* 47* 73* 75*     Chemistry Recent Labs  Lab 07/30/23 2126 08/01/23 1650 08/01/23 1957 08/02/23 0257  NA 139 141  --  142  K 4.2 3.9  --  4.1  CL 92* 94*  --  96*  CO2 38* 40*  --  37*  GLUCOSE 156* 114*  --  116*  BUN 40* 39*  --  41*  CREATININE 0.99 0.78  --  0.71  CALCIUM 8.4* 7.9*  --  8.0*  MG  --   --  1.7 1.8  GFRNONAA 58* >60  --  >60  ANIONGAP 9 7  --  9    Recent Labs  Lab 08/01/23 1650  PROT 5.0*  ALBUMIN 2.5*  AST 47*  ALT 52*  ALKPHOS 86  BILITOT 0.7   Lipids No results for input(s): "CHOL", "TRIG", "HDL", "LABVLDL", "LDLCALC", "CHOLHDL" in the last 168 hours.  Hematology Recent Labs  Lab 07/30/23 2126 08/01/23 1650 08/02/23 0257  WBC 20.9* 20.5* 19.6*  RBC 5.03 4.98 5.06   HGB 14.6 14.9 14.9  HCT 46.6* 47.5* 47.6*  MCV 92.6 95.4 94.1  MCH 29.0 29.9 29.4  MCHC 31.3 31.4 31.3  RDW 16.3* 16.7* 16.9*  PLT 174 171 157   Thyroid No results for input(s): "TSH", "FREET4" in the last 168 hours.  BNP Recent Labs  Lab 07/30/23 2126 08/01/23 1650  BNP 1,287.7* 862.2*    DDimer No results for input(s): "DDIMER" in the last 168 hours.   Radiology/Studies:  CT Angio Chest PE W and/or Wo Contrast Result Date: 08/01/2023 CLINICAL DATA:  Pulmonary embolism (PE) suspected, low to intermediate prob, neg D-dimer EXAM: CT ANGIOGRAPHY CHEST WITH CONTRAST TECHNIQUE: Multidetector CT imaging of the chest was performed using the standard protocol during bolus administration of intravenous contrast. Multiplanar CT image reconstructions and MIPs were obtained to evaluate the vascular anatomy. RADIATION DOSE REDUCTION: This exam was performed according to the departmental dose-optimization program which includes automated exposure control, adjustment of the mA and/or kV according to patient size and/or use of iterative reconstruction technique. CONTRAST:  75mL OMNIPAQUE IOHEXOL 350 MG/ML SOLN COMPARISON:  07/13/2023 FINDINGS: Cardiovascular: Satisfactory opacification of the pulmonary arteries. No pulmonary arterial filling defects. Pulmonary arteries are mildly dilated. Thoracic aorta is nonaneurysmal. Atherosclerotic calcifications of the aorta and coronary arteries. Prior CABG. Heart size is upper limits of normal. No pericardial effusion.  Mediastinum/Nodes: No enlarged mediastinal, hilar, or axillary lymph nodes. Thyroid gland, trachea, and esophagus demonstrate no significant findings. Lungs/Pleura: Small layering bilateral pleural effusions. Dependent atelectasis or consolidation of the left lower lobe and to a lesser degree within the right lower lobe and lingula. Chronic scarring within the right upper lobe associated with fiducial markers, unchanged. Background of emphysema. No  pneumothorax. Upper Abdomen: Cholelithiasis. Stable bilateral low-attenuation adrenal nodules, previously described as adenomas. These do not require follow-up imaging. Small volume ascites within the upper abdomen. Musculoskeletal: Exaggerated thoracic kyphosis. Prior sternotomy. No new or acute bony findings. No significant chest wall abnormality. Review of the MIP images confirms the above findings. IMPRESSION: 1. No evidence of pulmonary embolism. 2. Small layering bilateral pleural effusions. Dependent atelectasis or consolidation of the left lower lobe and to a lesser degree within the right lower lobe and lingula. 3. Small volume ascites within the upper abdomen. 4. Cholelithiasis. 5. Aortic and coronary artery atherosclerosis (ICD10-I70.0). 6. Emphysema (ICD10-J43.9). Electronically Signed   By: Duanne Guess D.O.   On: 08/01/2023 19:15   CT Head Wo Contrast Result Date: 08/01/2023 CLINICAL DATA:  Mental status change EXAM: CT HEAD WITHOUT CONTRAST TECHNIQUE: Contiguous axial images were obtained from the base of the skull through the vertex without intravenous contrast. RADIATION DOSE REDUCTION: This exam was performed according to the departmental dose-optimization program which includes automated exposure control, adjustment of the mA and/or kV according to patient size and/or use of iterative reconstruction technique. COMPARISON:  CT head 07/12/2023 FINDINGS: Brain: No intracranial hemorrhage, mass effect, or evidence of acute infarct. No hydrocephalus. No extra-axial fluid collection. Age-commensurate cerebral atrophy and chronic small vessel ischemic disease. Vascular: No hyperdense vessel. Intracranial arterial calcification. Skull: No fracture or focal lesion. Sinuses/Orbits: No acute finding. Other: None. IMPRESSION: No acute intracranial abnormality. Electronically Signed   By: Minerva Fester M.D.   On: 08/01/2023 19:12   DG Chest Port 1 View Result Date: 08/01/2023 CLINICAL DATA:   Shortness of breath, wheezing, low oxygen saturations EXAM: PORTABLE CHEST 1 VIEW COMPARISON:  07/30/2023 FINDINGS: Increased size of the cardiac silhouette compared with 07/30/2023. Aortic atherosclerotic calcification. Sternotomy and CABG increased bibasilar airspace and interstitial opacities. Increased opacities about the surgical clips in the right upper lobe. Layering bilateral pleural effusions. No pneumothorax. IMPRESSION: 1. Increased size of the cardiac silhouette compared with 07/30/2023. This may be in part due to patient rotation however is concerning for pericardial effusion. CT is recommended for further evaluation. 2. Increased bibasilar airspace and interstitial opacities. Pulmonary edema is favored though multifocal pneumonia could appear similarly. 3. Layering bilateral pleural effusions. Electronically Signed   By: Minerva Fester M.D.   On: 08/01/2023 17:43   DG Chest Port 1 View Result Date: 07/30/2023 CLINICAL DATA:  Shortness of breath and COPD EXAM: PORTABLE CHEST 1 VIEW COMPARISON:  Chest x-ray 07/25/2023.  Chest CT 07/13/2023. FINDINGS: Focal parenchymal opacities and postsurgical changes in the right upper lobe have not significantly changed. There is a stable small left pleural effusion and left basilar infiltrates. The cardiomediastinal silhouette is unchanged. Sternotomy wires and mediastinal clips are again noted. No acute osseous abnormalities. IMPRESSION: 1. No significant change in focal parenchymal opacities and postsurgical changes in the right upper lobe. 2. Stable small left pleural effusion and left basilar infiltrates. Electronically Signed   By: Darliss Cheney M.D.   On: 07/30/2023 23:17     Assessment and Plan:   Pulmonary hypertension, likely group II/III Hypotension requiring midodrine Echo showed preserved LVEF 60  to 65% with no wall motion abnormalities.  D-shaped septum with severe RV dysfunction.  PASP 46, severe RAE.  Previous CTA negative for PE.  Due to  recent cardioversion right heart cath unable to be performed.  Still appears to be volume up peripherally however reports improved symptoms.  BNP 862, down from 1300 prior admission. Currently on torsemide 40 mg twice daily, midodrine 5 mg 3 times daily.  Could consider escalating diuretics however respiratory complaints seem primarily driven by pneumonia. Likely also needs outpatient sleep study as well as right heart cath eventually once more stable.  Persistent atrial flutter/fib She is very symptomatic.  Had TEE/DCCV on 07/16/2023 with successful conversion to sinus.  She converted back to flutter on 07/19/2023 and started on 400 mg twice daily.  She generally has controlled heart rates between 80-100. Currently on amiodarone 400 mg twice daily, Cardizem 120 mg CD, digoxin 0.125 mg daily (digoxin level 1.4).  Has issues with hypotension requiring midodrine, will discuss with MD if to continue long-acting Cardizem.  Again, amiodarone not ideal with underlying emphysema however limited options. With her underlying conditions, not likely to hold sinus rhythm even if cardioverted.  Will discuss with MD to clarify plan if we would consider DCCV again and load with amio.  Continue Eliquis 5 mg twice daily T4 .97, TSH .321  CAD status post CABG 1996 Stable, no chest pain.  Continue statin  Pneumonia with acute on chronic respiratory failure with hypercapnia COPD Antibiotics per primary team.  Negative respiratory panel.   Risk Assessment/Risk Scores:   New York Heart Association (NYHA) Functional Class NYHA Class IV  CHA2DS2-VASc Score = 7  This indicates a 11.2% annual risk of stroke. The patient's score is based upon: CHF History: 1 HTN History: 1 Diabetes History: 1 Stroke History: 0 Vascular Disease History: 1 Age Score: 2 Gender Score: 1   For questions or updates, please contact [facility] Please consult [email or web address] for contact info under    Signed, Abagail Kitchens, PA-C  08/02/2023 1:17 PM

## 2023-08-02 NOTE — Progress Notes (Signed)
PROGRESS NOTE    Denise Mckenzie  JYN:[id number] DOB: [birth year] DOA: 08/01/2023 PCP: Burton Apley, MD    Brief Narrative:   77 year old female with past medical history of CAD ( h/o CABG 29), COPD, HTN, HLD, hypothyroidism, T2DM, chronic respiratory failure 3L,  h/o breast and lung cancer.  Patient recently admitted 11/25 - 12/6 with anasarca, CHF exacerbation, A-fib with RVR.  Patient treated with diuresis.  She underwent TEE/DCCV for A-fib.  Patient discharged to [facility].  Patient brought back in after she became short of breath, wheezing and altered.  Initially noted to be in hypercapnic respiratory failure requiring BiPAP.  CT suggestive of possible pneumonia and started on vancomycin and cefepime.   Assessment & Plan:  Principal Problem:   Acute on chronic respiratory failure with hypercapnia (HCC) Active Problems:   Coronary artery disease   Hypertension   Hypothyroidism   Primary cancer of right upper lobe of lung (HCC)   COPD (chronic obstructive pulmonary disease) (HCC)   Chronic diastolic (congestive) heart failure (HCC)   PAF (paroxysmal atrial fibrillation) (HCC)   HCAP (healthcare-associated pneumonia)   Acute on chronic hypercapnic respiratory failure Healthcare acquired pneumonia History of COPD -Currently patient is on BiPAP, slowly wean this off.  Given symptomatology, CT findings and elevated WBC patient is currently on Zosyn.  Vancomycin discontinued, MRSA negative.  Bronchodilators, I-S/flutter valve. There is also a component of aspiration, possible pneumonitis, will start Prednisone 20mg  po daily x 5 days. Consult S&S - COVID, flu, RSV negative, urine strep antigen-negative  Diabetes mellitus type 2 -Sliding scale and Accu-Cheks  History of coronary artery disease with history of CABG in 1996 CHF with preserved EF with severe RV failure/dilation - Echo shows EF 65%, elevated PASP, severe RV failure.  Continue home torsemide 40 mg orally  daily. -Home meds Eliquis, statin, Cardizem, Farxiga  Paroxysmal atrial fibrillation status post cardioversion 07/16/2023 -Currently on amiodarone, digoxin, Cardizem.  Continue Eliquis  History of right upper lobe lung cancer History of breast cancer - Both in remission  Hyperlipidemia -Statin  Hypothyroidism -Synthroid   DVT prophylaxis:  apixaban (ELIQUIS) tablet 5 mg   Code Status:  Family Communication:   Status is: Inpatient Remains inpatient appropriate because: Continue hospital stay for management of her respiratory status    Subjective: Seen at bed side, still having some exertional dyspnea.  Off BiPAP this morning on 3 L nasal cannula.   Examination:  General exam: Appears calm and comfortable  Respiratory system: Bibasilar rhonchi Cardiovascular system: S1 & S2 heard, RRR. No JVD, murmurs, rubs, gallops or clicks. No pedal edema. Gastrointestinal system: Abdomen is nondistended, soft and nontender. No organomegaly or masses felt. Normal bowel sounds heard. Central nervous system: Alert and oriented. No focal neurological deficits. Extremities: Symmetric 5 x 5 power. Skin: No rashes, lesions or ulcers Psychiatry: Judgement and insight appear normal. Mood & affect appropriate.                Diet Orders (From admission, onward)     Start     Ordered   08/02/23 1024  Diet heart healthy/carb modified Room service appropriate? Yes; Fluid consistency: Thin; Fluid restriction: 1500 mL Fluid  Diet effective now       Question Answer Comment  Diet-HS Snack? Nothing   Room service appropriate? Yes   Fluid consistency: Thin   Fluid restriction: 1500 mL Fluid      08/02/23 1023            Objective: Vitals:  08/02/23 0900 08/02/23 1000 08/02/23 1100 08/02/23 1200  BP:  107/60  (!) 112/46  Pulse: (!) 122 94  76  Resp: (!) 24 (!) 22  (!) 26  Temp:   (!) 97.5 F (36.4 C)   TempSrc:   Oral   SpO2: (!) 88% 97%  97%    Intake/Output  Summary (Last 24 hours) at 08/02/2023 1325 Last data filed at 08/02/2023 1235 Gross per 24 hour  Intake 123.12 ml  Output 950 ml  Net -826.88 ml   There were no vitals filed for this visit.  Scheduled Meds:  amiodarone  400 mg Oral BID   amitriptyline  50 mg Oral QHS   apixaban  5 mg Oral BID   Chlorhexidine Gluconate Cloth  6 each Topical Daily   digoxin  0.125 mg Oral Daily   diltiazem  120 mg Oral Daily   docusate sodium  100 mg Oral BID   insulin aspart  0-15 Units Subcutaneous Q4H   levothyroxine  88 mcg Oral QAC breakfast   midodrine  5 mg Oral TID WC   mometasone-formoterol  2 puff Inhalation BID   predniSONE  20 mg Oral Q breakfast   torsemide  40 mg Oral BID   Continuous Infusions:  piperacillin-tazobactam (ZOSYN)  IV Stopped (08/02/23 1056)    Nutritional status     There is no height or weight on file to calculate BMI.  Data Reviewed:   CBC: Recent Labs  Lab 07/30/23 2126 08/01/23 1650 08/02/23 0257  WBC 20.9* 20.5* 19.6*  NEUTROABS  --  18.2* 18.8*  HGB 14.6 14.9 14.9  HCT 46.6* 47.5* 47.6*  MCV 92.6 95.4 94.1  PLT 174 171 157   Basic Metabolic Panel: Recent Labs  Lab 07/30/23 2126 08/01/23 1650 08/01/23 1957 08/02/23 0257  NA 139 141  --  142  K 4.2 3.9  --  4.1  CL 92* 94*  --  96*  CO2 38* 40*  --  37*  GLUCOSE 156* 114*  --  116*  BUN 40* 39*  --  41*  CREATININE 0.99 0.78  --  0.71  CALCIUM 8.4* 7.9*  --  8.0*  MG  --   --  1.7 1.8   GFR: Estimated Creatinine Clearance: 52.1 mL/min (by C-G formula based on SCr of 0.71 mg/dL). Liver Function Tests: Recent Labs  Lab 08/01/23 1650  AST 47*  ALT 52*  ALKPHOS 86  BILITOT 0.7  PROT 5.0*  ALBUMIN 2.5*   No results for input(s): "LIPASE", "AMYLASE" in the last 168 hours. No results for input(s): "AMMONIA" in the last 168 hours. Coagulation Profile: No results for input(s): "INR", "PROTIME" in the last 168 hours. Cardiac Enzymes: No results for input(s): "CKTOTAL", "CKMB",  "CKMBINDEX", "TROPONINI" in the last 168 hours. BNP (last 3 results) Recent Labs    02/22/23 1136 07/02/23 1645 07/09/23 1131  PROBNP 5,414* 21,913* 33,547*   HbA1C: No results for input(s): "HGBA1C" in the last 72 hours. CBG: Recent Labs  Lab 08/01/23 1648 08/01/23 2326 08/02/23 0334 08/02/23 0740 08/02/23 1128  GLUCAP 124* 119* 121* 87 118*   Lipid Profile: No results for input(s): "CHOL", "HDL", "LDLCALC", "TRIG", "CHOLHDL", "LDLDIRECT" in the last 72 hours. Thyroid Function Tests: No results for input(s): "TSH", "T4TOTAL", "FREET4", "T3FREE", "THYROIDAB" in the last 72 hours. Anemia Panel: No results for input(s): "VITAMINB12", "FOLATE", "FERRITIN", "TIBC", "IRON", "RETICCTPCT" in the last 72 hours. Sepsis Labs: Recent Labs  Lab 07/30/23 2208 08/01/23 1654 08/01/23 1844 08/02/23 0257  PROCALCITON  --   --   --  <0.10  LATICACIDVEN 1.4 2.4* 2.1*  --     Recent Results (from the past 240 hours)  Resp panel by RT-PCR (RSV, Flu A&B, Covid) Anterior Nasal Swab     Status: None   Collection Time: 07/30/23  9:26 PM   Specimen: Anterior Nasal Swab  Result Value Ref Range Status   SARS Coronavirus 2 by RT PCR NEGATIVE NEGATIVE Final   Influenza A by PCR NEGATIVE NEGATIVE Final   Influenza B by PCR NEGATIVE NEGATIVE Final    Comment: (NOTE) The Xpert Xpress SARS-CoV-2/FLU/RSV plus assay is intended as an aid in the diagnosis of influenza from Nasopharyngeal swab specimens and should not be used as a sole basis for treatment. Nasal washings and aspirates are unacceptable for Xpert Xpress SARS-CoV-2/FLU/RSV testing.  Fact Sheet for Patients: BloggerCourse.com  Fact Sheet for Healthcare Providers: SeriousBroker.it  This test is not yet approved or cleared by the [place] FDA and has been authorized for detection and/or diagnosis of SARS-CoV-2 by FDA under an Emergency Use Authorization (EUA). This EUA will  remain in effect (meaning this test can be used) for the duration of the COVID-19 declaration under Section 564(b)(1) of the Act, 21 U.S.C. section 360bbb-3(b)(1), unless the authorization is terminated or revoked.     Resp Syncytial Virus by PCR NEGATIVE NEGATIVE Final    Comment: (NOTE) Fact Sheet for Patients: BloggerCourse.com  Fact Sheet for Healthcare Providers: SeriousBroker.it  This test is not yet approved or cleared by the [place] FDA and has been authorized for detection and/or diagnosis of SARS-CoV-2 by FDA under an Emergency Use Authorization (EUA). This EUA will remain in effect (meaning this test can be used) for the duration of the COVID-19 declaration under Section 564(b)(1) of the Act, 21 U.S.C. section 360bbb-3(b)(1), unless the authorization is terminated or revoked.  Performed at [facility], [address], [place], [place] [address]   Resp panel by RT-PCR (RSV, Flu A&B, Covid) Anterior Nasal Swab     Status: None   Collection Time: 08/01/23  4:43 PM   Specimen: Anterior Nasal Swab  Result Value Ref Range Status   SARS Coronavirus 2 by RT PCR NEGATIVE NEGATIVE Final    Comment: (NOTE) SARS-CoV-2 target nucleic acids are NOT DETECTED.  The SARS-CoV-2 RNA is generally detectable in upper respiratory specimens during the acute phase of infection. The lowest concentration of SARS-CoV-2 viral copies this assay can detect is 138 copies/mL. A negative result does not preclude SARS-Cov-2 infection and should not be used as the sole basis for treatment or other patient management decisions. A negative result may occur with  improper specimen collection/handling, submission of specimen other than nasopharyngeal swab, presence of viral mutation(s) within the areas targeted by this assay, and inadequate number of viral copies(<138 copies/mL). A negative result must be combined with clinical  observations, patient history, and epidemiological information. The expected result is Negative.  Fact Sheet for Patients:  BloggerCourse.com  Fact Sheet for Healthcare Providers:  SeriousBroker.it  This test is no t yet approved or cleared by the [place] FDA and  has been authorized for detection and/or diagnosis of SARS-CoV-2 by FDA under an Emergency Use Authorization (EUA). This EUA will remain  in effect (meaning this test can be used) for the duration of the COVID-19 declaration under Section 564(b)(1) of the Act, 21 U.S.C.section 360bbb-3(b)(1), unless the authorization is terminated  or revoked sooner.  Influenza A by PCR NEGATIVE NEGATIVE Final   Influenza B by PCR NEGATIVE NEGATIVE Final    Comment: (NOTE) The Xpert Xpress SARS-CoV-2/FLU/RSV plus assay is intended as an aid in the diagnosis of influenza from Nasopharyngeal swab specimens and should not be used as a sole basis for treatment. Nasal washings and aspirates are unacceptable for Xpert Xpress SARS-CoV-2/FLU/RSV testing.  Fact Sheet for Patients: BloggerCourse.com  Fact Sheet for Healthcare Providers: SeriousBroker.it  This test is not yet approved or cleared by the [place] FDA and has been authorized for detection and/or diagnosis of SARS-CoV-2 by FDA under an Emergency Use Authorization (EUA). This EUA will remain in effect (meaning this test can be used) for the duration of the COVID-19 declaration under Section 564(b)(1) of the Act, 21 U.S.C. section 360bbb-3(b)(1), unless the authorization is terminated or revoked.     Resp Syncytial Virus by PCR NEGATIVE NEGATIVE Final    Comment: (NOTE) Fact Sheet for Patients: BloggerCourse.com  Fact Sheet for Healthcare Providers: SeriousBroker.it  This test is not yet approved or cleared by  the [place] FDA and has been authorized for detection and/or diagnosis of SARS-CoV-2 by FDA under an Emergency Use Authorization (EUA). This EUA will remain in effect (meaning this test can be used) for the duration of the COVID-19 declaration under Section 564(b)(1) of the Act, 21 U.S.C. section 360bbb-3(b)(1), unless the authorization is terminated or revoked.  Performed at [facility], [address] [place], [place] [address]   Culture, blood (routine x 2)     Status: None (Preliminary result)   Collection Time: 08/01/23  4:50 PM   Specimen: BLOOD  Result Value Ref Range Status   Specimen Description   Final    BLOOD LEFT ANTECUBITAL Performed at [facility], [address] [place], [place] [address]    Special Requests   Final    BOTTLES DRAWN AEROBIC AND ANAEROBIC Blood Culture results may not be optimal due to an inadequate volume of blood received in culture bottles Performed at [facility], [address], [place], [place] [address]    Culture   Final    NO GROWTH < 24 HOURS Performed at [facility], [address], [place], [place] [address]    Report Status PENDING  Incomplete  Culture, blood (routine x 2)     Status: None (Preliminary result)   Collection Time: 08/01/23  5:09 PM   Specimen: BLOOD  Result Value Ref Range Status   Specimen Description   Final    BLOOD BLOOD RIGHT HAND Performed at [facility], [address], [place], [place] [address]    Special Requests   Final    BOTTLES DRAWN AEROBIC ONLY Blood Culture results may not be optimal due to an inadequate volume of blood received in culture bottles Performed at [facility], [address], [place], [place] [address]    Culture   Final    NO GROWTH < 24 HOURS Performed at [facility], [address], [place], [place] [address]    Report Status PENDING  Incomplete  MRSA Next Gen  by PCR, Nasal     Status: None   Collection Time: 08/01/23  9:15 PM   Specimen: Nasal Mucosa; Nasal Swab  Result Value Ref Range Status   MRSA by PCR Next Gen NOT DETECTED NOT DETECTED Final    Comment: (NOTE) The GeneXpert MRSA Assay (FDA approved for NASAL specimens  only), is one component of a comprehensive MRSA colonization surveillance program. It is not intended to diagnose MRSA infection nor to guide or monitor treatment for MRSA infections. Test performance is not FDA approved in patients less than 78 years old. Performed at [facility], [address] [place], [place] [address]          Radiology Studies: CT Angio Chest PE W and/or Wo Contrast Result Date: 08/01/2023 CLINICAL DATA:  Pulmonary embolism (PE) suspected, low to intermediate prob, neg D-dimer EXAM: CT ANGIOGRAPHY CHEST WITH CONTRAST TECHNIQUE: Multidetector CT imaging of the chest was performed using the standard protocol during bolus administration of intravenous contrast. Multiplanar CT image reconstructions and MIPs were obtained to evaluate the vascular anatomy. RADIATION DOSE REDUCTION: This exam was performed according to the departmental dose-optimization program which includes automated exposure control, adjustment of the mA and/or kV according to patient size and/or use of iterative reconstruction technique. CONTRAST:  75mL OMNIPAQUE IOHEXOL 350 MG/ML SOLN COMPARISON:  07/13/2023 FINDINGS: Cardiovascular: Satisfactory opacification of the pulmonary arteries. No pulmonary arterial filling defects. Pulmonary arteries are mildly dilated. Thoracic aorta is nonaneurysmal. Atherosclerotic calcifications of the aorta and coronary arteries. Prior CABG. Heart size is upper limits of normal. No pericardial effusion. Mediastinum/Nodes: No enlarged mediastinal, hilar, or axillary lymph nodes. Thyroid gland, trachea, and esophagus demonstrate no significant findings. Lungs/Pleura: Small layering bilateral  pleural effusions. Dependent atelectasis or consolidation of the left lower lobe and to a lesser degree within the right lower lobe and lingula. Chronic scarring within the right upper lobe associated with fiducial markers, unchanged. Background of emphysema. No pneumothorax. Upper Abdomen: Cholelithiasis. Stable bilateral low-attenuation adrenal nodules, previously described as adenomas. These do not require follow-up imaging. Small volume ascites within the upper abdomen. Musculoskeletal: Exaggerated thoracic kyphosis. Prior sternotomy. No new or acute bony findings. No significant chest wall abnormality. Review of the MIP images confirms the above findings. IMPRESSION: 1. No evidence of pulmonary embolism. 2. Small layering bilateral pleural effusions. Dependent atelectasis or consolidation of the left lower lobe and to a lesser degree within the right lower lobe and lingula. 3. Small volume ascites within the upper abdomen. 4. Cholelithiasis. 5. Aortic and coronary artery atherosclerosis (ICD10-I70.0). 6. Emphysema (ICD10-J43.9). Electronically Signed   By: Duanne Guess D.O.   On: 08/01/2023 19:15   CT Head Wo Contrast Result Date: 08/01/2023 CLINICAL DATA:  Mental status change EXAM: CT HEAD WITHOUT CONTRAST TECHNIQUE: Contiguous axial images were obtained from the base of the skull through the vertex without intravenous contrast. RADIATION DOSE REDUCTION: This exam was performed according to the departmental dose-optimization program which includes automated exposure control, adjustment of the mA and/or kV according to patient size and/or use of iterative reconstruction technique. COMPARISON:  CT head 07/12/2023 FINDINGS: Brain: No intracranial hemorrhage, mass effect, or evidence of acute infarct. No hydrocephalus. No extra-axial fluid collection. Age-commensurate cerebral atrophy and chronic small vessel ischemic disease. Vascular: No hyperdense vessel. Intracranial arterial calcification. Skull: No  fracture or focal lesion. Sinuses/Orbits: No acute finding. Other: None. IMPRESSION: No acute intracranial abnormality. Electronically Signed   By: Minerva Fester M.D.   On: 08/01/2023 19:12   DG Chest Port 1 View Result Date: 08/01/2023 CLINICAL DATA:  Shortness of breath, wheezing, low oxygen saturations EXAM: PORTABLE CHEST 1 VIEW COMPARISON:  07/30/2023 FINDINGS: Increased size of the cardiac silhouette compared with 07/30/2023. Aortic atherosclerotic calcification. Sternotomy and CABG increased bibasilar airspace and interstitial opacities. Increased opacities about the surgical clips in the right upper lobe.  Layering bilateral pleural effusions. No pneumothorax. IMPRESSION: 1. Increased size of the cardiac silhouette compared with 07/30/2023. This may be in part due to patient rotation however is concerning for pericardial effusion. CT is recommended for further evaluation. 2. Increased bibasilar airspace and interstitial opacities. Pulmonary edema is favored though multifocal pneumonia could appear similarly. 3. Layering bilateral pleural effusions. Electronically Signed   By: Minerva Fester M.D.   On: 08/01/2023 17:43           LOS: 1 day   Time spent= 35 mins    Miguel Rota, MD [facility]  If 7PM-7AM, please contact night-coverage  08/02/2023, 1:25 PM

## 2023-08-02 NOTE — Hospital Course (Addendum)
The patient is a 77 year old female with past medical history of CAD ( h/o CABG 54), COPD, HTN, HLD, hypothyroidism, T2DM, chronic respiratory failure 3L,  h/o breast and lung cancer.  Patient recently admitted 11/25 - 12/6 with anasarca, CHF exacerbation, A-fib with RVR.  Patient treated with diuresis.  She underwent TEE/DCCV for A-fib.  Patient discharged to [facility].  Patient brought back in after she became short of breath, wheezing and altered.  Initially noted to be in hypercapnic respiratory failure requiring BiPAP.  CT suggestive of possible pneumonia and started on vancomycin and cefepime.  Eventually blood cultures growing GPC/Enterococcus faecalis therefore transition to Unasyn per ID recommendations.  Cardiology following the patient as well.  Echocardiogram showed preserved EF without vegetation.  08/05/2023 cardiology has now signed off the case and recommending anticoagulation and continue amiodarone for now.  She is now retaining some urine still we will do an In-N-Out x 3 and then if she fails another time we will place Foley catheter.  Infectious diseases recommending ampicillin IV every 24 hours with an end date of 08/16/2023 and has made recommendations for OPAT.  She continues to have some hypoglycemia so we will hold her insulin regimen.  Unna boots have been applied now  08/06/2023.  Patient is more hypotensive so given low-dose IV fluids and increase midodrine as well as give a dose of albumin.  Antihypertensives including Cardizem and torsemide have been held.  FOBT is positive now after she had a large bowel movement and will add suppositories and discussed with GI who feels no role for colonoscopy.  Given her low blood pressure discussed with pulmonary who also feel that there is no role for pressors at this point.  Will need to continue monitor her blood count carefully  Assessment & Plan:  Principal Problem:   Acute on chronic respiratory failure with hypercapnia  (HCC) Active Problems:   Coronary artery disease   Hypertension   Hypothyroidism   Primary cancer of right upper lobe of lung (HCC)   COPD (chronic obstructive pulmonary disease) (HCC)   Chronic diastolic (congestive) heart failure (HCC)   PAF (paroxysmal atrial fibrillation) (HCC)   HCAP (healthcare-associated pneumonia)   Hypotension   Enterococcus faecalis infection   Bacteremia   Typical atrial flutter (HCC)   Acute on chronic hypercapnic respiratory failure Healthcare acquired pneumonia Enterococcus faecalis bacteremia History of COPD -Weaned off BiPAP.  MRSA swab negative.  Blood cultures growing GPC/Enterococcus faecalis.  Antibiotics per ID.  Echo ef 65%, enlarged RV.  Seen by speech and swallow, recommending regular diet with aspiration precautions. -Placed on empiric prednisone daily for 5 days which is likely caused her leukocytosis to remain elevated; WBC Trend: Recent Labs  Lab 07/30/23 2126 08/01/23 1650 08/02/23 0257 08/03/23 0303 08/04/23 0259 08/05/23 0300 08/06/23 0250  WBC 20.9* 20.5* 19.6* 19.2* 18.0* 21.1* 22.0*  -ID Following recommending outpatient antibiotics with OPAT orders for ampicillin every 24 hours -COVID, flu, RSV negative, urine strep antigen-negative -Continue with Dulera 2 puffs IH twice daily as well as Xopenex 0.63 mg neb every 6 as needed for wheezing and shortness of breath -Given her hypotension will need to monitor carefully -PT OT recommending SNF  Hypotension -In setting of infection; will hold her antihypertensives with Cardizem and torsemide -Give gentle IV fluid hydration and give a dose of albumin -Increase Midodrine from 5 mg 3 times daily to 10 mg 3 times daily -Blood pressure goal with a systolic blood pressure greater than 90 -Monitor for further signs  and symptoms bleeding -Discussed with pulmonary who feels that pressors do not need to be initiated at this point and recommending close monitoring  Diabetes mellitus type  2 -Sliding scale and Accu-Cheks -CBG Trend: Recent Labs  Lab 08/05/23 1933 08/05/23 2313 08/06/23 0410 08/06/23 0749 08/06/23 1149 08/06/23 1618 08/06/23 1932  GLUCAP 132* 156* 112* 110* 113* 111* 148*   History of coronary artery disease with history of CABG in 1996 CHF with preserved EF with severe RV failure/dilation -Echo shows EF 65%, elevated PASP, severe RV failure.  Signs of third spacing but no obvious intravascular volume overload.  Continue home torsemide 40 mg orally daily and Unna boots -BNP Trend:  Recent Labs  Lab 07/12/23 1434 07/13/23 0038 07/25/23 2240 07/30/23 2126 08/01/23 1650 08/03/23 0303  BNP 2,311.4* 2,289.1* 1,292.5* 1,287.7* 862.2* 528.7*  -Home meds Eliquis, statin, Cardizem, Marcelline Deist but will hold her Cardizem for now  -Eliquis is being held and will be changed to heparin drip for now  FOBT positive -Had a drop in her hemoglobin and had red blood smeared in her stool after she was constipated for quite a while -Hgb/Hct Trend: Recent Labs  Lab 08/01/23 1650 08/02/23 0257 08/03/23 0303 08/04/23 0259 08/05/23 0300 08/06/23 0250 08/06/23 1733  HGB 14.9 14.9 13.7 13.3 14.1 13.0 10.9*  HCT 47.5* 47.6* 43.4 43.8 45.3 43.1 34.8*  MCV 95.4 94.1 94.6 95.6 94.2 95.6  --   -Check anemia panel in the a.m. -Continue to monitor for signs and symptoms bleeding; has some GI bleeding noted -Repeat CBC in the a.m. -Discussed with GI Dr. Dulce Sellar who feels that there is no role for colonoscopy given how ill the patient is at this time and recommended that she has active GI bleeding they are recommending a stat CT angio of the abdomen pelvis  Paroxysmal atrial fibrillation status post cardioversion 07/16/2023 -Currently on amiodarone, digoxin, Cardizem (HOLD).  Eliquis was continued but will be changing to heparin drip  -seen by cardiology they have signed off and scheduled for follow-up appointment outpatient setting -Reconsulted cardiology given her  tachycardic rates and they felt that telemetry was repeatedly currently on flutter waves and with her ventricular rates consistently less than 100 bpm so now signed off the case again -Unfortunately given her hypotension we had to hold her Cardizem and torsemide  History of right upper lobe lung cancer History of breast cancer -Both in remission  Acute Urinary Retention -Has been wheelchair-bound basically since a few months ago -Do In and Out cath x 3 and if necessary will place Foley catheter and start her on tamsulosin if able -Foley had to be placed overnight  Hyperlipidemia -Holding Statin given slightly abnormal LFTs  Constipation -Bowel regimen initiated  Hypothyroidism -Continue with Levothyroxine 88 mcg po Daily  Abnormal LFTs, improved  -LFT Trend: Recent Labs  Lab 07/12/23 1500 08/01/23 1650 08/06/23 0250 08/06/23 1955  AST 38 47* 23 19  ALT 33 52* 34 25  -Continue to Monitor and Trend and repeat CMP in the AM  Thrombocytopenia, slowly dropping  -Platelet Count Trend: Recent Labs  Lab 07/30/23 2126 08/01/23 1650 08/02/23 0257 08/03/23 0303 08/04/23 0259 08/05/23 0300 08/06/23 0250  PLT 174 171 157 141* 128* 117* 108*  -Continue to Monitor and Trend and repeat CBC in the AM  Hypoalbuminemia -Patient's Albumin Trend: Recent Labs  Lab 07/12/23 1500 08/01/23 1650 08/06/23 0250 08/06/23 1955  ALBUMIN 3.9 2.5* 2.0* 1.8*  -Continue to Monitor and Trend and repeat CMP in the AM  Overweight  -Complicates overall prognosis and care -Estimated body mass index is 28.02 kg/m as calculated from the following:   Height as of this encounter: 5\' 2"  (1.575 m).   Weight as of this encounter: 69.5 kg.  -Weight Loss and Dietary Counseling given

## 2023-08-02 NOTE — Progress Notes (Signed)
SLP Cancellation Note  Patient Details Name: KOLLEEN DURRETTE MRN: [id number] DOB: 1946-07-01   Cancelled treatment:       Reason Eval/Treat Not Completed: Medical issues which prohibited therapy On BiPAP. SLP will follow for readiness.  Angela Nevin, MA, CCC-SLP Speech Therapy

## 2023-08-02 NOTE — Progress Notes (Signed)
        Date: 08/02/2023  Patient name: Denise Mckenzie  Medical record number: [id number]  Date of birth: Dec 25, 1945   I was alerted by [place] system re E faecalis in blood culture  Based on culture data and GS and clinical picture I do not think patient needs broad spectrum antibiotics in form of zosyn  The E faecalis should be more narrowly targetted though I dont mind also for now covering Anerobes that might be beta lactamase +  Therefore I will narrow to unasyn, check TTE and repeat blood cultures in the am  Formal consult in person tomorrow afternoon after my clinic.    Acey Lav 08/02/2023, 9:36 PM

## 2023-08-02 NOTE — Progress Notes (Signed)
   08/02/23 0318  BiPAP/CPAP/SIPAP  BiPAP/CPAP/SIPAP Pt Type Adult  BiPAP/CPAP/SIPAP V60  Mask Type Full face mask  Mask Size Small  Set Rate 16 breaths/min  Respiratory Rate 17 breaths/min  IPAP 18 cmH20  EPAP 8 cmH2O  FiO2 (%) 45 %  Minute Ventilation 7.5  Leak 0  Peak Inspiratory Pressure (PIP) 19  Tidal Volume (Vt) 415  Patient Home Equipment No  Auto Titrate No  Press High Alarm 35 cmH2O  Press Low Alarm 5 cmH2O  Oxygen Percent 45 %  BiPAP/CPAP /SiPAP Vitals  Pulse Rate 64  Resp (!) 21  BP (!) 113/49  SpO2 95 %  Bilateral Breath Sounds Diminished  MEWS Score/Color  MEWS Score 1  MEWS Score Color Green

## 2023-08-03 ENCOUNTER — Inpatient Hospital Stay (HOSPITAL_COMMUNITY): Payer: Medicare Other

## 2023-08-03 ENCOUNTER — Ambulatory Visit: Payer: Medicare Other | Admitting: Cardiology

## 2023-08-03 DIAGNOSIS — J9622 Acute and chronic respiratory failure with hypercapnia: Secondary | ICD-10-CM | POA: Diagnosis not present

## 2023-08-03 DIAGNOSIS — B952 Enterococcus as the cause of diseases classified elsewhere: Secondary | ICD-10-CM | POA: Diagnosis not present

## 2023-08-03 DIAGNOSIS — I480 Paroxysmal atrial fibrillation: Secondary | ICD-10-CM | POA: Diagnosis not present

## 2023-08-03 DIAGNOSIS — R7881 Bacteremia: Secondary | ICD-10-CM | POA: Diagnosis not present

## 2023-08-03 DIAGNOSIS — J189 Pneumonia, unspecified organism: Secondary | ICD-10-CM | POA: Diagnosis not present

## 2023-08-03 LAB — BASIC METABOLIC PANEL
Anion gap: 8 (ref 5–15)
BUN: 45 mg/dL — ABNORMAL HIGH (ref 8–23)
CO2: 40 mmol/L — ABNORMAL HIGH (ref 22–32)
Calcium: 7.8 mg/dL — ABNORMAL LOW (ref 8.9–10.3)
Chloride: 97 mmol/L — ABNORMAL LOW (ref 98–111)
Creatinine, Ser: 0.75 mg/dL (ref 0.44–1.00)
GFR, Estimated: >60 mL/min (ref >60)
Glucose, Bld: 93 mg/dL (ref 70–99)
Potassium: 3.5 mmol/L (ref 3.5–5.1)
Sodium: 145 mmol/L (ref 135–145)

## 2023-08-03 LAB — MAGNESIUM: Magnesium: 1.9 mg/dL (ref 1.7–2.4)

## 2023-08-03 LAB — GLUCOSE, CAPILLARY
Glucose-Capillary: 87 mg/dL (ref 70–99)
Glucose-Capillary: 101 mg/dL — ABNORMAL HIGH (ref 70–99)
Glucose-Capillary: 164 mg/dL — ABNORMAL HIGH (ref 70–99)
Glucose-Capillary: 122 mg/dL — ABNORMAL HIGH (ref 70–99)
Glucose-Capillary: 153 mg/dL — ABNORMAL HIGH (ref 70–99)

## 2023-08-03 LAB — URINE CULTURE: Culture: <10000 — AB

## 2023-08-03 LAB — ECHOCARDIOGRAM COMPLETE
Height: 62 in
S' Lateral: 2.8 cm
Weight: 2451.52 [oz_av]

## 2023-08-03 LAB — CBC
HCT: 43.4 % (ref 36.0–46.0)
Hemoglobin: 13.7 g/dL (ref 12.0–15.0)
MCH: 29.8 pg (ref 26.0–34.0)
MCHC: 31.6 g/dL (ref 30.0–36.0)
MCV: 94.6 fL (ref 80.0–100.0)
Platelets: 141 10*3/uL — ABNORMAL LOW (ref 150–400)
RBC: 4.59 MIL/uL (ref 3.87–5.11)
RDW: 16.7 % — ABNORMAL HIGH (ref 11.5–15.5)
WBC: 19.2 10*3/uL — ABNORMAL HIGH (ref 4.0–10.5)
nRBC: 0 % (ref 0.0–0.2)

## 2023-08-03 LAB — PHOSPHORUS: Phosphorus: 2.7 mg/dL (ref 2.5–4.6)

## 2023-08-03 LAB — BRAIN NATRIURETIC PEPTIDE: B Natriuretic Peptide: 528.7 pg/mL — ABNORMAL HIGH (ref 0.0–100.0)

## 2023-08-03 MED ORDER — POTASSIUM CHLORIDE CRYS ER 20 MEQ PO TBCR
40.0000 meq | EXTENDED_RELEASE_TABLET | Freq: Once | ORAL | Status: AC
  Administered 2023-08-03: 40 meq via ORAL
  Filled 2023-08-03: qty 2

## 2023-08-03 MED ORDER — AMIODARONE HCL 200 MG PO TABS
200.0000 mg | ORAL_TABLET | Freq: Every day | ORAL | Status: DC
  Administered 2023-08-04 – 2023-08-11 (×8): 200 mg via ORAL
  Filled 2023-08-03 (×9): qty 1

## 2023-08-03 NOTE — Progress Notes (Signed)
BiPAP not indicated at this time. Patient work of breathing and respiratory rate within normal limits.

## 2023-08-03 NOTE — Evaluation (Signed)
Clinical/Bedside Swallow Evaluation Patient Details  Name: Denise Mckenzie MRN: [id number] Date of Birth: 1946/03/06  Today's Date: 08/03/2023 Time: SLP Start Time (ACUTE ONLY): 0845 SLP Stop Time (ACUTE ONLY): 0900 SLP Time Calculation (min) (ACUTE ONLY): 15 min  Past Medical History:  Past Medical History:  Diagnosis Date   Acute exacerbation of CHF (congestive heart failure) (HCC) 07/12/2023   Arthritis    Breast cancer (HCC) dx'd 09/2015   COPD (chronic obstructive pulmonary disease) (HCC)    Coronary artery disease    Depression    Diabetes mellitus (HCC) 10/22/2015   Full dentures    full upper dentures, plate on bottom   History of bronchitis    History of pneumonia    Hyperlipidemia    Hypertension    Hypothyroidism 10/22/2015   lung ca dx;d 09/2015   lung cancer   Myocardial infarction [facility]) July 1995   Osteoporosis 10/22/2015   Personal history of radiation therapy    Stress incontinence    Uterine cancer (HCC) dx'd 1968   Past Surgical History:  Past Surgical History:  Procedure Laterality Date   ABDOMINAL HYSTERECTOMY     1968   APPENDECTOMY     with hysterectomy   BLADDER SURGERY     1972   BREAST BIOPSY Left 11/04/2015   Malignant   BREAST BIOPSY Left 11/04/2015   Malignant   BREAST LUMPECTOMY Left 11/19/2015   CARDIAC CATHETERIZATION  1995, 1996   Dr. 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   LUNG BIOPSY N/A 11/13/2015   Procedure: RIGHT LUNG BIOPSY;  Surgeon: Delight Ovens, MD;  Location: [facility] OR;  Service: Thoracic;  Laterality: N/A;    MULTIPLE TOOTH EXTRACTIONS     RADIOACTIVE SEED GUIDED PARTIAL MASTECTOMY WITH AXILLARY SENTINEL LYMPH NODE BIOPSY Left 11/19/2015   Procedure: RADIOACTIVE SEED GUIDED PARTIAL MASTECTOMY WITH AXILLARY SENTINEL LYMPH NODE BIOPSY;  Surgeon: Manus Rudd, MD;  Location: [facility];  Service: General;  Laterality: Left;   RE-EXCISION OF BREAST LUMPECTOMY Left 12/03/2015   Procedure: RE-EXCISION INFERIOR MARGIN OF LEFT BREAST LUMPECTOMY;  Surgeon: Manus Rudd, MD;  Location: [facility] OR;  Service: General;  Laterality: Left;   TMJ ARTHROPLASTY     1979   TONSILLECTOMY     TRANSESOPHAGEAL ECHOCARDIOGRAM (CATH LAB) N/A 07/16/2023   Procedure: TRANSESOPHAGEAL ECHOCARDIOGRAM;  Surgeon: Sande Rives, MD;  Location: [facility] INVASIVE CV LAB;  Service: Cardiovascular;  Laterality: N/A;   VIDEO BRONCHOSCOPY WITH ENDOBRONCHIAL NAVIGATION N/A 11/13/2015   Procedure: VIDEO BRONCHOSCOPY WITH ENDOBRONCHIAL NAVIGATION, with placement of fudicial markers;  Surgeon: Delight Ovens, MD;  Location: [facility] OR;  Service: Thoracic;  Laterality: N/A;   HPI:  Patient is a 77 y.o. female with PMH: CAD, h/o CABG 1996, COPD, HTN, HLD, hypothyroidism, DM-2, chronic respiratory failure on 3L, h/o breast and lung cancer. She was recently admitted 11/25-12/6/24 with anasarca, CHF exacerbation, a-fib with RVR. She was discharged to [facility]. She presented to hospital on 12/15 with SOB, wheezing and altered. In  ER, she was  hypoxic at 86% on home 3L oxygen, HR 150. CT chest suggestive of PNA. She was placed on BiPAP. SLP swallow evaluation ordered secondary to patient c/o difficult with swallowing pills and RN observing patient to have instances of coughing when drinking liquids.    Assessment / Plan / Recommendation  Clinical Impression  Patient is presenting with clinical s/s of dysphagia as per this bedside swallow evaluation. SLP assessed her swallow as she fed herself breakfast meal of scrambled eggs, toast  with jelly and coffee. She did not exhibit immediate coughing with PO intake, however after consuming several bites of eggs, she had an onset of coughing which sounded congested. She did not expectorate any PO's or secretions but did report that she has been having productive coughing of secretions previously. Patient continued to eat after coughing subsided and no further overt s/s aspiration observed. SLP recommending to proceed with modified  barium swallow study to r/o aspiration. SLP Visit Diagnosis: Dysphagia, unspecified (R13.10)    Aspiration Risk  Moderate aspiration risk    Diet Recommendation Other (Comment) (continue current diet with caution, PO recs to come after MBS)    Medication Administration: Whole meds with puree Supervision: Patient able to self feed;Staff to assist with self feeding Compensations: Slow rate;Small sips/bites Postural Changes: Seated upright at 90 degrees    Other  Recommendations Oral Care Recommendations: Oral care BID    Recommendations for follow up therapy are one component of a multi-disciplinary discharge planning process, led by the attending physician.  Recommendations may be updated based on patient status, additional functional criteria and insurance authorization.  Follow up Recommendations Other (comment) (pending MBS results)      Assistance Recommended at Discharge    Functional Status Assessment Patient has had a recent decline in their functional status and demonstrates the ability to make significant improvements in function in a reasonable and predictable amount of time.  Frequency and Duration min 1 x/week  1 week       Prognosis   TBD     Swallow Study   General Date of Onset: 08/02/23 HPI: Patient is a 77 y.o. female with PMH: CAD, h/o CABG 1996, COPD, HTN, HLD, hypothyroidism, DM-2, chronic respiratory failure on 3L, h/o breast and lung cancer. She was recently admitted 11/25-12/6/24 with anasarca, CHF exacerbation, a-fib  with RVR. She was discharged to [name]. She presented to hospital on 12/15 with SOB, wheezing and altered. In ER, she was  hypoxic at 86% on home 3L oxygen, HR 150. CT chest suggestive of PNA. She was placed on BiPAP. SLP swallow evaluation ordered secondary to patient c/o difficult with swallowing pills and RN observing patient to have instances of coughing when drinking liquids. Previous Swallow Assessment: none found Diet Prior to this Study: Regular;Thin liquids (Level 0) Temperature Spikes Noted: No Respiratory Status: Nasal cannula History of Recent Intubation: No Behavior/Cognition: Alert;Cooperative;Pleasant mood Oral Cavity Assessment: Within Functional Limits Oral Care Completed by SLP: No Oral Cavity - Dentition: Adequate natural dentition Self-Feeding Abilities: Able to feed self;Needs set up Patient Positioning: Upright in bed Baseline Vocal Quality: Normal;Hoarse Volitional Cough: Congested Volitional Swallow: Able to elicit    Oral/Motor/Sensory Function Overall Oral Motor/Sensory Function: Within functional limits   Ice Chips     Thin Liquid Thin Liquid: Impaired Presentation: Self Fed;Cup Pharyngeal  Phase Impairments: Cough - Delayed    Nectar Thick     Honey Thick     Puree Puree: Not tested   Solid  Solid: Impaired Oral Phase Functional Implications: Oral residue Pharyngeal Phase Impairments: Cough - Delayed     Angela Nevin, MA, CCC-SLP Speech Therapy

## 2023-08-03 NOTE — Progress Notes (Signed)
Patient Name: Denise Mckenzie Date of Encounter: 08/03/2023 [facility] Cardiologist: Armanda Magic, MD   Interval Summary  .    She seems more lethargic today, patient states this is because she just woke up though.  She states that she feels better today and less pain.  No shortness of breath or chest pain.  Unfortunately blood cultures grew E faecalis.  Infectious disease to consult in the a.m.  Vital Signs .    Vitals:   08/03/23 0400 08/03/23 0500 08/03/23 0600 08/03/23 0700  BP: (!) 103/44 (!) 109/41 (!) 108/38 (!) 116/50  Pulse: (!) 58 (!) 58 (!) 57 (!) 59  Resp: 13 16 12 13   Temp:      TempSrc:      SpO2: 99% 99% 99% 99%  Weight:      Height:        Intake/Output Summary (Last 24 hours) at 08/03/2023 0740 Last data filed at 08/03/2023 0600 Gross per 24 hour  Intake 816.78 ml  Output 1700 ml  Net -883.22 ml      08/02/2023    6:39 PM 07/30/2023   10:48 PM 07/25/2023   10:42 PM  Last 3 Weights  Weight (lbs) 153 lb 3.5 oz 153 lb 3.5 oz 153 lb  Weight (kg) 69.5 kg 69.5 kg 69.4 kg      Telemetry/ECG    Atrial flutter heart rates in the 60s to 80s- Personally Reviewed  CV Studies    Echocardiogram 07/13/2023 1. Left ventricular ejection fraction, by estimation, is 60 to 65%. The  left ventricle has normal function. The left ventricle has no regional  wall motion abnormalities. Left ventricular diastolic parameters are  consistent with Grade I diastolic  dysfunction (impaired relaxation). There is the interventricular septum is  flattened in systole and diastole, consistent with right ventricular  pressure and volume overload.   2. Right ventricular systolic function is severely reduced. The right  ventricular size is moderately enlarged. There is moderately elevated  pulmonary artery systolic pressure. The estimated right ventricular  systolic pressure is 45.9 mmHg.   3. Right atrial size was severely dilated.   4. The mitral valve is normal in  structure. Trivial mitral valve  regurgitation.   5. The aortic valve is normal in structure. There is mild calcification  of the aortic valve. Aortic valve regurgitation is not visualized.   6. The inferior vena cava is dilated in size with <50% respiratory  variability, suggesting right atrial pressure of 15 mmHg.   Comparison(s): Changes from prior study are noted. New severe RV dilation  and dysfunction.     Physical Exam .   GEN: No acute distress.  Lethargic, ill-appearing on supplemental oxygen 2 L Neck: No JVD Cardiac: Irregularly irregular  Respiratory: Poor respiratory effort, no crackles today GI: Soft, nontender, non-distended  MS: 2+ edema with wraps   Patient Profile    Denise Mckenzie is a 77 y.o. female has hx of  new pulmonary hypertension, persistent atrial flutter DCCV 06/2023, CAD (CABG 1996), COPD, HTN, HLD, Breast/lung cancer, hypothyroidism, type 2 diabetes  and admitted on 08/01/2023 for the evaluation of pneumonia.  Cardiology asked to see for atrial flutter with worsening shortness of breath  Assessment & Plan .     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  D-shaped septum with severe RV dysfunction.  PASP 46, severe RAE.  Previous CTA negative for  PE.  Due to recent cardioversion right heart cath unable to be performed.  Volume status appears to be okay, holds fluid peripherally.  She reports no significant shortness of breath.  BNP is downtrending around 500 today down from 1300 prior admission.   Currently on torsemide 40 mg twice daily, midodrine 5 mg 3 times daily.   Likely also needs outpatient sleep study as well as right heart cath eventually once more stable.   Persistent atrial flutter/fib She is very symptomatic.  Had TEE/DCCV on 07/16/2023 with successful conversion to sinus.  She converted back to flutter on 07/19/2023 and started on amio 400  mg twice daily.    Still maintaining atrial flutter with controlled ventricular rates 60-80 today Currently on amiodarone 400 mg twice daily, Cardizem 120 mg CD, digoxin 0.125 mg daily (digoxin level 1.4).  Again, amiodarone not ideal with underlying emphysema however limited options. Plan is to continue with amiodarone load and repeat DCCV.  This was started on 07/19/2023.  Defer to MD dosing taper or if to maintain 400 mg twice daily while inpatient Continue Eliquis 5 mg twice daily T4 .97, TSH .321  Pneumonia with acute on chronic respiratory failure with hypercapnia COPD Blood cultures grew E faecalis, infectious disease to consult today.  Echocardiogram pending.  She has been changed to Unasyn.  Seems more sluggish today however Co2 levels looks stable.    CAD status post CABG 1996 Stable, no chest pain.  Continue statin      For questions or updates, please contact [facility] Please consult [email or web address] for contact info under        Signed, Abagail Kitchens, PA-C

## 2023-08-03 NOTE — Progress Notes (Signed)
PT Cancellation Note  Patient Details Name: Denise Mckenzie MRN: [id number] DOB: April 27, 1946   Cancelled Treatment:    Reason Eval/Treat Not Completed: Other (comment) Pt reports willing to work with therapy but wants to wait till tomorrow.  States busy day with echo and barium swallow.  Nurse had confirmed that pt likely fatigued and reports that she is very weak with 3 recent falls (prior to admission).  Will f/u tomorrowl  Laqueshia Cihlar H Deeanne Deininger 08/03/2023, 3:30 PM

## 2023-08-03 NOTE — Consult Note (Signed)
Date of Admission:  08/01/2023          Reason for Consult: Enterococcus faecalis bacteremia    Referring Provider: CHAMP [employer] consult and Stephania Fragmin, MD   Assessment:  Enterococcus faecalis bacteremia   Acute on respiratory failure with hypercapnia COPD Paroxysmal atrial fibrillation Coronary artery disease  Plan:  Narrowed to unasyn Follow-up blood cultures from admission and repeat blood cultures I do not think she would appear at face value a good candidate for TEE (see discussion below)  Principal Problem:   Acute on chronic respiratory failure with hypercapnia (HCC) Active Problems:   Coronary artery disease   Hypertension   Hypothyroidism   Primary cancer of right upper lobe of lung (HCC)   COPD (chronic obstructive pulmonary disease) (HCC)   Chronic diastolic (congestive) heart failure (HCC)   PAF (paroxysmal atrial fibrillation) (HCC)   HCAP (healthcare-associated pneumonia)   Hypotension   Scheduled Meds:  [START ON 08/04/2023] amiodarone  200 mg Oral Daily   amitriptyline  50 mg Oral QHS   apixaban  5 mg Oral BID   Chlorhexidine Gluconate Cloth  6 each Topical Daily   digoxin  0.125 mg Oral Daily   diltiazem  120 mg Oral Daily   docusate sodium  100 mg Oral BID   insulin aspart  0-15 Units Subcutaneous Q4H   levothyroxine  88 mcg Oral QAC breakfast   midodrine  5 mg Oral TID WC   mometasone-formoterol  2 puff Inhalation BID   predniSONE  20 mg Oral Q breakfast   torsemide  40 mg Oral BID   Continuous Infusions:  ampicillin-sulbactam (UNASYN) IV 3 g (08/03/23 1711)   PRN Meds:.acetaminophen **OR** acetaminophen, benzonatate, hydrALAZINE, levalbuterol, metoprolol tartrate, nitroGLYCERIN, ondansetron **OR** ondansetron (ZOFRAN) IV, senna-docusate, traZODone  HPI: Denise Mckenzie is a 77 y.o. female with meedical history of CAD ( h/o CABG 18), COPD, HTN, HLD, hypothyroidism, T2DM, chronic respiratory failure 3L, h/o breast and lung cancer.  Patient recently admitted 11/25 - 12/6 with anasarca, CHF exacerbation, A-fib with RVR. Patient treated with diuresis. She underwent TEE/DCCV for A-fib. Patient discharged to [facility]. Patient brought back in after she became short of breath, wheezing and altered. Initially noted to be in hypercapnic respiratory failure requiring BiPAP. CT suggestive of possible pneumonia and started on vancomycin and cefepime.  I am not terribly impressed with the CT scan which shows some pleural effusions and some atelectasis versus consolidation of left lower lobe and right lower lobe and lingula.    Blood cultures taken on admission growing E faecalis in 1/2 sites.  GS had shown some GPR as well  Narrowed her to Unasyn then she has had repeat transthoracic echocardiogram.  Repeat blood cultures have been taken.  He does not appear to be a person who would be a good candidate for transesophageal echocardiogram.  If her second blood culture also was positive for Enterococcus faecalis I would probably be inclined to err on the side of treating her empirically for infectious endocarditis.  I have personally spent 84 minutes involved in face-to-face and non-face-to-face activities for this patient on the day of the visit. Professional time spent includes the following activities: Preparing to see the patient (review of tests), Obtaining and/or reviewing separately obtained history (admission/discharge record), Performing a medically appropriate examination and/or evaluation , Ordering medications/tests/procedures, referring and communicating with other health care professionals, Documenting clinical information in the EMR, Independently interpreting results (not separately reported), Communicating results to  the patient/family/caregiver, Counseling and educating the patient/family/caregiver and Care coordination (not separately reported).  \   Review of Systems: Review of Systems  Constitutional:   Positive for chills. Negative for diaphoresis, fever, malaise/fatigue and weight loss.  HENT:  Negative for congestion, hearing loss, sore throat and tinnitus.   Eyes:  Negative for blurred vision and double vision.  Respiratory:  Positive for cough and shortness of breath. Negative for sputum production and wheezing.   Cardiovascular:  Negative for chest pain, palpitations and leg swelling.  Gastrointestinal:  Negative for abdominal pain, blood in stool, constipation, diarrhea, heartburn, melena, nausea and vomiting.  Genitourinary:  Negative for dysuria, flank pain and hematuria.  Musculoskeletal:  Negative for back pain, falls, joint pain and myalgias.  Skin:  Negative for itching and rash.  Neurological:  Positive for weakness. Negative for dizziness, sensory change, focal weakness, loss of consciousness and headaches.  Endo/Heme/Allergies:  Does not bruise/bleed easily.  Psychiatric/Behavioral:  Negative for depression, memory loss and suicidal ideas. The patient is not nervous/anxious.     Past Medical History:  Diagnosis Date   Acute exacerbation of CHF (congestive heart failure) (HCC) 07/12/2023   Arthritis    Breast cancer (HCC) dx'd 09/2015   COPD (chronic obstructive pulmonary disease) (HCC)    Coronary artery disease    Depression    Diabetes mellitus (HCC) 10/22/2015   Full dentures    full upper dentures, plate on bottom   History of bronchitis    History of pneumonia    Hyperlipidemia    Hypertension    Hypothyroidism 10/22/2015   lung ca dx;d 09/2015   lung cancer   Myocardial infarction [facility]) July 1995   Osteoporosis 10/22/2015   Personal history of radiation therapy    Stress incontinence    Uterine cancer (HCC) dx'd 1968    Social History   Tobacco Use   Smoking status: Former    Current packs/day: 0.00    Types: Cigarettes    Quit date: 08/17/1994    Years since quitting: 28.9    Passive exposure: Past   Smokeless tobacco: Never  Vaping Use   Vaping status:  Never Used  Substance Use Topics   Alcohol use: No   Drug use: No    Family History  Problem Relation Age of Onset   Alzheimer's disease Mother    Heart attack Father    Breast cancer Neg Hx    No Known Allergies  OBJECTIVE: Blood pressure (!) 133/48, pulse 78, temperature (!) 97.3 F (36.3 C), temperature source Oral, resp. rate (!) 25, height 5\' 2"  (1.575 m), weight 69.5 kg, SpO2 94%.  Physical Exam Constitutional:      General: She is not in acute distress.    Appearance: Normal appearance. She is well-developed. She is obese. She is not ill-appearing or diaphoretic.  HENT:     Head: Normocephalic and atraumatic.     Right Ear: Hearing and external ear normal.     Left Ear: Hearing and external ear normal.     Nose: No nasal deformity or rhinorrhea.  Eyes:     General: No scleral icterus.    Conjunctiva/sclera: Conjunctivae normal.     Right eye: Right conjunctiva is not injected.     Left eye: Left conjunctiva is not injected.     Pupils: Pupils are equal, round, and reactive to light.  Neck:     Vascular: No JVD.  Cardiovascular:     Rate and Rhythm: Bradycardia present. Rhythm irregular.  Heart sounds: Normal heart sounds, S1 normal and S2 normal. No murmur heard.    No friction rub. No gallop.  Pulmonary:     Breath sounds: No stridor. No wheezing or rhonchi.  Abdominal:     Palpations: Abdomen is soft.  Musculoskeletal:        General: Normal range of motion.     Right shoulder: Normal.     Left shoulder: Normal.     Cervical back: Normal range of motion and neck supple.     Right hip: Normal.     Left hip: Normal.     Right knee: Normal.     Left knee: Normal.  Lymphadenopathy:     Head:     Right side of head: No submandibular, preauricular or posterior auricular adenopathy.     Left side of head: No submandibular, preauricular or posterior auricular adenopathy.     Cervical: No cervical adenopathy.     Right cervical: No superficial or deep  cervical adenopathy.    Left cervical: No superficial or deep cervical adenopathy.  Skin:    General: Skin is warm and dry.     Coloration: Skin is not pale.     Findings: No abrasion, bruising, ecchymosis, erythema, lesion or rash.     Nails: There is no clubbing.  Neurological:     General: No focal deficit present.     Mental Status: She is alert and oriented to person, place, and time.     Sensory: No sensory deficit.     Coordination: Coordination normal.     Gait: Gait normal.  Psychiatric:        Attention and Perception: She is attentive.        Mood and Affect: Mood normal.        Speech: Speech normal.        Behavior: Behavior normal. Behavior is cooperative.        Thought Content: Thought content normal.        Judgment: Judgment normal.     Lab Results Lab Results  Component Value Date   WBC 19.2 (H) 08/03/2023   HGB 13.7 08/03/2023   HCT 43.4 08/03/2023   MCV 94.6 08/03/2023   PLT 141 (L) 08/03/2023    Lab Results  Component Value Date   CREATININE 0.75 08/03/2023   BUN 45 (H) 08/03/2023   NA 145 08/03/2023   K 3.5 08/03/2023   CL 97 (L) 08/03/2023   CO2 40 (H) 08/03/2023    Lab Results  Component Value Date   ALT 52 (H) 08/01/2023   AST 47 (H) 08/01/2023   ALKPHOS 86 08/01/2023   BILITOT 0.7 08/01/2023     Microbiology: Recent Results (from the past 240 hours)  Resp panel by RT-PCR (RSV, Flu A&B, Covid) Anterior Nasal Swab     Status: None   Collection Time: 07/30/23  9:26 PM   Specimen: Anterior Nasal Swab  Result Value Ref Range Status   SARS Coronavirus 2 by RT PCR NEGATIVE NEGATIVE Final   Influenza A by PCR NEGATIVE NEGATIVE Final   Influenza B by PCR NEGATIVE NEGATIVE Final    Comment: (NOTE) The Xpert Xpress SARS-CoV-2/FLU/RSV plus assay is intended as an aid in the diagnosis of influenza from Nasopharyngeal swab specimens and should not be used as a sole basis for treatment. Nasal washings and aspirates are unacceptable for Xpert  Xpress SARS-CoV-2/FLU/RSV testing.  Fact Sheet for Patients: BloggerCourse.com  Fact Sheet for Healthcare Providers: SeriousBroker.it  This  test is not yet approved or cleared by the [place] and has been authorized for detection and/or diagnosis of SARS-CoV-2 by FDA under an Emergency Use Authorization (EUA). This EUA will remain in effect (meaning this test can be used) for the duration of the COVID-19 declaration under Section 564(b)(1) of the Act, 21 U.S.C. section 360bbb-3(b)(1), unless the authorization is terminated or revoked.     Resp Syncytial Virus by PCR NEGATIVE NEGATIVE Final    Comment: (NOTE) Fact Sheet for Patients: BloggerCourse.com  Fact Sheet for Healthcare Providers: SeriousBroker.it  This test is not yet approved or cleared by the [place] FDA and has been authorized for detection and/or diagnosis of SARS-CoV-2 by FDA under an Emergency Use Authorization (EUA). This EUA will remain in effect (meaning this test can be used) for the duration of the COVID-19 declaration under Section 564(b)(1) of the Act, 21 U.S.C. section 360bbb-3(b)(1), unless the authorization is terminated or revoked.  Performed at [facility], [address], [place], [place] [address]   Resp panel by RT-PCR (RSV, Flu A&B, Covid) Anterior Nasal Swab     Status: None   Collection Time: 08/01/23  4:43 PM   Specimen: Anterior Nasal Swab  Result Value Ref Range Status   SARS Coronavirus 2 by RT PCR NEGATIVE NEGATIVE Final    Comment: (NOTE) SARS-CoV-2 target nucleic acids are NOT DETECTED.  The SARS-CoV-2 RNA is generally detectable in upper respiratory specimens during the acute phase of infection. The lowest concentration of SARS-CoV-2 viral copies this assay can detect is 138 copies/mL. A negative result does not preclude SARS-Cov-2 infection and should not be  used as the sole basis for treatment or other patient management decisions. A negative result may occur with  improper specimen collection/handling, submission of specimen other than nasopharyngeal swab, presence of viral mutation(s) within the areas targeted by this assay, and inadequate number of viral copies(<138 copies/mL). A negative result must be combined with clinical observations, patient history, and epidemiological information. The expected result is Negative.  Fact Sheet for Patients:  BloggerCourse.com  Fact Sheet for Healthcare Providers:  SeriousBroker.it  This test is no t yet approved or cleared by the [place] FDA and  has been authorized for detection and/or diagnosis of SARS-CoV-2 by FDA under an Emergency Use Authorization (EUA). This EUA will remain  in effect (meaning this test can be used) for the duration of the COVID-19 declaration under Section 564(b)(1) of the Act, 21 U.S.C.section 360bbb-3(b)(1), unless the authorization is terminated  or revoked sooner.       Influenza A by PCR NEGATIVE NEGATIVE Final   Influenza B by PCR NEGATIVE NEGATIVE Final    Comment: (NOTE) The Xpert Xpress SARS-CoV-2/FLU/RSV plus assay is intended as an aid in the diagnosis of influenza from Nasopharyngeal swab specimens and should not be used as a sole basis for treatment. Nasal washings and aspirates are unacceptable for Xpert Xpress SARS-CoV-2/FLU/RSV testing.  Fact Sheet for Patients: BloggerCourse.com  Fact Sheet for Healthcare Providers: SeriousBroker.it  This test is not yet approved or cleared by the [place] FDA and has been authorized for detection and/or diagnosis of SARS-CoV-2 by FDA under an Emergency Use Authorization (EUA). This EUA will remain in effect (meaning this test can be used) for the duration of the COVID-19 declaration under Section  564(b)(1) of the Act, 21 U.S.C. section 360bbb-3(b)(1), unless the authorization is terminated or revoked.     Resp Syncytial Virus by PCR NEGATIVE NEGATIVE Final  Comment: (NOTE) Fact Sheet for Patients: BloggerCourse.com  Fact Sheet for Healthcare Providers: SeriousBroker.it  This test is not yet approved or cleared by the [place] FDA and has been authorized for detection and/or diagnosis of SARS-CoV-2 by FDA under an Emergency Use Authorization (EUA). This EUA will remain in effect (meaning this test can be used) for the duration of the COVID-19 declaration under Section 564(b)(1) of the Act, 21 U.S.C. section 360bbb-3(b)(1), unless the authorization is terminated or revoked.  Performed at [facility], [address], [place], [place] [address]   Culture, blood (routine x 2)     Status: Abnormal (Preliminary result)   Collection Time: 08/01/23  4:50 PM   Specimen: BLOOD  Result Value Ref Range Status   Specimen Description   Final    BLOOD LEFT ANTECUBITAL Performed at [facility], [address], [place], [place] [address]    Special Requests   Final    BOTTLES DRAWN AEROBIC AND ANAEROBIC Blood Culture results may not be optimal due to an inadequate volume of blood received in culture bottles Performed at [facility], [address] [place], [place] [address]    Culture  Setup Time   Final    GRAM POSITIVE COCCI ANAEROBIC BOTTLE ONLY CRITICAL RESULT CALLED TO, READ BACK BY AND VERIFIED WITH: PHARMD CHRISTINE SHADE ON 08/02/23 @ 1814 BY DRT GRAM POSITIVE RODS AEROBIC BOTTLE ONLY CRITICAL RESULT CALLED TO, READ BACK BY AND VERIFIED WITH: PHARMD CHRISTINE SHADE ON 08/02/23 @ 1817 BY DRT    Culture (A)  Final    ENTEROCOCCUS FAECALIS SUSCEPTIBILITIES TO FOLLOW CULTURE REINCUBATED FOR BETTER GROWTH Performed at [facility], [address],  [place], [place] [address]    Report Status PENDING  Incomplete  Blood Culture ID Panel (Reflexed)     Status: Abnormal   Collection Time: 08/01/23  4:50 PM  Result Value Ref Range Status   Enterococcus faecalis DETECTED (A) NOT DETECTED Final    Comment: CRITICAL RESULT CALLED TO, READ BACK BY AND VERIFIED WITH: PHARMD CHRISTINE SHADE ON 08/02/23 @ 1814 BY DRT    Enterococcus Faecium NOT DETECTED NOT DETECTED Final   Listeria monocytogenes NOT DETECTED NOT DETECTED Final   Staphylococcus species NOT DETECTED NOT DETECTED Final   Staphylococcus aureus (BCID) NOT DETECTED NOT DETECTED Final   Staphylococcus epidermidis NOT DETECTED NOT DETECTED Final   Staphylococcus lugdunensis NOT DETECTED NOT DETECTED Final   Streptococcus species NOT DETECTED NOT DETECTED Final   Streptococcus agalactiae NOT DETECTED NOT DETECTED Final   Streptococcus pneumoniae NOT DETECTED NOT DETECTED Final   Streptococcus pyogenes NOT DETECTED NOT DETECTED Final   A.calcoaceticus-baumannii NOT DETECTED NOT DETECTED Final   Bacteroides fragilis NOT DETECTED NOT DETECTED Final   Enterobacterales NOT DETECTED NOT DETECTED Final   Enterobacter cloacae complex NOT DETECTED NOT DETECTED Final   Escherichia coli NOT DETECTED NOT DETECTED Final   Klebsiella aerogenes NOT DETECTED NOT DETECTED Final   Klebsiella oxytoca NOT DETECTED NOT DETECTED Final   Klebsiella pneumoniae NOT DETECTED NOT DETECTED Final   Proteus species NOT DETECTED NOT DETECTED Final   Salmonella species NOT DETECTED NOT DETECTED Final   Serratia marcescens NOT DETECTED NOT DETECTED Final   Haemophilus influenzae NOT DETECTED NOT DETECTED Final   Neisseria meningitidis NOT DETECTED NOT DETECTED Final   Pseudomonas aeruginosa NOT DETECTED NOT DETECTED Final   Stenotrophomonas maltophilia NOT DETECTED NOT DETECTED Final   Candida albicans NOT DETECTED NOT DETECTED Final  Candida auris NOT DETECTED NOT DETECTED Final   Candida glabrata NOT DETECTED  NOT DETECTED Final   Candida krusei NOT DETECTED NOT DETECTED Final   Candida parapsilosis NOT DETECTED NOT DETECTED Final   Candida tropicalis NOT DETECTED NOT DETECTED Final   Cryptococcus neoformans/gattii NOT DETECTED NOT DETECTED Final   Vancomycin resistance NOT DETECTED NOT DETECTED Final    Comment: Performed at [facility], [address] [place], [place] [address]  Urine Culture     Status: Abnormal   Collection Time: 08/01/23  5:02 PM   Specimen: Urine, Random  Result Value Ref Range Status   Specimen Description   Final    URINE, RANDOM Performed at [facility], [address], [place], [place] [address]    Special Requests   Final    NONE Reflexed from [id number] Performed at [facility], [address] [place], [place] [address]    Culture (A)  Final    <10,000 COLONIES/mL INSIGNIFICANT GROWTH Performed at [facility], [address], [place], [place] [address]    Report Status 08/03/2023 FINAL  Final  Culture, blood (routine x 2)     Status: None (Preliminary result)   Collection Time: 08/01/23  5:09 PM   Specimen: BLOOD  Result Value Ref Range Status   Specimen Description   Final    BLOOD BLOOD RIGHT HAND Performed at [facility], [address] [place], [place] [address]    Special Requests   Final    BOTTLES DRAWN AEROBIC ONLY Blood Culture results may not be optimal due to an inadequate volume of blood received in culture bottles Performed at [facility] [address], [place], [place] [address]    Culture   Final    NO GROWTH 2 DAYS Performed at [facility], [address], [place], [place] [address]    Report Status PENDING  Incomplete  MRSA Next Gen by PCR, Nasal     Status: None   Collection Time: 08/01/23  9:15 PM   Specimen: Nasal Mucosa; Nasal Swab  Result Value Ref Range Status   MRSA by PCR Next Gen NOT DETECTED NOT DETECTED Final    Comment:  (NOTE) The GeneXpert MRSA Assay (FDA approved for NASAL specimens only), is one component of a comprehensive MRSA colonization surveillance program. It is not intended to diagnose MRSA infection nor to guide or monitor treatment for MRSA infections. Test performance is not FDA approved in patients less than 76 years old. Performed at [facility], [address] [place], [place] [address]   Culture, blood (Routine X 2) w Reflex to ID Panel     Status: None (Preliminary result)   Collection Time: 08/03/23  5:49 AM   Specimen: BLOOD RIGHT ARM  Result Value Ref Range Status   Specimen Description   Final    BLOOD RIGHT ARM Performed at [facility], [address] [place], [place] [address]    Special Requests   Final    BOTTLES DRAWN AEROBIC AND ANAEROBIC Blood Culture results may not be optimal due to an inadequate volume of blood received in culture bottles Performed at [facility], [address], [place], [place] [address]    Culture PENDING  Incomplete   Report Status PENDING  Incomplete    Acey Lav, MD [facility] for Infectious Disease [facility] [phone] pager  08/03/2023, 5:17  PM

## 2023-08-03 NOTE — TOC Progression Note (Signed)
Transition of Care [facility]) - Progression Note    Patient Details  Name: Denise Mckenzie MRN: [id number] Date of Birth: 1946/06/13  Transition of Care [facility]) CM/SW Contact  Geni Bers, RN Phone Number: 08/03/2023, 2:00 PM  Clinical Narrative:     Pt from [facility]. TOC will continue to follow.   Expected Discharge Plan: Skilled Nursing Facility Barriers to Discharge: No Barriers Identified  Expected Discharge Plan and Services     Post Acute Care Choice: Skilled Nursing Facility Living arrangements for the past 2 months: Skilled Nursing Facility                                       Social Determinants of Health (SDOH) Interventions SDOH Screenings   Food Insecurity: No Food Insecurity (08/02/2023)  Recent Concern: Food Insecurity - Food Insecurity Present (07/13/2023)  Housing: Low Risk  (08/02/2023)  Transportation Needs: No Transportation Needs (08/02/2023)  Utilities: Not At Risk (08/02/2023)  Tobacco Use: Medium Risk (08/01/2023)    Readmission Risk Interventions     No data to display

## 2023-08-03 NOTE — Procedures (Signed)
Modified Barium Swallow Study  Patient Details  Name: PUSHPA WIGGERS MRN: [id number] Date of Birth: 05-Jul-1946  Today's Date: 08/03/2023  Modified Barium Swallow completed.  Full report located under Chart Review in the Imaging Section.  History of Present Illness Patient is a 77 y.o. female with PMH: CAD, h/o CABG 1996, COPD, HTN, HLD, hypothyroidism, DM-2, chronic respiratory failure on 3L, h/o breast and lung cancer. She was recently admitted 11/25-12/6/24 with anasarca, CHF exacerbation, a-fib with RVR. She was discharged to [facility]. She presented to hospital on 12/15 with SOB, wheezing and altered. In ER, she was  hypoxic at 86% on home 3L oxygen, HR 150. CT chest suggestive of PNA. She was placed on BiPAP. SLP swallow evaluation ordered secondary to patient c/o difficult with swallowing pills and RN observing patient to have instances of coughing when drinking liquids.   Clinical Impression Patient presents with a mild oral and pharyngoesophageal dysphagia as per this modified barium swallow study. During oral phase, she exhibited slow mastication and slow lingual movement, leading to delayed bolus transit from anterior to posterior portion of oral cavity. Swallow was initiated at level of vallecular sinus with puree, mechanical soft solids and honey thick liquids, and at posterior laryngeal surface of the epiglottis and at times delayed to pyriform sinus with thin and nectar thick liquids. Anterior hyoid excursion, laryngeal elevation and epiglottic inversion all appeared complete. No significant amount of pharyngeal residuals remained after swallows and no instances of penetration or aspiration observed at any phase of the swallow. When taken with thin liquids, barium tablet become briefly lodged in vallecular sinus but transited with more sips of liquids and bites of puree. When camera panned down to view esophagus, barium tablet observed to be at upper thoracic level of esophagus.  Sips of liquids and bites of puree helped to fully transit pill. No barium stasis observed with other barium consistencies but SLP questions reduced esophageal motility. (no radiologist present to confirm) SLP not recommending further skilled intervention but recommends patient take breaks while eating, alternate solids and liquids and avoid rapid PO consumption. RN notified of results and recommendations. Factors that may increase risk of adverse event in presence of aspiration [employer] 2021): Limited mobility;Poor general health and/or compromised immunity;Frail or deconditioned;Respiratory or GI disease  Swallow Evaluation Recommendations Recommendations: PO diet PO Diet Recommendation: Regular;Thin liquids (Level 0) Liquid Administration via: Straw;Cup Medication Administration: Whole meds with liquid Supervision: Patient able to self-feed;Set-up assistance for safety Swallowing strategies  : Minimize environmental distractions;Slow rate Postural changes: Stay upright 30-60 min after meals;Position pt fully upright for meals Oral care recommendations: Oral care BID (2x/day)      Angela Nevin, MA, CCC-SLP Speech Therapy

## 2023-08-03 NOTE — Progress Notes (Signed)
PROGRESS NOTE    Denise Mckenzie  ZOX:[id number] DOB: [birth year] DOA: 08/01/2023 PCP: Burton Apley, MD    Brief Narrative:   77 year old female with past medical history of CAD ( h/o CABG 79), COPD, HTN, HLD, hypothyroidism, T2DM, chronic respiratory failure 3L,  h/o breast and lung cancer.  Patient recently admitted 11/25 - 12/6 with anasarca, CHF exacerbation, A-fib with RVR.  Patient treated with diuresis.  She underwent TEE/DCCV for A-fib.  Patient discharged to [facility].  Patient brought back in after she became short of breath, wheezing and altered.  Initially noted to be in hypercapnic respiratory failure requiring BiPAP.  CT suggestive of possible pneumonia and started on vancomycin and cefepime.  Eventually blood cultures growing GPC/Enterococcus faecalis therefore transition to Unasyn per ID recommendations.  Cardiology following the patient as well.   Assessment & Plan:  Principal Problem:   Acute on chronic respiratory failure with hypercapnia (HCC) Active Problems:   Coronary artery disease   Hypertension   Hypothyroidism   Primary cancer of right upper lobe of lung (HCC)   COPD (chronic obstructive pulmonary disease) (HCC)   Chronic diastolic (congestive) heart failure (HCC)   PAF (paroxysmal atrial fibrillation) (HCC)   HCAP (healthcare-associated pneumonia)   Acute on chronic hypercapnic respiratory failure Healthcare acquired pneumonia Enterococcus faecalis bacteremia History of COPD - Weaned off BiPAP.  MRSA swab negative.  Blood cultures growing GPC/Enterococcus faecalis.  Antibiotics narrowed to Unasyn per ID recommendations.  Will order echocardiogram -Pending speech and swallow evaluation - Empiric prednisone daily for 5 days - ID to see the patient. - COVID, flu, RSV negative, urine strep antigen-negative  Diabetes mellitus type 2 -Sliding scale and Accu-Cheks  History of coronary artery disease with history of CABG in 1996 CHF with preserved EF  with severe RV failure/dilation - Echo shows EF 65%, elevated PASP, severe RV failure.  Signs of third spacing but no obvious intravascular volume overload.  Continue home torsemide 40 mg orally daily. -Home meds Eliquis, statin, Cardizem, Farxiga  Paroxysmal atrial fibrillation status post cardioversion 07/16/2023 -Currently on amiodarone, digoxin, Cardizem.  Continue Eliquis.  Considering to repeat cardioversion once loaded with amiodarone per cardiology.  History of right upper lobe lung cancer History of breast cancer - Both in remission  Hyperlipidemia -Statin  Hypothyroidism -Synthroid   DVT prophylaxis: apixaban (ELIQUIS) tablet 5 mg   Code Status: Full code Family Communication: Son Status is: Inpatient Remains inpatient appropriate because: Continue hospital stay for management of her respiratory status    Subjective: Doing ok no complaints.  Eating her lunch RN at bedside as well.  Examination:  General exam: Appears calm and comfortable  Respiratory system: Bibasilar rhonchi Cardiovascular system: S1 & S2 heard, RRR. No JVD, murmurs, rubs, gallops or clicks. No pedal edema. Gastrointestinal system: Abdomen is nondistended, soft and nontender. No organomegaly or masses felt. Normal bowel sounds heard. Central nervous system: Alert and oriented. No focal neurological deficits. Extremities: Symmetric 5 x 5 power. Skin: No rashes, lesions or ulcers Psychiatry: Judgement and insight appear normal. Mood & affect appropriate.                Diet Orders (From admission, onward)     Start     Ordered   08/02/23 1348  Diet heart healthy/carb modified Room service appropriate? Yes with Assist; Fluid consistency: Thin; Fluid restriction: 1500 mL Fluid  Diet effective now       Question Answer Comment  Diet-HS Snack? Nothing   Room service appropriate?  Yes with Assist   Fluid consistency: Thin   Fluid restriction: 1500 mL Fluid      08/02/23 1347             Objective: Vitals:   08/03/23 0900 08/03/23 1100 08/03/23 1159 08/03/23 1300  BP: 102/64 130/63 (!) 142/70 (!) 133/48  Pulse: (!) 114 (!) 57 86 (!) 38  Resp: (!) 23 (!) 22 19 (!) 22  Temp:   98.3 F (36.8 C)   TempSrc:   Axillary   SpO2: (S) (!) 87% 100% 99% 94%  Weight:      Height:        Intake/Output Summary (Last 24 hours) at 08/03/2023 1332 Last data filed at 08/03/2023 0930 Gross per 24 hour  Intake 569.66 ml  Output 1200 ml  Net -630.34 ml   Filed Weights   08/02/23 1839  Weight: 69.5 kg    Scheduled Meds:  [START ON 08/04/2023] amiodarone  200 mg Oral Daily   amitriptyline  50 mg Oral QHS   apixaban  5 mg Oral BID   Chlorhexidine Gluconate Cloth  6 each Topical Daily   digoxin  0.125 mg Oral Daily   diltiazem  120 mg Oral Daily   docusate sodium  100 mg Oral BID   insulin aspart  0-15 Units Subcutaneous Q4H   levothyroxine  88 mcg Oral QAC breakfast   midodrine  5 mg Oral TID WC   mometasone-formoterol  2 puff Inhalation BID   predniSONE  20 mg Oral Q breakfast   torsemide  40 mg Oral BID   Continuous Infusions:  ampicillin-sulbactam (UNASYN) IV Stopped (08/03/23 1248)    Nutritional status     Body mass index is 28.02 kg/m.  Data Reviewed:   CBC: Recent Labs  Lab 07/30/23 2126 08/01/23 1650 08/02/23 0257 08/03/23 0303  WBC 20.9* 20.5* 19.6* 19.2*  NEUTROABS  --  18.2* 18.8*  --   HGB 14.6 14.9 14.9 13.7  HCT 46.6* 47.5* 47.6* 43.4  MCV 92.6 95.4 94.1 94.6  PLT 174 171 157 141*   Basic Metabolic Panel: Recent Labs  Lab 07/30/23 2126 08/01/23 1650 08/01/23 1957 08/02/23 0257 08/03/23 0303  NA 139 141  --  142 145  K 4.2 3.9  --  4.1 3.5  CL 92* 94*  --  96* 97*  CO2 38* 40*  --  37* 40*  GLUCOSE 156* 114*  --  116* 93  BUN 40* 39*  --  41* 45*  CREATININE 0.99 0.78  --  0.71 0.75  CALCIUM 8.4* 7.9*  --  8.0* 7.8*  MG  --   --  1.7 1.8 1.9  PHOS  --   --   --   --  2.7   GFR: Estimated Creatinine Clearance: 52.1  mL/min (by C-G formula based on SCr of 0.75 mg/dL). Liver Function Tests: Recent Labs  Lab 08/01/23 1650  AST 47*  ALT 52*  ALKPHOS 86  BILITOT 0.7  PROT 5.0*  ALBUMIN 2.5*   No results for input(s): "LIPASE", "AMYLASE" in the last 168 hours. No results for input(s): "AMMONIA" in the last 168 hours. Coagulation Profile: No results for input(s): "INR", "PROTIME" in the last 168 hours. Cardiac Enzymes: No results for input(s): "CKTOTAL", "CKMB", "CKMBINDEX", "TROPONINI" in the last 168 hours. BNP (last 3 results) Recent Labs    02/22/23 1136 07/02/23 1645 07/09/23 1131  PROBNP 5,414* 21,913* 33,547*   HbA1C: No results for input(s): "HGBA1C" in the last 72 hours.  CBG: Recent Labs  Lab 08/02/23 2015 08/02/23 2316 08/03/23 0329 08/03/23 0727 08/03/23 1202  GLUCAP 132* 127* 87 101* 164*   Lipid Profile: No results for input(s): "CHOL", "HDL", "LDLCALC", "TRIG", "CHOLHDL", "LDLDIRECT" in the last 72 hours. Thyroid Function Tests: No results for input(s): "TSH", "T4TOTAL", "FREET4", "T3FREE", "THYROIDAB" in the last 72 hours. Anemia Panel: No results for input(s): "VITAMINB12", "FOLATE", "FERRITIN", "TIBC", "IRON", "RETICCTPCT" in the last 72 hours. Sepsis Labs: Recent Labs  Lab 07/30/23 2208 08/01/23 1654 08/01/23 1844 08/02/23 0257  PROCALCITON  --   --   --  <0.10  LATICACIDVEN 1.4 2.4* 2.1*  --     Recent Results (from the past 240 hours)  Resp panel by RT-PCR (RSV, Flu A&B, Covid) Anterior Nasal Swab     Status: None   Collection Time: 07/30/23  9:26 PM   Specimen: Anterior Nasal Swab  Result Value Ref Range Status   SARS Coronavirus 2 by RT PCR NEGATIVE NEGATIVE Final   Influenza A by PCR NEGATIVE NEGATIVE Final   Influenza B by PCR NEGATIVE NEGATIVE Final    Comment: (NOTE) The Xpert Xpress SARS-CoV-2/FLU/RSV plus assay is intended as an aid in the diagnosis of influenza from Nasopharyngeal swab specimens and should not be used as a sole basis for  treatment. Nasal washings and aspirates are unacceptable for Xpert Xpress SARS-CoV-2/FLU/RSV testing.  Fact Sheet for Patients: BloggerCourse.com  Fact Sheet for Healthcare Providers: SeriousBroker.it  This test is not yet approved or cleared by the [place] FDA and has been authorized for detection and/or diagnosis of SARS-CoV-2 by FDA under an Emergency Use Authorization (EUA). This EUA will remain in effect (meaning this test can be used) for the duration of the COVID-19 declaration under Section 564(b)(1) of the Act, 21 U.S.C. section 360bbb-3(b)(1), unless the authorization is terminated or revoked.     Resp Syncytial Virus by PCR NEGATIVE NEGATIVE Final    Comment: (NOTE) Fact Sheet for Patients: BloggerCourse.com  Fact Sheet for Healthcare Providers: SeriousBroker.it  This test is not yet approved or cleared by the [place] FDA and has been authorized for detection and/or diagnosis of SARS-CoV-2 by FDA under an Emergency Use Authorization (EUA). This EUA will remain in effect (meaning this test can be used) for the duration of the COVID-19 declaration under Section 564(b)(1) of the Act, 21 U.S.C. section 360bbb-3(b)(1), unless the authorization is terminated or revoked.  Performed at [facility], [address], [place], [place] [address]   Resp panel by RT-PCR (RSV, Flu A&B, Covid) Anterior Nasal Swab     Status: None   Collection Time: 08/01/23  4:43 PM   Specimen: Anterior Nasal Swab  Result Value Ref Range Status   SARS Coronavirus 2 by RT PCR NEGATIVE NEGATIVE Final    Comment: (NOTE) SARS-CoV-2 target nucleic acids are NOT DETECTED.  The SARS-CoV-2 RNA is generally detectable in upper respiratory specimens during the acute phase of infection. The lowest concentration of SARS-CoV-2 viral copies this assay can detect is 138 copies/mL. A  negative result does not preclude SARS-Cov-2 infection and should not be used as the sole basis for treatment or other patient management decisions. A negative result may occur with  improper specimen collection/handling, submission of specimen other than nasopharyngeal swab, presence of viral mutation(s) within the areas targeted by this assay, and inadequate number of viral copies(<138 copies/mL). A negative result must be combined with clinical observations, patient history, and epidemiological information. The expected result is Negative.  Fact  Sheet for Patients:  BloggerCourse.com  Fact Sheet for Healthcare Providers:  SeriousBroker.it  This test is no t yet approved or cleared by the [place] FDA and  has been authorized for detection and/or diagnosis of SARS-CoV-2 by FDA under an Emergency Use Authorization (EUA). This EUA will remain  in effect (meaning this test can be used) for the duration of the COVID-19 declaration under Section 564(b)(1) of the Act, 21 U.S.C.section 360bbb-3(b)(1), unless the authorization is terminated  or revoked sooner.       Influenza A by PCR NEGATIVE NEGATIVE Final   Influenza B by PCR NEGATIVE NEGATIVE Final    Comment: (NOTE) The Xpert Xpress SARS-CoV-2/FLU/RSV plus assay is intended as an aid in the diagnosis of influenza from Nasopharyngeal swab specimens and should not be used as a sole basis for treatment. Nasal washings and aspirates are unacceptable for Xpert Xpress SARS-CoV-2/FLU/RSV testing.  Fact Sheet for Patients: BloggerCourse.com  Fact Sheet for Healthcare Providers: SeriousBroker.it  This test is not yet approved or cleared by the [place] FDA and has been authorized for detection and/or diagnosis of SARS-CoV-2 by FDA under an Emergency Use Authorization (EUA). This EUA will remain in effect (meaning this test can  be used) for the duration of the COVID-19 declaration under Section 564(b)(1) of the Act, 21 U.S.C. section 360bbb-3(b)(1), unless the authorization is terminated or revoked.     Resp Syncytial Virus by PCR NEGATIVE NEGATIVE Final    Comment: (NOTE) Fact Sheet for Patients: BloggerCourse.com  Fact Sheet for Healthcare Providers: SeriousBroker.it  This test is not yet approved or cleared by the [place] FDA and has been authorized for detection and/or diagnosis of SARS-CoV-2 by FDA under an Emergency Use Authorization (EUA). This EUA will remain in effect (meaning this test can be used) for the duration of the COVID-19 declaration under Section 564(b)(1) of the Act, 21 U.S.C. section 360bbb-3(b)(1), unless the authorization is terminated or revoked.  Performed at [facility], [address] [place], [place] [address]   Culture, blood (routine x 2)     Status: Abnormal (Preliminary result)   Collection Time: 08/01/23  4:50 PM   Specimen: BLOOD  Result Value Ref Range Status   Specimen Description   Final    BLOOD LEFT ANTECUBITAL Performed at [facility], [address] [place], [place] [address]    Special Requests   Final    BOTTLES DRAWN AEROBIC AND ANAEROBIC Blood Culture results may not be optimal due to an inadequate volume of blood received in culture bottles Performed at [facility], [address] [place], [place] [address]    Culture  Setup Time   Final    GRAM POSITIVE COCCI ANAEROBIC BOTTLE ONLY CRITICAL RESULT CALLED TO, READ BACK BY AND VERIFIED WITH: PHARMD CHRISTINE SHADE ON 08/02/23 @ 1814 BY DRT GRAM POSITIVE RODS AEROBIC BOTTLE ONLY CRITICAL RESULT CALLED TO, READ BACK BY AND VERIFIED WITH: PHARMD CHRISTINE SHADE ON 08/02/23 @ 1817 BY DRT    Culture (A)  Final    ENTEROCOCCUS FAECALIS SUSCEPTIBILITIES TO FOLLOW CULTURE REINCUBATED FOR  BETTER GROWTH Performed at [facility], [address] [place], [place] [address]    Report Status PENDING  Incomplete  Blood Culture ID Panel (Reflexed)     Status: Abnormal   Collection Time: 08/01/23  4:50 PM  Result Value Ref Range Status   Enterococcus faecalis DETECTED (A) NOT DETECTED Final    Comment: CRITICAL RESULT CALLED  TO, READ BACK BY AND VERIFIED WITH: PHARMD CHRISTINE SHADE ON 08/02/23 @ 1814 BY DRT    Enterococcus Faecium NOT DETECTED NOT DETECTED Final   Listeria monocytogenes NOT DETECTED NOT DETECTED Final   Staphylococcus species NOT DETECTED NOT DETECTED Final   Staphylococcus aureus (BCID) NOT DETECTED NOT DETECTED Final   Staphylococcus epidermidis NOT DETECTED NOT DETECTED Final   Staphylococcus lugdunensis NOT DETECTED NOT DETECTED Final   Streptococcus species NOT DETECTED NOT DETECTED Final   Streptococcus agalactiae NOT DETECTED NOT DETECTED Final   Streptococcus pneumoniae NOT DETECTED NOT DETECTED Final   Streptococcus pyogenes NOT DETECTED NOT DETECTED Final   A.calcoaceticus-baumannii NOT DETECTED NOT DETECTED Final   Bacteroides fragilis NOT DETECTED NOT DETECTED Final   Enterobacterales NOT DETECTED NOT DETECTED Final   Enterobacter cloacae complex NOT DETECTED NOT DETECTED Final   Escherichia coli NOT DETECTED NOT DETECTED Final   Klebsiella aerogenes NOT DETECTED NOT DETECTED Final   Klebsiella oxytoca NOT DETECTED NOT DETECTED Final   Klebsiella pneumoniae NOT DETECTED NOT DETECTED Final   Proteus species NOT DETECTED NOT DETECTED Final   Salmonella species NOT DETECTED NOT DETECTED Final   Serratia marcescens NOT DETECTED NOT DETECTED Final   Haemophilus influenzae NOT DETECTED NOT DETECTED Final   Neisseria meningitidis NOT DETECTED NOT DETECTED Final   Pseudomonas aeruginosa NOT DETECTED NOT DETECTED Final   Stenotrophomonas maltophilia NOT DETECTED NOT DETECTED Final   Candida albicans NOT DETECTED NOT DETECTED Final   Candida  auris NOT DETECTED NOT DETECTED Final   Candida glabrata NOT DETECTED NOT DETECTED Final   Candida krusei NOT DETECTED NOT DETECTED Final   Candida parapsilosis NOT DETECTED NOT DETECTED Final   Candida tropicalis NOT DETECTED NOT DETECTED Final   Cryptococcus neoformans/gattii NOT DETECTED NOT DETECTED Final   Vancomycin resistance NOT DETECTED NOT DETECTED Final    Comment: Performed at [facility], [address] [place], [place] [address]  Urine Culture     Status: Abnormal   Collection Time: 08/01/23  5:02 PM   Specimen: Urine, Random  Result Value Ref Range Status   Specimen Description   Final    URINE, RANDOM Performed at [facility], [address] [place], [place] [address]    Special Requests   Final    NONE Reflexed from [id number] Performed at [facility], [address], [place], [place] [address]    Culture (A)  Final    <10,000 COLONIES/mL INSIGNIFICANT GROWTH Performed at [facility], [address] [place], [place] [address]    Report Status 08/03/2023 FINAL  Final  Culture, blood (routine x 2)     Status: None (Preliminary result)   Collection Time: 08/01/23  5:09 PM   Specimen: BLOOD  Result Value Ref Range Status   Specimen Description   Final    BLOOD BLOOD RIGHT HAND Performed at [facility], [address] [place], [place] [address]    Special Requests   Final    BOTTLES DRAWN AEROBIC ONLY Blood Culture results may not be optimal due to an inadequate volume of blood received in culture bottles Performed at [facility], [address] [place], [place] [address]    Culture   Final    NO GROWTH 2 DAYS Performed at [facility], [address], [place], [place] [address]    Report Status PENDING  Incomplete  MRSA Next Gen by PCR, Nasal  Status: None   Collection Time: 08/01/23  9:15 PM   Specimen: Nasal Mucosa; Nasal Swab  Result Value Ref Range  Status   MRSA by PCR Next Gen NOT DETECTED NOT DETECTED Final    Comment: (NOTE) The GeneXpert MRSA Assay (FDA approved for NASAL specimens only), is one component of a comprehensive MRSA colonization surveillance program. It is not intended to diagnose MRSA infection nor to guide or monitor treatment for MRSA infections. Test performance is not FDA approved in patients less than 68 years old. Performed at [facility], [address], [place], [place] [address]   Culture, blood (Routine X 2) w Reflex to ID Panel     Status: None (Preliminary result)   Collection Time: 08/03/23  5:49 AM   Specimen: BLOOD RIGHT ARM  Result Value Ref Range Status   Specimen Description   Final    BLOOD RIGHT ARM Performed at [facility], [address] [place], [place] [address]    Special Requests   Final    BOTTLES DRAWN AEROBIC AND ANAEROBIC Blood Culture results may not be optimal due to an inadequate volume of blood received in culture bottles Performed at [facility], [address] [place], [place] [address]    Culture PENDING  Incomplete   Report Status PENDING  Incomplete         Radiology Studies: DG Swallowing Func-Speech Pathology Result Date: 08/03/2023 Table formatting from the original result was not included. Modified Barium Swallow Study Patient Details Name: Denise Mckenzie MRN: [id number] Date of Birth: May 17, 1946 Today's Date: 08/03/2023 HPI/PMH: HPI: Patient is a 77 y.o. female with PMH: CAD, h/o CABG 1996, COPD, HTN, HLD, hypothyroidism, DM-2, chronic respiratory failure on 3L, h/o breast and lung cancer. She was recently admitted 11/25-12/6/24 with anasarca, CHF exacerbation, a-fib with RVR. She was discharged to [facility]. She presented to hospital on 12/15 with SOB, wheezing and altered. In ER, she was  hypoxic at 86% on home 3L oxygen, HR 150. CT chest suggestive of PNA. She was placed on BiPAP. SLP swallow evaluation ordered  secondary to patient c/o difficult with swallowing pills and RN observing patient to have instances of coughing when drinking liquids. Clinical Impression: Clinical Impression: Patient presents with a mild oral and pharyngoesophageal dysphagia as per this modified barium swallow study. During oral phase, she exhibited slow mastication and slow lingual movement, leading to delayed bolus transit from anterior to posterior portion of oral cavity. Swallow was initiated at level of vallecular sinus with puree, mechanical soft solids and honey thick liquids, and at posterior laryngeal surface of the epiglottis and at times delayed to pyriform sinus with thin and nectar thick liquids. Anterior hyoid excursion, laryngeal elevation and epiglottic inversion all appeared complete. No significant amount of pharyngeal residuals remained after swallows and no instances of penetration or aspiration observed at any phase of the swallow. When taken with thin liquids, barium tablet become briefly lodged in vallecular sinus but transited with more sips of liquids and bites of puree. When camera panned down to view esophagus, barium tablet observed to be at upper thoracic level of esophagus. Sips of liquids and bites of puree helped to fully transit pill. No barium stasis observed with other barium consistencies but SLP questions reduced esophageal motility. (no radiologist present to confirm) SLP not recommending further skilled intervention but recommends patient take breaks while eating, alternate solids and liquids and avoid rapid PO consumption. RN notified of results and recommendations.  Factors that may increase risk of adverse event in presence of aspiration Rubye Oaks & Clearance Coots 2021): Factors that may increase risk of adverse event in presence of aspiration Rubye Oaks & Clearance Coots 2021): Limited mobility; Poor general health and/or compromised immunity; Frail or deconditioned; Respiratory or GI disease Recommendations/Plan: Swallowing  Evaluation Recommendations Swallowing Evaluation Recommendations Recommendations: PO diet PO Diet Recommendation: Regular; Thin liquids (Level 0) Liquid Administration via: Straw; Cup Medication Administration: Whole meds with liquid Supervision: Patient able to self-feed; Set-up assistance for safety Swallowing strategies  : Minimize environmental distractions; Slow rate Postural changes: Stay upright 30-60 min after meals; Position pt fully upright for meals Oral care recommendations: Oral care BID (2x/day) Treatment Plan Treatment Plan Treatment recommendations: No treatment recommended at this time Follow-up recommendations: No SLP follow up Functional status assessment: Patient has not had a recent decline in their functional status. Recommendations Recommendations for follow up therapy are one component of a multi-disciplinary discharge planning process, led by the attending physician.  Recommendations may be updated based on patient status, additional functional criteria and insurance authorization. Assessment: Orofacial Exam: Orofacial Exam Oral Cavity - Dentition: Adequate natural dentition Anatomy: Anatomy: WFL Boluses Administered: Boluses Administered Boluses Administered: Thin liquids (Level 0); Mildly thick liquids (Level 2, nectar thick); Moderately thick liquids (Level 3, honey thick); Puree; Solid  Oral Impairment Domain: Oral Impairment Domain Lip Closure: Escape from interlabial space or lateral juncture, no extension beyond vermillion border Tongue control during bolus hold: Not tested Bolus preparation/mastication: Slow prolonged chewing/mashing with complete recollection Bolus transport/lingual motion: Slow tongue motion Oral residue: Trace residue lining oral structures Location of oral residue : Tongue; Floor of mouth Initiation of pharyngeal swallow : Posterior laryngeal surface of the epiglottis; Pyriform sinuses  Pharyngeal Impairment Domain: Pharyngeal Impairment Domain Soft palate  elevation: No bolus between soft palate (SP)/pharyngeal wall (PW) Laryngeal elevation: Complete superior movement of thyroid cartilage with complete approximation of arytenoids to epiglottic petiole Anterior hyoid excursion: Complete anterior movement Epiglottic movement: Complete inversion Laryngeal vestibule closure: Complete, no air/contrast in laryngeal vestibule Pharyngeal stripping wave : Present - complete Pharyngeal contraction (A/P view only): N/A Pharyngoesophageal segment opening: Complete distension and complete duration, no obstruction of flow Tongue base retraction: No contrast between tongue base and posterior pharyngeal wall (PPW) Pharyngeal residue: Complete pharyngeal clearance Location of pharyngeal residue: N/A  Esophageal Impairment Domain: Esophageal Impairment Domain Esophageal clearance upright position: Complete clearance, esophageal coating Pill: Pill Consistency administered: Thin liquids (Level 0); Puree Thin liquids (Level 0): Impaired (see clinical impressions) Puree: Impaired (see clinical impressions) Penetration/Aspiration Scale Score: Penetration/Aspiration Scale Score 1.  Material does not enter airway: Thin liquids (Level 0); Mildly thick liquids (Level 2, nectar thick); Moderately thick liquids (Level 3, honey thick); Puree; Solid; Pill Compensatory Strategies: Compensatory Strategies Compensatory strategies: Yes Straw: Effective Effective Straw: Thin liquid (Level 0) Liquid wash: Effective Effective Liquid Wash: Pill; Moderately thick liquid (Level 3, honey thick)   General Information: No data recorded Diet Prior to this Study: Regular; Thin liquids (Level 0)   Temperature : Normal   Respiratory Status: WFL   Supplemental O2: Nasal cannula   History of Recent Intubation: No  Behavior/Cognition: Alert; Cooperative; Pleasant mood Self-Feeding Abilities: Able to self-feed Baseline vocal quality/speech: Normal Volitional Cough: Able to elicit Volitional Swallow: Able to elicit Exam  Limitations: No limitations Goal Planning: No data recorded No data recorded No data recorded Patient/Family Stated Goal: patient reports h/o difficulty with larger pills Consulted and agree with results and recommendations: Patient; Nurse Pain: Pain Assessment  Pain Assessment: No/denies pain Pain Score: 0 End of Session: Start Time:SLP Start Time (ACUTE ONLY): 1120 Stop Time: SLP Stop Time (ACUTE ONLY): 1135 Time Calculation:SLP Time Calculation (min) (ACUTE ONLY): 15 min Charges: SLP Evaluations $ SLP Speech Visit: 1 Visit SLP Evaluations $BSS Swallow: 1 Procedure $MBS Swallow: 1 Procedure SLP visit diagnosis: SLP Visit Diagnosis: Dysphagia, pharyngoesophageal phase (R13.14); Dysphagia, oral phase (R13.11) Past Medical History: Past Medical History: Diagnosis Date  Acute exacerbation of CHF (congestive heart failure) (HCC) 07/12/2023  Arthritis   Breast cancer (HCC) dx'd 09/2015  COPD (chronic obstructive pulmonary disease) (HCC)   Coronary artery disease   Depression   Diabetes mellitus (HCC) 10/22/2015  Full dentures   full upper dentures, plate on bottom  History of bronchitis   History of pneumonia   Hyperlipidemia   Hypertension   Hypothyroidism 10/22/2015  lung ca dx;d 09/2015  lung cancer  Myocardial infarction [facility]) July 1995  Osteoporosis 10/22/2015  Personal history of radiation therapy   Stress incontinence   Uterine cancer (HCC) dx'd 1968 Past Surgical History: Past Surgical History: Procedure Laterality Date  ABDOMINAL HYSTERECTOMY    1968  APPENDECTOMY    with hysterectomy  BLADDER SURGERY    1972  BREAST BIOPSY Left 11/04/2015  Malignant  BREAST BIOPSY Left 11/04/2015  Malignant  BREAST LUMPECTOMY Left 11/19/2015  CARDIAC CATHETERIZATION  1995, 1996  Dr. Donnie Aho saw here then here at [facility] both times; 'they did a balloon and opened it up' first time, the 2nd was scheduled d/t abnormal stress test  CARDIOVERSION N/A 07/16/2023  Procedure: CARDIOVERSION;  Surgeon: Sande Rives, MD;  Location: [facility]  INVASIVE CV LAB;  Service: Cardiovascular;  Laterality: N/A;  COLONOSCOPY W/ POLYPECTOMY    CORONARY ARTERY BYPASS GRAFT    11/1994  feet surgery    1991, screws in both feet to fix deformity from birth  LUNG BIOPSY N/A 11/13/2015  Procedure: RIGHT LUNG BIOPSY;  Surgeon: Delight Ovens, MD;  Location: [facility] OR;  Service: Thoracic;  Laterality: N/A;  MULTIPLE TOOTH EXTRACTIONS    RADIOACTIVE SEED GUIDED PARTIAL MASTECTOMY WITH AXILLARY SENTINEL LYMPH NODE BIOPSY Left 11/19/2015  Procedure: RADIOACTIVE SEED GUIDED PARTIAL MASTECTOMY WITH AXILLARY SENTINEL LYMPH NODE BIOPSY;  Surgeon: Manus Rudd, MD;  Location: [facility];  Service: General;  Laterality: Left;  RE-EXCISION OF BREAST LUMPECTOMY Left 12/03/2015  Procedure: RE-EXCISION INFERIOR MARGIN OF LEFT BREAST LUMPECTOMY;  Surgeon: Manus Rudd, MD;  Location: [facility] OR;  Service: General;  Laterality: Left;  TMJ ARTHROPLASTY    1979  TONSILLECTOMY    TRANSESOPHAGEAL ECHOCARDIOGRAM (CATH LAB) N/A 07/16/2023  Procedure: TRANSESOPHAGEAL ECHOCARDIOGRAM;  Surgeon: Sande Rives, MD;  Location: [facility] INVASIVE CV LAB;  Service: Cardiovascular;  Laterality: N/A;  VIDEO BRONCHOSCOPY WITH ENDOBRONCHIAL NAVIGATION N/A 11/13/2015  Procedure: VIDEO BRONCHOSCOPY WITH ENDOBRONCHIAL NAVIGATION, with placement of fudicial markers;  Surgeon: Delight Ovens, MD;  Location: [facility] OR;  Service: Thoracic;  Laterality: N/A; Angela Nevin, MA, CCC-SLP Speech Therapy   CT Angio Chest PE W and/or Wo Contrast Result Date: 08/01/2023 CLINICAL DATA:  Pulmonary embolism (PE) suspected, low to intermediate prob, neg D-dimer EXAM: CT ANGIOGRAPHY CHEST WITH CONTRAST TECHNIQUE: Multidetector CT imaging of the chest was performed using the standard protocol during bolus administration of intravenous contrast. Multiplanar CT image reconstructions and MIPs were obtained to evaluate the vascular anatomy. RADIATION DOSE REDUCTION: This exam was performed according to the departmental  dose-optimization program which includes automated exposure control, adjustment of the mA and/or kV according  to patient size and/or use of iterative reconstruction technique. CONTRAST:  75mL OMNIPAQUE IOHEXOL 350 MG/ML SOLN COMPARISON:  07/13/2023 FINDINGS: Cardiovascular: Satisfactory opacification of the pulmonary arteries. No pulmonary arterial filling defects. Pulmonary arteries are mildly dilated. Thoracic aorta is nonaneurysmal. Atherosclerotic calcifications of the aorta and coronary arteries. Prior CABG. Heart size is upper limits of normal. No pericardial effusion. Mediastinum/Nodes: No enlarged mediastinal, hilar, or axillary lymph nodes. Thyroid gland, trachea, and esophagus demonstrate no significant findings. Lungs/Pleura: Small layering bilateral pleural effusions. Dependent atelectasis or consolidation of the left lower lobe and to a lesser degree within the right lower lobe and lingula. Chronic scarring within the right upper lobe associated with fiducial markers, unchanged. Background of emphysema. No pneumothorax. Upper Abdomen: Cholelithiasis. Stable bilateral low-attenuation adrenal nodules, previously described as adenomas. These do not require follow-up imaging. Small volume ascites within the upper abdomen. Musculoskeletal: Exaggerated thoracic kyphosis. Prior sternotomy. No new or acute bony findings. No significant chest wall abnormality. Review of the MIP images confirms the above findings. IMPRESSION: 1. No evidence of pulmonary embolism. 2. Small layering bilateral pleural effusions. Dependent atelectasis or consolidation of the left lower lobe and to a lesser degree within the right lower lobe and lingula. 3. Small volume ascites within the upper abdomen. 4. Cholelithiasis. 5. Aortic and coronary artery atherosclerosis (ICD10-I70.0). 6. Emphysema (ICD10-J43.9). Electronically Signed   By: Duanne Guess D.O.   On: 08/01/2023 19:15   CT Head Wo Contrast Result Date:  08/01/2023 CLINICAL DATA:  Mental status change EXAM: CT HEAD WITHOUT CONTRAST TECHNIQUE: Contiguous axial images were obtained from the base of the skull through the vertex without intravenous contrast. RADIATION DOSE REDUCTION: This exam was performed according to the departmental dose-optimization program which includes automated exposure control, adjustment of the mA and/or kV according to patient size and/or use of iterative reconstruction technique. COMPARISON:  CT head 07/12/2023 FINDINGS: Brain: No intracranial hemorrhage, mass effect, or evidence of acute infarct. No hydrocephalus. No extra-axial fluid collection. Age-commensurate cerebral atrophy and chronic small vessel ischemic disease. Vascular: No hyperdense vessel. Intracranial arterial calcification. Skull: No fracture or focal lesion. Sinuses/Orbits: No acute finding. Other: None. IMPRESSION: No acute intracranial abnormality. Electronically Signed   By: Minerva Fester M.D.   On: 08/01/2023 19:12   DG Chest Port 1 View Result Date: 08/01/2023 CLINICAL DATA:  Shortness of breath, wheezing, low oxygen saturations EXAM: PORTABLE CHEST 1 VIEW COMPARISON:  07/30/2023 FINDINGS: Increased size of the cardiac silhouette compared with 07/30/2023. Aortic atherosclerotic calcification. Sternotomy and CABG increased bibasilar airspace and interstitial opacities. Increased opacities about the surgical clips in the right upper lobe. Layering bilateral pleural effusions. No pneumothorax. IMPRESSION: 1. Increased size of the cardiac silhouette compared with 07/30/2023. This may be in part due to patient rotation however is concerning for pericardial effusion. CT is recommended for further evaluation. 2. Increased bibasilar airspace and interstitial opacities. Pulmonary edema is favored though multifocal pneumonia could appear similarly. 3. Layering bilateral pleural effusions. Electronically Signed   By: Minerva Fester M.D.   On: 08/01/2023 17:43            LOS: 2 days   Time spent= 35 mins    Miguel Rota, MD [facility]  If 7PM-7AM, please contact night-coverage  08/03/2023, 1:32 PM

## 2023-08-03 NOTE — Plan of Care (Signed)
  Problem: Education: Goal: Ability to describe self-care measures that may prevent or decrease complications (Diabetes Survival Skills Education) will improve Outcome: Progressing Goal: Individualized Educational Video(s) Outcome: Progressing   Problem: Coping: Goal: Ability to adjust to condition or change in health will improve Outcome: Progressing   Problem: Health Behavior/Discharge Planning: Goal: Ability to identify and utilize available resources and services will improve Outcome: Progressing Goal: Ability to manage health-related needs will improve Outcome: Progressing   Problem: Metabolic: Goal: Ability to maintain appropriate glucose levels will improve Outcome: Progressing   Problem: Nutritional: Goal: Maintenance of adequate nutrition will improve Outcome: Progressing Goal: Progress toward achieving an optimal weight will improve Outcome: Progressing   Problem: Tissue Perfusion: Goal: Adequacy of tissue perfusion will improve Outcome: Progressing   Problem: Education: Goal: Knowledge of General Education information will improve Description: Including pain rating scale, medication(s)/side effects and non-pharmacologic comfort measures Outcome: Progressing   Problem: Health Behavior/Discharge Planning: Goal: Ability to manage health-related needs will improve Outcome: Progressing   Problem: Clinical Measurements: Goal: Will remain free from infection Outcome: Progressing Goal: Diagnostic test results will improve Outcome: Progressing Goal: Respiratory complications will improve Outcome: Progressing Goal: Cardiovascular complication will be avoided Outcome: Progressing   Problem: Activity: Goal: Risk for activity intolerance will decrease Outcome: Progressing   Problem: Coping: Goal: Level of anxiety will decrease Outcome: Progressing   Problem: Elimination: Goal: Will not experience complications related to bowel motility Outcome:  Progressing Goal: Will not experience complications related to urinary retention Outcome: Progressing   Problem: Pain Management: Goal: General experience of comfort will improve Outcome: Progressing   Problem: Safety: Goal: Ability to remain free from injury will improve Outcome: Progressing

## 2023-08-03 NOTE — Plan of Care (Signed)
  Problem: Coping: Goal: Ability to adjust to condition or change in health will improve Outcome: Progressing   Problem: Fluid Volume: Goal: Ability to maintain a balanced intake and output will improve Outcome: Progressing   Problem: Skin Integrity: Goal: Risk for impaired skin integrity will decrease Outcome: Progressing   Problem: Clinical Measurements: Goal: Ability to maintain clinical measurements within normal limits will improve Outcome: Progressing

## 2023-08-04 DIAGNOSIS — R7881 Bacteremia: Secondary | ICD-10-CM | POA: Diagnosis not present

## 2023-08-04 DIAGNOSIS — B952 Enterococcus as the cause of diseases classified elsewhere: Secondary | ICD-10-CM | POA: Diagnosis not present

## 2023-08-04 DIAGNOSIS — J9622 Acute and chronic respiratory failure with hypercapnia: Secondary | ICD-10-CM | POA: Diagnosis not present

## 2023-08-04 DIAGNOSIS — I9589 Other hypotension: Secondary | ICD-10-CM

## 2023-08-04 DIAGNOSIS — I480 Paroxysmal atrial fibrillation: Secondary | ICD-10-CM | POA: Diagnosis not present

## 2023-08-04 LAB — BASIC METABOLIC PANEL
Anion gap: 8 (ref 5–15)
BUN: 33 mg/dL — ABNORMAL HIGH (ref 8–23)
CO2: 39 mmol/L — ABNORMAL HIGH (ref 22–32)
Calcium: 7.2 mg/dL — ABNORMAL LOW (ref 8.9–10.3)
Chloride: 94 mmol/L — ABNORMAL LOW (ref 98–111)
Creatinine, Ser: 0.63 mg/dL (ref 0.44–1.00)
GFR, Estimated: >60 mL/min (ref >60)
Glucose, Bld: 71 mg/dL (ref 70–99)
Potassium: 3 mmol/L — ABNORMAL LOW (ref 3.5–5.1)
Sodium: 141 mmol/L (ref 135–145)

## 2023-08-04 LAB — CBC
HCT: 43.8 % (ref 36.0–46.0)
Hemoglobin: 13.3 g/dL (ref 12.0–15.0)
MCH: 29 pg (ref 26.0–34.0)
MCHC: 30.4 g/dL (ref 30.0–36.0)
MCV: 95.6 fL (ref 80.0–100.0)
Platelets: 128 10*3/uL — ABNORMAL LOW (ref 150–400)
RBC: 4.58 MIL/uL (ref 3.87–5.11)
RDW: 16.3 % — ABNORMAL HIGH (ref 11.5–15.5)
WBC: 18 10*3/uL — ABNORMAL HIGH (ref 4.0–10.5)
nRBC: 0 % (ref 0.0–0.2)

## 2023-08-04 LAB — CULTURE, BLOOD (ROUTINE X 2)

## 2023-08-04 LAB — GLUCOSE, CAPILLARY
Glucose-Capillary: 105 mg/dL — ABNORMAL HIGH (ref 70–99)
Glucose-Capillary: 123 mg/dL — ABNORMAL HIGH (ref 70–99)
Glucose-Capillary: 127 mg/dL — ABNORMAL HIGH (ref 70–99)
Glucose-Capillary: 145 mg/dL — ABNORMAL HIGH (ref 70–99)
Glucose-Capillary: 72 mg/dL (ref 70–99)
Glucose-Capillary: 80 mg/dL (ref 70–99)
Glucose-Capillary: 80 mg/dL (ref 70–99)

## 2023-08-04 LAB — MAGNESIUM: Magnesium: 1.6 mg/dL — ABNORMAL LOW (ref 1.7–2.4)

## 2023-08-04 LAB — LEGIONELLA PNEUMOPHILA SEROGP 1 UR AG: L. pneumophila Serogp 1 Ur Ag: NEGATIVE

## 2023-08-04 MED ORDER — BIOTENE DRY MOUTH MT LIQD
15.0000 mL | OROMUCOSAL | Status: DC | PRN
  Administered 2023-08-05: 15 mL via OROMUCOSAL
  Filled 2023-08-04: qty 237

## 2023-08-04 MED ORDER — ALUM & MAG HYDROXIDE-SIMETH 200-200-20 MG/5ML PO SUSP
30.0000 mL | ORAL | Status: DC | PRN
  Administered 2023-08-06: 30 mL via ORAL
  Filled 2023-08-04: qty 30

## 2023-08-04 MED ORDER — POTASSIUM CHLORIDE 20 MEQ PO PACK: 40.0000 meq | PACK | Freq: Two times a day (BID) | ORAL | Status: DC

## 2023-08-04 MED ORDER — TORSEMIDE 20 MG PO TABS
40.0000 mg | ORAL_TABLET | Freq: Two times a day (BID) | ORAL | Status: DC
  Administered 2023-08-04 – 2023-08-05 (×4): 40 mg via ORAL
  Filled 2023-08-04 (×6): qty 2

## 2023-08-04 MED ORDER — ORAL CARE MOUTH RINSE
15.0000 mL | OROMUCOSAL | Status: DC
  Administered 2023-08-04 – 2023-08-07 (×10): 15 mL via OROMUCOSAL

## 2023-08-04 MED ORDER — CARMEX CLASSIC LIP BALM EX OINT
1.0000 | TOPICAL_OINTMENT | CUTANEOUS | Status: DC | PRN
  Filled 2023-08-04: qty 10

## 2023-08-04 MED ORDER — SODIUM CHLORIDE 0.9 % IV SOLN
2.0000 g | INTRAVENOUS | Status: DC
  Administered 2023-08-04 – 2023-08-12 (×47): 2 g via INTRAVENOUS
  Filled 2023-08-04 (×50): qty 2000

## 2023-08-04 MED ORDER — POTASSIUM CHLORIDE 20 MEQ PO PACK
40.0000 meq | PACK | ORAL | Status: AC
  Administered 2023-08-04 (×3): 40 meq via ORAL
  Filled 2023-08-04 (×3): qty 2

## 2023-08-04 MED ORDER — MAGNESIUM SULFATE 4 GM/100ML IV SOLN
4.0000 g | Freq: Once | INTRAVENOUS | Status: AC
  Administered 2023-08-04: 4 g via INTRAVENOUS
  Filled 2023-08-04: qty 100

## 2023-08-04 MED ORDER — ORAL CARE MOUTH RINSE
15.0000 mL | OROMUCOSAL | Status: DC | PRN
Start: 2023-08-04 — End: 2023-08-07

## 2023-08-04 NOTE — Evaluation (Signed)
Physical Therapy Evaluation Patient Details Name: Denise Mckenzie MRN: [id number] DOB: [birth year] Today's Date: 08/04/2023  History of Present Illness  This is a 77 year old female presented from [facility] with shornesst of breath, wheezing and altered. Found to have acute on chronic respiratory failure with hypercapnia and HCAP.  PHMx: CAD ( h/o CABG 83), COPD, HTN, HLD, hypothyroidism, T2DM, chronic respiratory failure 3L,  h/o breast and lung cancer.  Patient recently admitted 11/25 - 12/6 with anasarca, CHF exacerbation, A-fib with RVR.  Clinical Impression  Pt admitted with above diagnosis.  Pt currently with functional limitations due to the deficits listed below (see PT Problem List). Pt will benefit from acute skilled PT to increase their independence and safety with mobility to allow discharge.     The patient has been in SNF/rehab  since last admission(2 weeks ago).   The patient requires extensive assistance for mobility to roll and sit up on the bed edge. Patient is too weak to attempt standing. Recommend lift for OOB. Patient will benefit from continued inpatient follow up therapy, <3 hours/day.sign        If plan is discharge home, recommend the following: Two people to help with walking and/or transfers;A little help with bathing/dressing/bathroom;Assistance with cooking/housework;Help with stairs or ramp for entrance;Assist for transportation   Can travel by private vehicle   No    Equipment Recommendations None recommended by PT  Recommendations for Other Services       Functional Status Assessment Patient has had a recent decline in their functional status and/or demonstrates limited ability to make significant improvements in function in a reasonable and predictable amount of time     Precautions / Restrictions Precautions Precautions: Fall Precaution Comments: buttock sores; reported dizziness with positional changes (BP low in sitting compared to when supine  based off numbers on screen before we sat up and pt reported some spinning), quite kyphotic and short stature Restrictions Weight Bearing Restrictions Per Provider Order: No      Mobility  Bed Mobility                    Transfers                        Ambulation/Gait                  Stairs            Wheelchair Mobility     Tilt Bed    Modified Rankin (Stroke Patients Only)       Balance Overall balance assessment: Needs assistance Sitting-balance support: Feet unsupported, Bilateral upper extremity supported Sitting balance-Leahy Scale: Poor Sitting balance - Comments: varied between min A-max A                                     Pertinent Vitals/Pain Pain Assessment Faces Pain Scale: Hurts even more Pain Location: buttocks Pain Descriptors / Indicators: Sore Pain Intervention(s): Monitored during session, Repositioned    Home Living Family/patient expects to be discharged to:: Skilled nursing facility                 Home Equipment: Rollator (4 wheels);Shower seat;Cane - single point;BSC/3in1      Prior Function               Mobility Comments: Was able to walk with SPC the Monday before  Thanksgiving has not be able to walk since then       Extremity/Trunk Assessment   Upper Extremity Assessment Upper Extremity Assessment: Generalized weakness    Lower Extremity Assessment Lower Extremity Assessment: RLE deficits/detail;LLE deficits/detail RLE Deficits / Details: multiple abrasions and sores, grossly 3/5 hip flex, knee ext LLE Deficits / Details: abrasions and sores, grossly 2/5 hip flexion, 2+ knee ext    Cervical / Trunk Assessment Cervical / Trunk Assessment: Kyphotic  Communication   Communication Communication: No apparent difficulties  Cognition Arousal: Alert Behavior During Therapy: WFL for tasks assessed/performed, Anxious Overall Cognitive Status: Within Functional Limits  for tasks assessed                                          General Comments      Exercises     Assessment/Plan    PT Assessment Patient needs continued PT services  PT Problem List Decreased strength;Decreased activity tolerance;Decreased balance;Decreased mobility;Decreased knowledge of use of DME;Decreased range of motion       PT Treatment Interventions DME instruction;Gait training;Stair training;Functional mobility training;Therapeutic activities;Therapeutic exercise;Balance training;Patient/family education    PT Goals (Current goals can be found in the Care Plan section)  Acute Rehab PT Goals Patient Stated Goal: return home and get stronger PT Goal Formulation: With patient/family Time For Goal Achievement: 08/18/23 Potential to Achieve Goals: Fair    Frequency Min 1X/week     Co-evaluation PT/OT/SLP Co-Evaluation/Treatment: Yes Reason for Co-Treatment: For patient/therapist safety;To address functional/ADL transfers PT goals addressed during session: Mobility/safety with mobility;Balance;Strengthening/ROM OT goals addressed during session: Strengthening/ROM;ADL's and self-care       AM-PAC PT "6 Clicks" Mobility  Outcome Measure Help needed turning from your back to your side while in a flat bed without using bedrails?: Total Help needed moving from lying on your back to sitting on the side of a flat bed without using bedrails?: Total Help needed moving to and from a bed to a chair (including a wheelchair)?: Total Help needed standing up from a chair using your arms (e.g., wheelchair or bedside chair)?: Total Help needed to walk in hospital room?: Total Help needed climbing 3-5 steps with a railing? : Total 6 Click Score: 6    End of Session Equipment Utilized During Treatment: Oxygen Activity Tolerance: Patient limited by fatigue Patient left: with call bell/phone within reach;with bed alarm set;in bed;with family/visitor present Nurse  Communication: Mobility status;Need for lift equipment PT Visit Diagnosis: Unsteadiness on feet (R26.81);Other abnormalities of gait and mobility (R26.89);Muscle weakness (generalized) (M62.81);Repeated falls (R29.6)    Time: 4098-1191 PT Time Calculation (min) (ACUTE ONLY): 30 min   Charges:   PT Evaluation $PT Eval Low Complexity: 1 Low   PT General Charges $$ ACUTE PT VISIT: 1 Visit         Blanchard Kelch PT Acute Rehabilitation Services Office [phone] Weekend pager-[phone]   Rada Hay 08/04/2023, 2:51 PM

## 2023-08-04 NOTE — Evaluation (Signed)
Occupational Therapy Evaluation Patient Details Name: Denise Mckenzie MRN: [id number] DOB: Sep 26, 1945 Today's Date: 08/04/2023   History of Present Illness This is a 77 year old female presented from [facility] with shornesst of breath, wheezing and altered. Found to have acute on chronic respiratory failure with hypercapnia and HCAP.  PHMx: CAD ( h/o CABG 69), COPD, HTN, HLD, hypothyroidism, T2DM, chronic respiratory failure 3L,  h/o breast and lung cancer.  Patient recently admitted 11/25 - 12/6 with anasarca, CHF exacerbation, A-fib with RVR.   Clinical Impression   This 77 yo female admitted with above presents to acute OT with PLOF since the Monday before Thanksgiving of being totally independent to Mod I with all basic ADLs. Currently she is setup-total A with basic ADLs at bed level (due to decreased balance at EOB) and she is Mod A to roll in bed with Total A +2 supine<>sit. She will continue to benefit from acute OT with follow up from continued inpatient follow up therapy, <3 hours/day.        If plan is discharge home, recommend the following: Two people to help with walking and/or transfers;Two people to help with bathing/dressing/bathroom;Help with stairs or ramp for entrance;Assist for transportation    Functional Status Assessment  Patient has had a recent decline in their functional status and demonstrates the ability to make significant improvements in function in a reasonable and predictable amount of time.  Equipment Recommendations  Other (comment) (TBD next venue)       Precautions / Restrictions Precautions Precautions: Fall Precaution Comments: buttock sores; reported dizziness with positional changes (BP low in sitting compared to when supine based off numbers on screen before we sat up and pt reported some spinning) Restrictions Weight Bearing Restrictions Per Provider Order: No      Mobility Bed Mobility Overal bed mobility: Needs Assistance Bed  Mobility: Supine to Sit, Rolling, Sit to Supine Rolling: Mod assist   Supine to sit: Total assist, +2 for physical assistance Sit to supine: Total assist, +2 for physical assistance            Balance Overall balance assessment: Needs assistance Sitting-balance support: Feet unsupported, Bilateral upper extremity supported (due to height of bed and stature of patient) Sitting balance-Leahy Scale: Poor Sitting balance - Comments: varied between min A-max A Postural control: Posterior lean                                 ADL either performed or assessed with clinical judgement   ADL Overall ADL's : Needs assistance/impaired Eating/Feeding: Set up;Bed level   Grooming: Set up;Supervision/safety;Bed level   Upper Body Bathing: Moderate assistance;Bed level   Lower Body Bathing: Total assistance;Bed level   Upper Body Dressing : Maximal assistance;Bed level   Lower Body Dressing: Total assistance;Bed level                       Vision Patient Visual Report: No change from baseline              Pertinent Vitals/Pain Pain Assessment Pain Assessment: Faces Faces Pain Scale: Hurts even more Pain Location: buttocks Pain Descriptors / Indicators: Sore Pain Intervention(s): Limited activity within patient's tolerance, Monitored during session, Repositioned     Extremity/Trunk Assessment Upper Extremity Assessment Upper Extremity Assessment: Generalized weakness              Cognition Arousal: Alert Behavior During Therapy: [facility] for  tasks assessed/performed, Anxious Overall Cognitive Status: Within Functional Limits for tasks assessed                                                  Home Living Family/patient expects to be discharged to:: Skilled nursing facility                             Home Equipment: Rollator (4 wheels);Shower seat;Cane - single point;BSC/3in1          Prior  Functioning/Environment               Mobility Comments: Was able to walk with SPC the Monday before Thanksgiving has not be able to walk since then          OT Problem List: Decreased strength;Decreased range of motion;Decreased activity tolerance;Impaired balance (sitting and/or standing);Decreased safety awareness;Pain      OT Treatment/Interventions: Self-care/ADL training;Therapeutic exercise;DME and/or AE instruction;Therapeutic activities;Patient/family education;Balance training    OT Goals(Current goals can be found in the care plan section) Acute Rehab OT Goals Patient Stated Goal: agreeable to work with therapy OT Goal Formulation: With patient Time For Goal Achievement: 08/25/23 Potential to Achieve Goals: Good  OT Frequency: Min 1X/week    Co-evaluation PT/OT/SLP Co-Evaluation/Treatment: Yes Reason for Co-Treatment: For patient/therapist safety;To address functional/ADL transfers PT goals addressed during session: Mobility/safety with mobility;Balance;Strengthening/ROM OT goals addressed during session: Strengthening/ROM;ADL's and self-care      AM-PAC OT "6 Clicks" Daily Activity     Outcome Measure Help from another person eating meals?: A Little Help from another person taking care of personal grooming?: A Little Help from another person toileting, which includes using toliet, bedpan, or urinal?: Total Help from another person bathing (including washing, rinsing, drying)?: A Lot Help from another person to put on and taking off regular upper body clothing?: A Lot Help from another person to put on and taking off regular lower body clothing?: Total 6 Click Score: 12   End of Session Equipment Utilized During Treatment: Oxygen (3 liters)  Activity Tolerance: Patient tolerated treatment well (did report dizziness with positional changes) Patient left: in bed;with call bell/phone within reach;with bed alarm set;with family/visitor present  OT Visit  Diagnosis: Unsteadiness on feet (R26.81);Other abnormalities of gait and mobility (R26.89);Muscle weakness (generalized) (M62.81);Dizziness and giddiness (R42);Pain Pain - part of body:  (buttocks)                Time: 4696-2952 OT Time Calculation (min): 34 min Charges:  OT General Charges $OT Visit: 1 Visit OT Evaluation $OT Eval Moderate Complexity: 1 Mod  Cathy L. OT Acute Rehabilitation Services Office [phone]    Evette Georges 08/04/2023, 1:45 PM

## 2023-08-04 NOTE — Progress Notes (Signed)
   08/04/23 2333  BiPAP/CPAP/SIPAP  BiPAP/CPAP/SIPAP Pt Type Adult  BiPAP/CPAP/SIPAP V60  Mask Type Full face mask  Mask Size Small  Set Rate 14 breaths/min  Respiratory Rate 14 breaths/min  IPAP 17 cmH20  EPAP 5 cmH2O  FiO2 (%) 35 %  Minute Ventilation 5.9  Leak 27  Peak Inspiratory Pressure (PIP) 17  Tidal Volume (Vt) 351  Patient Home Equipment No  Auto Titrate No  Press High Alarm 35 cmH2O  Press Low Alarm 5 cmH2O  CPAP/SIPAP surface wiped down Yes  BiPAP/CPAP /SiPAP Vitals  Pulse Rate 69  Resp 14  SpO2 94 %  MEWS Score/Color  MEWS Score 0  MEWS Score Color [place] Si

## 2023-08-04 NOTE — Progress Notes (Signed)
PROGRESS NOTE    Denise Mckenzie  EXB:[id number] DOB: [birth year] DOA: 08/01/2023 PCP: Burton Apley, MD    Brief Narrative:   77 year old female with past medical history of CAD ( h/o CABG 3), COPD, HTN, HLD, hypothyroidism, T2DM, chronic respiratory failure 3L,  h/o breast and lung cancer.  Patient recently admitted 11/25 - 12/6 with anasarca, CHF exacerbation, A-fib with RVR.  Patient treated with diuresis.  She underwent TEE/DCCV for A-fib.  Patient discharged to [facility].  Patient brought back in after she became short of breath, wheezing and altered.  Initially noted to be in hypercapnic respiratory failure requiring BiPAP.  CT suggestive of possible pneumonia and started on vancomycin and cefepime.  Eventually blood cultures growing GPC/Enterococcus faecalis therefore transition to Unasyn per ID recommendations.  Cardiology following the patient as well.  Echocardiogram showed preserved EF without vegetation.   Assessment & Plan:  Principal Problem:   Acute on chronic respiratory failure with hypercapnia (HCC) Active Problems:   Coronary artery disease   Hypertension   Hypothyroidism   Primary cancer of right upper lobe of lung (HCC)   COPD (chronic obstructive pulmonary disease) (HCC)   Chronic diastolic (congestive) heart failure (HCC)   PAF (paroxysmal atrial fibrillation) (HCC)   HCAP (healthcare-associated pneumonia)   Acute on chronic hypercapnic respiratory failure Healthcare acquired pneumonia Enterococcus faecalis bacteremia History of COPD - Weaned off BiPAP.  MRSA swab negative.  Blood cultures growing GPC/Enterococcus faecalis.  Antibiotics per ID.  Echo ef 65%, enlarged RV.  Seen by speech and swallow, recommending regular diet with aspiration precautions. - Empiric prednisone daily for 5 days - ID Following.  - COVID, flu, RSV negative, urine strep antigen-negative  Diabetes mellitus type 2 -Sliding scale and Accu-Cheks  History of coronary artery  disease with history of CABG in 1996 CHF with preserved EF with severe RV failure/dilation - Echo shows EF 65%, elevated PASP, severe RV failure.  Signs of third spacing but no obvious intravascular volume overload.  Continue home torsemide 40 mg orally daily. -Home meds Eliquis, statin, Cardizem, Farxiga  Paroxysmal atrial fibrillation status post cardioversion 07/16/2023 -Currently on amiodarone, digoxin, Cardizem.  Continue Eliquis.  Seen by cardiology  History of right upper lobe lung cancer History of breast cancer - Both in remission  Hyperlipidemia -Statin  Hypothyroidism -Synthroid   DVT prophylaxis: apixaban (ELIQUIS) tablet 5 mg   Code Status: Full code Family Communication:  Status is: Inpatient Remains inpatient appropriate because: Continue hospital stay for management of her respiratory status    Subjective: Patient doing okay no complaints this morning.  Tolerating nightly BiPAP  Examination:  General exam: Appears calm and comfortable  Respiratory system: Bibasilar rhonchi Cardiovascular system: S1 & S2 heard, RRR. No JVD, murmurs, rubs, gallops or clicks. No pedal edema. Gastrointestinal system: Abdomen is nondistended, soft and nontender. No organomegaly or masses felt. Normal bowel sounds heard. Central nervous system: Alert and oriented. No focal neurological deficits. Extremities: Symmetric 5 x 5 power. Skin: No rashes, lesions or ulcers Psychiatry: Judgement and insight appear normal. Mood & affect appropriate.                Diet Orders (From admission, onward)     Start     Ordered   08/02/23 1348  Diet heart healthy/carb modified Room service appropriate? Yes with Assist; Fluid consistency: Thin; Fluid restriction: 1500 mL Fluid  Diet effective now       Question Answer Comment  Diet-HS Snack? Nothing   Room service  appropriate? Yes with Assist   Fluid consistency: Thin   Fluid restriction: 1500 mL Fluid      08/02/23 1347             Objective: Vitals:   08/04/23 0900 08/04/23 0932 08/04/23 0933 08/04/23 0943  BP:  (!) 105/49  (!) 105/49  Pulse: 62  81   Resp: (!) 22  19   Temp:      TempSrc:      SpO2: 93%  94%   Weight:      Height:        Intake/Output Summary (Last 24 hours) at 08/04/2023 1204 Last data filed at 08/04/2023 1025 Gross per 24 hour  Intake 1254.91 ml  Output 900 ml  Net 354.91 ml   Filed Weights   08/02/23 1839  Weight: 69.5 kg    Scheduled Meds:  amiodarone  200 mg Oral Daily   amitriptyline  50 mg Oral QHS   apixaban  5 mg Oral BID   Chlorhexidine Gluconate Cloth  6 each Topical Daily   digoxin  0.125 mg Oral Daily   diltiazem  120 mg Oral Daily   docusate sodium  100 mg Oral BID   insulin aspart  0-15 Units Subcutaneous Q4H   levothyroxine  88 mcg Oral QAC breakfast   midodrine  5 mg Oral TID WC   mometasone-formoterol  2 puff Inhalation BID   mouth rinse  15 mL Mouth Rinse 4 times per day   potassium chloride  40 mEq Oral Q4H   predniSONE  20 mg Oral Q breakfast   torsemide  40 mg Oral BID   Continuous Infusions:  ampicillin (OMNIPEN) IV Stopped (08/04/23 1146)    Nutritional status     Body mass index is 28.02 kg/m.  Data Reviewed:   CBC: Recent Labs  Lab 07/30/23 2126 08/01/23 1650 08/02/23 0257 08/03/23 0303 08/04/23 0259  WBC 20.9* 20.5* 19.6* 19.2* 18.0*  NEUTROABS  --  18.2* 18.8*  --   --   HGB 14.6 14.9 14.9 13.7 13.3  HCT 46.6* 47.5* 47.6* 43.4 43.8  MCV 92.6 95.4 94.1 94.6 95.6  PLT 174 171 157 141* 128*   Basic Metabolic Panel: Recent Labs  Lab 07/30/23 2126 08/01/23 1650 08/01/23 1957 08/02/23 0257 08/03/23 0303 08/04/23 0259  NA 139 141  --  142 145 141  K 4.2 3.9  --  4.1 3.5 3.0*  CL 92* 94*  --  96* 97* 94*  CO2 38* 40*  --  37* 40* 39*  GLUCOSE 156* 114*  --  116* 93 71  BUN 40* 39*  --  41* 45* 33*  CREATININE 0.99 0.78  --  0.71 0.75 0.63  CALCIUM 8.4* 7.9*  --  8.0* 7.8* 7.2*  MG  --   --  1.7 1.8 1.9 1.6*   PHOS  --   --   --   --  2.7  --    GFR: Estimated Creatinine Clearance: 52.1 mL/min (by C-G formula based on SCr of 0.63 mg/dL). Liver Function Tests: Recent Labs  Lab 08/01/23 1650  AST 47*  ALT 52*  ALKPHOS 86  BILITOT 0.7  PROT 5.0*  ALBUMIN 2.5*   No results for input(s): "LIPASE", "AMYLASE" in the last 168 hours. No results for input(s): "AMMONIA" in the last 168 hours. Coagulation Profile: No results for input(s): "INR", "PROTIME" in the last 168 hours. Cardiac Enzymes: No results for input(s): "CKTOTAL", "CKMB", "CKMBINDEX", "TROPONINI" in the last 168  hours. BNP (last 3 results) Recent Labs    02/22/23 1136 07/02/23 1645 07/09/23 1131  PROBNP 5,414* 21,913* 33,547*   HbA1C: No results for input(s): "HGBA1C" in the last 72 hours. CBG: Recent Labs  Lab 08/03/23 1941 08/04/23 0009 08/04/23 0342 08/04/23 0757 08/04/23 1134  GLUCAP 153* 127* 80 72 105*   Lipid Profile: No results for input(s): "CHOL", "HDL", "LDLCALC", "TRIG", "CHOLHDL", "LDLDIRECT" in the last 72 hours. Thyroid Function Tests: No results for input(s): "TSH", "T4TOTAL", "FREET4", "T3FREE", "THYROIDAB" in the last 72 hours. Anemia Panel: No results for input(s): "VITAMINB12", "FOLATE", "FERRITIN", "TIBC", "IRON", "RETICCTPCT" in the last 72 hours. Sepsis Labs: Recent Labs  Lab 07/30/23 2208 08/01/23 1654 08/01/23 1844 08/02/23 0257  PROCALCITON  --   --   --  <0.10  LATICACIDVEN 1.4 2.4* 2.1*  --     Recent Results (from the past 240 hours)  Resp panel by RT-PCR (RSV, Flu A&B, Covid) Anterior Nasal Swab     Status: None   Collection Time: 07/30/23  9:26 PM   Specimen: Anterior Nasal Swab  Result Value Ref Range Status   SARS Coronavirus 2 by RT PCR NEGATIVE NEGATIVE Final   Influenza A by PCR NEGATIVE NEGATIVE Final   Influenza B by PCR NEGATIVE NEGATIVE Final    Comment: (NOTE) The Xpert Xpress SARS-CoV-2/FLU/RSV plus assay is intended as an aid in the diagnosis of influenza  from Nasopharyngeal swab specimens and should not be used as a sole basis for treatment. Nasal washings and aspirates are unacceptable for Xpert Xpress SARS-CoV-2/FLU/RSV testing.  Fact Sheet for Patients: BloggerCourse.com  Fact Sheet for Healthcare Providers: SeriousBroker.it  This test is not yet approved or cleared by the [place] FDA and has been authorized for detection and/or diagnosis of SARS-CoV-2 by FDA under an Emergency Use Authorization (EUA). This EUA will remain in effect (meaning this test can be used) for the duration of the COVID-19 declaration under Section 564(b)(1) of the Act, 21 U.S.C. section 360bbb-3(b)(1), unless the authorization is terminated or revoked.     Resp Syncytial Virus by PCR NEGATIVE NEGATIVE Final    Comment: (NOTE) Fact Sheet for Patients: BloggerCourse.com  Fact Sheet for Healthcare Providers: SeriousBroker.it  This test is not yet approved or cleared by the [place] FDA and has been authorized for detection and/or diagnosis of SARS-CoV-2 by FDA under an Emergency Use Authorization (EUA). This EUA will remain in effect (meaning this test can be used) for the duration of the COVID-19 declaration under Section 564(b)(1) of the Act, 21 U.S.C. section 360bbb-3(b)(1), unless the authorization is terminated or revoked.  Performed at [facility], [address], [place], [place] [address]   Resp panel by RT-PCR (RSV, Flu A&B, Covid) Anterior Nasal Swab     Status: None   Collection Time: 08/01/23  4:43 PM   Specimen: Anterior Nasal Swab  Result Value Ref Range Status   SARS Coronavirus 2 by RT PCR NEGATIVE NEGATIVE Final    Comment: (NOTE) SARS-CoV-2 target nucleic acids are NOT DETECTED.  The SARS-CoV-2 RNA is generally detectable in upper respiratory specimens during the acute phase of infection. The  lowest concentration of SARS-CoV-2 viral copies this assay can detect is 138 copies/mL. A negative result does not preclude SARS-Cov-2 infection and should not be used as the sole basis for treatment or other patient management decisions. A negative result may occur with  improper specimen collection/handling, submission of specimen other than nasopharyngeal swab, presence of viral mutation(s) within  the areas targeted by this assay, and inadequate number of viral copies(<138 copies/mL). A negative result must be combined with clinical observations, patient history, and epidemiological information. The expected result is Negative.  Fact Sheet for Patients:  BloggerCourse.com  Fact Sheet for Healthcare Providers:  SeriousBroker.it  This test is no t yet approved or cleared by the [place] FDA and  has been authorized for detection and/or diagnosis of SARS-CoV-2 by FDA under an Emergency Use Authorization (EUA). This EUA will remain  in effect (meaning this test can be used) for the duration of the COVID-19 declaration under Section 564(b)(1) of the Act, 21 U.S.C.section 360bbb-3(b)(1), unless the authorization is terminated  or revoked sooner.       Influenza A by PCR NEGATIVE NEGATIVE Final   Influenza B by PCR NEGATIVE NEGATIVE Final    Comment: (NOTE) The Xpert Xpress SARS-CoV-2/FLU/RSV plus assay is intended as an aid in the diagnosis of influenza from Nasopharyngeal swab specimens and should not be used as a sole basis for treatment. Nasal washings and aspirates are unacceptable for Xpert Xpress SARS-CoV-2/FLU/RSV testing.  Fact Sheet for Patients: BloggerCourse.com  Fact Sheet for Healthcare Providers: SeriousBroker.it  This test is not yet approved or cleared by the [place] FDA and has been authorized for detection and/or diagnosis of SARS-CoV-2 by FDA under  an Emergency Use Authorization (EUA). This EUA will remain in effect (meaning this test can be used) for the duration of the COVID-19 declaration under Section 564(b)(1) of the Act, 21 U.S.C. section 360bbb-3(b)(1), unless the authorization is terminated or revoked.     Resp Syncytial Virus by PCR NEGATIVE NEGATIVE Final    Comment: (NOTE) Fact Sheet for Patients: BloggerCourse.com  Fact Sheet for Healthcare Providers: SeriousBroker.it  This test is not yet approved or cleared by the [place] FDA and has been authorized for detection and/or diagnosis of SARS-CoV-2 by FDA under an Emergency Use Authorization (EUA). This EUA will remain in effect (meaning this test can be used) for the duration of the COVID-19 declaration under Section 564(b)(1) of the Act, 21 U.S.C. section 360bbb-3(b)(1), unless the authorization is terminated or revoked.  Performed at [facility], [address], [place], [place] [address]   Culture, blood (routine x 2)     Status: Abnormal   Collection Time: 08/01/23  4:50 PM   Specimen: BLOOD  Result Value Ref Range Status   Specimen Description   Final    BLOOD LEFT ANTECUBITAL Performed at [facility], [address] [place], [place] [address]    Special Requests   Final    BOTTLES DRAWN AEROBIC AND ANAEROBIC Blood Culture results may not be optimal due to an inadequate volume of blood received in culture bottles Performed at [facility], [address] [place], [place] [address]    Culture  Setup Time   Final    GRAM POSITIVE COCCI ANAEROBIC BOTTLE ONLY CRITICAL RESULT CALLED TO, READ BACK BY AND VERIFIED WITH: PHARMD CHRISTINE SHADE ON 08/02/23 @ 1814 BY DRT GRAM POSITIVE RODS AEROBIC BOTTLE ONLY CRITICAL RESULT CALLED TO, READ BACK BY AND VERIFIED WITH: PHARMD CHRISTINE SHADE ON 08/02/23 @ 1817 BY DRT    Culture (A)  Final     ENTEROCOCCUS FAECALIS CORYNEBACTERIUM STRIATUM Standardized susceptibility testing for this organism is not available. Performed at [facility], [address] [place], [place] [address]    Report Status 08/04/2023 FINAL  Final   Organism ID, Bacteria ENTEROCOCCUS  FAECALIS  Final      Susceptibility   Enterococcus faecalis - MIC*    AMPICILLIN <=2 SENSITIVE Sensitive     VANCOMYCIN 1 SENSITIVE Sensitive     GENTAMICIN SYNERGY SENSITIVE Sensitive     * ENTEROCOCCUS FAECALIS  Blood Culture ID Panel (Reflexed)     Status: Abnormal   Collection Time: 08/01/23  4:50 PM  Result Value Ref Range Status   Enterococcus faecalis DETECTED (A) NOT DETECTED Final    Comment: CRITICAL RESULT CALLED TO, READ BACK BY AND VERIFIED WITH: PHARMD CHRISTINE SHADE ON 08/02/23 @ 1814 BY DRT    Enterococcus Faecium NOT DETECTED NOT DETECTED Final   Listeria monocytogenes NOT DETECTED NOT DETECTED Final   Staphylococcus species NOT DETECTED NOT DETECTED Final   Staphylococcus aureus (BCID) NOT DETECTED NOT DETECTED Final   Staphylococcus epidermidis NOT DETECTED NOT DETECTED Final   Staphylococcus lugdunensis NOT DETECTED NOT DETECTED Final   Streptococcus species NOT DETECTED NOT DETECTED Final   Streptococcus agalactiae NOT DETECTED NOT DETECTED Final   Streptococcus pneumoniae NOT DETECTED NOT DETECTED Final   Streptococcus pyogenes NOT DETECTED NOT DETECTED Final   A.calcoaceticus-baumannii NOT DETECTED NOT DETECTED Final   Bacteroides fragilis NOT DETECTED NOT DETECTED Final   Enterobacterales NOT DETECTED NOT DETECTED Final   Enterobacter cloacae complex NOT DETECTED NOT DETECTED Final   Escherichia coli NOT DETECTED NOT DETECTED Final   Klebsiella aerogenes NOT DETECTED NOT DETECTED Final   Klebsiella oxytoca NOT DETECTED NOT DETECTED Final   Klebsiella pneumoniae NOT DETECTED NOT DETECTED Final   Proteus species NOT DETECTED NOT DETECTED Final   Salmonella species NOT DETECTED NOT  DETECTED Final   Serratia marcescens NOT DETECTED NOT DETECTED Final   Haemophilus influenzae NOT DETECTED NOT DETECTED Final   Neisseria meningitidis NOT DETECTED NOT DETECTED Final   Pseudomonas aeruginosa NOT DETECTED NOT DETECTED Final   Stenotrophomonas maltophilia NOT DETECTED NOT DETECTED Final   Candida albicans NOT DETECTED NOT DETECTED Final   Candida auris NOT DETECTED NOT DETECTED Final   Candida glabrata NOT DETECTED NOT DETECTED Final   Candida krusei NOT DETECTED NOT DETECTED Final   Candida parapsilosis NOT DETECTED NOT DETECTED Final   Candida tropicalis NOT DETECTED NOT DETECTED Final   Cryptococcus neoformans/gattii NOT DETECTED NOT DETECTED Final   Vancomycin resistance NOT DETECTED NOT DETECTED Final    Comment: Performed at [facility], [address] [place], [place] [address]  Urine Culture     Status: Abnormal   Collection Time: 08/01/23  5:02 PM   Specimen: Urine, Random  Result Value Ref Range Status   Specimen Description   Final    URINE, RANDOM Performed at [facility], [address] [place], [place] [address]    Special Requests   Final    NONE Reflexed from [id number] Performed at [facility], [address], [place], [place] [address]    Culture (A)  Final    <10,000 COLONIES/mL INSIGNIFICANT GROWTH Performed at [facility], [address], [place], [place] [address]    Report Status 08/03/2023 FINAL  Final  Culture, blood (routine x 2)     Status: None (Preliminary result)   Collection Time: 08/01/23  5:09 PM   Specimen: BLOOD  Result Value Ref Range Status   Specimen Description   Final    BLOOD BLOOD RIGHT HAND Performed at [facility], [address] [place], [place] [address]    Special Requests  Final    BOTTLES DRAWN AEROBIC ONLY Blood Culture results may not be optimal due to an inadequate volume of blood received in culture bottles Performed at [facility], [address], [place], [place] [address]    Culture   Final    NO GROWTH 3 DAYS Performed at [facility], [address] [place], [place] [address]    Report Status PENDING  Incomplete  MRSA Next Gen by PCR, Nasal     Status: None   Collection Time: 08/01/23  9:15 PM   Specimen: Nasal Mucosa; Nasal Swab  Result Value Ref Range Status   MRSA by PCR Next Gen NOT DETECTED NOT DETECTED Final    Comment: (NOTE) The GeneXpert MRSA Assay (FDA approved for NASAL specimens only), is one component of a comprehensive MRSA colonization surveillance program. It is not intended to diagnose MRSA infection nor to guide or monitor treatment for MRSA infections. Test performance is not FDA approved in patients less than 17 years old. Performed at [facility], [address], [place], [place] [address]   Culture, blood (Routine X 2) w Reflex to ID Panel     Status: None (Preliminary result)   Collection Time: 08/03/23  3:03 AM   Specimen: BLOOD  Result Value Ref Range Status   Specimen Description   Final    BLOOD BLOOD RIGHT ARM Performed at [facility], [address], [place], [place] [address]    Special Requests   Final    BOTTLES DRAWN AEROBIC AND ANAEROBIC Blood Culture adequate volume Performed at [facility], [address], [place], [place] [address]    Culture   Final    NO GROWTH < 24 HOURS Performed at [facility], [address] [place], [place] [address]    Report Status PENDING  Incomplete  Culture, blood (Routine X 2) w Reflex to ID Panel     Status: None (Preliminary result)   Collection Time: 08/03/23  5:49 AM   Specimen: BLOOD RIGHT ARM  Result Value Ref Range Status   Specimen Description   Final    BLOOD RIGHT ARM Performed at [facility], [address] [place], [place] [address]    Special Requests   Final    BOTTLES DRAWN AEROBIC AND ANAEROBIC Blood Culture  results may not be optimal due to an inadequate volume of blood received in culture bottles Performed at [facility], [address] [place], [place] [address]    Culture   Final    NO GROWTH < 24 HOURS Performed at [facility], [address], [place], [place] [address]    Report Status PENDING  Incomplete         Radiology Studies: ECHOCARDIOGRAM COMPLETE Result Date: 08/03/2023    ECHOCARDIOGRAM REPORT   Patient Name:   RIM KLEINTOP Philley Date of Exam: 08/03/2023 Medical Rec #:  [id number]    Height:       62.0 in Accession #:    [id number]   Weight:       153.2 lb Date of Birth:  22-May-1946    BSA:          1.707 m Patient Age:    77 years     BP:           143/43 mmHg Patient Gender: F            HR:  80 bpm. Exam Location:  Inpatient Procedure: 2D Echo, Cardiac Doppler and Color Doppler Indications:    Bacteremia R78.81  History:        Patient has prior history of Echocardiogram examinations, most                 recent 07/16/2023. CHF, CAD, Arrythmias:Atrial Fibrillation and                 Atrial Flutter; Risk Factors:Hypertension and Diabetes.  Sonographer:    Darlys Gales Referring Phys: [phone] CORNELIUS N VAN DAM IMPRESSIONS  1. Left ventricular ejection fraction, by estimation, is 60 to 65%. The left ventricle has normal function. The left ventricle has no regional wall motion abnormalities. Left ventricular diastolic parameters are indeterminate. There is the interventricular septum is flattened in systole, consistent with right ventricular pressure overload.  2. Right ventricular systolic function is severely reduced. The right ventricular size is severely enlarged.  3. Right atrial size was moderately dilated.  4. The mitral valve is normal in structure. No evidence of mitral valve regurgitation. No evidence of mitral stenosis.  5. The aortic valve is tricuspid. There is mild calcification of the aortic valve. Aortic valve regurgitation is not visualized.  No aortic stenosis is present.  6. The inferior vena cava is dilated in size with >50% respiratory variability, suggesting right atrial pressure of 8 mmHg. FINDINGS  Left Ventricle: Left ventricular ejection fraction, by estimation, is 60 to 65%. The left ventricle has normal function. The left ventricle has no regional wall motion abnormalities. The left ventricular internal cavity size was normal in size. There is  no left ventricular hypertrophy. The interventricular septum is flattened in systole, consistent with right ventricular pressure overload. Left ventricular diastolic parameters are indeterminate. Right Ventricle: The right ventricular size is severely enlarged. No increase in right ventricular wall thickness. Right ventricular systolic function is severely reduced. Left Atrium: Left atrial size was normal in size. Right Atrium: Right atrial size was moderately dilated. Pericardium: There is no evidence of pericardial effusion. Mitral Valve: The mitral valve is normal in structure. No evidence of mitral valve regurgitation. No evidence of mitral valve stenosis. Tricuspid Valve: The tricuspid valve is normal in structure. Tricuspid valve regurgitation is mild . No evidence of tricuspid stenosis. Aortic Valve: The aortic valve is tricuspid. There is mild calcification of the aortic valve. Aortic valve regurgitation is not visualized. No aortic stenosis is present. Pulmonic Valve: The pulmonic valve was normal in structure. Pulmonic valve regurgitation is not visualized. No evidence of pulmonic stenosis. Aorta: The aortic root is normal in size and structure. Venous: The inferior vena cava is dilated in size with greater than 50% respiratory variability, suggesting right atrial pressure of 8 mmHg. IAS/Shunts: There is left bowing of the interatrial septum, suggestive of elevated right atrial pressure. No atrial level shunt detected by color flow Doppler.  LEFT VENTRICLE PLAX 2D LVIDd:         4.50 cm LVIDs:          2.80 cm LV PW:         0.80 cm LV IVS:        0.90 cm LVOT diam:     1.70 cm LVOT Area:     2.27 cm  RIGHT VENTRICLE RV Basal diam:  5.30 cm RV Mid diam:    5.50 cm TAPSE (M-mode): 0.6 cm LEFT ATRIUM             Index  RIGHT ATRIUM           Index LA Vol (A2C):   44.6 ml 26.13 ml/m  RA Area:     16.00 cm LA Vol (A4C):   43.4 ml 25.42 ml/m  RA Volume:   39.20 ml  22.96 ml/m LA Biplane Vol: 46.6 ml 27.30 ml/m   AORTA Ao Root diam: 2.30 cm TRICUSPID VALVE TR Peak grad:   36.0 mmHg TR Vmax:        300.00 cm/s  SHUNTS Systemic Diam: 1.70 cm Arvilla Meres MD Electronically signed by Arvilla Meres MD Signature Date/Time: 08/03/2023/3:03:11 PM    Final    DG Swallowing Func-Speech Pathology Result Date: 08/03/2023 Table formatting from the original result was not included. Modified Barium Swallow Study Patient Details Name: SENETRA MANFRE MRN: [id number] Date of Birth: 09-06-1945 Today's Date: 08/03/2023 HPI/PMH: HPI: Patient is a 77 y.o. female with PMH: CAD, h/o CABG 1996, COPD, HTN, HLD, hypothyroidism, DM-2, chronic respiratory failure on 3L, h/o breast and lung cancer. She was recently admitted 11/25-12/6/24 with anasarca, CHF exacerbation, a-fib with RVR. She was discharged to [facility]. She presented to hospital on 12/15 with SOB, wheezing and altered. In ER, she was  hypoxic at 86% on home 3L oxygen, HR 150. CT chest suggestive of PNA. She was placed on BiPAP. SLP swallow evaluation ordered secondary to patient c/o difficult with swallowing pills and RN observing patient to have instances of coughing when drinking liquids. Clinical Impression: Clinical Impression: Patient presents with a mild oral and pharyngoesophageal dysphagia as per this modified barium swallow study. During oral phase, she exhibited slow mastication and slow lingual movement, leading to delayed bolus transit from anterior to posterior portion of oral cavity. Swallow was initiated at level of vallecular sinus  with puree, mechanical soft solids and honey thick liquids, and at posterior laryngeal surface of the epiglottis and at times delayed to pyriform sinus with thin and nectar thick liquids. Anterior hyoid excursion, laryngeal elevation and epiglottic inversion all appeared complete. No significant amount of pharyngeal residuals remained after swallows and no instances of penetration or aspiration observed at any phase of the swallow. When taken with thin liquids, barium tablet become briefly lodged in vallecular sinus but transited with more sips of liquids and bites of puree. When camera panned down to view esophagus, barium tablet observed to be at upper thoracic level of esophagus. Sips of liquids and bites of puree helped to fully transit pill. No barium stasis observed with other barium consistencies but SLP questions reduced esophageal motility. (no radiologist present to confirm) SLP not recommending further skilled intervention but recommends patient take breaks while eating, alternate solids and liquids and avoid rapid PO consumption. RN notified of results and recommendations. Factors that may increase risk of adverse event in presence of aspiration Rubye Oaks [employer] Clearance Coots 2021): Factors that may increase risk of adverse event in presence of aspiration Rubye Oaks [employer] Clearance Coots 2021): Limited mobility; Poor general health and/or compromised immunity; Frail or deconditioned; Respiratory or GI disease Recommendations/Plan: Swallowing Evaluation Recommendations Swallowing Evaluation Recommendations Recommendations: PO diet PO Diet Recommendation: Regular; Thin liquids (Level 0) Liquid Administration via: Straw; Cup Medication Administration: Whole meds with liquid Supervision: Patient able to self-feed; Set-up assistance for safety Swallowing strategies  : Minimize environmental distractions; Slow rate Postural changes: Stay upright 30-60 min after meals; Position pt fully upright for meals Oral care recommendations: Oral  care BID (2x/day) Treatment Plan Treatment Plan Treatment recommendations: No treatment recommended at this time Follow-up recommendations:  No SLP follow up Functional status assessment: Patient has not had a recent decline in their functional status. Recommendations Recommendations for follow up therapy are one component of a multi-disciplinary discharge planning process, led by the attending physician.  Recommendations may be updated based on patient status, additional functional criteria and insurance authorization. Assessment: Orofacial Exam: Orofacial Exam Oral Cavity - Dentition: Adequate natural dentition Anatomy: Anatomy: WFL Boluses Administered: Boluses Administered Boluses Administered: Thin liquids (Level 0); Mildly thick liquids (Level 2, nectar thick); Moderately thick liquids (Level 3, honey thick); Puree; Solid  Oral Impairment Domain: Oral Impairment Domain Lip Closure: Escape from interlabial space or lateral juncture, no extension beyond vermillion border Tongue control during bolus hold: Not tested Bolus preparation/mastication: Slow prolonged chewing/mashing with complete recollection Bolus transport/lingual motion: Slow tongue motion Oral residue: Trace residue lining oral structures Location of oral residue : Tongue; Floor of mouth Initiation of pharyngeal swallow : Posterior laryngeal surface of the epiglottis; Pyriform sinuses  Pharyngeal Impairment Domain: Pharyngeal Impairment Domain Soft palate elevation: No bolus between soft palate (SP)/pharyngeal wall (PW) Laryngeal elevation: Complete superior movement of thyroid cartilage with complete approximation of arytenoids to epiglottic petiole Anterior hyoid excursion: Complete anterior movement Epiglottic movement: Complete inversion Laryngeal vestibule closure: Complete, no air/contrast in laryngeal vestibule Pharyngeal stripping wave : Present - complete Pharyngeal contraction (A/P view only): N/A Pharyngoesophageal segment opening:  Complete distension and complete duration, no obstruction of flow Tongue base retraction: No contrast between tongue base and posterior pharyngeal wall (PPW) Pharyngeal residue: Complete pharyngeal clearance Location of pharyngeal residue: N/A  Esophageal Impairment Domain: Esophageal Impairment Domain Esophageal clearance upright position: Complete clearance, esophageal coating Pill: Pill Consistency administered: Thin liquids (Level 0); Puree Thin liquids (Level 0): Impaired (see clinical impressions) Puree: Impaired (see clinical impressions) Penetration/Aspiration Scale Score: Penetration/Aspiration Scale Score 1.  Material does not enter airway: Thin liquids (Level 0); Mildly thick liquids (Level 2, nectar thick); Moderately thick liquids (Level 3, honey thick); Puree; Solid; Pill Compensatory Strategies: Compensatory Strategies Compensatory strategies: Yes Straw: Effective Effective Straw: Thin liquid (Level 0) Liquid wash: Effective Effective Liquid Wash: Pill; Moderately thick liquid (Level 3, honey thick)   General Information: No data recorded Diet Prior to this Study: Regular; Thin liquids (Level 0)   Temperature : Normal   Respiratory Status: WFL   Supplemental O2: Nasal cannula   History of Recent Intubation: No  Behavior/Cognition: Alert; Cooperative; Pleasant mood Self-Feeding Abilities: Able to self-feed Baseline vocal quality/speech: Normal Volitional Cough: Able to elicit Volitional Swallow: Able to elicit Exam Limitations: No limitations Goal Planning: No data recorded No data recorded No data recorded Patient/Family Stated Goal: patient reports h/o difficulty with larger pills Consulted and agree with results and recommendations: Patient; Nurse Pain: Pain Assessment Pain Assessment: No/denies pain Pain Score: 0 End of Session: Start Time:SLP Start Time (ACUTE ONLY): 1120 Stop Time: SLP Stop Time (ACUTE ONLY): 1135 Time Calculation:SLP Time Calculation (min) (ACUTE ONLY): 15 min Charges: SLP  Evaluations $ SLP Speech Visit: 1 Visit SLP Evaluations $BSS Swallow: 1 Procedure $MBS Swallow: 1 Procedure SLP visit diagnosis: SLP Visit Diagnosis: Dysphagia, pharyngoesophageal phase (R13.14); Dysphagia, oral phase (R13.11) Past Medical History: Past Medical History: Diagnosis Date  Acute exacerbation of CHF (congestive heart failure) (HCC) 07/12/2023  Arthritis   Breast cancer (HCC) dx'd 09/2015  COPD (chronic obstructive pulmonary disease) (HCC)   Coronary artery disease   Depression   Diabetes mellitus (HCC) 10/22/2015  Full dentures   full upper dentures, plate on bottom  History of bronchitis  History of pneumonia   Hyperlipidemia   Hypertension   Hypothyroidism 10/22/2015  lung ca dx;d 09/2015  lung cancer  Myocardial infarction [facility]) July 1995  Osteoporosis 10/22/2015  Personal history of radiation therapy   Stress incontinence   Uterine cancer (HCC) dx'd 1968 Past Surgical History: Past Surgical History: Procedure Laterality Date  ABDOMINAL HYSTERECTOMY    1968  APPENDECTOMY    with hysterectomy  BLADDER SURGERY    1972  BREAST BIOPSY Left 11/04/2015  Malignant  BREAST BIOPSY Left 11/04/2015  Malignant  BREAST LUMPECTOMY Left 11/19/2015  CARDIAC CATHETERIZATION  1995, 1996  Dr. 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  LUNG BIOPSY N/A 11/13/2015  Procedure: RIGHT LUNG BIOPSY;  Surgeon: Delight Ovens, MD;  Location: [facility] OR;  Service: Thoracic;  Laterality: N/A;  MULTIPLE TOOTH EXTRACTIONS    RADIOACTIVE SEED GUIDED PARTIAL MASTECTOMY WITH AXILLARY SENTINEL LYMPH NODE BIOPSY Left 11/19/2015  Procedure:  RADIOACTIVE SEED GUIDED PARTIAL MASTECTOMY WITH AXILLARY SENTINEL LYMPH NODE BIOPSY;  Surgeon: Manus Rudd, MD;  Location: [facility];  Service: General;  Laterality: Left;  RE-EXCISION OF BREAST LUMPECTOMY Left 12/03/2015  Procedure: RE-EXCISION INFERIOR MARGIN OF LEFT BREAST LUMPECTOMY;  Surgeon: Manus Rudd, MD;  Location: [facility] OR;  Service: General;  Laterality: Left;  TMJ ARTHROPLASTY    1979  TONSILLECTOMY    TRANSESOPHAGEAL ECHOCARDIOGRAM (CATH LAB) N/A 07/16/2023  Procedure: TRANSESOPHAGEAL ECHOCARDIOGRAM;  Surgeon: Sande Rives, MD;  Location: [facility] INVASIVE CV LAB;  Service: Cardiovascular;  Laterality: N/A;  VIDEO BRONCHOSCOPY WITH ENDOBRONCHIAL NAVIGATION N/A 11/13/2015  Procedure: VIDEO BRONCHOSCOPY WITH ENDOBRONCHIAL NAVIGATION, with placement of fudicial markers;  Surgeon: Delight Ovens, MD;  Location: [facility] OR;  Service: Thoracic;  Laterality: N/A; Angela Nevin, MA, CCC-SLP Speech Therapy           LOS: 3 days   Time spent= 35 mins    Miguel Rota, MD [facility]  If 7PM-7AM, please contact night-coverage  08/04/2023, 12:04 PM

## 2023-08-04 NOTE — Consult Note (Signed)
WOC Nurse Consult Note: Reason for Consult: Bilateral lower legs, Unna boots.  Changed weekly.  Right trochanter has chronic open wound  She states she has seen an orthopedic physician in the past but is unclear.  States she had an injection in the hip and an MRI.    Wound type: venous insufficiency Pressure Injury POA: NA Measurement: Right hip:  0.5 cm dark tissue with cracked center.  There is effluent noted on the bandage, but no drainage is noted coming from the wound.   Bilateral lower legs with scattered 0.1 cm scabbed areas.  Some edema to feet.  Wound bed: cracked, darkened  Drainage (amount, consistency, odor) see above, yellow effluent on bandage  Periwound: intact Dressing procedure/placement/frequency: No topical therapy for legs needed.  Will order Unna boots to be applied as well.   Foam dressing to right hip, change daily.  Will not follow at this time.  Please re-consult if needed.  Mike Gip MSN, RN, FNP-BC CWON Wound, Ostomy, Continence Nurse Outpatient [facility] [phone] Pager [phone]

## 2023-08-04 NOTE — Plan of Care (Signed)
  Problem: Coping: Goal: Ability to adjust to condition or change in health will improve Outcome: Progressing   Problem: Fluid Volume: Goal: Ability to maintain a balanced intake and output will improve Outcome: Progressing   Problem: Metabolic: Goal: Ability to maintain appropriate glucose levels will improve Outcome: Progressing   Problem: Skin Integrity: Goal: Risk for impaired skin integrity will decrease Outcome: Progressing   Problem: Tissue Perfusion: Goal: Adequacy of tissue perfusion will improve Outcome: Progressing   Problem: Clinical Measurements: Goal: Ability to maintain clinical measurements within normal limits will improve Outcome: Progressing

## 2023-08-04 NOTE — Progress Notes (Signed)
PT has poor effort at doing Dulera BID.

## 2023-08-04 NOTE — Progress Notes (Addendum)
Patient Name: Denise Mckenzie Date of Encounter: 08/04/2023 [facility] Cardiologist: Armanda Magic, MD   Interval Summary  .    Today she appears to be much better, back on home oxygen requirements 2 to 3 L.  No chest pain or shortness of breath.  Vital Signs .    Vitals:   08/04/23 0400 08/04/23 0500 08/04/23 0600 08/04/23 0700  BP: (!) 100/35 (!) 105/32 (!) 107/37 (!) 101/32  Pulse: (!) 59 (!) 59 60 83  Resp: 13 12 16 19   Temp: (!) 96.9 F (36.1 C)     TempSrc: Axillary     SpO2: 98% 98% 96% 94%  Weight:      Height:        Intake/Output Summary (Last 24 hours) at 08/04/2023 0759 Last data filed at 08/04/2023 6962 Gross per 24 hour  Intake 884.91 ml  Output 1350 ml  Net -465.09 ml      08/02/2023    6:39 PM 07/30/2023   10:48 PM 07/25/2023   10:42 PM  Last 3 Weights  Weight (lbs) 153 lb 3.5 oz 153 lb 3.5 oz 153 lb  Weight (kg) 69.5 kg 69.5 kg 69.4 kg      Telemetry/ECG    Atrial flutter heart rates in the 60s to 80s- Personally Reviewed  CV Studies    Echocardiogram 07/13/2023 1. Left ventricular ejection fraction, by estimation, is 60 to 65%. The  left ventricle has normal function. The left ventricle has no regional  wall motion abnormalities. Left ventricular diastolic parameters are  consistent with Grade I diastolic  dysfunction (impaired relaxation). There is the interventricular septum is  flattened in systole and diastole, consistent with right ventricular  pressure and volume overload.   2. Right ventricular systolic function is severely reduced. The right  ventricular size is moderately enlarged. There is moderately elevated  pulmonary artery systolic pressure. The estimated right ventricular  systolic pressure is 45.9 mmHg.   3. Right atrial size was severely dilated.   4. The mitral valve is normal in structure. Trivial mitral valve  regurgitation.   5. The aortic valve is normal in structure. There is mild calcification  of the  aortic valve. Aortic valve regurgitation is not visualized.   6. The inferior vena cava is dilated in size with <50% respiratory  variability, suggesting right atrial pressure of 15 mmHg.   Comparison(s): Changes from prior study are noted. New severe RV dilation  and dysfunction.     Physical Exam .   GEN: No acute distress.  supplemental oxygen 2 L Neck: No JVD Cardiac: Irregularly irregular  Respiratory: Poor respiratory effort,  GI: Soft, nontender, non-distended  MS: 1-2+ edema with wraps, improved  Patient Profile    Denise Mckenzie is a 77 y.o. female has hx of  new pulmonary hypertension, persistent atrial flutter DCCV 06/2023, CAD (CABG 1996), COPD, HTN, HLD, Breast/lung cancer, hypothyroidism, type 2 diabetes  and admitted on 08/01/2023 for the evaluation of pneumonia.  Cardiology asked to see for atrial flutter with worsening shortness of breath  Assessment & Plan .     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  D-shaped septum with severe RV dysfunction.  PASP 46, severe RAE.  Previous CTA negative for PE.  Due to recent cardioversion right heart cath unable to be performed.  Volume status appears to be okay, holds fluid peripherally.  Peripheral edema looks improved,  seems much more alert and with more energy today.  Potassium dropped to 3.0.  Will supplement, and provide dosing before next dose of torsemide. Currently on torsemide 40 mg twice daily, midodrine 5 mg 3 times daily.   Likely also needs outpatient sleep study as well as right heart cath eventually once more stable.   Persistent atrial flutter/fib She is very symptomatic.  Had TEE/DCCV on 07/16/2023 with successful conversion to sinus.  She converted back to flutter on 07/19/2023 and started on amio 400 mg twice daily.    Still maintaining atrial flutter with controlled ventricular rates 60-80 today.   Transitioned back to 200 mg daily.  No visible tremor today. Currently on amiodarone 400 mg twice daily, Cardizem 120 mg CD, digoxin 0.125 mg daily (digoxin level 1.4).  Again, amiodarone not ideal with underlying emphysema however limited options. Plan is to continue with amiodarone load and repeat DCCV.  This was started on 07/19/2023.  Continue amiodarone 200 mg daily Continue Eliquis 5 mg twice daily T4 .97, TSH .321  Pneumonia with acute on chronic respiratory failure with hypercapnia Enterococcus faecalis bacteremia COPD She is on Unasyn per infectious disease.  Surface echocardiogram did not show any valve vegetation.  Felt to be poor candidate for TEE, medically treat per ID.   CAD status post CABG 1996 Stable, no chest pain.  Continue statin    For questions or updates, please contact [facility] Please consult [email or web address] for contact info under        Signed, Abagail Kitchens, PA-C

## 2023-08-04 NOTE — Progress Notes (Signed)
Subjective: No new complaints   Antibiotics:  Anti-infectives (From admission, onward)    Start     Dose/Rate Route Frequency Ordered Stop   08/04/23 1200  ampicillin (OMNIPEN) 2 g in sodium chloride 0.9 % 100 mL IVPB        2 g 300 mL/hr over 20 Minutes Intravenous Every 4 hours 08/04/23 1022     08/03/23 0600  Ampicillin-Sulbactam (UNASYN) 3 g in sodium chloride 0.9 % 100 mL IVPB  Status:  Discontinued        3 g 200 mL/hr over 30 Minutes Intravenous Every 6 hours 08/02/23 2139 08/04/23 1022   08/02/23 1900  vancomycin (VANCOCIN) IVPB 1000 mg/200 mL premix  Status:  Discontinued        1,000 mg 200 mL/hr over 60 Minutes Intravenous Every 24 hours 08/01/23 2028 08/02/23 1325   08/02/23 0600  piperacillin-tazobactam (ZOSYN) IVPB 3.375 g  Status:  Discontinued        3.375 g 12.5 mL/hr over 240 Minutes Intravenous Every 8 hours 08/01/23 2028 08/02/23 2139   08/01/23 1830  Vancomycin (VANCOCIN) 1,500 mg in sodium chloride 0.9 % 500 mL IVPB        1,500 mg 250 mL/hr over 120 Minutes Intravenous  Once 08/01/23 1743 08/02/23 0848   08/01/23 1800  ceFEPIme (MAXIPIME) 2 g in sodium chloride 0.9 % 100 mL IVPB        2 g 200 mL/hr over 30 Minutes Intravenous  Once 08/01/23 1743 08/01/23 1849       Medications: Scheduled Meds:  amiodarone  200 mg Oral Daily   amitriptyline  50 mg Oral QHS   apixaban  5 mg Oral BID   Chlorhexidine Gluconate Cloth  6 each Topical Daily   digoxin  0.125 mg Oral Daily   diltiazem  120 mg Oral Daily   docusate sodium  100 mg Oral BID   insulin aspart  0-15 Units Subcutaneous Q4H   levothyroxine  88 mcg Oral QAC breakfast   midodrine  5 mg Oral TID WC   mometasone-formoterol  2 puff Inhalation BID   mouth rinse  15 mL Mouth Rinse 4 times per day   potassium chloride  40 mEq Oral Q4H   predniSONE  20 mg Oral Q breakfast   torsemide  40 mg Oral BID   Continuous Infusions:  ampicillin (OMNIPEN) IV     PRN Meds:.acetaminophen **OR**  acetaminophen, benzonatate, hydrALAZINE, levalbuterol, metoprolol tartrate, nitroGLYCERIN, ondansetron **OR** ondansetron (ZOFRAN) IV, mouth rinse, senna-docusate, traZODone    Objective: Weight change:   Intake/Output Summary (Last 24 hours) at 08/04/2023 1025 Last data filed at 08/04/2023 1025 Gross per 24 hour  Intake 1254.91 ml  Output 900 ml  Net 354.91 ml   Blood pressure (!) 105/49, pulse 81, temperature 97.6 F (36.4 C), temperature source Oral, resp. rate 19, height 5\' 2"  (1.575 m), weight 69.5 kg, SpO2 94%. Temp:  [96.9 F (36.1 C)-98.3 F (36.8 C)] 97.6 F (36.4 C) (12/18 0757) Pulse Rate:  [36-86] 81 (12/18 0933) Resp:  [12-25] 19 (12/18 0933) BP: (100-142)/(32-70) 105/49 (12/18 0943) SpO2:  [93 %-100 %] 94 % (12/18 0933) FiO2 (%):  [35 %] 35 % (12/18 0057)  Physical Exam: Physical Exam Constitutional:      General: She is not in acute distress.    Appearance: She is well-developed. She is obese. She is ill-appearing. She is not diaphoretic.  HENT:     Head: Normocephalic and atraumatic.  Right Ear: External ear normal.     Left Ear: External ear normal.     Mouth/Throat:     Pharynx: No oropharyngeal exudate.  Eyes:     General: No scleral icterus.    Conjunctiva/sclera: Conjunctivae normal.     Pupils: Pupils are equal, round, and reactive to light.  Cardiovascular:     Rate and Rhythm: Normal rate. Rhythm irregular.     Heart sounds: Normal heart sounds. No murmur heard.    No gallop.  Pulmonary:     Effort: Pulmonary effort is normal. No respiratory distress.     Breath sounds: Rhonchi present. No wheezing.  Abdominal:     Palpations: Abdomen is soft.  Musculoskeletal:        General: No tenderness. Normal range of motion.  Lymphadenopathy:     Cervical: No cervical adenopathy.  Skin:    General: Skin is warm and dry.     Coloration: Skin is not pale.     Findings: No erythema or rash.  Neurological:     General: No focal deficit present.      Mental Status: She is alert and oriented to person, place, and time.     Motor: No abnormal muscle tone.  Psychiatric:        Mood and Affect: Mood normal.        Behavior: Behavior normal.        Thought Content: Thought content normal.        Judgment: Judgment normal.     Legs wrapped  CBC:    BMET Recent Labs    08/03/23 0303 08/04/23 0259  NA 145 141  K 3.5 3.0*  CL 97* 94*  CO2 40* 39*  GLUCOSE 93 71  BUN 45* 33*  CREATININE 0.75 0.63  CALCIUM 7.8* 7.2*     Liver Panel  Recent Labs    08/01/23 1650  PROT 5.0*  ALBUMIN 2.5*  AST 47*  ALT 52*  ALKPHOS 86  BILITOT 0.7       Sedimentation Rate No results for input(s): "ESRSEDRATE" in the last 72 hours. C-Reactive Protein No results for input(s): "CRP" in the last 72 hours.  Micro Results: Recent Results (from the past 720 hours)  Resp panel by RT-PCR (RSV, Flu A&B, Covid) Anterior Nasal Swab     Status: None   Collection Time: 07/12/23  9:52 PM   Specimen: Anterior Nasal Swab  Result Value Ref Range Status   SARS Coronavirus 2 by RT PCR NEGATIVE NEGATIVE Final   Influenza A by PCR NEGATIVE NEGATIVE Final   Influenza B by PCR NEGATIVE NEGATIVE Final    Comment: (NOTE) The Xpert Xpress SARS-CoV-2/FLU/RSV plus assay is intended as an aid in the diagnosis of influenza from Nasopharyngeal swab specimens and should not be used as a sole basis for treatment. Nasal washings and aspirates are unacceptable for Xpert Xpress SARS-CoV-2/FLU/RSV testing.  Fact Sheet for Patients: BloggerCourse.com  Fact Sheet for Healthcare Providers: SeriousBroker.it  This test is not yet approved or cleared by the [place] FDA and has been authorized for detection and/or diagnosis of SARS-CoV-2 by FDA under an Emergency Use Authorization (EUA). This EUA will remain in effect (meaning this test can be used) for the duration of the COVID-19 declaration under  Section 564(b)(1) of the Act, 21 U.S.C. section 360bbb-3(b)(1), unless the authorization is terminated or revoked.     Resp Syncytial Virus by PCR NEGATIVE NEGATIVE Final    Comment: (NOTE) Fact Sheet for Patients:  BloggerCourse.com  Fact Sheet for Healthcare Providers: SeriousBroker.it  This test is not yet approved or cleared by the [place] FDA and has been authorized for detection and/or diagnosis of SARS-CoV-2 by FDA under an Emergency Use Authorization (EUA). This EUA will remain in effect (meaning this test can be used) for the duration of the COVID-19 declaration under Section 564(b)(1) of the Act, 21 U.S.C. section 360bbb-3(b)(1), unless the authorization is terminated or revoked.  Performed at [facility], [address] [place], [place] [address]   Culture, blood (Routine X 2) w Reflex to ID Panel     Status: None   Collection Time: 07/13/23 12:43 AM   Specimen: BLOOD RIGHT HAND  Result Value Ref Range Status   Specimen Description BLOOD RIGHT HAND  Final   Special Requests   Final    BOTTLES DRAWN AEROBIC AND ANAEROBIC Blood Culture adequate volume   Culture   Final    NO GROWTH 5 DAYS Performed at [facility], [address], [place], [place] [address]    Report Status 07/18/2023 FINAL  Final  Culture, blood (Routine X 2) w Reflex to ID Panel     Status: None   Collection Time: 07/13/23  3:37 AM   Specimen: BLOOD RIGHT HAND  Result Value Ref Range Status   Specimen Description BLOOD RIGHT HAND  Final   Special Requests   Final    BOTTLES DRAWN AEROBIC AND ANAEROBIC Blood Culture adequate volume   Culture   Final    NO GROWTH 5 DAYS Performed at [facility], [address], [place], [place] [address]    Report Status 07/18/2023 FINAL  Final  MRSA Next Gen by PCR, Nasal     Status: None   Collection Time: 07/14/23 12:30 PM   Specimen: Nasal Mucosa; Nasal Swab  Result Value Ref  Range Status   MRSA by PCR Next Gen NOT DETECTED NOT DETECTED Final    Comment: (NOTE) The GeneXpert MRSA Assay (FDA approved for NASAL specimens only), is one component of a comprehensive MRSA colonization surveillance program. It is not intended to diagnose MRSA infection nor to guide or monitor treatment for MRSA infections. Test performance is not FDA approved in patients less than 52 years old. Performed at [facility], [address], [place], [place] [address]   Resp panel by RT-PCR (RSV, Flu A&B, Covid) Anterior Nasal Swab     Status: None   Collection Time: 07/30/23  9:26 PM   Specimen: Anterior Nasal Swab  Result Value Ref Range Status   SARS Coronavirus 2 by RT PCR NEGATIVE NEGATIVE Final   Influenza A by PCR NEGATIVE NEGATIVE Final   Influenza B by PCR NEGATIVE NEGATIVE Final    Comment: (NOTE) The Xpert Xpress SARS-CoV-2/FLU/RSV plus assay is intended as an aid in the diagnosis of influenza from Nasopharyngeal swab specimens and should not be used as a sole basis for treatment. Nasal washings and aspirates are unacceptable for Xpert Xpress SARS-CoV-2/FLU/RSV testing.  Fact Sheet for Patients: BloggerCourse.com  Fact Sheet for Healthcare Providers: SeriousBroker.it  This test is not yet approved or cleared by the [place] FDA and has been authorized for detection and/or diagnosis of SARS-CoV-2 by FDA under an Emergency Use Authorization (EUA). This EUA will remain in effect (meaning this test can be used) for the duration of the COVID-19 declaration under Section 564(b)(1) of the Act, 21 U.S.C. section 360bbb-3(b)(1), unless the authorization is terminated or revoked.  Resp Syncytial Virus by PCR NEGATIVE NEGATIVE Final    Comment: (NOTE) Fact Sheet for Patients: BloggerCourse.com  Fact Sheet for Healthcare Providers: SeriousBroker.it  This test  is not yet approved or cleared by the [place] FDA and has been authorized for detection and/or diagnosis of SARS-CoV-2 by FDA under an Emergency Use Authorization (EUA). This EUA will remain in effect (meaning this test can be used) for the duration of the COVID-19 declaration under Section 564(b)(1) of the Act, 21 U.S.C. section 360bbb-3(b)(1), unless the authorization is terminated or revoked.  Performed at [facility], [address] [place], [place] [address]   Resp panel by RT-PCR (RSV, Flu A&B, Covid) Anterior Nasal Swab     Status: None   Collection Time: 08/01/23  4:43 PM   Specimen: Anterior Nasal Swab  Result Value Ref Range Status   SARS Coronavirus 2 by RT PCR NEGATIVE NEGATIVE Final    Comment: (NOTE) SARS-CoV-2 target nucleic acids are NOT DETECTED.  The SARS-CoV-2 RNA is generally detectable in upper respiratory specimens during the acute phase of infection. The lowest concentration of SARS-CoV-2 viral copies this assay can detect is 138 copies/mL. A negative result does not preclude SARS-Cov-2 infection and should not be used as the sole basis for treatment or other patient management decisions. A negative result may occur with  improper specimen collection/handling, submission of specimen other than nasopharyngeal swab, presence of viral mutation(s) within the areas targeted by this assay, and inadequate number of viral copies(<138 copies/mL). A negative result must be combined with clinical observations, patient history, and epidemiological information. The expected result is Negative.  Fact Sheet for Patients:  BloggerCourse.com  Fact Sheet for Healthcare Providers:  SeriousBroker.it  This test is no t yet approved or cleared by the [place] FDA and  has been authorized for detection and/or diagnosis of SARS-CoV-2 by FDA under an Emergency Use Authorization (EUA). This EUA will remain  in  effect (meaning this test can be used) for the duration of the COVID-19 declaration under Section 564(b)(1) of the Act, 21 U.S.C.section 360bbb-3(b)(1), unless the authorization is terminated  or revoked sooner.       Influenza A by PCR NEGATIVE NEGATIVE Final   Influenza B by PCR NEGATIVE NEGATIVE Final    Comment: (NOTE) The Xpert Xpress SARS-CoV-2/FLU/RSV plus assay is intended as an aid in the diagnosis of influenza from Nasopharyngeal swab specimens and should not be used as a sole basis for treatment. Nasal washings and aspirates are unacceptable for Xpert Xpress SARS-CoV-2/FLU/RSV testing.  Fact Sheet for Patients: BloggerCourse.com  Fact Sheet for Healthcare Providers: SeriousBroker.it  This test is not yet approved or cleared by the [place] FDA and has been authorized for detection and/or diagnosis of SARS-CoV-2 by FDA under an Emergency Use Authorization (EUA). This EUA will remain in effect (meaning this test can be used) for the duration of the COVID-19 declaration under Section 564(b)(1) of the Act, 21 U.S.C. section 360bbb-3(b)(1), unless the authorization is terminated or revoked.     Resp Syncytial Virus by PCR NEGATIVE NEGATIVE Final    Comment: (NOTE) Fact Sheet for Patients: BloggerCourse.com  Fact Sheet for Healthcare Providers: SeriousBroker.it  This test is not yet approved or cleared by the [place] FDA and has been authorized for detection and/or diagnosis of SARS-CoV-2 by FDA under an Emergency Use Authorization (EUA). This EUA will remain in effect (meaning this test can be used) for the duration of the COVID-19 declaration under Section 564(b)(1)  of the Act, 21 U.S.C. section 360bbb-3(b)(1), unless the authorization is terminated or revoked.  Performed at [facility], [address] [place], [place] [address]    Culture, blood (routine x 2)     Status: Abnormal   Collection Time: 08/01/23  4:50 PM   Specimen: BLOOD  Result Value Ref Range Status   Specimen Description   Final    BLOOD LEFT ANTECUBITAL Performed at [facility], [address], [place], [place] [address]    Special Requests   Final    BOTTLES DRAWN AEROBIC AND ANAEROBIC Blood Culture results may not be optimal due to an inadequate volume of blood received in culture bottles Performed at [facility], [address], [place], [place] [address]    Culture  Setup Time   Final    GRAM POSITIVE COCCI ANAEROBIC BOTTLE ONLY CRITICAL RESULT CALLED TO, READ BACK BY AND VERIFIED WITH: PHARMD CHRISTINE SHADE ON 08/02/23 @ 1814 BY DRT GRAM POSITIVE RODS AEROBIC BOTTLE ONLY CRITICAL RESULT CALLED TO, READ BACK BY AND VERIFIED WITH: PHARMD CHRISTINE SHADE ON 08/02/23 @ 1817 BY DRT    Culture (A)  Final    ENTEROCOCCUS FAECALIS CORYNEBACTERIUM STRIATUM Standardized susceptibility testing for this organism is not available. Performed at [facility], [address] [place], [place] [address]    Report Status 08/04/2023 FINAL  Final   Organism ID, Bacteria ENTEROCOCCUS FAECALIS  Final      Susceptibility   Enterococcus faecalis - MIC*    AMPICILLIN <=2 SENSITIVE Sensitive     VANCOMYCIN 1 SENSITIVE Sensitive     GENTAMICIN SYNERGY SENSITIVE Sensitive     * ENTEROCOCCUS FAECALIS  Blood Culture ID Panel (Reflexed)     Status: Abnormal   Collection Time: 08/01/23  4:50 PM  Result Value Ref Range Status   Enterococcus faecalis DETECTED (A) NOT DETECTED Final    Comment: CRITICAL RESULT CALLED TO, READ BACK BY AND VERIFIED WITH: PHARMD CHRISTINE SHADE ON 08/02/23 @ 1814 BY DRT    Enterococcus Faecium NOT DETECTED NOT DETECTED Final   Listeria monocytogenes NOT DETECTED NOT DETECTED Final   Staphylococcus species NOT DETECTED NOT DETECTED Final   Staphylococcus aureus (BCID) NOT DETECTED NOT  DETECTED Final   Staphylococcus epidermidis NOT DETECTED NOT DETECTED Final   Staphylococcus lugdunensis NOT DETECTED NOT DETECTED Final   Streptococcus species NOT DETECTED NOT DETECTED Final   Streptococcus agalactiae NOT DETECTED NOT DETECTED Final   Streptococcus pneumoniae NOT DETECTED NOT DETECTED Final   Streptococcus pyogenes NOT DETECTED NOT DETECTED Final   A.calcoaceticus-baumannii NOT DETECTED NOT DETECTED Final   Bacteroides fragilis NOT DETECTED NOT DETECTED Final   Enterobacterales NOT DETECTED NOT DETECTED Final   Enterobacter cloacae complex NOT DETECTED NOT DETECTED Final   Escherichia coli NOT DETECTED NOT DETECTED Final   Klebsiella aerogenes NOT DETECTED NOT DETECTED Final   Klebsiella oxytoca NOT DETECTED NOT DETECTED Final   Klebsiella pneumoniae NOT DETECTED NOT DETECTED Final   Proteus species NOT DETECTED NOT DETECTED Final   Salmonella species NOT DETECTED NOT DETECTED Final   Serratia marcescens NOT DETECTED NOT DETECTED Final   Haemophilus influenzae NOT DETECTED NOT DETECTED Final   Neisseria meningitidis NOT DETECTED NOT DETECTED Final   Pseudomonas aeruginosa NOT DETECTED NOT DETECTED Final   Stenotrophomonas maltophilia NOT DETECTED NOT DETECTED Final   Candida albicans NOT DETECTED NOT DETECTED Final   Candida auris NOT DETECTED NOT DETECTED Final   Candida glabrata NOT  DETECTED NOT DETECTED Final   Candida krusei NOT DETECTED NOT DETECTED Final   Candida parapsilosis NOT DETECTED NOT DETECTED Final   Candida tropicalis NOT DETECTED NOT DETECTED Final   Cryptococcus neoformans/gattii NOT DETECTED NOT DETECTED Final   Vancomycin resistance NOT DETECTED NOT DETECTED Final    Comment: Performed at [facility], [address], [place], [place] [address]  Urine Culture     Status: Abnormal   Collection Time: 08/01/23  5:02 PM   Specimen: Urine, Random  Result Value Ref Range Status   Specimen Description   Final    URINE, RANDOM Performed at  [facility], [address], [place], [place] [address]    Special Requests   Final    NONE Reflexed from [id number] Performed at [facility], [address], [place], [place] [address]    Culture (A)  Final    <10,000 COLONIES/mL INSIGNIFICANT GROWTH Performed at [facility], [address] [place], [place] [address]    Report Status 08/03/2023 FINAL  Final  Culture, blood (routine x 2)     Status: None (Preliminary result)   Collection Time: 08/01/23  5:09 PM   Specimen: BLOOD  Result Value Ref Range Status   Specimen Description   Final    BLOOD BLOOD RIGHT HAND Performed at [facility], [address], [place], [place] [address]    Special Requests   Final    BOTTLES DRAWN AEROBIC ONLY Blood Culture results may not be optimal due to an inadequate volume of blood received in culture bottles Performed at [facility], [address], [place], [place] [address]    Culture   Final    NO GROWTH 3 DAYS Performed at [facility], [address] [place], [place] [address]    Report Status PENDING  Incomplete  MRSA Next Gen by PCR, Nasal     Status: None   Collection Time: 08/01/23  9:15 PM   Specimen: Nasal Mucosa; Nasal Swab  Result Value Ref Range Status   MRSA by PCR Next Gen NOT DETECTED NOT DETECTED Final    Comment: (NOTE) The GeneXpert MRSA Assay (FDA approved for NASAL specimens only), is one component of a comprehensive MRSA colonization surveillance program. It is not intended to diagnose MRSA infection nor to guide or monitor treatment for MRSA infections. Test performance is not FDA approved in patients less than 42 years old. Performed at [facility], [address] [place], [place] [address]   Culture, blood (Routine X 2) w Reflex to ID Panel     Status: None (Preliminary result)   Collection Time: 08/03/23  3:03 AM   Specimen: BLOOD  Result Value Ref  Range Status   Specimen Description   Final    BLOOD BLOOD RIGHT ARM Performed at [facility], [address] [place], [place] [address]    Special Requests   Final    BOTTLES DRAWN AEROBIC AND ANAEROBIC Blood Culture adequate volume Performed at [facility], [address] [place], [place] [address]    Culture   Final    NO GROWTH < 24 HOURS Performed at [facility], [address], [place], [place] [address]    Report Status PENDING  Incomplete  Culture, blood (Routine X 2) w Reflex to ID Panel     Status: None (Preliminary result)   Collection Time: 08/03/23  5:49 AM  Specimen: BLOOD RIGHT ARM  Result Value Ref Range Status   Specimen Description   Final    BLOOD RIGHT ARM Performed at [facility], [address], [place], [place] [address]    Special Requests   Final    BOTTLES DRAWN AEROBIC AND ANAEROBIC Blood Culture results may not be optimal due to an inadequate volume of blood received in culture bottles Performed at [facility], [address], [place], [place] [address]    Culture   Final    NO GROWTH < 24 HOURS Performed at [facility], [address] [place], [place] [address]    Report Status PENDING  Incomplete    Studies/Results: ECHOCARDIOGRAM COMPLETE Result Date: 08/03/2023    ECHOCARDIOGRAM REPORT   Patient Name:   LIAN VAUX Lacount Date of Exam: 08/03/2023 Medical Rec #:  [id number]    Height:       62.0 in Accession #:    [id number]   Weight:       153.2 lb Date of Birth:  [birth year]    BSA:          1.707 m Patient Age:    77 years     BP:           143/43 mmHg Patient Gender: F            HR:           80 bpm. Exam Location:  Inpatient Procedure: 2D Echo, Cardiac Doppler and Color Doppler Indications:    Bacteremia R78.81  History:        Patient has prior history of Echocardiogram examinations, most                 recent 07/16/2023. CHF, CAD, Arrythmias:Atrial Fibrillation and                  Atrial Flutter; Risk Factors:Hypertension and Diabetes.  Sonographer:    Darlys Gales Referring Phys: [id number] Arvell Pulsifer N VAN DAM IMPRESSIONS  1. Left ventricular ejection fraction, by estimation, is 60 to 65%. The left ventricle has normal function. The left ventricle has no regional wall motion abnormalities. Left ventricular diastolic parameters are indeterminate. There is the interventricular septum is flattened in systole, consistent with right ventricular pressure overload.  2. Right ventricular systolic function is severely reduced. The right ventricular size is severely enlarged.  3. Right atrial size was moderately dilated.  4. The mitral valve is normal in structure. No evidence of mitral valve regurgitation. No evidence of mitral stenosis.  5. The aortic valve is tricuspid. There is mild calcification of the aortic valve. Aortic valve regurgitation is not visualized. No aortic stenosis is present.  6. The inferior vena cava is dilated in size with >50% respiratory variability, suggesting right atrial pressure of 8 mmHg. FINDINGS  Left Ventricle: Left ventricular ejection fraction, by estimation, is 60 to 65%. The left ventricle has normal function. The left ventricle has no regional wall motion abnormalities. The left ventricular internal cavity size was normal in size. There is  no left ventricular hypertrophy. The interventricular septum is flattened in systole, consistent with right ventricular pressure overload. Left ventricular diastolic parameters are indeterminate. Right Ventricle: The right ventricular size is severely enlarged. No increase in right ventricular wall thickness. Right ventricular systolic function is severely reduced. Left Atrium: Left atrial size was normal in size. Right Atrium: Right atrial size was moderately dilated. Pericardium: There is no evidence of pericardial effusion. Mitral  Valve: The mitral valve is normal in structure. No evidence of mitral valve  regurgitation. No evidence of mitral valve stenosis. Tricuspid Valve: The tricuspid valve is normal in structure. Tricuspid valve regurgitation is mild . No evidence of tricuspid stenosis. Aortic Valve: The aortic valve is tricuspid. There is mild calcification of the aortic valve. Aortic valve regurgitation is not visualized. No aortic stenosis is present. Pulmonic Valve: The pulmonic valve was normal in structure. Pulmonic valve regurgitation is not visualized. No evidence of pulmonic stenosis. Aorta: The aortic root is normal in size and structure. Venous: The inferior vena cava is dilated in size with greater than 50% respiratory variability, suggesting right atrial pressure of 8 mmHg. IAS/Shunts: There is left bowing of the interatrial septum, suggestive of elevated right atrial pressure. No atrial level shunt detected by color flow Doppler.  LEFT VENTRICLE PLAX 2D LVIDd:         4.50 cm LVIDs:         2.80 cm LV PW:         0.80 cm LV IVS:        0.90 cm LVOT diam:     1.70 cm LVOT Area:     2.27 cm  RIGHT VENTRICLE RV Basal diam:  5.30 cm RV Mid diam:    5.50 cm TAPSE (M-mode): 0.6 cm LEFT ATRIUM             Index        RIGHT ATRIUM           Index LA Vol (A2C):   44.6 ml 26.13 ml/m  RA Area:     16.00 cm LA Vol (A4C):   43.4 ml 25.42 ml/m  RA Volume:   39.20 ml  22.96 ml/m LA Biplane Vol: 46.6 ml 27.30 ml/m   AORTA Ao Root diam: 2.30 cm TRICUSPID VALVE TR Peak grad:   36.0 mmHg TR Vmax:        300.00 cm/s  SHUNTS Systemic Diam: 1.70 cm Arvilla Meres MD Electronically signed by Arvilla Meres MD Signature Date/Time: 08/03/2023/3:03:11 PM    Final    DG Swallowing Func-Speech Pathology Result Date: 08/03/2023 Table formatting from the original result was not included. Modified Barium Swallow Study Patient Details Name: Denise Mckenzie MRN: [id number] Date of Birth: Jan 30, 1946 Today's Date: 08/03/2023 HPI/PMH: HPI: Patient is a 77 y.o. female with PMH: CAD, h/o CABG 1996, COPD, HTN, HLD,  hypothyroidism, DM-2, chronic respiratory failure on 3L, h/o breast and lung cancer. She was recently admitted 11/25-12/6/24 with anasarca, CHF exacerbation, a-fib with RVR. She was discharged to [facility]. She presented to hospital on 12/15 with SOB, wheezing and altered. In ER, she was  hypoxic at 86% on home 3L oxygen, HR 150. CT chest suggestive of PNA. She was placed on BiPAP. SLP swallow evaluation ordered secondary to patient c/o difficult with swallowing pills and RN observing patient to have instances of coughing when drinking liquids. Clinical Impression: Clinical Impression: Patient presents with a mild oral and pharyngoesophageal dysphagia as per this modified barium swallow study. During oral phase, she exhibited slow mastication and slow lingual movement, leading to delayed bolus transit from anterior to posterior portion of oral cavity. Swallow was initiated at level of vallecular sinus with puree, mechanical soft solids and honey thick liquids, and at posterior laryngeal surface of the epiglottis and at times delayed to pyriform sinus with thin and nectar thick liquids. Anterior hyoid excursion, laryngeal elevation and epiglottic inversion all appeared complete. No significant  amount of pharyngeal residuals remained after swallows and no instances of penetration or aspiration observed at any phase of the swallow. When taken with thin liquids, barium tablet become briefly lodged in vallecular sinus but transited with more sips of liquids and bites of puree. When camera panned down to view esophagus, barium tablet observed to be at upper thoracic level of esophagus. Sips of liquids and bites of puree helped to fully transit pill. No barium stasis observed with other barium consistencies but SLP questions reduced esophageal motility. (no radiologist present to confirm) SLP not recommending further skilled intervention but recommends patient take breaks while eating, alternate solids and liquids  and avoid rapid PO consumption. RN notified of results and recommendations. Factors that may increase risk of adverse event in presence of aspiration Rubye Oaks [employer] Clearance Coots 2021): Factors that may increase risk of adverse event in presence of aspiration Rubye Oaks [employer] Clearance Coots 2021): Limited mobility; Poor general health and/or compromised immunity; Frail or deconditioned; Respiratory or GI disease Recommendations/Plan: Swallowing Evaluation Recommendations Swallowing Evaluation Recommendations Recommendations: PO diet PO Diet Recommendation: Regular; Thin liquids (Level 0) Liquid Administration via: Straw; Cup Medication Administration: Whole meds with liquid Supervision: Patient able to self-feed; Set-up assistance for safety Swallowing strategies  : Minimize environmental distractions; Slow rate Postural changes: Stay upright 30-60 min after meals; Position pt fully upright for meals Oral care recommendations: Oral care BID (2x/day) Treatment Plan Treatment Plan Treatment recommendations: No treatment recommended at this time Follow-up recommendations: No SLP follow up Functional status assessment: Patient has not had a recent decline in their functional status. Recommendations Recommendations for follow up therapy are one component of a multi-disciplinary discharge planning process, led by the attending physician.  Recommendations may be updated based on patient status, additional functional criteria and insurance authorization. Assessment: Orofacial Exam: Orofacial Exam Oral Cavity - Dentition: Adequate natural dentition Anatomy: Anatomy: WFL Boluses Administered: Boluses Administered Boluses Administered: Thin liquids (Level 0); Mildly thick liquids (Level 2, nectar thick); Moderately thick liquids (Level 3, honey thick); Puree; Solid  Oral Impairment Domain: Oral Impairment Domain Lip Closure: Escape from interlabial space or lateral juncture, no extension beyond vermillion border Tongue control during bolus hold: Not  tested Bolus preparation/mastication: Slow prolonged chewing/mashing with complete recollection Bolus transport/lingual motion: Slow tongue motion Oral residue: Trace residue lining oral structures Location of oral residue : Tongue; Floor of mouth Initiation of pharyngeal swallow : Posterior laryngeal surface of the epiglottis; Pyriform sinuses  Pharyngeal Impairment Domain: Pharyngeal Impairment Domain Soft palate elevation: No bolus between soft palate (SP)/pharyngeal wall (PW) Laryngeal elevation: Complete superior movement of thyroid cartilage with complete approximation of arytenoids to epiglottic petiole Anterior hyoid excursion: Complete anterior movement Epiglottic movement: Complete inversion Laryngeal vestibule closure: Complete, no air/contrast in laryngeal vestibule Pharyngeal stripping wave : Present - complete Pharyngeal contraction (A/P view only): N/A Pharyngoesophageal segment opening: Complete distension and complete duration, no obstruction of flow Tongue base retraction: No contrast between tongue base and posterior pharyngeal wall (PPW) Pharyngeal residue: Complete pharyngeal clearance Location of pharyngeal residue: N/A  Esophageal Impairment Domain: Esophageal Impairment Domain Esophageal clearance upright position: Complete clearance, esophageal coating Pill: Pill Consistency administered: Thin liquids (Level 0); Puree Thin liquids (Level 0): Impaired (see clinical impressions) Puree: Impaired (see clinical impressions) Penetration/Aspiration Scale Score: Penetration/Aspiration Scale Score 1.  Material does not enter airway: Thin liquids (Level 0); Mildly thick liquids (Level 2, nectar thick); Moderately thick liquids (Level 3, honey thick); Puree; Solid; Pill Compensatory Strategies: Compensatory Strategies Compensatory strategies: Yes Straw: Effective Effective  Straw: Thin liquid (Level 0) Liquid wash: Effective Effective Liquid Wash: Pill; Moderately thick liquid (Level 3, honey thick)    General Information: No data recorded Diet Prior to this Study: Regular; Thin liquids (Level 0)   Temperature : Normal   Respiratory Status: WFL   Supplemental O2: Nasal cannula   History of Recent Intubation: No  Behavior/Cognition: Alert; Cooperative; Pleasant mood Self-Feeding Abilities: Able to self-feed Baseline vocal quality/speech: Normal Volitional Cough: Able to elicit Volitional Swallow: Able to elicit Exam Limitations: No limitations Goal Planning: No data recorded No data recorded No data recorded Patient/Family Stated Goal: patient reports h/o difficulty with larger pills Consulted and agree with results and recommendations: Patient; Nurse Pain: Pain Assessment Pain Assessment: No/denies pain Pain Score: 0 End of Session: Start Time:SLP Start Time (ACUTE ONLY): 1120 Stop Time: SLP Stop Time (ACUTE ONLY): 1135 Time Calculation:SLP Time Calculation (min) (ACUTE ONLY): 15 min Charges: SLP Evaluations $ SLP Speech Visit: 1 Visit SLP Evaluations $BSS Swallow: 1 Procedure $MBS Swallow: 1 Procedure SLP visit diagnosis: SLP Visit Diagnosis: Dysphagia, pharyngoesophageal phase (R13.14); Dysphagia, oral phase (R13.11) Past Medical History: Past Medical History: Diagnosis Date  Acute exacerbation of CHF (congestive heart failure) (HCC) 07/12/2023  Arthritis   Breast cancer (HCC) dx'd 09/2015  COPD (chronic obstructive pulmonary disease) (HCC)   Coronary artery disease   Depression   Diabetes mellitus (HCC) 10/22/2015  Full dentures   full upper dentures, plate on bottom  History of bronchitis   History of pneumonia   Hyperlipidemia   Hypertension   Hypothyroidism 10/22/2015  lung ca dx;d 09/2015  lung cancer  Myocardial infarction [facility]) July 1995  Osteoporosis 10/22/2015  Personal history of radiation therapy   Stress incontinence   Uterine cancer (HCC) dx'd 1968 Past Surgical History: Past Surgical History: Procedure Laterality Date  ABDOMINAL HYSTERECTOMY    1968  APPENDECTOMY    with hysterectomy  BLADDER SURGERY     1972  BREAST BIOPSY Left 11/04/2015  Malignant  BREAST BIOPSY Left 11/04/2015  Malignant  BREAST LUMPECTOMY Left 11/19/2015  CARDIAC CATHETERIZATION  1995, 1996  Dr. 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  LUNG BIOPSY N/A 11/13/2015  Procedure: RIGHT LUNG BIOPSY;  Surgeon: Delight Ovens, MD;  Location: [facility] OR;  Service: Thoracic;  Laterality: N/A;  MULTIPLE TOOTH EXTRACTIONS    RADIOACTIVE SEED GUIDED PARTIAL MASTECTOMY WITH AXILLARY SENTINEL LYMPH NODE BIOPSY Left 11/19/2015  Procedure: RADIOACTIVE SEED GUIDED PARTIAL MASTECTOMY WITH AXILLARY SENTINEL LYMPH NODE BIOPSY;  Surgeon: Manus Rudd, MD;  Location: [facility];  Service: General;  Laterality: Left;  RE-EXCISION OF BREAST LUMPECTOMY Left 12/03/2015  Procedure: RE-EXCISION INFERIOR MARGIN OF LEFT BREAST LUMPECTOMY;  Surgeon: Manus Rudd, MD;  Location: [facility] OR;  Service: General;  Laterality: Left;  TMJ ARTHROPLASTY    1979  TONSILLECTOMY    TRANSESOPHAGEAL ECHOCARDIOGRAM (CATH LAB) N/A 07/16/2023  Procedure: TRANSESOPHAGEAL ECHOCARDIOGRAM;  Surgeon: Sande Rives, MD;  Location: [facility] INVASIVE CV LAB;  Service: Cardiovascular;  Laterality: N/A;  VIDEO BRONCHOSCOPY WITH ENDOBRONCHIAL NAVIGATION N/A 11/13/2015  Procedure: VIDEO  BRONCHOSCOPY WITH ENDOBRONCHIAL NAVIGATION, with placement of fudicial markers;  Surgeon: Delight Ovens, MD;  Location: [facility] OR;  Service: Thoracic;  Laterality: N/A; Angela Nevin, MA, CCC-SLP Speech Therapy      Assessment/Plan:  INTERVAL HISTORY: corynebacterium grew w E faecalis   Principal  Problem:   Acute on chronic respiratory failure with hypercapnia (HCC) Active Problems:   Coronary artery disease   Hypertension   Hypothyroidism   Primary cancer of right upper lobe of lung (HCC)   COPD (chronic obstructive pulmonary disease) (HCC)   Chronic diastolic (congestive) heart failure (HCC)   PAF (paroxysmal atrial fibrillation) (HCC)   HCAP (healthcare-associated pneumonia)   Hypotension    EPHRATA MCADAM is a 77 y.o. female with admission for acute on chronic respiratory failure found to have Enterococcus faecalis in 1 of 2 blood cultures.  There were gram-positive rods seen which were undoubtedly the corynebacterium stratum that was isolated.  The latter organism should be regarded as a contaminant.  #1 E faecalis bacteremia:  We will narrow to ampicillin  Repeat blood cultures have been taken.  2D echocardiogram without evidence of endocarditis would not pursue TEE in this patient if she grows Enterococcus faecalis and only one of the 2 blood cultures I would give her 2-week course of treatment if she grows it in both cultures I would probably treat her empirically for endocarditis with dual beta-lactam therapy  I have personally spent 50 minutes involved in face-to-face and non-face-to-face activities for this patient on the day of the visit. Professional time spent includes the following activities: Preparing to see the patient (review of tests), Obtaining and/or reviewing separately obtained history (admission/discharge record), Performing a medically appropriate examination and/or evaluation , Ordering medications/tests/procedures, referring and communicating with other health care professionals, Documenting clinical information in the EMR, Independently interpreting results (not separately reported), Communicating results to the patient/family/caregiver, Counseling and educating the patient/family/caregiver and Care coordination (not separately reported).     LOS: 3  days   Acey Lav 08/04/2023, 10:25 AM

## 2023-08-05 ENCOUNTER — Other Ambulatory Visit: Payer: Self-pay

## 2023-08-05 DIAGNOSIS — I10 Essential (primary) hypertension: Secondary | ICD-10-CM

## 2023-08-05 DIAGNOSIS — B952 Enterococcus as the cause of diseases classified elsewhere: Secondary | ICD-10-CM | POA: Insufficient documentation

## 2023-08-05 DIAGNOSIS — A498 Other bacterial infections of unspecified site: Secondary | ICD-10-CM

## 2023-08-05 DIAGNOSIS — I5032 Chronic diastolic (congestive) heart failure: Secondary | ICD-10-CM | POA: Diagnosis not present

## 2023-08-05 DIAGNOSIS — C3411 Malignant neoplasm of upper lobe, right bronchus or lung: Secondary | ICD-10-CM

## 2023-08-05 DIAGNOSIS — J9622 Acute and chronic respiratory failure with hypercapnia: Secondary | ICD-10-CM | POA: Diagnosis not present

## 2023-08-05 DIAGNOSIS — R7881 Bacteremia: Secondary | ICD-10-CM

## 2023-08-05 DIAGNOSIS — E039 Hypothyroidism, unspecified: Secondary | ICD-10-CM

## 2023-08-05 DIAGNOSIS — J410 Simple chronic bronchitis: Secondary | ICD-10-CM | POA: Diagnosis not present

## 2023-08-05 LAB — BASIC METABOLIC PANEL
Anion gap: 7 (ref 5–15)
BUN: 26 mg/dL — ABNORMAL HIGH (ref 8–23)
CO2: 41 mmol/L — ABNORMAL HIGH (ref 22–32)
Calcium: 7.6 mg/dL — ABNORMAL LOW (ref 8.9–10.3)
Chloride: 93 mmol/L — ABNORMAL LOW (ref 98–111)
Creatinine, Ser: 0.49 mg/dL (ref 0.44–1.00)
GFR, Estimated: >60 mL/min (ref >60)
Glucose, Bld: 70 mg/dL (ref 70–99)
Potassium: 3.7 mmol/L (ref 3.5–5.1)
Sodium: 141 mmol/L (ref 135–145)

## 2023-08-05 LAB — GLUCOSE, CAPILLARY
Glucose-Capillary: 74 mg/dL (ref 70–99)
Glucose-Capillary: 62 mg/dL — ABNORMAL LOW (ref 70–99)
Glucose-Capillary: 74 mg/dL (ref 70–99)
Glucose-Capillary: 121 mg/dL — ABNORMAL HIGH (ref 70–99)
Glucose-Capillary: 110 mg/dL — ABNORMAL HIGH (ref 70–99)
Glucose-Capillary: 182 mg/dL — ABNORMAL HIGH (ref 70–99)
Glucose-Capillary: 132 mg/dL — ABNORMAL HIGH (ref 70–99)
Glucose-Capillary: 156 mg/dL — ABNORMAL HIGH (ref 70–99)

## 2023-08-05 LAB — MAGNESIUM: Magnesium: 2.5 mg/dL — ABNORMAL HIGH (ref 1.7–2.4)

## 2023-08-05 LAB — CBC
HCT: 45.3 % (ref 36.0–46.0)
Hemoglobin: 14.1 g/dL (ref 12.0–15.0)
MCH: 29.3 pg (ref 26.0–34.0)
MCHC: 31.1 g/dL (ref 30.0–36.0)
MCV: 94.2 fL (ref 80.0–100.0)
Platelets: 117 10*3/uL — ABNORMAL LOW (ref 150–400)
RBC: 4.81 MIL/uL (ref 3.87–5.11)
RDW: 16.6 % — ABNORMAL HIGH (ref 11.5–15.5)
WBC: 21.1 10*3/uL — ABNORMAL HIGH (ref 4.0–10.5)
nRBC: 0 % (ref 0.0–0.2)

## 2023-08-05 MED ORDER — GLUCOSE 40 % PO GEL
ORAL | Status: AC
  Filled 2023-08-05: qty 1.21

## 2023-08-05 MED ORDER — SODIUM CHLORIDE 0.9% FLUSH: 10.0000 mL | INTRAVENOUS | Status: DC | PRN

## 2023-08-05 MED ORDER — GLUCOSE 40 % PO GEL
1.0000 | ORAL | Status: AC
  Administered 2023-08-05: 31 g via ORAL

## 2023-08-05 NOTE — Progress Notes (Signed)
Subjective:  No new complaints  Antibiotics:  Anti-infectives (From admission, onward)    Start     Dose/Rate Route Frequency Ordered Stop   08/04/23 1200  ampicillin (OMNIPEN) 2 g in sodium chloride 0.9 % 100 mL IVPB        2 g 300 mL/hr over 20 Minutes Intravenous Every 4 hours 08/04/23 1022     08/03/23 0600  Ampicillin-Sulbactam (UNASYN) 3 g in sodium chloride 0.9 % 100 mL IVPB  Status:  Discontinued        3 g 200 mL/hr over 30 Minutes Intravenous Every 6 hours 08/02/23 2139 08/04/23 1022   08/02/23 1900  vancomycin (VANCOCIN) IVPB 1000 mg/200 mL premix  Status:  Discontinued        1,000 mg 200 mL/hr over 60 Minutes Intravenous Every 24 hours 08/01/23 2028 08/02/23 1325   08/02/23 0600  piperacillin-tazobactam (ZOSYN) IVPB 3.375 g  Status:  Discontinued        3.375 g 12.5 mL/hr over 240 Minutes Intravenous Every 8 hours 08/01/23 2028 08/02/23 2139   08/01/23 1830  Vancomycin (VANCOCIN) 1,500 mg in sodium chloride 0.9 % 500 mL IVPB        1,500 mg 250 mL/hr over 120 Minutes Intravenous  Once 08/01/23 1743 08/02/23 0848   08/01/23 1800  ceFEPIme (MAXIPIME) 2 g in sodium chloride 0.9 % 100 mL IVPB        2 g 200 mL/hr over 30 Minutes Intravenous  Once 08/01/23 1743 08/01/23 1849       Medications: Scheduled Meds:  amiodarone  200 mg Oral Daily   amitriptyline  50 mg Oral QHS   apixaban  5 mg Oral BID   Chlorhexidine Gluconate Cloth  6 each Topical Daily   dextrose       digoxin  0.125 mg Oral Daily   diltiazem  120 mg Oral Daily   docusate sodium  100 mg Oral BID   levothyroxine  88 mcg Oral QAC breakfast   midodrine  5 mg Oral TID WC   mometasone-formoterol  2 puff Inhalation BID   mouth rinse  15 mL Mouth Rinse 4 times per day   predniSONE  20 mg Oral Q breakfast   torsemide  40 mg Oral BID   Continuous Infusions:  ampicillin (OMNIPEN) IV Stopped (08/05/23 0829)   PRN Meds:.acetaminophen **OR** acetaminophen, alum & mag hydroxide-simeth,  antiseptic oral rinse, benzonatate, dextrose, hydrALAZINE, levalbuterol, lip balm, metoprolol tartrate, nitroGLYCERIN, ondansetron **OR** ondansetron (ZOFRAN) IV, mouth rinse, senna-docusate, traZODone    Objective: Weight change:   Intake/Output Summary (Last 24 hours) at 08/05/2023 1053 Last data filed at 08/05/2023 0605 Gross per 24 hour  Intake 1167.37 ml  Output 1600 ml  Net -432.63 ml   Blood pressure 104/66, pulse (!) 120, temperature (!) 97.4 F (36.3 C), temperature source Oral, resp. rate (!) 23, height 5\' 2"  (1.575 m), weight 69.5 kg, SpO2 (!) 88%. Temp:  [97.4 F (36.3 C)-98.4 F (36.9 C)] 97.4 F (36.3 C) (12/19 0800) Pulse Rate:  [63-120] 120 (12/19 0900) Resp:  [12-23] 23 (12/19 0900) BP: (73-121)/(24-79) 104/66 (12/19 0900) SpO2:  [88 %-100 %] 88 % (12/19 0900) FiO2 (%):  [35 %] 35 % (12/19 0130)  Physical Exam: Physical Exam Constitutional:      General: She is not in acute distress.    Appearance: She is well-developed. She is obese. She is not diaphoretic.  HENT:     Head: Normocephalic and atraumatic.  Right Ear: External ear normal.     Left Ear: External ear normal.  Eyes:     General: No scleral icterus.    Conjunctiva/sclera: Conjunctivae normal.     Pupils: Pupils are equal, round, and reactive to light.  Cardiovascular:     Rate and Rhythm: Normal rate. Rhythm irregular.  Pulmonary:     Effort: Pulmonary effort is normal. No respiratory distress.     Breath sounds: No wheezing.  Abdominal:     Palpations: Abdomen is soft.  Musculoskeletal:        General: No tenderness. Normal range of motion.  Skin:    General: Skin is warm and dry.     Coloration: Skin is not pale.     Findings: No erythema or rash.  Neurological:     General: No focal deficit present.     Mental Status: She is alert and oriented to person, place, and time.     Motor: No abnormal muscle tone.  Psychiatric:        Mood and Affect: Mood normal.        Behavior:  Behavior normal.        Thought Content: Thought content normal.        Judgment: Judgment normal.     Legs wrapped  CBC:    BMET Recent Labs    08/04/23 0259 08/05/23 0300  NA 141 141  K 3.0* 3.7  CL 94* 93*  CO2 39* 41*  GLUCOSE 71 70  BUN 33* 26*  CREATININE 0.63 0.49  CALCIUM 7.2* 7.6*     Liver Panel  No results for input(s): "PROT", "ALBUMIN", "AST", "ALT", "ALKPHOS", "BILITOT", "BILIDIR", "IBILI" in the last 72 hours.      Sedimentation Rate No results for input(s): "ESRSEDRATE" in the last 72 hours. C-Reactive Protein No results for input(s): "CRP" in the last 72 hours.  Micro Results: Recent Results (from the past 720 hours)  Resp panel by RT-PCR (RSV, Flu A&B, Covid) Anterior Nasal Swab     Status: None   Collection Time: 07/12/23  9:52 PM   Specimen: Anterior Nasal Swab  Result Value Ref Range Status   SARS Coronavirus 2 by RT PCR NEGATIVE NEGATIVE Final   Influenza A by PCR NEGATIVE NEGATIVE Final   Influenza B by PCR NEGATIVE NEGATIVE Final    Comment: (NOTE) The Xpert Xpress SARS-CoV-2/FLU/RSV plus assay is intended as an aid in the diagnosis of influenza from Nasopharyngeal swab specimens and should not be used as a sole basis for treatment. Nasal washings and aspirates are unacceptable for Xpert Xpress SARS-CoV-2/FLU/RSV testing.  Fact Sheet for Patients: BloggerCourse.com  Fact Sheet for Healthcare Providers: SeriousBroker.it  This test is not yet approved or cleared by the [place] FDA and has been authorized for detection and/or diagnosis of SARS-CoV-2 by FDA under an Emergency Use Authorization (EUA). This EUA will remain in effect (meaning this test can be used) for the duration of the COVID-19 declaration under Section 564(b)(1) of the Act, 21 U.S.C. section 360bbb-3(b)(1), unless the authorization is terminated or revoked.     Resp Syncytial Virus by PCR NEGATIVE  NEGATIVE Final    Comment: (NOTE) Fact Sheet for Patients: BloggerCourse.com  Fact Sheet for Healthcare Providers: SeriousBroker.it  This test is not yet approved or cleared by the [place] FDA and has been authorized for detection and/or diagnosis of SARS-CoV-2 by FDA under an Emergency Use Authorization (EUA). This EUA will remain in effect (meaning this test can be  used) for the duration of the COVID-19 declaration under Section 564(b)(1) of the Act, 21 U.S.C. section 360bbb-3(b)(1), unless the authorization is terminated or revoked.  Performed at [facility], [address] [place], [place] [address]   Culture, blood (Routine X 2) w Reflex to ID Panel     Status: None   Collection Time: 07/13/23 12:43 AM   Specimen: BLOOD RIGHT HAND  Result Value Ref Range Status   Specimen Description BLOOD RIGHT HAND  Final   Special Requests   Final    BOTTLES DRAWN AEROBIC AND ANAEROBIC Blood Culture adequate volume   Culture   Final    NO GROWTH 5 DAYS Performed at [facility], [address], [place], [place] [address]    Report Status 07/18/2023 FINAL  Final  Culture, blood (Routine X 2) w Reflex to ID Panel     Status: None   Collection Time: 07/13/23  3:37 AM   Specimen: BLOOD RIGHT HAND  Result Value Ref Range Status   Specimen Description BLOOD RIGHT HAND  Final   Special Requests   Final    BOTTLES DRAWN AEROBIC AND ANAEROBIC Blood Culture adequate volume   Culture   Final    NO GROWTH 5 DAYS Performed at [facility], [address], [place], [place] [address]    Report Status 07/18/2023 FINAL  Final  MRSA Next Gen by PCR, Nasal     Status: None   Collection Time: 07/14/23 12:30 PM   Specimen: Nasal Mucosa; Nasal Swab  Result Value Ref Range Status   MRSA by PCR Next Gen NOT DETECTED NOT DETECTED Final    Comment: (NOTE) The GeneXpert MRSA Assay (FDA approved for NASAL specimens only), is one  component of a comprehensive MRSA colonization surveillance program. It is not intended to diagnose MRSA infection nor to guide or monitor treatment for MRSA infections. Test performance is not FDA approved in patients less than 84 years old. Performed at [facility], [address] [place], [place] [address]   Resp panel by RT-PCR (RSV, Flu A&B, Covid) Anterior Nasal Swab     Status: None   Collection Time: 07/30/23  9:26 PM   Specimen: Anterior Nasal Swab  Result Value Ref Range Status   SARS Coronavirus 2 by RT PCR NEGATIVE NEGATIVE Final   Influenza A by PCR NEGATIVE NEGATIVE Final   Influenza B by PCR NEGATIVE NEGATIVE Final    Comment: (NOTE) The Xpert Xpress SARS-CoV-2/FLU/RSV plus assay is intended as an aid in the diagnosis of influenza from Nasopharyngeal swab specimens and should not be used as a sole basis for treatment. Nasal washings and aspirates are unacceptable for Xpert Xpress SARS-CoV-2/FLU/RSV testing.  Fact Sheet for Patients: BloggerCourse.com  Fact Sheet for Healthcare Providers: SeriousBroker.it  This test is not yet approved or cleared by the [place] FDA and has been authorized for detection and/or diagnosis of SARS-CoV-2 by FDA under an Emergency Use Authorization (EUA). This EUA will remain in effect (meaning this test can be used) for the duration of the COVID-19 declaration under Section 564(b)(1) of the Act, 21 U.S.C. section 360bbb-3(b)(1), unless the authorization is terminated or revoked.     Resp Syncytial Virus by PCR NEGATIVE NEGATIVE Final    Comment: (NOTE) Fact Sheet for Patients: BloggerCourse.com  Fact Sheet for Healthcare Providers: SeriousBroker.it  This test is not yet approved or cleared by the [place] FDA and has been authorized for detection and/or diagnosis  of SARS-CoV-2 by FDA under an Emergency Use  Authorization (EUA). This EUA will remain in effect (meaning this test can be used) for the duration of the COVID-19 declaration under Section 564(b)(1) of the Act, 21 U.S.C. section 360bbb-3(b)(1), unless the authorization is terminated or revoked.  Performed at [facility], [address] [place], [place] [address]   Resp panel by RT-PCR (RSV, Flu A&B, Covid) Anterior Nasal Swab     Status: None   Collection Time: 08/01/23  4:43 PM   Specimen: Anterior Nasal Swab  Result Value Ref Range Status   SARS Coronavirus 2 by RT PCR NEGATIVE NEGATIVE Final    Comment: (NOTE) SARS-CoV-2 target nucleic acids are NOT DETECTED.  The SARS-CoV-2 RNA is generally detectable in upper respiratory specimens during the acute phase of infection. The lowest concentration of SARS-CoV-2 viral copies this assay can detect is 138 copies/mL. A negative result does not preclude SARS-Cov-2 infection and should not be used as the sole basis for treatment or other patient management decisions. A negative result may occur with  improper specimen collection/handling, submission of specimen other than nasopharyngeal swab, presence of viral mutation(s) within the areas targeted by this assay, and inadequate number of viral copies(<138 copies/mL). A negative result must be combined with clinical observations, patient history, and epidemiological information. The expected result is Negative.  Fact Sheet for Patients:  BloggerCourse.com  Fact Sheet for Healthcare Providers:  SeriousBroker.it  This test is no t yet approved or cleared by the [place] FDA and  has been authorized for detection and/or diagnosis of SARS-CoV-2 by FDA under an Emergency Use Authorization (EUA). This EUA will remain  in effect (meaning this test can be used) for the duration of the COVID-19 declaration under Section 564(b)(1) of the Act, 21 U.S.C.section 360bbb-3(b)(1),  unless the authorization is terminated  or revoked sooner.       Influenza A by PCR NEGATIVE NEGATIVE Final   Influenza B by PCR NEGATIVE NEGATIVE Final    Comment: (NOTE) The Xpert Xpress SARS-CoV-2/FLU/RSV plus assay is intended as an aid in the diagnosis of influenza from Nasopharyngeal swab specimens and should not be used as a sole basis for treatment. Nasal washings and aspirates are unacceptable for Xpert Xpress SARS-CoV-2/FLU/RSV testing.  Fact Sheet for Patients: BloggerCourse.com  Fact Sheet for Healthcare Providers: SeriousBroker.it  This test is not yet approved or cleared by the [place] FDA and has been authorized for detection and/or diagnosis of SARS-CoV-2 by FDA under an Emergency Use Authorization (EUA). This EUA will remain in effect (meaning this test can be used) for the duration of the COVID-19 declaration under Section 564(b)(1) of the Act, 21 U.S.C. section 360bbb-3(b)(1), unless the authorization is terminated or revoked.     Resp Syncytial Virus by PCR NEGATIVE NEGATIVE Final    Comment: (NOTE) Fact Sheet for Patients: BloggerCourse.com  Fact Sheet for Healthcare Providers: SeriousBroker.it  This test is not yet approved or cleared by the [place] FDA and has been authorized for detection and/or diagnosis of SARS-CoV-2 by FDA under an Emergency Use Authorization (EUA). This EUA will remain in effect (meaning this test can be used) for the duration of the COVID-19 declaration under Section 564(b)(1) of the Act, 21 U.S.C. section 360bbb-3(b)(1), unless the authorization is terminated or revoked.  Performed at [facility], [address], [place], [place] [address]   Culture, blood (routine x 2)     Status: Abnormal   Collection Time: 08/01/23  4:50 PM   Specimen: BLOOD  Result Value Ref Range Status   Specimen  Description   Final    BLOOD LEFT ANTECUBITAL Performed at [facility], [address] [place], [place] [address]    Special Requests   Final    BOTTLES DRAWN AEROBIC AND ANAEROBIC Blood Culture results may not be optimal due to an inadequate volume of blood received in culture bottles Performed at [facility], [address], [place], [place] [address]    Culture  Setup Time   Final    GRAM POSITIVE COCCI ANAEROBIC BOTTLE ONLY CRITICAL RESULT CALLED TO, READ BACK BY AND VERIFIED WITH: PHARMD CHRISTINE SHADE ON 08/02/23 @ 1814 BY DRT GRAM POSITIVE RODS AEROBIC BOTTLE ONLY CRITICAL RESULT CALLED TO, READ BACK BY AND VERIFIED WITH: PHARMD CHRISTINE SHADE ON 08/02/23 @ 1817 BY DRT    Culture (A)  Final    ENTEROCOCCUS FAECALIS CORYNEBACTERIUM STRIATUM Standardized susceptibility testing for this organism is not available. Performed at [facility], [address] [place], [place] [address]    Report Status 08/04/2023 FINAL  Final   Organism ID, Bacteria ENTEROCOCCUS FAECALIS  Final      Susceptibility   Enterococcus faecalis - MIC*    AMPICILLIN <=2 SENSITIVE Sensitive     VANCOMYCIN 1 SENSITIVE Sensitive     GENTAMICIN SYNERGY SENSITIVE Sensitive     * ENTEROCOCCUS FAECALIS  Blood Culture ID Panel (Reflexed)     Status: Abnormal   Collection Time: 08/01/23  4:50 PM  Result Value Ref Range Status   Enterococcus faecalis DETECTED (A) NOT DETECTED Final    Comment: CRITICAL RESULT CALLED TO, READ BACK BY AND VERIFIED WITH: PHARMD CHRISTINE SHADE ON 08/02/23 @ 1814 BY DRT    Enterococcus Faecium NOT DETECTED NOT DETECTED Final   Listeria monocytogenes NOT DETECTED NOT DETECTED Final   Staphylococcus species NOT DETECTED NOT DETECTED Final   Staphylococcus aureus (BCID) NOT DETECTED NOT DETECTED Final   Staphylococcus epidermidis NOT DETECTED NOT DETECTED Final   Staphylococcus lugdunensis NOT DETECTED NOT DETECTED Final    Streptococcus species NOT DETECTED NOT DETECTED Final   Streptococcus agalactiae NOT DETECTED NOT DETECTED Final   Streptococcus pneumoniae NOT DETECTED NOT DETECTED Final   Streptococcus pyogenes NOT DETECTED NOT DETECTED Final   A.calcoaceticus-baumannii NOT DETECTED NOT DETECTED Final   Bacteroides fragilis NOT DETECTED NOT DETECTED Final   Enterobacterales NOT DETECTED NOT DETECTED Final   Enterobacter cloacae complex NOT DETECTED NOT DETECTED Final   Escherichia coli NOT DETECTED NOT DETECTED Final   Klebsiella aerogenes NOT DETECTED NOT DETECTED Final   Klebsiella oxytoca NOT DETECTED NOT DETECTED Final   Klebsiella pneumoniae NOT DETECTED NOT DETECTED Final   Proteus species NOT DETECTED NOT DETECTED Final   Salmonella species NOT DETECTED NOT DETECTED Final   Serratia marcescens NOT DETECTED NOT DETECTED Final   Haemophilus influenzae NOT DETECTED NOT DETECTED Final   Neisseria meningitidis NOT DETECTED NOT DETECTED Final   Pseudomonas aeruginosa NOT DETECTED NOT DETECTED Final   Stenotrophomonas maltophilia NOT DETECTED NOT DETECTED Final   Candida albicans NOT DETECTED NOT DETECTED Final   Candida auris NOT DETECTED NOT DETECTED Final   Candida glabrata NOT DETECTED NOT DETECTED Final   Candida krusei NOT DETECTED NOT DETECTED Final   Candida parapsilosis NOT DETECTED NOT DETECTED Final   Candida tropicalis NOT DETECTED NOT DETECTED Final   Cryptococcus neoformans/gattii NOT DETECTED NOT DETECTED Final   Vancomycin resistance NOT DETECTED NOT  DETECTED Final    Comment: Performed at [facility], [address], [place], [place] [address]  Urine Culture     Status: Abnormal   Collection Time: 08/01/23  5:02 PM   Specimen: Urine, Random  Result Value Ref Range Status   Specimen Description   Final    URINE, RANDOM Performed at [facility], [address] [place], [place] [address]    Special Requests   Final    NONE Reflexed from  [phone] Performed at [facility], [address], [place], [place] [address]    Culture (A)  Final    <10,000 COLONIES/mL INSIGNIFICANT GROWTH Performed at [facility], [address], [place], [place] [address]    Report Status 08/03/2023 FINAL  Final  Culture, blood (routine x 2)     Status: None (Preliminary result)   Collection Time: 08/01/23  5:09 PM   Specimen: BLOOD  Result Value Ref Range Status   Specimen Description   Final    BLOOD BLOOD RIGHT HAND Performed at [facility], [address] [place], [place] [address]    Special Requests   Final    BOTTLES DRAWN AEROBIC ONLY Blood Culture results may not be optimal due to an inadequate volume of blood received in culture bottles Performed at [facility], [address], [place], [place] [address]    Culture   Final    NO GROWTH 3 DAYS Performed at [facility], [address] [place], [place] [address]    Report Status PENDING  Incomplete  MRSA Next Gen by PCR, Nasal     Status: None   Collection Time: 08/01/23  9:15 PM   Specimen: Nasal Mucosa; Nasal Swab  Result Value Ref Range Status   MRSA by PCR Next Gen NOT DETECTED NOT DETECTED Final    Comment: (NOTE) The GeneXpert MRSA Assay (FDA approved for NASAL specimens only), is one component of a comprehensive MRSA colonization surveillance program. It is not intended to diagnose MRSA infection nor to guide or monitor treatment for MRSA infections. Test performance is not FDA approved in patients less than 33 years old. Performed at [facility], [address] [place], [place] [address]   Culture, blood (Routine X 2) w Reflex to ID Panel     Status: None (Preliminary result)   Collection Time: 08/03/23  3:03 AM   Specimen: BLOOD  Result Value Ref Range Status   Specimen Description   Final    BLOOD BLOOD RIGHT ARM Performed at [facility], [address], [place], [place] [address]    Special Requests   Final    BOTTLES DRAWN AEROBIC AND ANAEROBIC Blood Culture adequate volume Performed at [facility], [address] [place], [place] [address]    Culture   Final    NO GROWTH < 24 HOURS Performed at [facility], [address] [place], [place] [address]    Report Status PENDING  Incomplete  Culture, blood (Routine X 2) w Reflex to ID Panel     Status: None (Preliminary result)   Collection Time: 08/03/23  5:49 AM   Specimen: BLOOD RIGHT ARM  Result Value Ref Range Status   Specimen Description   Final    BLOOD RIGHT ARM Performed at [facility], [address], [place], [place] [address]    Special Requests   Final  BOTTLES DRAWN AEROBIC AND ANAEROBIC Blood Culture results may not be optimal due to an inadequate volume of blood received in culture bottles Performed at [facility], [address] [place], [place] [address]    Culture   Final    NO GROWTH < 24 HOURS Performed at [facility], [address] [place], [place] [address]    Report Status PENDING  Incomplete    Studies/Results: ECHOCARDIOGRAM COMPLETE Result Date: 08/03/2023    ECHOCARDIOGRAM REPORT   Patient Name:   Denise [facility] Date of Exam: 08/03/2023 Medical Rec #:  [id number]    Height:       62.0 in Accession #:    [id number]   Weight:       153.2 lb Date of Birth:  1946/03/27    BSA:          1.707 m Patient Age:    77 years     BP:           143/43 mmHg Patient Gender: F            HR:           80 bpm. Exam Location:  Inpatient Procedure: 2D Echo, Cardiac Doppler and Color Doppler Indications:    Bacteremia R78.81  History:        Patient has prior history of Echocardiogram examinations, most                 recent 07/16/2023. CHF, CAD, Arrythmias:Atrial Fibrillation and                 Atrial Flutter; Risk Factors:Hypertension and Diabetes.  Sonographer:    Darlys Gales Referring Phys: [phone]  Uma Jerde N VAN [place] IMPRESSIONS  1. Left ventricular ejection fraction, by estimation, is 60 to 65%. The left ventricle has normal function. The left ventricle has no regional wall motion abnormalities. Left ventricular diastolic parameters are indeterminate. There is the interventricular septum is flattened in systole, consistent with right ventricular pressure overload.  2. Right ventricular systolic function is severely reduced. The right ventricular size is severely enlarged.  3. Right atrial size was moderately dilated.  4. The mitral valve is normal in structure. No evidence of mitral valve regurgitation. No evidence of mitral stenosis.  5. The aortic valve is tricuspid. There is mild calcification of the aortic valve. Aortic valve regurgitation is not visualized. No aortic stenosis is present.  6. The inferior vena cava is dilated in size with >50% respiratory variability, suggesting right atrial pressure of 8 mmHg. FINDINGS  Left Ventricle: Left ventricular ejection fraction, by estimation, is 60 to 65%. The left ventricle has normal function. The left ventricle has no regional wall motion abnormalities. The left ventricular internal cavity size was normal in size. There is  no left ventricular hypertrophy. The interventricular septum is flattened in systole, consistent with right ventricular pressure overload. Left ventricular diastolic parameters are indeterminate. Right Ventricle: The right ventricular size is severely enlarged. No increase in right ventricular wall thickness. Right ventricular systolic function is severely reduced. Left Atrium: Left atrial size was normal in size. Right Atrium: Right atrial size was moderately dilated. Pericardium: There is no evidence of pericardial effusion. Mitral Valve: The mitral valve is normal in structure. No evidence of mitral valve regurgitation. No evidence of mitral valve stenosis. Tricuspid Valve: The tricuspid valve is normal in structure. Tricuspid valve  regurgitation is mild . No evidence of tricuspid stenosis. Aortic Valve: The aortic valve is  tricuspid. There is mild calcification of the aortic valve. Aortic valve regurgitation is not visualized. No aortic stenosis is present. Pulmonic Valve: The pulmonic valve was normal in structure. Pulmonic valve regurgitation is not visualized. No evidence of pulmonic stenosis. Aorta: The aortic root is normal in size and structure. Venous: The inferior vena cava is dilated in size with greater than 50% respiratory variability, suggesting right atrial pressure of 8 mmHg. IAS/Shunts: There is left bowing of the interatrial septum, suggestive of elevated right atrial pressure. No atrial level shunt detected by color flow Doppler.  LEFT VENTRICLE PLAX 2D LVIDd:         4.50 cm LVIDs:         2.80 cm LV PW:         0.80 cm LV IVS:        0.90 cm LVOT diam:     1.70 cm LVOT Area:     2.27 cm  RIGHT VENTRICLE RV Basal diam:  5.30 cm RV Mid diam:    5.50 cm TAPSE (M-mode): 0.6 cm LEFT ATRIUM             Index        RIGHT ATRIUM           Index LA Vol (A2C):   44.6 ml 26.13 ml/m  RA Area:     16.00 cm LA Vol (A4C):   43.4 ml 25.42 ml/m  RA Volume:   39.20 ml  22.96 ml/m LA Biplane Vol: 46.6 ml 27.30 ml/m   AORTA Ao Root diam: 2.30 cm TRICUSPID VALVE TR Peak grad:   36.0 mmHg TR Vmax:        300.00 cm/s  SHUNTS Systemic Diam: 1.70 cm Arvilla Meres MD Electronically signed by Arvilla Meres MD Signature Date/Time: 08/03/2023/3:03:11 PM    Final    DG Swallowing Func-Speech Pathology Result Date: 08/03/2023 Table formatting from the original result was not included. Modified Barium Swallow Study Patient Details Name: Denise [facility] MRN: [id number] Date of Birth: 1946/05/23 Today's Date: 08/03/2023 HPI/PMH: HPI: Patient is a 77 y.o. female with PMH: CAD, h/o CABG 1996, COPD, HTN, HLD, hypothyroidism, DM-2, chronic respiratory failure on 3L, h/o breast and lung cancer. She was recently admitted 11/25-12/6/24 with anasarca,  CHF exacerbation, a-fib with RVR. She was discharged to [facility]. She presented to hospital on 12/15 with SOB, wheezing and altered. In ER, she was  hypoxic at 86% on home 3L oxygen, HR 150. CT chest suggestive of PNA. She was placed on BiPAP. SLP swallow evaluation ordered secondary to patient c/o difficult with swallowing pills and RN observing patient to have instances of coughing when drinking liquids. Clinical Impression: Clinical Impression: Patient presents with a mild oral and pharyngoesophageal dysphagia as per this modified barium swallow study. During oral phase, she exhibited slow mastication and slow lingual movement, leading to delayed bolus transit from anterior to posterior portion of oral cavity. Swallow was initiated at level of vallecular sinus with puree, mechanical soft solids and honey thick liquids, and at posterior laryngeal surface of the epiglottis and at times delayed to pyriform sinus with thin and nectar thick liquids. Anterior hyoid excursion, laryngeal elevation and epiglottic inversion all appeared complete. No significant amount of pharyngeal residuals remained after swallows and no instances of penetration or aspiration observed at any phase of the swallow. When taken with thin liquids, barium tablet become briefly lodged in vallecular sinus but transited with more sips of liquids and bites of puree. When camera  panned down to view esophagus, barium tablet observed to be at upper thoracic level of esophagus. Sips of liquids and bites of puree helped to fully transit pill. No barium stasis observed with other barium consistencies but SLP questions reduced esophageal motility. (no radiologist present to confirm) SLP not recommending further skilled intervention but recommends patient take breaks while eating, alternate solids and liquids and avoid rapid PO consumption. RN notified of results and recommendations. Factors that may increase risk of adverse event in presence of  aspiration Denise [employer]ance [employer]oots 2021): Factors that may increase risk of adverse event in presence of aspiration Denise [employer]oots 2021): Limited mobility; Poor general health and/or compromised immunity; Frail or deconditioned; Respiratory or GI disease Recommendations/Plan: Swallowing Evaluation Recommendations Swallowing Evaluation Recommendations Recommendations: PO diet PO Diet Recommendation: Regular; Thin liquids (Level 0) Liquid Administration via: Straw; Cup Medication Administration: Whole meds with liquid Supervision: Patient able to self-feed; Set-up assistance for safety Swallowing strategies  : Minimize environmental distractions; Slow rate Postural changes: Stay upright 30-60 min after meals; Position pt fully upright for meals Oral care recommendations: Oral care BID (2x/day) Treatment Plan Treatment Plan Treatment recommendations: No treatment recommended at this time Follow-up recommendations: No SLP follow up Functional status assessment: Patient has not had a recent decline in their functional status. Recommendations Recommendations for follow up therapy are one component of a multi-disciplinary discharge planning process, led by the attending physician.  Recommendations may be updated based on patient status, additional functional criteria and insurance authorization. Assessment: Orofacial Exam: Orofacial Exam Oral Cavity - Dentition: Adequate natural dentition Anatomy: Anatomy: WFL Boluses Administered: Boluses Administered Boluses Administered: Thin liquids (Level 0); Mildly thick liquids (Level 2, nectar thick); Moderately thick liquids (Level 3, honey thick); Puree; Solid  Oral Impairment Domain: Oral Impairment Domain Lip Closure: Escape from interlabial space or lateral juncture, no extension beyond vermillion border Tongue control during bolus hold: Not tested Bolus preparation/mastication: Slow prolonged chewing/mashing with complete recollection Bolus transport/lingual motion: Slow tongue  motion Oral residue: Trace residue lining oral structures Location of oral residue : Tongue; Floor of mouth Initiation of pharyngeal swallow : Posterior laryngeal surface of the epiglottis; Pyriform sinuses  Pharyngeal Impairment Domain: Pharyngeal Impairment Domain Soft palate elevation: No bolus between soft palate (SP)/pharyngeal wall (PW) Laryngeal elevation: Complete superior movement of thyroid cartilage with complete approximation of arytenoids to epiglottic petiole Anterior hyoid excursion: Complete anterior movement Epiglottic movement: Complete inversion Laryngeal vestibule closure: Complete, no air/contrast in laryngeal vestibule Pharyngeal stripping wave : Present - complete Pharyngeal contraction (A/P view only): N/A Pharyngoesophageal segment opening: Complete distension and complete duration, no obstruction of flow Tongue base retraction: No contrast between tongue base and posterior pharyngeal wall (PPW) Pharyngeal residue: Complete pharyngeal clearance Location of pharyngeal residue: N/A  Esophageal Impairment Domain: Esophageal Impairment Domain Esophageal clearance upright position: Complete clearance, esophageal coating Pill: Pill Consistency administered: Thin liquids (Level 0); Puree Thin liquids (Level 0): Impaired (see clinical impressions) Puree: Impaired (see clinical impressions) Penetration/Aspiration Scale Score: Penetration/Aspiration Scale Score 1.  Material does not enter airway: Thin liquids (Level 0); Mildly thick liquids (Level 2, nectar thick); Moderately thick liquids (Level 3, honey thick); Puree; Solid; Pill Compensatory Strategies: Compensatory Strategies Compensatory strategies: Yes Straw: Effective Effective Straw: Thin liquid (Level 0) Liquid wash: Effective Effective Liquid Wash: Pill; Moderately thick liquid (Level 3, honey thick)   General Information: No data recorded Diet Prior to this Study: Regular; Thin liquids (Level 0)   Temperature : Normal   Respiratory Status:  Denise [facility]  Supplemental O2: Nasal cannula   History of Recent Intubation: No  Behavior/Cognition: Alert; Cooperative; Pleasant mood Self-Feeding Abilities: Able to self-feed Baseline vocal quality/speech: Normal Volitional Cough: Able to elicit Volitional Swallow: Able to elicit Exam Limitations: No limitations Goal Planning: No data recorded No data recorded No data recorded Patient/Family Stated Goal: patient reports h/o difficulty with larger pills Consulted and agree with results and recommendations: Patient; Nurse Pain: Pain Assessment Pain Assessment: No/denies pain Pain Score: 0 End of Session: Start Time:SLP Start Time (ACUTE ONLY): 1120 Stop Time: SLP Stop Time (ACUTE ONLY): 1135 Time Calculation:SLP Time Calculation (min) (ACUTE ONLY): 15 min Charges: SLP Evaluations $ SLP Speech Visit: 1 Visit SLP Evaluations $BSS Swallow: 1 Procedure $MBS Swallow: 1 Procedure SLP visit diagnosis: SLP Visit Diagnosis: Dysphagia, pharyngoesophageal phase (R13.14); Dysphagia, oral phase (R13.11) Past Medical History: Past Medical History: Diagnosis Date  Acute exacerbation of CHF (congestive heart failure) (HCC) 07/12/2023  Arthritis   Breast cancer (HCC) dx'd 09/2015  COPD (chronic obstructive pulmonary disease) (HCC)   Coronary artery disease   Depression   Diabetes mellitus (HCC) 10/22/2015  Full dentures   full upper dentures, plate on bottom  History of bronchitis   History of pneumonia   Hyperlipidemia   Hypertension   Hypothyroidism 10/22/2015  lung ca dx;d 09/2015  lung cancer  Myocardial infarction [facility]) July 1995  Osteoporosis 10/22/2015  Personal history of radiation therapy   Stress incontinence   Uterine cancer (HCC) dx'd 1968 Past Surgical History: Past Surgical History: Procedure Laterality Date  ABDOMINAL HYSTERECTOMY    1968  APPENDECTOMY    with hysterectomy  BLADDER SURGERY    1972  BREAST BIOPSY Left 11/04/2015  Malignant  BREAST BIOPSY Left 11/04/2015  Malignant  BREAST LUMPECTOMY Left 11/19/2015  CARDIAC  CATHETERIZATION  1995, 1996  Dr. 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  LUNG BIOPSY N/A 11/13/2015  Procedure: RIGHT LUNG BIOPSY;  Surgeon: Delight Ovens, MD;  Location: [facility] OR;  Service: Thoracic;  Laterality: N/A;  MULTIPLE TOOTH EXTRACTIONS    RADIOACTIVE SEED GUIDED PARTIAL MASTECTOMY WITH AXILLARY SENTINEL LYMPH NODE BIOPSY Left 11/19/2015  Procedure: RADIOACTIVE SEED GUIDED PARTIAL MASTECTOMY WITH AXILLARY SENTINEL LYMPH NODE BIOPSY;  Surgeon: Manus Rudd, MD;  Location: [facility];  Service: General;  Laterality: Left;  RE-EXCISION OF BREAST LUMPECTOMY Left 12/03/2015  Procedure: RE-EXCISION INFERIOR MARGIN OF LEFT BREAST LUMPECTOMY;  Surgeon: Manus Rudd, MD;  Location: [facility] OR;  Service: General;  Laterality: Left;  TMJ ARTHROPLASTY    1979  TONSILLECTOMY    TRANSESOPHAGEAL ECHOCARDIOGRAM (CATH LAB) N/A 07/16/2023  Procedure: TRANSESOPHAGEAL ECHOCARDIOGRAM;  Surgeon: Sande Rives, MD;  Location: [facility] INVASIVE CV LAB;  Service: Cardiovascular;  Laterality: N/A;  VIDEO BRONCHOSCOPY WITH ENDOBRONCHIAL NAVIGATION N/A 11/13/2015  Procedure: VIDEO BRONCHOSCOPY WITH ENDOBRONCHIAL NAVIGATION, with placement of fudicial markers;  Surgeon: Delight Ovens, MD;  Location: [facility] OR;  Service: Thoracic;  Laterality: N/A; Angela Nevin, MA, CCC-SLP Speech Therapy      Assessment/Plan:  INTERVAL HISTORY: repeat blood cultures with no growth  Principal Problem:   Acute on chronic respiratory failure with hypercapnia (HCC) Active Problems:   Coronary artery disease    Hypertension   Hypothyroidism   Primary cancer of right upper lobe of lung (HCC)   COPD (chronic obstructive pulmonary disease) (HCC)   Chronic diastolic (congestive) heart failure (HCC)   PAF (paroxysmal atrial fibrillation) (HCC)   HCAP (healthcare-associated pneumonia)   Hypotension    Denise [facility] is a 77 y.o. female with admission for acute on chronic respiratory failure found to have Enterococcus faecalis in 1 of 2 blood cultures.  There were gram-positive rods seen which were undoubtedly the corynebacterium stratum that was isolated.  The latter organism should be regarded as a contaminant.  #1 E faecalis bacteremia:  Will plan on 2 weeks of ampicillin via continuous infusion.  I am confident her blood cultures from the 17th will be without growth.  She can have a midline placed tomorrow.  Diagnosis: E faecalis bacteremia  Culture Result: E faecalis   No Known Allergies  OPAT Orders Discharge antibiotics to be given via PICC line Discharge antibiotics: Ampicillin via continuous infusion  Duration: 2 weeks End Date: 08/16/2023  [facility] Per Protocol:  Home health RN for IV administration and teaching; PICC line care and labs.    Labs weekly while on IV antibiotics: _x_ CBC with differential _x_ BMP  x__ Please pull PIC at completion of IV antibiotics __ Please leave PIC in place until doctor has seen patient or been notified  Fax weekly labs to [phone]  I have personally spent 50 minutes involved in face-to-face and non-face-to-face activities for this patient on the day of the visit. Professional time spent includes the following activities: Preparing to see the patient (review of tests), Obtaining and/or reviewing separately obtained history (admission/discharge record), Performing a medically appropriate examination and/or evaluation , Ordering medications/tests/procedures, referring and communicating with other health care professionals, Documenting  clinical information in the EMR, Independently interpreting results (not separately reported), Communicating results to the patient/family/caregiver, Counseling and educating the patient/family/caregiver and Care coordination (not separately reported).   I will sign off for now  Please call with further questions.   LOS: 4 days   Acey Lav 08/05/2023, 10:53 AM

## 2023-08-05 NOTE — Progress Notes (Signed)
Cardiology will sign off.  Follow-up has been arranged.  Please let us know if you have any questions or concerns.

## 2023-08-05 NOTE — Progress Notes (Signed)
PHARMACY CONSULT NOTE FOR:  OUTPATIENT  PARENTERAL ANTIBIOTIC THERAPY (OPAT)  Indication: E faecalis bacteremia Regimen: Ampicillin 12 gm every 24 hours as a continuous infusion End date: 08/16/23   IV antibiotic discharge orders are pended. To discharging provider:  please sign these orders via discharge navigator,  Select New Orders & click on the button choice - Manage This Unsigned Work.     Thank you for allowing pharmacy to be a part of this patient's care.  Sharin Mons, PharmD, BCPS, BCIDP Infectious Diseases Clinical Pharmacist Phone: [phone] 08/05/2023, 10:55 AM

## 2023-08-05 NOTE — Plan of Care (Signed)
  Problem: Coping: Goal: Ability to adjust to condition or change in health will improve Outcome: Progressing   Problem: Fluid Volume: Goal: Ability to maintain a balanced intake and output will improve Outcome: Progressing   Problem: Nutritional: Goal: Maintenance of adequate nutrition will improve Outcome: Progressing   Problem: Clinical Measurements: Goal: Ability to maintain clinical measurements within normal limits will improve Outcome: Progressing Goal: Diagnostic test results will improve Outcome: Progressing Goal: Respiratory complications will improve Outcome: Progressing   Problem: Skin Integrity: Goal: Risk for impaired skin integrity will decrease Outcome: Not Progressing   Problem: Elimination: Goal: Will not experience complications related to urinary retention Outcome: Not Progressing

## 2023-08-05 NOTE — Inpatient Diabetes Management (Signed)
Inpatient Diabetes Program Recommendations  AACE/ADA: New Consensus Statement on Inpatient Glycemic Control (2015)  Target Ranges:  Prepandial:   less than 140 mg/dL      Peak postprandial:   less than 180 mg/dL (1-2 hours)      Critically ill patients:  140 - 180 mg/dL    Latest Reference Range & Units 08/04/23 00:09 08/04/23 03:42 08/04/23 07:57 08/04/23 11:34 08/04/23 16:22 08/04/23 19:46  Glucose-Capillary 70 - 99 mg/dL 086 (H)  2 units Novolog  80 72 105 (H) 145 (H)  2 units Novolog  123 (H)  2 units Novolog   (H): Data is abnormally high  Latest Reference Range & Units 08/04/23 23:44 08/05/23 03:12 08/05/23 07:54  Glucose-Capillary 70 - 99 mg/dL 80 74 62 (L)  (L): Data is abnormally low    Admit with: Acute on chronic respiratory failure with hypercapnia   History: DM2  Home DM Meds: Farxiga 10 mg daily        Metformin 500 mg daily  Current Orders: Novolog Moderate Correction Scale/ SSI (0-15 units) Q4 hours     MD- Note Hypoglycemia this AM  Please consider reducing the Novolog SSi to the Very Sensitive scale 0-6 units     --Will follow patient during hospitalization--  Ambrose Finland RN, MSN, CDCES Diabetes Coordinator Inpatient Glycemic Control Team Team Pager: [phone] (8a-5p)

## 2023-08-05 NOTE — Progress Notes (Signed)
PROGRESS NOTE    Denise Mckenzie  NGE:[id number] DOB: [birth year] DOA: 08/01/2023 PCP: Burton Apley, MD   Brief Narrative:  The patient is a 77 year old female with past medical history of CAD ( h/o CABG 41), COPD, HTN, HLD, hypothyroidism, T2DM, chronic respiratory failure 3L,  h/o breast and lung cancer.  Patient recently admitted 11/25 - 12/6 with anasarca, CHF exacerbation, A-fib with RVR.  Patient treated with diuresis.  She underwent TEE/DCCV for A-fib.  Patient discharged to [facility].  Patient brought back in after she became short of breath, wheezing and altered.  Initially noted to be in hypercapnic respiratory failure requiring BiPAP.  CT suggestive of possible pneumonia and started on vancomycin and cefepime.  Eventually blood cultures growing GPC/Enterococcus faecalis therefore transition to Unasyn per ID recommendations.  Cardiology following the patient as well.  Echocardiogram showed preserved EF without vegetation.  Cardiology has now signed off the case and recommending anticoagulation and continue amiodarone for now.  She is now retaining some urine still we will do an In-N-Out x 3 and then if she fails another time we will place Foley catheter.  Infectious diseases recommending ampicillin IV every 24 hours with an end date of 08/16/2023 and has made recommendations for OPAT.  She continues to have some hypoglycemia so we will hold her insulin regimen.  Unna boots have been applied now  Assessment & Plan:  Principal Problem:   Acute on chronic respiratory failure with hypercapnia (HCC) Active Problems:   Coronary artery disease   Hypertension   Hypothyroidism   Primary cancer of right upper lobe of lung (HCC)   COPD (chronic obstructive pulmonary disease) (HCC)   Chronic diastolic (congestive) heart failure (HCC)   PAF (paroxysmal atrial fibrillation) (HCC)   HCAP (healthcare-associated pneumonia)   Hypotension   Enterococcus faecalis infection   Bacteremia    Acute on chronic hypercapnic respiratory failure Healthcare acquired pneumonia Enterococcus faecalis bacteremia History of COPD -Weaned off BiPAP.  MRSA swab negative.  Blood cultures growing GPC/Enterococcus faecalis.  Antibiotics per ID.  Echo ef 65%, enlarged RV.  Seen by speech and swallow, recommending regular diet with aspiration precautions. -Placed on empiric prednisone daily for 5 days which is likely caused her leukocytosis to remain elevated; WBC Trend: Recent Labs  Lab 07/25/23 2240 07/30/23 2126 08/01/23 1650 08/02/23 0257 08/03/23 0303 08/04/23 0259 08/05/23 0300  WBC 23.5* 20.9* 20.5* 19.6* 19.2* 18.0* 21.1*  -ID Following recommending outpatient antibiotics with OPAT orders for ampicillin every 24 hours -COVID, flu, RSV negative, urine strep antigen-negative -Continue with Dulera 2 puffs IH twice daily as well as Xopenex 0.63 mg neb every 6 as needed for wheezing and shortness of breath  Diabetes mellitus type 2 -Sliding scale and Accu-Cheks -CBG Trend: Recent Labs  Lab 08/05/23 0312 08/05/23 0754 08/05/23 0827 08/05/23 1116 08/05/23 1138 08/05/23 1551 08/05/23 1933  GLUCAP 74 62* 74 121* 110* 182* 132*   History of coronary artery disease with history of CABG in 1996 CHF with preserved EF with severe RV failure/dilation -Echo shows EF 65%, elevated PASP, severe RV failure.  Signs of third spacing but no obvious intravascular volume overload.  Continue home torsemide 40 mg orally daily and Unna boots -BNP Trend:  Recent Labs  Lab 07/12/23 1434 07/13/23 0038 07/25/23 2240 07/30/23 2126 08/01/23 1650 08/03/23 0303  BNP 2,311.4* 2,289.1* 1,292.5* 1,287.7* 862.2* 528.7*  -Home meds Eliquis, statin, Cardizem, Farxiga  Paroxysmal atrial fibrillation status post cardioversion 07/16/2023 -Currently on amiodarone, digoxin, Cardizem.  Continue Eliquis.  Seen by cardiology they have signed off and scheduled for follow-up appointment outpatient  setting  History of right upper lobe lung cancer History of breast cancer -Both in remission  Acute urinary retention -Has been wheelchair-bound basically since a few months ago -Do INO cath x 3 and if necessary will place Foley catheter and start her on tamsulosin if able  Hyperlipidemia -Holding Statin given slightly abnormal LFTs  Hypothyroidism -Continue with Levothyroxine 88 mcg po Daily  Abnormal LFTs -LFT Trend: Recent Labs  Lab 07/12/23 1500 08/01/23 1650  AST 38 47*  ALT 33 52*  -Continue to Monitor and Trend and repeat CMP in the AM  Thrombocytopenia -Platelet Count Trend: Recent Labs  Lab 07/25/23 2240 07/30/23 2126 08/01/23 1650 08/02/23 0257 08/03/23 0303 08/04/23 0259 08/05/23 0300  PLT 145* 174 171 157 141* 128* 117*  -Continue to Monitor and Trend and repeat CBC in the AM  Hypoalbuminemia -Patient's Albumin Trend: Recent Labs  Lab 07/12/23 1500 08/01/23 1650  ALBUMIN 3.9 2.5*  -Continue to Monitor and Trend and repeat CMP in the AM  Overweight  -Complicates overall prognosis and care -Estimated body mass index is 28.02 kg/m as calculated from the following:   Height as of this encounter: 5\' 2"  (1.575 m).   Weight as of this encounter: 69.5 kg.  -Weight Loss and Dietary Counseling given   DVT prophylaxis:  apixaban (ELIQUIS) tablet 5 mg    Code Status: Full Code Family Communication: No family currently at bedside  Disposition Plan:  Level of care: Stepdown Status is: Inpatient Remains inpatient appropriate because: Needs further clinical improvement and safe discharge disposition   Consultants:  Cardiology Infectious diseases  Procedures:  ECHOCARDIOGRAM IMPRESSIONS     1. Left ventricular ejection fraction, by estimation, is 60 to 65%. The  left ventricle has normal function. The left ventricle has no regional  wall motion abnormalities. Left ventricular diastolic parameters are  indeterminate. There is the   interventricular septum is flattened in systole, consistent with right  ventricular pressure overload.   2. Right ventricular systolic function is severely reduced. The right  ventricular size is severely enlarged.   3. Right atrial size was moderately dilated.   4. The mitral valve is normal in structure. No evidence of mitral valve  regurgitation. No evidence of mitral stenosis.   5. The aortic valve is tricuspid. There is mild calcification of the  aortic valve. Aortic valve regurgitation is not visualized. No aortic  stenosis is present.   6. The inferior vena cava is dilated in size with >50% respiratory  variability, suggesting right atrial pressure of 8 mmHg.   FINDINGS   Left Ventricle: Left ventricular ejection fraction, by estimation, is 60  to 65%. The left ventricle has normal function. The left ventricle has no  regional wall motion abnormalities. The left ventricular internal cavity  size was normal in size. There is   no left ventricular hypertrophy. The interventricular septum is flattened  in systole, consistent with right ventricular pressure overload. Left  ventricular diastolic parameters are indeterminate.   Right Ventricle: The right ventricular size is severely enlarged. No  increase in right ventricular wall thickness. Right ventricular systolic  function is severely reduced.   Left Atrium: Left atrial size was normal in size.   Right Atrium: Right atrial size was moderately dilated.   Pericardium: There is no evidence of pericardial effusion.   Mitral Valve: The mitral valve is normal in structure. No evidence of  mitral valve regurgitation. No  evidence of mitral valve stenosis.   Tricuspid Valve: The tricuspid valve is normal in structure. Tricuspid  valve regurgitation is mild . No evidence of tricuspid stenosis.   Aortic Valve: The aortic valve is tricuspid. There is mild calcification  of the aortic valve. Aortic valve regurgitation is not  visualized. No  aortic stenosis is present.   Pulmonic Valve: The pulmonic valve was normal in structure. Pulmonic valve  regurgitation is not visualized. No evidence of pulmonic stenosis.   Aorta: The aortic root is normal in size and structure.   Venous: The inferior vena cava is dilated in size with greater than 50%  respiratory variability, suggesting right atrial pressure of 8 mmHg.   IAS/Shunts: There is left bowing of the interatrial septum, suggestive of  elevated right atrial pressure. No atrial level shunt detected by color  flow Doppler.     LEFT VENTRICLE  PLAX 2D  LVIDd:         4.50 cm  LVIDs:         2.80 cm  LV PW:         0.80 cm  LV IVS:        0.90 cm  LVOT diam:     1.70 cm  LVOT Area:     2.27 cm     RIGHT VENTRICLE  RV Basal diam:  5.30 cm  RV Mid diam:    5.50 cm  TAPSE (M-mode): 0.6 cm   LEFT ATRIUM             Index        RIGHT ATRIUM           Index  LA Vol (A2C):   44.6 ml 26.13 ml/m  RA Area:     16.00 cm  LA Vol (A4C):   43.4 ml 25.42 ml/m  RA Volume:   39.20 ml  22.96 ml/m  LA Biplane Vol: 46.6 ml 27.30 ml/m     AORTA  Ao Root diam: 2.30 cm   TRICUSPID VALVE  TR Peak grad:   36.0 mmHg  TR Vmax:        300.00 cm/s    SHUNTS  Systemic Diam: 1.70 cm   Antimicrobials:  Anti-infectives (From admission, onward)    Start     Dose/Rate Route Frequency Ordered Stop   08/04/23 1200  ampicillin (OMNIPEN) 2 g in sodium chloride 0.9 % 100 mL IVPB        2 g 300 mL/hr over 20 Minutes Intravenous Every 4 hours 08/04/23 1022     08/03/23 0600  Ampicillin-Sulbactam (UNASYN) 3 g in sodium chloride 0.9 % 100 mL IVPB  Status:  Discontinued        3 g 200 mL/hr over 30 Minutes Intravenous Every 6 hours 08/02/23 2139 08/04/23 1022   08/02/23 1900  vancomycin (VANCOCIN) IVPB 1000 mg/200 mL premix  Status:  Discontinued        1,000 mg 200 mL/hr over 60 Minutes Intravenous Every 24 hours 08/01/23 2028 08/02/23 1325   08/02/23 0600   piperacillin-tazobactam (ZOSYN) IVPB 3.375 g  Status:  Discontinued        3.375 g 12.5 mL/hr over 240 Minutes Intravenous Every 8 hours 08/01/23 2028 08/02/23 2139   08/01/23 1830  Vancomycin (VANCOCIN) 1,500 mg in sodium chloride 0.9 % 500 mL IVPB        1,500 mg 250 mL/hr over 120 Minutes Intravenous  Once 08/01/23 1743 08/02/23 0848   08/01/23 1800  ceFEPIme (  MAXIPIME) 2 g in sodium chloride 0.9 % 100 mL IVPB        2 g 200 mL/hr over 30 Minutes Intravenous  Once 08/01/23 1743 08/01/23 1849       Subjective: Seen and examined at bedside and was doing okay but was complaining of "pain in her butt."  Did not want to get her Unna boots changed yet.  No nausea or vomiting.  Feels okay.  No other concerns or complaints this time.  Objective: Vitals:   08/05/23 1400 08/05/23 1500 08/05/23 1600 08/05/23 1942  BP: (!) 86/63 (!) 107/47    Pulse: 95 80    Resp: (!) 25 20    Temp:   98.2 F (36.8 C) 98.5 F (36.9 C)  TempSrc:   Oral Oral  SpO2: 94% 95%    Weight:      Height:        Intake/Output Summary (Last 24 hours) at 08/05/2023 2038 Last data filed at 08/05/2023 1806 Gross per 24 hour  Intake 1340 ml  Output 1600 ml  Net -260 ml   Filed Weights   08/02/23 1839  Weight: 69.5 kg   Examination: Physical Exam:  Constitutional: Elderly chronically ill-appearing overweight Caucasian female who appears a little uncomfortable Respiratory: Diminished to auscultation bilaterally with some coarse breath sounds, no wheezing, rales, rhonchi or crackles. Normal respiratory effort and patient is not tachypenic. No accessory muscle use.  Unlabored breathing but wearing supplemental oxygen via nasal cannula Cardiovascular: Irregularly irregular, no murmurs / rubs / gallops. S1 and S2 auscultated.  1-2+ lower extremity pitting edema Abdomen: Soft, non-tender, distended secondary to body habitus. Bowel sounds positive.  GU: Deferred. Musculoskeletal: No clubbing / cyanosis of  digits/nails. No joint deformity upper and lower extremities. Skin: No rashes, lesions, ulcers on limited skin evaluation. No induration; Warm and dry.  Neurologic: CN 2-12 grossly intact with no focal deficits. Romberg sign and cerebellar reflexes not assessed.  Psychiatric: Normal judgment and insight. Alert and oriented x 3.  Anxious mood and affect  Data Reviewed: I have personally reviewed following labs and imaging studies  CBC: Recent Labs  Lab 08/01/23 1650 08/02/23 0257 08/03/23 0303 08/04/23 0259 08/05/23 0300  WBC 20.5* 19.6* 19.2* 18.0* 21.1*  NEUTROABS 18.2* 18.8*  --   --   --   HGB 14.9 14.9 13.7 13.3 14.1  HCT 47.5* 47.6* 43.4 43.8 45.3  MCV 95.4 94.1 94.6 95.6 94.2  PLT 171 157 141* 128* 117*   Basic Metabolic Panel: Recent Labs  Lab 08/01/23 1650 08/01/23 1957 08/02/23 0257 08/03/23 0303 08/04/23 0259 08/05/23 0300  NA 141  --  142 145 141 141  K 3.9  --  4.1 3.5 3.0* 3.7  CL 94*  --  96* 97* 94* 93*  CO2 40*  --  37* 40* 39* 41*  GLUCOSE 114*  --  116* 93 71 70  BUN 39*  --  41* 45* 33* 26*  CREATININE 0.78  --  0.71 0.75 0.63 0.49  CALCIUM 7.9*  --  8.0* 7.8* 7.2* 7.6*  MG  --  1.7 1.8 1.9 1.6* 2.5*  PHOS  --   --   --  2.7  --   --    GFR: Estimated Creatinine Clearance: 52.1 mL/min (by C-G formula based on SCr of 0.49 mg/dL). Liver Function Tests: Recent Labs  Lab 08/01/23 1650  AST 47*  ALT 52*  ALKPHOS 86  BILITOT 0.7  PROT 5.0*  ALBUMIN 2.5*  No results for input(s): "LIPASE", "AMYLASE" in the last 168 hours. No results for input(s): "AMMONIA" in the last 168 hours. Coagulation Profile: No results for input(s): "INR", "PROTIME" in the last 168 hours. Cardiac Enzymes: No results for input(s): "CKTOTAL", "CKMB", "CKMBINDEX", "TROPONINI" in the last 168 hours. BNP (last 3 results) Recent Labs    02/22/23 1136 07/02/23 1645 07/09/23 1131  PROBNP 5,414* 21,913* 33,547*   HbA1C: No results for input(s): "HGBA1C" in the last 72  hours. CBG: Recent Labs  Lab 08/05/23 0827 08/05/23 1116 08/05/23 1138 08/05/23 1551 08/05/23 1933  GLUCAP 74 121* 110* 182* 132*   Lipid Profile: No results for input(s): "CHOL", "HDL", "LDLCALC", "TRIG", "CHOLHDL", "LDLDIRECT" in the last 72 hours. Thyroid Function Tests: No results for input(s): "TSH", "T4TOTAL", "FREET4", "T3FREE", "THYROIDAB" in the last 72 hours. Anemia Panel: No results for input(s): "VITAMINB12", "FOLATE", "FERRITIN", "TIBC", "IRON", "RETICCTPCT" in the last 72 hours. Sepsis Labs: Recent Labs  Lab 07/30/23 2208 08/01/23 1654 08/01/23 1844 08/02/23 0257  PROCALCITON  --   --   --  <0.10  LATICACIDVEN 1.4 2.4* 2.1*  --     Recent Results (from the past 240 hours)  Resp panel by RT-PCR (RSV, Flu A&B, Covid) Anterior Nasal Swab     Status: None   Collection Time: 07/30/23  9:26 PM   Specimen: Anterior Nasal Swab  Result Value Ref Range Status   SARS Coronavirus 2 by RT PCR NEGATIVE NEGATIVE Final   Influenza A by PCR NEGATIVE NEGATIVE Final   Influenza B by PCR NEGATIVE NEGATIVE Final    Comment: (NOTE) The Xpert Xpress SARS-CoV-2/FLU/RSV plus assay is intended as an aid in the diagnosis of influenza from Nasopharyngeal swab specimens and should not be used as a sole basis for treatment. Nasal washings and aspirates are unacceptable for Xpert Xpress SARS-CoV-2/FLU/RSV testing.  Fact Sheet for Patients: BloggerCourse.com  Fact Sheet for Healthcare Providers: SeriousBroker.it  This test is not yet approved or cleared by the [place] FDA and has been authorized for detection and/or diagnosis of SARS-CoV-2 by FDA under an Emergency Use Authorization (EUA). This EUA will remain in effect (meaning this test can be used) for the duration of the COVID-19 declaration under Section 564(b)(1) of the Act, 21 U.S.C. section 360bbb-3(b)(1), unless the authorization is terminated or revoked.     Resp  Syncytial Virus by PCR NEGATIVE NEGATIVE Final    Comment: (NOTE) Fact Sheet for Patients: BloggerCourse.com  Fact Sheet for Healthcare Providers: SeriousBroker.it  This test is not yet approved or cleared by the [place] FDA and has been authorized for detection and/or diagnosis of SARS-CoV-2 by FDA under an Emergency Use Authorization (EUA). This EUA will remain in effect (meaning this test can be used) for the duration of the COVID-19 declaration under Section 564(b)(1) of the Act, 21 U.S.C. section 360bbb-3(b)(1), unless the authorization is terminated or revoked.  Performed at [facility], [address] [place], [place] [address]   Resp panel by RT-PCR (RSV, Flu A&B, Covid) Anterior Nasal Swab     Status: None   Collection Time: 08/01/23  4:43 PM   Specimen: Anterior Nasal Swab  Result Value Ref Range Status   SARS Coronavirus 2 by RT PCR NEGATIVE NEGATIVE Final    Comment: (NOTE) SARS-CoV-2 target nucleic acids are NOT DETECTED.  The SARS-CoV-2 RNA is generally detectable in upper respiratory specimens during the acute phase of infection. The lowest concentration of SARS-CoV-2 viral copies this assay can detect is  138 copies/mL. A negative result does not preclude SARS-Cov-2 infection and should not be used as the sole basis for treatment or other patient management decisions. A negative result may occur with  improper specimen collection/handling, submission of specimen other than nasopharyngeal swab, presence of viral mutation(s) within the areas targeted by this assay, and inadequate number of viral copies(<138 copies/mL). A negative result must be combined with clinical observations, patient history, and epidemiological information. The expected result is Negative.  Fact Sheet for Patients:  BloggerCourse.com  Fact Sheet for Healthcare Providers:   SeriousBroker.it  This test is no t yet approved or cleared by the [place] FDA and  has been authorized for detection and/or diagnosis of SARS-CoV-2 by FDA under an Emergency Use Authorization (EUA). This EUA will remain  in effect (meaning this test can be used) for the duration of the COVID-19 declaration under Section 564(b)(1) of the Act, 21 U.S.C.section 360bbb-3(b)(1), unless the authorization is terminated  or revoked sooner.       Influenza A by PCR NEGATIVE NEGATIVE Final   Influenza B by PCR NEGATIVE NEGATIVE Final    Comment: (NOTE) The Xpert Xpress SARS-CoV-2/FLU/RSV plus assay is intended as an aid in the diagnosis of influenza from Nasopharyngeal swab specimens and should not be used as a sole basis for treatment. Nasal washings and aspirates are unacceptable for Xpert Xpress SARS-CoV-2/FLU/RSV testing.  Fact Sheet for Patients: BloggerCourse.com  Fact Sheet for Healthcare Providers: SeriousBroker.it  This test is not yet approved or cleared by the [place] FDA and has been authorized for detection and/or diagnosis of SARS-CoV-2 by FDA under an Emergency Use Authorization (EUA). This EUA will remain in effect (meaning this test can be used) for the duration of the COVID-19 declaration under Section 564(b)(1) of the Act, 21 U.S.C. section 360bbb-3(b)(1), unless the authorization is terminated or revoked.     Resp Syncytial Virus by PCR NEGATIVE NEGATIVE Final    Comment: (NOTE) Fact Sheet for Patients: BloggerCourse.com  Fact Sheet for Healthcare Providers: SeriousBroker.it  This test is not yet approved or cleared by the [place] FDA and has been authorized for detection and/or diagnosis of SARS-CoV-2 by FDA under an Emergency Use Authorization (EUA). This EUA will remain in effect (meaning this test can be used) for  the duration of the COVID-19 declaration under Section 564(b)(1) of the Act, 21 U.S.C. section 360bbb-3(b)(1), unless the authorization is terminated or revoked.  Performed at [facility], [address] [place], [place] [address]   Culture, blood (routine x 2)     Status: Abnormal   Collection Time: 08/01/23  4:50 PM   Specimen: BLOOD  Result Value Ref Range Status   Specimen Description   Final    BLOOD LEFT ANTECUBITAL Performed at [facility], [address] [place] [address], [place], [place] [address]    Special Requests   Final    BOTTLES DRAWN AEROBIC AND ANAEROBIC Blood Culture results may not be optimal due to an inadequate volume of blood received in culture bottles Performed at [facility], [address], [place], [place] [address]    Culture  Setup Time   Final    GRAM POSITIVE COCCI ANAEROBIC BOTTLE ONLY CRITICAL RESULT CALLED TO, READ BACK BY AND VERIFIED WITH: PHARMD CHRISTINE SHADE ON 08/02/23 @ 1814 BY DRT GRAM POSITIVE RODS AEROBIC BOTTLE ONLY CRITICAL RESULT CALLED TO, READ BACK BY AND VERIFIED WITH: PHARMD CHRISTINE SHADE ON 08/02/23 @ 1817 BY DRT  Culture (A)  Final    ENTEROCOCCUS FAECALIS CORYNEBACTERIUM STRIATUM Standardized susceptibility testing for this organism is not available. Performed at [facility], [address], [place], [place] [address]    Report Status 08/04/2023 FINAL  Final   Organism ID, Bacteria ENTEROCOCCUS FAECALIS  Final      Susceptibility   Enterococcus faecalis - MIC*    AMPICILLIN <=2 SENSITIVE Sensitive     VANCOMYCIN 1 SENSITIVE Sensitive     GENTAMICIN SYNERGY SENSITIVE Sensitive     * ENTEROCOCCUS FAECALIS  Blood Culture ID Panel (Reflexed)     Status: Abnormal   Collection Time: 08/01/23  4:50 PM  Result Value Ref Range Status   Enterococcus faecalis DETECTED (A) NOT DETECTED Final    Comment: CRITICAL RESULT CALLED TO, READ BACK BY AND VERIFIED WITH: PHARMD  CHRISTINE SHADE ON 08/02/23 @ 1814 BY DRT    Enterococcus Faecium NOT DETECTED NOT DETECTED Final   Listeria monocytogenes NOT DETECTED NOT DETECTED Final   Staphylococcus species NOT DETECTED NOT DETECTED Final   Staphylococcus aureus (BCID) NOT DETECTED NOT DETECTED Final   Staphylococcus epidermidis NOT DETECTED NOT DETECTED Final   Staphylococcus lugdunensis NOT DETECTED NOT DETECTED Final   Streptococcus species NOT DETECTED NOT DETECTED Final   Streptococcus agalactiae NOT DETECTED NOT DETECTED Final   Streptococcus pneumoniae NOT DETECTED NOT DETECTED Final   Streptococcus pyogenes NOT DETECTED NOT DETECTED Final   A.calcoaceticus-baumannii NOT DETECTED NOT DETECTED Final   Bacteroides fragilis NOT DETECTED NOT DETECTED Final   Enterobacterales NOT DETECTED NOT DETECTED Final   Enterobacter cloacae complex NOT DETECTED NOT DETECTED Final   Escherichia coli NOT DETECTED NOT DETECTED Final   Klebsiella aerogenes NOT DETECTED NOT DETECTED Final   Klebsiella oxytoca NOT DETECTED NOT DETECTED Final   Klebsiella pneumoniae NOT DETECTED NOT DETECTED Final   Proteus species NOT DETECTED NOT DETECTED Final   Salmonella species NOT DETECTED NOT DETECTED Final   Serratia marcescens NOT DETECTED NOT DETECTED Final   Haemophilus influenzae NOT DETECTED NOT DETECTED Final   Neisseria meningitidis NOT DETECTED NOT DETECTED Final   Pseudomonas aeruginosa NOT DETECTED NOT DETECTED Final   Stenotrophomonas maltophilia NOT DETECTED NOT DETECTED Final   Candida albicans NOT DETECTED NOT DETECTED Final   Candida auris NOT DETECTED NOT DETECTED Final   Candida glabrata NOT DETECTED NOT DETECTED Final   Candida krusei NOT DETECTED NOT DETECTED Final   Candida parapsilosis NOT DETECTED NOT DETECTED Final   Candida tropicalis NOT DETECTED NOT DETECTED Final   Cryptococcus neoformans/gattii NOT DETECTED NOT DETECTED Final   Vancomycin resistance NOT DETECTED NOT DETECTED Final    Comment: Performed  at [facility], [address], [place], [place] [address]  Urine Culture     Status: Abnormal   Collection Time: 08/01/23  5:02 PM   Specimen: Urine, Random  Result Value Ref Range Status   Specimen Description   Final    URINE, RANDOM Performed at [facility], [address] [place], [place] [address]    Special Requests   Final    NONE Reflexed from [id number] Performed at [facility], [address] [place], [place] [address]    Culture (A)  Final    <10,000 COLONIES/mL INSIGNIFICANT GROWTH Performed at [facility], [address], [place], [place] [address]    Report Status 08/03/2023 FINAL  Final  Culture, blood (routine x 2)     Status: None (Preliminary result)  Collection Time: 08/01/23  5:09 PM   Specimen: BLOOD  Result Value Ref Range Status   Specimen Description   Final    BLOOD BLOOD RIGHT HAND Performed at [facility], [address] [place], [place] [address]    Special Requests   Final    BOTTLES DRAWN AEROBIC ONLY Blood Culture results may not be optimal due to an inadequate volume of blood received in culture bottles Performed at [facility], [address], [place], [place] [address]    Culture   Final    NO GROWTH 4 DAYS Performed at [facility], [address] [address], [place], [place] [address]    Report Status PENDING  Incomplete  MRSA Next Gen by PCR, Nasal     Status: None   Collection Time: 08/01/23  9:15 PM   Specimen: Nasal Mucosa; Nasal Swab  Result Value Ref Range Status   MRSA by PCR Next Gen NOT DETECTED NOT DETECTED Final    Comment: (NOTE) The GeneXpert MRSA Assay (FDA approved for NASAL specimens only), is one component of a comprehensive MRSA colonization surveillance program. It is not intended to diagnose MRSA infection nor to guide or monitor treatment for MRSA infections. Test performance is not FDA approved in patients less than 61  years old. Performed at [facility], [address] [place], [place] [address]   Culture, blood (Routine X 2) w Reflex to ID Panel     Status: None (Preliminary result)   Collection Time: 08/03/23  3:03 AM   Specimen: BLOOD  Result Value Ref Range Status   Specimen Description   Final    BLOOD BLOOD RIGHT ARM Performed at [facility], [address], [place], [place] [address]    Special Requests   Final    BOTTLES DRAWN AEROBIC AND ANAEROBIC Blood Culture adequate volume Performed at [facility], [address], [place], [place] [address]    Culture   Final    NO GROWTH 2 DAYS Performed at [facility], [address] [place], [place] [address]    Report Status PENDING  Incomplete  Culture, blood (Routine X 2) w Reflex to ID Panel     Status: None (Preliminary result)   Collection Time: 08/03/23  5:49 AM   Specimen: BLOOD RIGHT ARM  Result Value Ref Range Status   Specimen Description   Final    BLOOD RIGHT ARM Performed at [facility], [address] [place], [place] [address]    Special Requests   Final    BOTTLES DRAWN AEROBIC AND ANAEROBIC Blood Culture results may not be optimal due to an inadequate volume of blood received in culture bottles Performed at [facility], [address] [place], [place] [address]    Culture   Final    NO GROWTH 2 DAYS Performed at [facility], [address] [place], [place] [address]    Report Status PENDING  Incomplete    Radiology Studies: [place] EKG SITE RITE Result Date: 08/05/2023 If Site Rite image not attached, placement could not be confirmed due to current cardiac rhythm.  Scheduled Meds:  amiodarone  200 mg Oral Daily   amitriptyline  50 mg Oral QHS   apixaban  5 mg Oral BID   Chlorhexidine Gluconate Cloth  6 each Topical Daily   digoxin  0.125 mg Oral Daily   diltiazem  120 mg Oral Daily  docusate sodium  100 mg Oral  BID   levothyroxine  88 mcg Oral QAC breakfast   midodrine  5 mg Oral TID WC   mometasone-formoterol  2 puff Inhalation BID   mouth rinse  15 mL Mouth Rinse 4 times per day   predniSONE  20 mg Oral Q breakfast   torsemide  40 mg Oral BID   Continuous Infusions:  ampicillin (OMNIPEN) IV Stopped (08/05/23 1714)    LOS: 4 days   Marguerita Merles, DO [facility] Available via Epic secure chat 7am-7pm After these hours, please refer to coverage provider listed on [email or web address] 08/05/2023, 8:38 PM

## 2023-08-05 NOTE — Progress Notes (Signed)
Orthopedic Tech Progress Note Patient Details:  Denise Mckenzie June 27, 1946 [id number]  Ortho Devices Type of Ortho Device: Radio broadcast assistant Ortho Device/Splint Location: bi lat unna boot applied Ortho Device/Splint Interventions: Ordered, Application, Adjustment   Post Interventions Patient Tolerated: Well Instructions Provided: Adjustment of device, Care of device  Kizzie Fantasia 08/05/2023, 9:56 AM

## 2023-08-06 ENCOUNTER — Inpatient Hospital Stay (HOSPITAL_COMMUNITY): Payer: Medicare Other

## 2023-08-06 ENCOUNTER — Other Ambulatory Visit: Payer: Self-pay

## 2023-08-06 DIAGNOSIS — K5900 Constipation, unspecified: Secondary | ICD-10-CM

## 2023-08-06 DIAGNOSIS — R7881 Bacteremia: Secondary | ICD-10-CM | POA: Diagnosis not present

## 2023-08-06 DIAGNOSIS — I483 Typical atrial flutter: Secondary | ICD-10-CM

## 2023-08-06 DIAGNOSIS — R195 Other fecal abnormalities: Secondary | ICD-10-CM

## 2023-08-06 DIAGNOSIS — J9622 Acute and chronic respiratory failure with hypercapnia: Secondary | ICD-10-CM | POA: Diagnosis not present

## 2023-08-06 DIAGNOSIS — J410 Simple chronic bronchitis: Secondary | ICD-10-CM | POA: Diagnosis not present

## 2023-08-06 DIAGNOSIS — I5032 Chronic diastolic (congestive) heart failure: Secondary | ICD-10-CM | POA: Diagnosis not present

## 2023-08-06 LAB — COMPREHENSIVE METABOLIC PANEL
ALT: 34 U/L (ref 0–44)
ALT: 25 U/L (ref 0–44)
AST: 23 U/L (ref 15–41)
AST: 19 U/L (ref 15–41)
Albumin: 2 g/dL — ABNORMAL LOW (ref 3.5–5.0)
Albumin: 1.8 g/dL — ABNORMAL LOW (ref 3.5–5.0)
Alkaline Phosphatase: 62 U/L (ref 38–126)
Alkaline Phosphatase: 38 U/L (ref 38–126)
Anion gap: 10 (ref 5–15)
Anion gap: 6 (ref 5–15)
BUN: 23 mg/dL (ref 8–23)
BUN: 24 mg/dL — ABNORMAL HIGH (ref 8–23)
CO2: 37 mmol/L — ABNORMAL HIGH (ref 22–32)
CO2: 32 mmol/L (ref 22–32)
Calcium: 7.4 mg/dL — ABNORMAL LOW (ref 8.9–10.3)
Calcium: 6 mg/dL — CL (ref 8.9–10.3)
Chloride: 94 mmol/L — ABNORMAL LOW (ref 98–111)
Chloride: 103 mmol/L (ref 98–111)
Creatinine, Ser: 0.49 mg/dL (ref 0.44–1.00)
Creatinine, Ser: 0.5 mg/dL (ref 0.44–1.00)
GFR, Estimated: >60 mL/min (ref >60)
GFR, Estimated: >60 mL/min (ref >60)
Glucose, Bld: 108 mg/dL — ABNORMAL HIGH (ref 70–99)
Glucose, Bld: 128 mg/dL — ABNORMAL HIGH (ref 70–99)
Potassium: 3.3 mmol/L — ABNORMAL LOW (ref 3.5–5.1)
Potassium: 3.1 mmol/L — ABNORMAL LOW (ref 3.5–5.1)
Sodium: 141 mmol/L (ref 135–145)
Sodium: 141 mmol/L (ref 135–145)
Total Bilirubin: 1.1 mg/dL (ref <1.2)
Total Bilirubin: 0.9 mg/dL (ref <1.2)
Total Protein: 4.1 g/dL — ABNORMAL LOW (ref 6.5–8.1)
Total Protein: 3.2 g/dL — ABNORMAL LOW (ref 6.5–8.1)

## 2023-08-06 LAB — CBC WITH DIFFERENTIAL/PLATELET
Abs Immature Granulocytes: 0.27 10*3/uL — ABNORMAL HIGH (ref 0.00–0.07)
Basophils Absolute: 0.1 10*3/uL (ref 0.0–0.1)
Basophils Relative: 0 %
Eosinophils Absolute: 0 10*3/uL (ref 0.0–0.5)
Eosinophils Relative: 0 %
HCT: 43.1 % (ref 36.0–46.0)
Hemoglobin: 13 g/dL (ref 12.0–15.0)
Immature Granulocytes: 1 %
Lymphocytes Relative: 2 %
Lymphs Abs: 0.4 10*3/uL — ABNORMAL LOW (ref 0.7–4.0)
MCH: 28.8 pg (ref 26.0–34.0)
MCHC: 30.2 g/dL (ref 30.0–36.0)
MCV: 95.6 fL (ref 80.0–100.0)
Monocytes Absolute: 1 10*3/uL (ref 0.1–1.0)
Monocytes Relative: 5 %
Neutro Abs: 20.2 10*3/uL — ABNORMAL HIGH (ref 1.7–7.7)
Neutrophils Relative %: 92 %
Platelets: 108 10*3/uL — ABNORMAL LOW (ref 150–400)
RBC: 4.51 MIL/uL (ref 3.87–5.11)
RDW: 16.5 % — ABNORMAL HIGH (ref 11.5–15.5)
WBC: 22 10*3/uL — ABNORMAL HIGH (ref 4.0–10.5)
nRBC: 0 % (ref 0.0–0.2)

## 2023-08-06 LAB — MAGNESIUM: Magnesium: 2.1 mg/dL (ref 1.7–2.4)

## 2023-08-06 LAB — OCCULT BLOOD X 1 CARD TO LAB, STOOL: Fecal Occult Bld: POSITIVE — AB

## 2023-08-06 LAB — GLUCOSE, CAPILLARY
Glucose-Capillary: 112 mg/dL — ABNORMAL HIGH (ref 70–99)
Glucose-Capillary: 110 mg/dL — ABNORMAL HIGH (ref 70–99)
Glucose-Capillary: 113 mg/dL — ABNORMAL HIGH (ref 70–99)
Glucose-Capillary: 111 mg/dL — ABNORMAL HIGH (ref 70–99)
Glucose-Capillary: 148 mg/dL — ABNORMAL HIGH (ref 70–99)

## 2023-08-06 LAB — CULTURE, BLOOD (ROUTINE X 2): Culture: NO GROWTH

## 2023-08-06 LAB — HEMOGLOBIN AND HEMATOCRIT, BLOOD
HCT: 34.8 % — ABNORMAL LOW (ref 36.0–46.0)
Hemoglobin: 10.9 g/dL — ABNORMAL LOW (ref 12.0–15.0)

## 2023-08-06 LAB — PHOSPHORUS: Phosphorus: 2.6 mg/dL (ref 2.5–4.6)

## 2023-08-06 MED ORDER — POTASSIUM CHLORIDE CRYS ER 20 MEQ PO TBCR
40.0000 meq | EXTENDED_RELEASE_TABLET | Freq: Two times a day (BID) | ORAL | Status: AC
  Administered 2023-08-06 (×2): 40 meq via ORAL
  Filled 2023-08-06 (×2): qty 2

## 2023-08-06 MED ORDER — SODIUM CHLORIDE 0.9% FLUSH
10.0000 mL | Freq: Two times a day (BID) | INTRAVENOUS | Status: DC
  Administered 2023-08-06: 30 mL
  Administered 2023-08-07: 10 mL
  Administered 2023-08-07: 35 mL
  Administered 2023-08-08: 30 mL
  Administered 2023-08-08: 40 mL
  Administered 2023-08-09 – 2023-08-13 (×10): 10 mL

## 2023-08-06 MED ORDER — CALCIUM GLUCONATE-NACL 1-0.675 GM/50ML-% IV SOLN
1.0000 g | Freq: Once | INTRAVENOUS | Status: AC
  Administered 2023-08-06: 1000 mg via INTRAVENOUS
  Filled 2023-08-06: qty 50

## 2023-08-06 MED ORDER — SODIUM CHLORIDE 0.9% FLUSH: 10.0000 mL | INTRAVENOUS | Status: DC | PRN

## 2023-08-06 MED ORDER — BISACODYL 10 MG RE SUPP
10.0000 mg | Freq: Every day | RECTAL | Status: DC | PRN
  Administered 2023-08-06: 10 mg via RECTAL
  Filled 2023-08-06: qty 1

## 2023-08-06 MED ORDER — KCL IN DEXTROSE-NACL 20-5-0.9 MEQ/L-%-% IV SOLN
INTRAVENOUS | Status: AC
Start: 2023-08-06 — End: 2023-08-07
  Filled 2023-08-06: qty 1000

## 2023-08-06 MED ORDER — MIDODRINE HCL 5 MG PO TABS
10.0000 mg | ORAL_TABLET | Freq: Three times a day (TID) | ORAL | Status: DC
  Administered 2023-08-07 – 2023-08-11 (×12): 10 mg via ORAL
  Filled 2023-08-06 (×12): qty 2

## 2023-08-06 MED ORDER — POLYETHYLENE GLYCOL 3350 17 G PO PACK
17.0000 g | PACK | Freq: Two times a day (BID) | ORAL | Status: DC
  Administered 2023-08-08: 17 g via ORAL
  Filled 2023-08-06: qty 1

## 2023-08-06 MED ORDER — ALBUMIN HUMAN 25 % IV SOLN
25.0000 g | Freq: Once | INTRAVENOUS | Status: AC
  Administered 2023-08-06: 25 g via INTRAVENOUS
  Filled 2023-08-06: qty 100

## 2023-08-06 MED ORDER — SENNOSIDES-DOCUSATE SODIUM 8.6-50 MG PO TABS
1.0000 | ORAL_TABLET | Freq: Two times a day (BID) | ORAL | Status: DC
  Administered 2023-08-10 (×2): 1 via ORAL
  Filled 2023-08-06 (×3): qty 1

## 2023-08-06 MED ORDER — POTASSIUM CHLORIDE CRYS ER 20 MEQ PO TBCR: 40.0000 meq | EXTENDED_RELEASE_TABLET | Freq: Once | ORAL | Status: DC

## 2023-08-06 MED ORDER — HEPARIN (PORCINE) 25000 UT/250ML-% IV SOLN
800.0000 [IU]/h | INTRAVENOUS | Status: DC
  Administered 2023-08-06: 800 [IU]/h via INTRAVENOUS
  Filled 2023-08-06: qty 250

## 2023-08-06 NOTE — Progress Notes (Signed)
PHARMACY - ANTICOAGULATION CONSULT NOTE  Pharmacy Consult for IV heparin Indication: Afib  No Known Allergies  Patient Measurements: Height: 5\' 2"  (157.5 cm) Weight: 69.5 kg (153 lb 3.5 oz) IBW/kg (Calculated) : 50.1 Heparin Dosing Weight: 65 kg  Vital Signs: Temp: 98.8 F (37.1 C) (12/20 1200) Temp Source: Oral (12/20 1200) BP: 105/31 (12/20 1800) Pulse Rate: 84 (12/20 1823)  Labs: Recent Labs    08/04/23 0259 08/05/23 0300 08/06/23 0250 08/06/23 1733  HGB 13.3 14.1 13.0 10.9*  HCT 43.8 45.3 43.1 34.8*  PLT 128* 117* 108*  --   CREATININE 0.63 0.49 0.49  --     Estimated Creatinine Clearance: 52.1 mL/min (by C-G formula based on SCr of 0.49 mg/dL).   Medical History: Past Medical History:  Diagnosis Date   Acute exacerbation of CHF (congestive heart failure) (HCC) 07/12/2023   Arthritis    Breast cancer (HCC) dx'd 09/2015   COPD (chronic obstructive pulmonary disease) (HCC)    Coronary artery disease    Depression    Diabetes mellitus (HCC) 10/22/2015   Full dentures    full upper dentures, plate on bottom   History of bronchitis    History of pneumonia    Hyperlipidemia    Hypertension    Hypothyroidism 10/22/2015   lung ca dx;d 09/2015   lung cancer   Myocardial infarction [facility]) July 1995   Osteoporosis 10/22/2015   Personal history of radiation therapy    Stress incontinence    Uterine cancer (HCC) dx'd 1968    Medications:  Medications Prior to Admission  Medication Sig Dispense Refill Last Dose/Taking   albuterol (VENTOLIN HFA) 108 (90 Base) MCG/ACT inhaler Inhale 2 puffs into the lungs 4 (four) times daily as needed for shortness of breath.   08/01/2023 Evening   amiodarone (PACERONE) 400 MG tablet Take 1 tablet (400 mg total) by mouth 2 (two) times daily.   08/01/2023 Morning   amitriptyline (ELAVIL) 50 MG tablet Take 50 mg by mouth at bedtime.     07/31/2023 Bedtime   apixaban (ELIQUIS) 5 MG TABS tablet Take 1 tablet (5 mg total) by mouth 2 (two)  times daily.   08/01/2023 at  9:12 AM   atorvastatin (LIPITOR) 40 MG tablet Take 40 mg by mouth at bedtime.    07/31/2023 Bedtime   busPIRone (BUSPAR) 5 MG tablet Take 5 mg by mouth 2 (two) times daily.   07/30/2023 Morning   Calamine-Zinc Oxide LOTN Apply 1 application  topically See admin instructions. On Monday and Thursday   07/29/2023 Evening   Calcium Citrate-Vitamin D (CALCIUM + D PO) Take 2 tablets by mouth 3 (three) times daily. Each tablet Calcium 300 mg, Vitamin D3 250 I.U.   08/01/2023 Evening   Cholecalciferol (VITAMIN D3) 50 MCG (2000 UT) CAPS Take 2,000 Units by mouth daily.   08/01/2023 Morning   digoxin (LANOXIN) 0.125 MG tablet Take 1 tablet (0.125 mg total) by mouth daily.   07/31/2023 Evening   diltiazem (CARDIZEM CD) 120 MG 24 hr capsule Take 1 capsule (120 mg total) by mouth daily.   08/01/2023 Morning   docusate sodium (COLACE) 100 MG capsule Take 1 capsule (100 mg total) by mouth 2 (two) times daily. (Patient taking differently: Take 100 mg by mouth 2 (two) times daily as needed for moderate constipation.)   Taking Differently   doxycycline (ADOXA) 100 MG tablet Take 100 mg by mouth 2 (two) times daily.   08/01/2023 Morning   FARXIGA 10 MG TABS tablet TAKE  1 TABLET BY MOUTH DAILY BEFORE BREAKFAST. 90 tablet 1 08/01/2023 Morning   ketoconazole (NIZORAL) 2 % cream Apply 1 application topically daily. (Patient taking differently: Apply 1 application  topically daily as needed for irritation.) 15 g 6 Taking Differently   KLOR-CON M20 20 MEQ tablet TAKE 1 TABLET BY MOUTH TWICE A DAY 180 tablet 1 08/01/2023 Morning   lactulose (CHRONULAC) 10 GM/15ML solution Take 30 mLs (20 g total) by mouth 2 (two) times daily as needed for mild constipation.   Taking As Needed   levalbuterol (XOPENEX) 0.63 MG/3ML nebulizer solution Take 3 mLs (0.63 mg total) by nebulization every 6 (six) hours as needed for wheezing or shortness of breath.   Taking As Needed   levothyroxine (SYNTHROID, LEVOTHROID)  88 MCG tablet Take 88 mcg by mouth daily before breakfast.    08/01/2023 Morning   metFORMIN (GLUCOPHAGE) 500 MG tablet Take 500 mg by mouth daily.  4 08/01/2023 Morning   midodrine (PROAMATINE) 5 MG tablet Take 1 tablet (5 mg total) by mouth 3 (three) times daily with meals.   08/01/2023 Evening   mometasone-formoterol (DULERA) 200-5 MCG/ACT AERO Inhale 2 puffs into the lungs 2 (two) times daily.   08/01/2023 Morning   nitroGLYCERIN (NITROSTAT) 0.4 MG SL tablet Place 1 tablet (0.4 mg total) under the tongue every 5 (five) minutes as needed for chest pain. Reported on 01/28/2016 30 tablet 3 Taking As Needed   torsemide (DEMADEX) 20 MG tablet Take 2 tablets (40 mg total) by mouth 2 (two) times daily. 120 tablet 5 08/01/2023 Evening   [EXPIRED] predniSONE (DELTASONE) 10 MG tablet Take 4 tablets (40 mg total) by mouth daily with breakfast for 2 days, THEN 3 tablets (30 mg total) daily with breakfast for 2 days, THEN 2 tablets (20 mg total) daily with breakfast for 2 days, THEN 1 tablet (10 mg total) daily with breakfast for 2 days. (Patient not taking: Reported on 08/01/2023)   Not Taking   Scheduled:   amiodarone  200 mg Oral Daily   amitriptyline  50 mg Oral QHS   Chlorhexidine Gluconate Cloth  6 each Topical Daily   digoxin  0.125 mg Oral Daily   docusate sodium  100 mg Oral BID   levothyroxine  88 mcg Oral QAC breakfast   [START ON 08/07/2023] midodrine  10 mg Oral TID WC   mometasone-formoterol  2 puff Inhalation BID   mouth rinse  15 mL Mouth Rinse 4 times per day   polyethylene glycol  17 g Oral BID   potassium chloride  40 mEq Oral BID   senna-docusate  1 tablet Oral BID   PRN: acetaminophen **OR** acetaminophen, alum & mag hydroxide-simeth, antiseptic oral rinse, benzonatate, bisacodyl, hydrALAZINE, levalbuterol, lip balm, metoprolol tartrate, nitroGLYCERIN, ondansetron **OR** ondansetron (ZOFRAN) IV, mouth rinse, sodium chloride flush, traZODone  Assessment: 34 yoF with PMH CAD s/p  CABG, COPD, DM2, hypothyroid, HTN/HLD, Hx breast & lung CA, admitted with AECHF. Recently started on Eliquis in November for Afib/RVR and underwent DCCV 11/29. Readmitted with persistent Aflutter; Cardiology planning repeat DCCV once loaded with amiodarone.   On 12/20, some red streaking was noted on stool and FOBT returned positive. Given this along with continued hypotension despite midodrine, Pharmacy consulted to transition Eliquis to IV heparin infusion.  Baseline INR, aPTT: not done Prior anticoagulation: Eliquis 5 mg PO BID, last dose 12/20 @ 0900  Significant events:  Today, 08/06/2023: CBC: Hgb & plt both slightly low and steadily declining SCr stable WNL No bleeding (  apart from that mentioned above) or infusion issues per nursing  Goal of Therapy: Heparin level 0.3-0.7 units/ml Monitor platelets by anticoagulation protocol: Yes  Plan: Starting at 21:00 tonight (12 hr after last Eliquis dose), begin heparin 800 units/hr IV infusion (12 units/kg/hr) Check aPTT 8 hrs after start Daily CBC & daily heparin level; aPTTs as needed while heparin levels remain confounded by recent DOAC Monitor for signs of bleeding or thrombosis  Bernadene Person, PharmD, BCPS [phone] 08/06/2023, 7:34 PM '

## 2023-08-06 NOTE — Progress Notes (Signed)
   08/06/23 0028  BiPAP/CPAP/SIPAP  BiPAP/CPAP/SIPAP V60  Reason BIPAP/CPAP not in use Other(comment) (Ordered PRN, Sarah, RN aware and will notify if needed.)  BiPAP/CPAP /SiPAP Vitals  Temp 97.8 F (36.6 C)  MEWS Score/Color  MEWS Score 1  MEWS Score Color Green

## 2023-08-06 NOTE — Progress Notes (Signed)
Physical Therapy Treatment Patient Details Name: Denise Mckenzie MRN: [id number] DOB: May 15, 1946 Today's Date: 08/06/2023   History of Present Illness This is a 77 year old female presented from [facility] with shornesst of breath, wheezing and altered. Found to have acute on chronic respiratory failure with hypercapnia and HCAP.  PHMx: CAD ( h/o CABG 51), COPD, HTN, HLD, hypothyroidism, T2DM, chronic respiratory failure 3L,  h/o breast and lung cancer.  Patient recently admitted 11/25 - 12/6 with anasarca, CHF exacerbation, A-fib with RVR.    PT Comments  Pt seen in ICU RM # [id number] General Comments: AxO x 2 nervous/anxious/fearful of falling.  Daughter stated pt has had 3 falls in the past month all during staff assisted transfers.   Pt from [facility] after [facility] admit 11/25 - 12/6.  Readmit to [facility] 08/01/23 Attempted sitting EOB was very difficult.  General bed mobility comments: pt is Total Assist for all bed mobility as well as limited sitting balance required Max Assist no more that 2 min tolerance.  Posture and kyphotic and collapsed.  B UE are weak and unable to support.  Pt slowing "sliding" off EOB requiring Total Assist to scoot back.  Pt is alos 5'2" General transfer comment: attempted sit to stand via "Bear Hug" pt was UNABLE to have the strength to support her weight and was unable to lift buttocks off bed despite 2 attempts.  Profound weakness.  Rec LIFT. General Gait Details: per daughter, pt has been non amb > 3 weeks.  Last documenmted amb activity was on 07/22/23 PT session @ [facility] 12 feet + 2 Mod Assist. Used Max Move Lift to assist OOB to recliner.  Multiple pillows to position to comfort.   LPT has rec return to SNF.    If plan is discharge home, recommend the following: Two people to help with walking and/or transfers;A little help with bathing/dressing/bathroom;Assistance with cooking/housework;Help with stairs or ramp for entrance;Assist for  transportation   Can travel by private vehicle     No  Equipment Recommendations  None recommended by PT    Recommendations for Other Services       Precautions / Restrictions Precautions Precautions: Fall Precaution Comments: non amb > 3 weeks Restrictions Weight Bearing Restrictions Per Provider Order: No     Mobility  Bed Mobility Overal bed mobility: Needs Assistance Bed Mobility: Rolling, Supine to Sit Rolling: Total assist, +2 for physical assistance, +2 for safety/equipment   Supine to sit: Total assist, +2 for physical assistance Sit to supine: Total assist, +2 for physical assistance   General bed mobility comments: pt is Total Assist for all bed mobility as well as limited sitting balance required Max Assist no more that 2 min tolerance.  Posture and kyphotic and collapsed.  B UE are weak and unable to support.  Pt slowing "sliding" off EOB requiring Total Assist to scoot back.  Pt is alos 5'2"    Transfers Overall transfer level: Needs assistance Equipment used: None Transfers: Sit to/from Stand Sit to Stand: Max assist, Total assist           General transfer comment: attempted sit to stand via "Bear Hug" pt was UNABLE to have the strength to support her weight and was unable to lift buttocks off bed despite 2 attempts.  Profound weakness.  Rec LIFT.    Ambulation/Gait               General Gait Details: per daughter, pt has been non  amb > 3 weeks.  Last documenmted amb activity was on 07/22/23 PT session @ [facility] 12 feet + 2 Mod Assist.   Stairs             Wheelchair Mobility     Tilt Bed    Modified Rankin (Stroke Patients Only)       Balance                                            Cognition Arousal: Alert Behavior During Therapy: WFL for tasks assessed/performed, Anxious Overall Cognitive Status: Within Functional Limits for tasks assessed Area of Impairment: Safety/judgement                          Safety/Judgement: Decreased awareness of deficits     General Comments: AxO x 2 nervous/anxious/fearful of falling        Exercises      General Comments        Pertinent Vitals/Pain Pain Assessment Pain Assessment: Faces Faces Pain Scale: Hurts little more Pain Location: buttocks Pain Descriptors / Indicators: Sore, Grimacing Pain Intervention(s): Monitored during session, Repositioned    Home Living                          Prior Function            PT Goals (current goals can now be found in the care plan section) Progress towards PT goals: Progressing toward goals    Frequency    Min 1X/week      PT Plan      Co-evaluation              AM-PAC PT "6 Clicks" Mobility   Outcome Measure  Help needed turning from your back to your side while in a flat bed without using bedrails?: Total Help needed moving from lying on your back to sitting on the side of a flat bed without using bedrails?: Total Help needed moving to and from a bed to a chair (including a wheelchair)?: Total Help needed standing up from a chair using your arms (e.g., wheelchair or bedside chair)?: Total Help needed to walk in hospital room?: Total Help needed climbing 3-5 steps with a railing? : Total 6 Click Score: 6    End of Session Equipment Utilized During Treatment: Gait belt;Oxygen Activity Tolerance: Patient limited by fatigue Patient left: with call bell/phone within reach;with bed alarm set;with family/visitor present;in chair Nurse Communication: Mobility status;Need for lift equipment PT Visit Diagnosis: Unsteadiness on feet (R26.81);Other abnormalities of gait and mobility (R26.89);Muscle weakness (generalized) (M62.81);Repeated falls (R29.6)     Time: 4401-0272 PT Time Calculation (min) (ACUTE ONLY): 26 min  Charges:    $Therapeutic Activity: 23-37 mins PT General Charges $$ ACUTE PT VISIT: 1 Visit                     Felecia Shelling   PTA Acute  Rehabilitation Services Office M-F          [phone]

## 2023-08-06 NOTE — Progress Notes (Signed)
Patient was observed drinking water and patient immediately began coughing after the first swallow. Patient states that is normal for her and she has never been evaluated with swallowing. This nurse ordered swallow eval.

## 2023-08-06 NOTE — Progress Notes (Signed)
PROGRESS NOTE    Denise Mckenzie  UJW:[id number] DOB: [birth year] DOA: 08/01/2023 PCP: Burton Apley, MD   Brief Narrative:  The patient is a 77 year old female with past medical history of CAD ( h/o CABG 43), COPD, HTN, HLD, hypothyroidism, T2DM, chronic respiratory failure 3L,  h/o breast and lung cancer.  Patient recently admitted 11/25 - 12/6 with anasarca, CHF exacerbation, A-fib with RVR.  Patient treated with diuresis.  She underwent TEE/DCCV for A-fib.  Patient discharged to [facility].  Patient brought back in after she became short of breath, wheezing and altered.  Initially noted to be in hypercapnic respiratory failure requiring BiPAP.  CT suggestive of possible pneumonia and started on vancomycin and cefepime.  Eventually blood cultures growing GPC/Enterococcus faecalis therefore transition to Unasyn per ID recommendations.  Cardiology following the patient as well.  Echocardiogram showed preserved EF without vegetation.  08/05/2023 cardiology has now signed off the case and recommending anticoagulation and continue amiodarone for now.  She is now retaining some urine still we will do an In-N-Out x 3 and then if she fails another time we will place Foley catheter.  Infectious diseases recommending ampicillin IV every 24 hours with an end date of 08/16/2023 and has made recommendations for OPAT.  She continues to have some hypoglycemia so we will hold her insulin regimen.  Unna boots have been applied now  08/06/2023.  Patient is more hypotensive so given low-dose IV fluids and increase midodrine as well as give a dose of albumin.  Antihypertensives including Cardizem and torsemide have been held.  FOBT is positive now after she had a large bowel movement and will add suppositories and discussed with GI who feels no role for colonoscopy.  Given her low blood pressure discussed with pulmonary who also feel that there is no role for pressors at this point.  Will need to continue monitor  her blood count carefully  Assessment & Plan:  Principal Problem:   Acute on chronic respiratory failure with hypercapnia (HCC) Active Problems:   Coronary artery disease   Hypertension   Hypothyroidism   Primary cancer of right upper lobe of lung (HCC)   COPD (chronic obstructive pulmonary disease) (HCC)   Chronic diastolic (congestive) heart failure (HCC)   PAF (paroxysmal atrial fibrillation) (HCC)   HCAP (healthcare-associated pneumonia)   Hypotension   Enterococcus faecalis infection   Bacteremia   Typical atrial flutter (HCC)   Acute on chronic hypercapnic respiratory failure Healthcare acquired pneumonia Enterococcus faecalis bacteremia History of COPD -Weaned off BiPAP.  MRSA swab negative.  Blood cultures growing GPC/Enterococcus faecalis.  Antibiotics per ID.  Echo ef 65%, enlarged RV.  Seen by speech and swallow, recommending regular diet with aspiration precautions. -Placed on empiric prednisone daily for 5 days which is likely caused her leukocytosis to remain elevated; WBC Trend: Recent Labs  Lab 07/30/23 2126 08/01/23 1650 08/02/23 0257 08/03/23 0303 08/04/23 0259 08/05/23 0300 08/06/23 0250  WBC 20.9* 20.5* 19.6* 19.2* 18.0* 21.1* 22.0*  -ID Following recommending outpatient antibiotics with OPAT orders for ampicillin every 24 hours -COVID, flu, RSV negative, urine strep antigen-negative -Continue with Dulera 2 puffs IH twice daily as well as Xopenex 0.63 mg neb every 6 as needed for wheezing and shortness of breath -Given her hypotension will need to monitor carefully -PT OT recommending SNF  Hypotension -In setting of infection; will hold her antihypertensives with Cardizem and torsemide -Give gentle IV fluid hydration and give a dose of albumin -Increase Midodrine from 5 mg 3  times daily to 10 mg 3 times daily -Blood pressure goal with a systolic blood pressure greater than 90 -Monitor for further signs and symptoms bleeding -Discussed with pulmonary  who feels that pressors do not need to be initiated at this point and recommending close monitoring  Diabetes mellitus type 2 -Sliding scale and Accu-Cheks -CBG Trend: Recent Labs  Lab 08/05/23 1933 08/05/23 2313 08/06/23 0410 08/06/23 0749 08/06/23 1149 08/06/23 1618 08/06/23 1932  GLUCAP 132* 156* 112* 110* 113* 111* 148*   History of coronary artery disease with history of CABG in 1996 CHF with preserved EF with severe RV failure/dilation -Echo shows EF 65%, elevated PASP, severe RV failure.  Signs of third spacing but no obvious intravascular volume overload.  Continue home torsemide 40 mg orally daily and Unna boots -BNP Trend:  Recent Labs  Lab 07/12/23 1434 07/13/23 0038 07/25/23 2240 07/30/23 2126 08/01/23 1650 08/03/23 0303  BNP 2,311.4* 2,289.1* 1,292.5* 1,287.7* 862.2* 528.7*  -Home meds Eliquis, statin, Cardizem, Farxiga but will hold her Cardizem for now and Eliquis and changed to apixaban given FOBT positive GI bleeding  FOBT positive -Had a drop in her hemoglobin and had red blood smeared in her stool after she was constipated for quite a while -Hgb/Hct Trend: Recent Labs  Lab 08/01/23 1650 08/02/23 0257 08/03/23 0303 08/04/23 0259 08/05/23 0300 08/06/23 0250 08/06/23 1733  HGB 14.9 14.9 13.7 13.3 14.1 13.0 10.9*  HCT 47.5* 47.6* 43.4 43.8 45.3 43.1 34.8*  MCV 95.4 94.1 94.6 95.6 94.2 95.6  --   -Check anemia panel in the a.m. -Continue to monitor for signs and symptoms bleeding; has some GI bleeding noted -Repeat CBC in the a.m. -Discussed with GI Dr. Dulce Sellar who feels that there is no role for colonoscopy given how ill the patient is at this time and recommended that she has active GI bleeding they are recommending a stat CT angio of the abdomen pelvis  Paroxysmal atrial fibrillation status post cardioversion 07/16/2023 -Currently on amiodarone, digoxin, Cardizem (HOLD).  Continue Eliquis.  Seen by cardiology they have signed off and scheduled for  follow-up appointment outpatient setting -Reconsulted cardiology given her tachycardic rates and they felt that telemetry was repeatedly currently on flutter waves and with her ventricular rates consistently less than 100 bpm so now signed off the case again -Unfortunately given her hypotension we had to hold her Cardizem and torsemide  History of right upper lobe lung cancer History of breast cancer -Both in remission  Acute Urinary Retention -Has been wheelchair-bound basically since a few months ago -Do In and Out cath x 3 and if necessary will place Foley catheter and start her on tamsulosin if able -Foley had to be placed overnight  Hyperlipidemia -Holding Statin given slightly abnormal LFTs  Hypothyroidism -Continue with Levothyroxine 88 mcg po Daily  Abnormal LFTs, improved  -LFT Trend: Recent Labs  Lab 07/12/23 1500 08/01/23 1650 08/06/23 0250 08/06/23 1955  AST 38 47* 23 19  ALT 33 52* 34 25  -Continue to Monitor and Trend and repeat CMP in the AM  Thrombocytopenia, slowly dropping  -Platelet Count Trend: Recent Labs  Lab 07/30/23 2126 08/01/23 1650 08/02/23 0257 08/03/23 0303 08/04/23 0259 08/05/23 0300 08/06/23 0250  PLT 174 171 157 141* 128* 117* 108*  -Continue to Monitor and Trend and repeat CBC in the AM  Hypoalbuminemia -Patient's Albumin Trend: Recent Labs  Lab 07/12/23 1500 08/01/23 1650 08/06/23 0250 08/06/23 1955  ALBUMIN 3.9 2.5* 2.0* 1.8*  -Continue to Monitor and Trend  and repeat CMP in the AM  Overweight  -Complicates overall prognosis and care -Estimated body mass index is 28.02 kg/m as calculated from the following:   Height as of this encounter: 5\' 2"  (1.575 m).   Weight as of this encounter: 69.5 kg.  -Weight Loss and Dietary Counseling given   DVT prophylaxis: Anticoagulant with heparin drip    Code Status: Full Code Family Communication: As delineated as above  Disposition Plan:  Level of care: Stepdown Status is:  Inpatient Remains inpatient appropriate because: Needs further clinical improvement and ID and cardiology will sign off the case now   Consultants:  Infectious diseases Cardiology Discussed the case with pulmonary Dr. Vassie Loll Discussed the case with GI Dr. Dulce Sellar  Procedures:  As delineated as above  Antimicrobials:  Anti-infectives (From admission, onward)    Start     Dose/Rate Route Frequency Ordered Stop   08/04/23 1200  ampicillin (OMNIPEN) 2 g in sodium chloride 0.9 % 100 mL IVPB        2 g 300 mL/hr over 20 Minutes Intravenous Every 4 hours 08/04/23 1022     08/03/23 0600  Ampicillin-Sulbactam (UNASYN) 3 g in sodium chloride 0.9 % 100 mL IVPB  Status:  Discontinued        3 g 200 mL/hr over 30 Minutes Intravenous Every 6 hours 08/02/23 2139 08/04/23 1022   08/02/23 1900  vancomycin (VANCOCIN) IVPB 1000 mg/200 mL premix  Status:  Discontinued        1,000 mg 200 mL/hr over 60 Minutes Intravenous Every 24 hours 08/01/23 2028 08/02/23 1325   08/02/23 0600  piperacillin-tazobactam (ZOSYN) IVPB 3.375 g  Status:  Discontinued        3.375 g 12.5 mL/hr over 240 Minutes Intravenous Every 8 hours 08/01/23 2028 08/02/23 2139   08/01/23 1830  Vancomycin (VANCOCIN) 1,500 mg in sodium chloride 0.9 % 500 mL IVPB        1,500 mg 250 mL/hr over 120 Minutes Intravenous  Once 08/01/23 1743 08/02/23 0848   08/01/23 1800  ceFEPIme (MAXIPIME) 2 g in sodium chloride 0.9 % 100 mL IVPB        2 g 200 mL/hr over 30 Minutes Intravenous  Once 08/01/23 1743 08/01/23 1849       Subjective: Seen and examined at bedside and was complaining of not having a bowel movement in a few weeks now.  Was complaining of some shortness breath and felt fatigued.  No lightheadedness or dizziness.  Feels okay otherwise.  No nausea or vomiting.  Objective: Vitals:   08/06/23 1823 08/06/23 1828 08/06/23 2000 08/06/23 2137  BP:   (!) 97/29   Pulse: 84  67 88  Resp: (!) 27  13   Temp:   98.7 F (37.1 C)   TempSrc:    Oral   SpO2: 96% 96% 96%   Weight:      Height:        Intake/Output Summary (Last 24 hours) at 08/06/2023 2141 Last data filed at 08/06/2023 1250 Gross per 24 hour  Intake 755.33 ml  Output 725 ml  Net 30.33 ml   Filed Weights   08/02/23 1839  Weight: 69.5 kg   Examination: Physical Exam:  Constitutional: Overweight elderly chronically ill-appearing Caucasian female who appears uncomfortable Respiratory: Diminished to auscultation bilaterally with some coarse breath sounds and some mild crackles and some slight rhonchi.,  No visible wheezing or rales. Normal respiratory effort and patient is not tachypenic. No accessory muscle use.  Wearing  supplemental oxygen via nasal cannula Cardiovascular: RRR, no murmurs / rubs / gallops. S1 and S2 auscultated. No extremity edema Abdomen: Soft, non-tender, distended secondary to body habitus. Bowel sounds positive.  GU: Deferred. Musculoskeletal: No clubbing / cyanosis of digits/nails. No joint deformity upper and lower extremities and now has Unna boots on Skin: No rashes, lesions, ulcers on limited skin evaluation. No induration; Warm and dry.  Neurologic: CN 2-12 grossly intact with no focal deficits. Romberg sign and cerebellar reflexes not assessed.  Psychiatric: Normal judgment and insight. Alert and oriented x 3. Normal mood and appropriate affect.   Data Reviewed: I have personally reviewed following labs and imaging studies  CBC: Recent Labs  Lab 08/01/23 1650 08/02/23 0257 08/03/23 0303 08/04/23 0259 08/05/23 0300 08/06/23 0250 08/06/23 1733  WBC 20.5* 19.6* 19.2* 18.0* 21.1* 22.0*  --   NEUTROABS 18.2* 18.8*  --   --   --  20.2*  --   HGB 14.9 14.9 13.7 13.3 14.1 13.0 10.9*  HCT 47.5* 47.6* 43.4 43.8 45.3 43.1 34.8*  MCV 95.4 94.1 94.6 95.6 94.2 95.6  --   PLT 171 157 141* 128* 117* 108*  --    Basic Metabolic Panel: Recent Labs  Lab 08/02/23 0257 08/03/23 0303 08/04/23 0259 08/05/23 0300 08/06/23 0250  08/06/23 1955  NA 142 145 141 141 141 141  K 4.1 3.5 3.0* 3.7 3.3* 3.1*  CL 96* 97* 94* 93* 94* 103  CO2 37* 40* 39* 41* 37* 32  GLUCOSE 116* 93 71 70 108* 128*  BUN 41* 45* 33* 26* 23 24*  CREATININE 0.71 0.75 0.63 0.49 0.49 0.50  CALCIUM 8.0* 7.8* 7.2* 7.6* 7.4* 6.0*  MG 1.8 1.9 1.6* 2.5* 2.1  --   PHOS  --  2.7  --   --  2.6  --    GFR: Estimated Creatinine Clearance: 52.1 mL/min (by C-G formula based on SCr of 0.5 mg/dL). Liver Function Tests: Recent Labs  Lab 08/01/23 1650 08/06/23 0250 08/06/23 1955  AST 47* 23 19  ALT 52* 34 25  ALKPHOS 86 62 38  BILITOT 0.7 1.1 0.9  PROT 5.0* 4.1* 3.2*  ALBUMIN 2.5* 2.0* 1.8*   No results for input(s): "LIPASE", "AMYLASE" in the last 168 hours. No results for input(s): "AMMONIA" in the last 168 hours. Coagulation Profile: No results for input(s): "INR", "PROTIME" in the last 168 hours. Cardiac Enzymes: No results for input(s): "CKTOTAL", "CKMB", "CKMBINDEX", "TROPONINI" in the last 168 hours. BNP (last 3 results) Recent Labs    02/22/23 1136 07/02/23 1645 07/09/23 1131  PROBNP 5,414* 21,913* 33,547*   HbA1C: No results for input(s): "HGBA1C" in the last 72 hours. CBG: Recent Labs  Lab 08/06/23 0410 08/06/23 0749 08/06/23 1149 08/06/23 1618 08/06/23 1932  GLUCAP 112* 110* 113* 111* 148*   Lipid Profile: No results for input(s): "CHOL", "HDL", "LDLCALC", "TRIG", "CHOLHDL", "LDLDIRECT" in the last 72 hours. Thyroid Function Tests: No results for input(s): "TSH", "T4TOTAL", "FREET4", "T3FREE", "THYROIDAB" in the last 72 hours. Anemia Panel: No results for input(s): "VITAMINB12", "FOLATE", "FERRITIN", "TIBC", "IRON", "RETICCTPCT" in the last 72 hours. Sepsis Labs: Recent Labs  Lab 07/30/23 2208 08/01/23 1654 08/01/23 1844 08/02/23 0257  PROCALCITON  --   --   --  <0.10  LATICACIDVEN 1.4 2.4* 2.1*  --     Recent Results (from the past 240 hours)  Resp panel by RT-PCR (RSV, Flu A&B, Covid) Anterior Nasal Swab      Status: None   Collection Time:  07/30/23  9:26 PM   Specimen: Anterior Nasal Swab  Result Value Ref Range Status   SARS Coronavirus 2 by RT PCR NEGATIVE NEGATIVE Final   Influenza A by PCR NEGATIVE NEGATIVE Final   Influenza B by PCR NEGATIVE NEGATIVE Final    Comment: (NOTE) The Xpert Xpress SARS-CoV-2/FLU/RSV plus assay is intended as an aid in the diagnosis of influenza from Nasopharyngeal swab specimens and should not be used as a sole basis for treatment. Nasal washings and aspirates are unacceptable for Xpert Xpress SARS-CoV-2/FLU/RSV testing.  Fact Sheet for Patients: BloggerCourse.com  Fact Sheet for Healthcare Providers: SeriousBroker.it  This test is not yet approved or cleared by the [place] FDA and has been authorized for detection and/or diagnosis of SARS-CoV-2 by FDA under an Emergency Use Authorization (EUA). This EUA will remain in effect (meaning this test can be used) for the duration of the COVID-19 declaration under Section 564(b)(1) of the Act, 21 U.S.C. section 360bbb-3(b)(1), unless the authorization is terminated or revoked.     Resp Syncytial Virus by PCR NEGATIVE NEGATIVE Final    Comment: (NOTE) Fact Sheet for Patients: BloggerCourse.com  Fact Sheet for Healthcare Providers: SeriousBroker.it  This test is not yet approved or cleared by the [place] FDA and has been authorized for detection and/or diagnosis of SARS-CoV-2 by FDA under an Emergency Use Authorization (EUA). This EUA will remain in effect (meaning this test can be used) for the duration of the COVID-19 declaration under Section 564(b)(1) of the Act, 21 U.S.C. section 360bbb-3(b)(1), unless the authorization is terminated or revoked.  Performed at [facility], [address] [place], [place] [address]   Resp panel by RT-PCR (RSV, Flu A&B, Covid) Anterior Nasal Swab      Status: None   Collection Time: 08/01/23  4:43 PM   Specimen: Anterior Nasal Swab  Result Value Ref Range Status   SARS Coronavirus 2 by RT PCR NEGATIVE NEGATIVE Final    Comment: (NOTE) SARS-CoV-2 target nucleic acids are NOT DETECTED.  The SARS-CoV-2 RNA is generally detectable in upper respiratory specimens during the acute phase of infection. The lowest concentration of SARS-CoV-2 viral copies this assay can detect is 138 copies/mL. A negative result does not preclude SARS-Cov-2 infection and should not be used as the sole basis for treatment or other patient management decisions. A negative result may occur with  improper specimen collection/handling, submission of specimen other than nasopharyngeal swab, presence of viral mutation(s) within the areas targeted by this assay, and inadequate number of viral copies(<138 copies/mL). A negative result must be combined with clinical observations, patient history, and epidemiological information. The expected result is Negative.  Fact Sheet for Patients:  BloggerCourse.com  Fact Sheet for Healthcare Providers:  SeriousBroker.it  This test is no t yet approved or cleared by the [place] FDA and  has been authorized for detection and/or diagnosis of SARS-CoV-2 by FDA under an Emergency Use Authorization (EUA). This EUA will remain  in effect (meaning this test can be used) for the duration of the COVID-19 declaration under Section 564(b)(1) of the Act, 21 U.S.C.section 360bbb-3(b)(1), unless the authorization is terminated  or revoked sooner.       Influenza A by PCR NEGATIVE NEGATIVE Final   Influenza B by PCR NEGATIVE NEGATIVE Final    Comment: (NOTE) The Xpert Xpress SARS-CoV-2/FLU/RSV plus assay is intended as an aid in the diagnosis of influenza from Nasopharyngeal swab specimens and should not be used as a sole basis for  treatment. Nasal washings and aspirates are  unacceptable for Xpert Xpress SARS-CoV-2/FLU/RSV testing.  Fact Sheet for Patients: BloggerCourse.com  Fact Sheet for Healthcare Providers: SeriousBroker.it  This test is not yet approved or cleared by the [place] FDA and has been authorized for detection and/or diagnosis of SARS-CoV-2 by FDA under an Emergency Use Authorization (EUA). This EUA will remain in effect (meaning this test can be used) for the duration of the COVID-19 declaration under Section 564(b)(1) of the Act, 21 U.S.C. section 360bbb-3(b)(1), unless the authorization is terminated or revoked.     Resp Syncytial Virus by PCR NEGATIVE NEGATIVE Final    Comment: (NOTE) Fact Sheet for Patients: BloggerCourse.com  Fact Sheet for Healthcare Providers: SeriousBroker.it  This test is not yet approved or cleared by the [place] FDA and has been authorized for detection and/or diagnosis of SARS-CoV-2 by FDA under an Emergency Use Authorization (EUA). This EUA will remain in effect (meaning this test can be used) for the duration of the COVID-19 declaration under Section 564(b)(1) of the Act, 21 U.S.C. section 360bbb-3(b)(1), unless the authorization is terminated or revoked.  Performed at [facility], [address], [place], [place] [address]   Culture, blood (routine x 2)     Status: Abnormal   Collection Time: 08/01/23  4:50 PM   Specimen: BLOOD  Result Value Ref Range Status   Specimen Description   Final    BLOOD LEFT ANTECUBITAL Performed at [facility], [address] [place], [place] [address]    Special Requests   Final    BOTTLES DRAWN AEROBIC AND ANAEROBIC Blood Culture results may not be optimal due to an inadequate volume of blood received in culture bottles Performed at [facility], [address], [place], [place] [address]     Culture  Setup Time   Final    GRAM POSITIVE COCCI ANAEROBIC BOTTLE ONLY CRITICAL RESULT CALLED TO, READ BACK BY AND VERIFIED WITH: PHARMD CHRISTINE SHADE ON 08/02/23 @ 1814 BY DRT GRAM POSITIVE RODS AEROBIC BOTTLE ONLY CRITICAL RESULT CALLED TO, READ BACK BY AND VERIFIED WITH: PHARMD CHRISTINE SHADE ON 08/02/23 @ 1817 BY DRT    Culture (A)  Final    ENTEROCOCCUS FAECALIS CORYNEBACTERIUM STRIATUM Standardized susceptibility testing for this organism is not available. Performed at [facility], [address], [place], [place] [address]    Report Status 08/04/2023 FINAL  Final   Organism ID, Bacteria ENTEROCOCCUS FAECALIS  Final      Susceptibility   Enterococcus faecalis - MIC*    AMPICILLIN <=2 SENSITIVE Sensitive     VANCOMYCIN 1 SENSITIVE Sensitive     GENTAMICIN SYNERGY SENSITIVE Sensitive     * ENTEROCOCCUS FAECALIS  Blood Culture ID Panel (Reflexed)     Status: Abnormal   Collection Time: 08/01/23  4:50 PM  Result Value Ref Range Status   Enterococcus faecalis DETECTED (A) NOT DETECTED Final    Comment: CRITICAL RESULT CALLED TO, READ BACK BY AND VERIFIED WITH: PHARMD CHRISTINE SHADE ON 08/02/23 @ 1814 BY DRT    Enterococcus Faecium NOT DETECTED NOT DETECTED Final   Listeria monocytogenes NOT DETECTED NOT DETECTED Final   Staphylococcus species NOT DETECTED NOT DETECTED Final   Staphylococcus aureus (BCID) NOT DETECTED NOT DETECTED Final   Staphylococcus epidermidis NOT DETECTED NOT DETECTED Final   Staphylococcus lugdunensis NOT DETECTED NOT DETECTED Final   Streptococcus species NOT DETECTED NOT DETECTED Final   Streptococcus agalactiae NOT DETECTED NOT DETECTED  Final   Streptococcus pneumoniae NOT DETECTED NOT DETECTED Final   Streptococcus pyogenes NOT DETECTED NOT DETECTED Final   A.calcoaceticus-baumannii NOT DETECTED NOT DETECTED Final   Bacteroides fragilis NOT DETECTED NOT DETECTED Final   Enterobacterales NOT DETECTED NOT DETECTED Final   Enterobacter  cloacae complex NOT DETECTED NOT DETECTED Final   Escherichia coli NOT DETECTED NOT DETECTED Final   Klebsiella aerogenes NOT DETECTED NOT DETECTED Final   Klebsiella oxytoca NOT DETECTED NOT DETECTED Final   Klebsiella pneumoniae NOT DETECTED NOT DETECTED Final   Proteus species NOT DETECTED NOT DETECTED Final   Salmonella species NOT DETECTED NOT DETECTED Final   Serratia marcescens NOT DETECTED NOT DETECTED Final   Haemophilus influenzae NOT DETECTED NOT DETECTED Final   Neisseria meningitidis NOT DETECTED NOT DETECTED Final   Pseudomonas aeruginosa NOT DETECTED NOT DETECTED Final   Stenotrophomonas maltophilia NOT DETECTED NOT DETECTED Final   Candida albicans NOT DETECTED NOT DETECTED Final   Candida auris NOT DETECTED NOT DETECTED Final   Candida glabrata NOT DETECTED NOT DETECTED Final   Candida krusei NOT DETECTED NOT DETECTED Final   Candida parapsilosis NOT DETECTED NOT DETECTED Final   Candida tropicalis NOT DETECTED NOT DETECTED Final   Cryptococcus neoformans/gattii NOT DETECTED NOT DETECTED Final   Vancomycin resistance NOT DETECTED NOT DETECTED Final    Comment: Performed at [facility], [address] [place], [place] [address]  Urine Culture     Status: Abnormal   Collection Time: 08/01/23  5:02 PM   Specimen: Urine, Random  Result Value Ref Range Status   Specimen Description   Final    URINE, RANDOM Performed at [facility], [address], [place], [place] [address]    Special Requests   Final    NONE Reflexed from [id number] Performed at [facility], [address], [place], [place] [address]    Culture (A)  Final    <10,000 COLONIES/mL INSIGNIFICANT GROWTH Performed at [facility], [address], [place], [place] [address]    Report Status 08/03/2023 FINAL  Final  Culture, blood (routine x 2)     Status: None   Collection Time: 08/01/23  5:09 PM   Specimen: BLOOD  Result Value Ref Range Status    Specimen Description   Final    BLOOD BLOOD RIGHT HAND Performed at [facility], [address] [place], [place] [address]    Special Requests   Final    BOTTLES DRAWN AEROBIC ONLY Blood Culture results may not be optimal due to an inadequate volume of blood received in culture bottles Performed at [facility], [address] [place], [place] [address]    Culture   Final    NO GROWTH 5 DAYS Performed at [facility], [address], [place], [place] [address]    Report Status 08/06/2023 FINAL  Final  MRSA Next Gen by PCR, Nasal     Status: None   Collection Time: 08/01/23  9:15 PM   Specimen: Nasal Mucosa; Nasal Swab  Result Value Ref Range Status   MRSA by PCR Next Gen NOT DETECTED NOT DETECTED Final    Comment: (NOTE) The GeneXpert MRSA Assay (FDA approved for NASAL specimens only), is one component of a comprehensive MRSA colonization surveillance program. It is not intended to diagnose MRSA infection nor to guide or monitor treatment for MRSA infections. Test performance is not FDA approved in patients less than 2 years  old. Performed at [facility], [address] [place], [place] [address]   Culture, blood (Routine X 2) w Reflex to ID Panel     Status: None (Preliminary result)   Collection Time: 08/03/23  3:03 AM   Specimen: BLOOD  Result Value Ref Range Status   Specimen Description   Final    BLOOD BLOOD RIGHT ARM Performed at [facility], [address], [place], [place] [address]    Special Requests   Final    BOTTLES DRAWN AEROBIC AND ANAEROBIC Blood Culture adequate volume Performed at [facility], [address] [place], [place] [address]    Culture   Final    NO GROWTH 3 DAYS Performed at [facility], [address], [place], [place] [address]    Report Status PENDING  Incomplete  Culture, blood (Routine X 2) w Reflex to ID Panel     Status:  None (Preliminary result)   Collection Time: 08/03/23  5:49 AM   Specimen: BLOOD RIGHT ARM  Result Value Ref Range Status   Specimen Description   Final    BLOOD RIGHT ARM Performed at [facility], [address], [place], [place] [address]    Special Requests   Final    BOTTLES DRAWN AEROBIC AND ANAEROBIC Blood Culture results may not be optimal due to an inadequate volume of blood received in culture bottles Performed at [facility], [address], [place], [place] [address]    Culture   Final    NO GROWTH 3 DAYS Performed at [facility], [address] [place], [place] [address]    Report Status PENDING  Incomplete    Radiology Studies: [place] EKG SITE RITE Result Date: 08/06/2023 If Site Rite image not attached, placement could not be confirmed due to current cardiac rhythm.  DG CHEST PORT 1 VIEW Result Date: 08/06/2023 CLINICAL DATA:  Shortness of breath. EXAM: PORTABLE CHEST 1 VIEW COMPARISON:  July 31, 2013. FINDINGS: Stable cardiomediastinal silhouette. Status post coronary bypass graft. Decreased bilateral opacities are noted suggesting improving edema or pneumonia. Bony thorax is unremarkable. IMPRESSION: Decreased bibasilar opacities suggesting improving edema or pneumonia. Electronically Signed   By: Lupita Raider M.D.   On: 08/06/2023 08:32   [place] EKG SITE RITE Result Date: 08/05/2023 If Site Rite image not attached, placement could not be confirmed due to current cardiac rhythm.  Scheduled Meds:  amiodarone  200 mg Oral Daily   amitriptyline  50 mg Oral QHS   Chlorhexidine Gluconate Cloth  6 each Topical Daily   digoxin  0.125 mg Oral Daily   docusate sodium  100 mg Oral BID   levothyroxine  88 mcg Oral QAC breakfast   [START ON 08/07/2023] midodrine  10 mg Oral TID WC   mometasone-formoterol  2 puff Inhalation BID   mouth rinse  15 mL Mouth Rinse 4 times per day   polyethylene glycol  17 g Oral BID   senna-docusate  1 tablet  Oral BID   Continuous Infusions:  ampicillin (OMNIPEN) IV Stopped (08/06/23 2107)   calcium gluconate 1,000 mg (08/06/23 2141)   dextrose 5 % and 0.9 % NaCl with KCl 20 mEq/L 40 mL/hr at 08/06/23 1654   heparin 800 Units/hr (08/06/23 2057)    LOS: 5 days   This patient is critically ill with multiple organ system failure and requires high complexity decision making for assessment and support, frequent evaluation and  titration of therapies and application of advanced monitoring technologies and extensive interpretation of multiple databases.  CRITICAL CARE TIME: 34 nonconsecutive minutes devoted to patient care services described in this note including but not limited to reviewing the chart, seeing the patient, coordinating care, updating family if available and discussing with multiple consultants  Marguerita Merles, DO [facility] Available via Epic secure chat 7am-7pm After these hours, please refer to coverage provider listed on [email or web address] 08/06/2023, 9:41 PM

## 2023-08-06 NOTE — Progress Notes (Signed)
Provider notified that patient BP continues to be hypotensive (118/36) after PM dose of midodrine given and fluids started. Per Dr. Marland Mcalpine to hold all antihypertensives cardizem/torsemide and BP goal SBP >90 and if worsening mentation/rectal bleeding patient may need CT head/angiogram.

## 2023-08-06 NOTE — Evaluation (Signed)
Clinical/Bedside Swallow Evaluation Patient Details  Name: EVELYNE PICKER MRN: [id number] Date of Birth: 09/11/1945  Today's Date: 08/06/2023 Time: SLP Start Time (ACUTE ONLY): 1433 SLP Stop Time (ACUTE ONLY): 1450 SLP Time Calculation (min) (ACUTE ONLY): 17 min  Past Medical History:  Past Medical History:  Diagnosis Date   Acute exacerbation of CHF (congestive heart failure) (HCC) 07/12/2023   Arthritis    Breast cancer (HCC) dx'd 09/2015   COPD (chronic obstructive pulmonary disease) (HCC)    Coronary artery disease    Depression    Diabetes mellitus (HCC) 10/22/2015   Full dentures    full upper dentures, plate on bottom   History of bronchitis    History of pneumonia    Hyperlipidemia    Hypertension    Hypothyroidism 10/22/2015   lung ca dx;d 09/2015   lung cancer   Myocardial infarction [facility]) July 1995   Osteoporosis 10/22/2015   Personal history of radiation therapy    Stress incontinence    Uterine cancer (HCC) dx'd 1968   Past Surgical History:  Past Surgical History:  Procedure Laterality Date   ABDOMINAL HYSTERECTOMY     1968   APPENDECTOMY     with hysterectomy   BLADDER SURGERY     1972   BREAST BIOPSY Left 11/04/2015   Malignant   BREAST BIOPSY Left 11/04/2015   Malignant   BREAST LUMPECTOMY Left 11/19/2015   CARDIAC CATHETERIZATION  1995, 1996   Dr. 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   LUNG BIOPSY N/A 11/13/2015   Procedure: RIGHT LUNG BIOPSY;  Surgeon: Delight Ovens, MD;  Location: [facility] OR;  Service: Thoracic;  Laterality: N/A;    MULTIPLE TOOTH EXTRACTIONS     RADIOACTIVE SEED GUIDED PARTIAL MASTECTOMY WITH AXILLARY SENTINEL LYMPH NODE BIOPSY Left 11/19/2015   Procedure: RADIOACTIVE SEED GUIDED PARTIAL MASTECTOMY WITH AXILLARY SENTINEL LYMPH NODE BIOPSY;  Surgeon: Manus Rudd, MD;  Location: [facility];  Service: General;  Laterality: Left;   RE-EXCISION OF BREAST LUMPECTOMY Left 12/03/2015   Procedure: RE-EXCISION INFERIOR MARGIN OF LEFT BREAST LUMPECTOMY;  Surgeon: Manus Rudd, MD;  Location: [facility] OR;  Service: General;  Laterality: Left;   TMJ ARTHROPLASTY     1979   TONSILLECTOMY     TRANSESOPHAGEAL ECHOCARDIOGRAM (CATH LAB) N/A 07/16/2023   Procedure: TRANSESOPHAGEAL ECHOCARDIOGRAM;  Surgeon: Sande Rives, MD;  Location: [facility] INVASIVE CV LAB;  Service: Cardiovascular;  Laterality: N/A;   VIDEO BRONCHOSCOPY WITH ENDOBRONCHIAL NAVIGATION N/A 11/13/2015   Procedure: VIDEO BRONCHOSCOPY WITH ENDOBRONCHIAL NAVIGATION, with placement of fudicial markers;  Surgeon: Delight Ovens, MD;  Location: [facility] OR;  Service: Thoracic;  Laterality: N/A;   HPI:  Patient is a 77 y.o. female who was assessed via BSE and MBS this admission on 12/17 secondary to patient c/o difficult with swallowing pills and RN observing patient to have instances of coughing when drinking liquids. MBS showed no penetration or aspiration, no residue. Swallow initiation at pyriform sinsues. Barium tablet lodged in vallecuale and thoracis  esopahgus, passed with puree.  SLP signed off. Reordered on 12/20 due to RN observing consistent and significant coughing with liquids with concern for aspiration though most recent image reports "Decreased bibasilar opacities suggesting improving edema or  pneumonia".  Pt previously admitted 11/25-12/6/24 with anasarca, CHF exacerbation, a-fib with RVR. She was discharged to [facility]. She presented to hospital on 12/15 with SOB, wheezing and altered.  CT chest suggestive of PNA. She was placed on  BiPAP. PMH: CAD, h/o CABG 1996, COPD, HTN, HLD, hypothyroidism, DM-2, chronic respiratory failure on 3L, h/o breast and lung cancer.    Assessment / Plan / Recommendation  Clinical Impression  Pt visited while up in chair after PT session. When asked about difficulty swallowing pt reiterated occasional difficulty swallowing pills. She says if she feels them sticking, she washes them down. SLP confirmed that in her MBS her pill briefly lodged twice and suggested liquid wash, or ice cream or puree. She doesnt like applesauce anymore. When observed taking sips, pt self fed via straw, small sips. Pt did not cough. She also fed herself a graham cracker and follwed each bite with a sip, again no coughing. Asked pt about any previous coughing with RN. She said that yes she was coughing but it was because she was trying to expectorate phlegm. She also reported that RN instructed her to tuck her chin and she liked doing that. Observed her with this without any change in function. A chin tuck really should only be done as a recommendation following instrumental assessment, but if pt likes tucking her chin it doesnt appear detrimental in this case. More important is upright posture, small sips and good oral hygiene. Given no new sign of impairment, recent instrumental assessment with consistent presentation today and improving CXR will continue current diet and f/u once during the week. SLP Visit Diagnosis: Dysphagia, oropharyngeal phase (R13.12)    Aspiration Risk  Mild aspiration risk    Diet Recommendation Regular;Thin liquid    Liquid Administration via: Cup;Straw Medication Administration: Whole meds with liquid Supervision: Patient able to self feed Compensations: Slow rate;Small sips/bites Postural Changes: Seated upright at 90 degrees    Other  Recommendations Oral Care Recommendations: Oral care BID    Recommendations for follow up therapy are one component of a multi-disciplinary discharge  planning process, led by the attending physician.  Recommendations may be updated based on patient status, additional functional criteria and insurance authorization.  Follow up Recommendations        Assistance Recommended at Discharge    Functional Status Assessment    Frequency and Duration min 2x/week  1 week       Prognosis Prognosis for improved oropharyngeal function: Good      Swallow Study   General HPI: Patient is a 77 y.o. female who was assessed via BSE and MBS this admission on 12/17 secondary to patient c/o difficult with swallowing pills and RN observing patient to have instances of coughing when drinking liquids. MBS showed no penetration or aspiration, no residue. Swallow initiation at pyriform sinsues. Barium tablet lodged in vallecuale and thoracis esopahgus, passed with puree.  SLP signed off. Reordered on 12/20 due to RN observing consistent and significant coughing with liquids with concern for aspiration though most recent image reports "Decreased bibasilar opacities suggesting improving edema or  pneumonia".  Pt previously admitted 11/25-12/6/24 with anasarca, CHF exacerbation, a-fib with RVR. She was discharged to [facility]. She presented to hospital on 12/15 with SOB, wheezing and altered.  CT chest suggestive of PNA. She was placed on BiPAP. PMH: CAD, h/o CABG 1996, COPD, HTN, HLD, hypothyroidism, DM-2, chronic respiratory failure on 3L, h/o breast and lung cancer. Type of Study: Bedside Swallow Evaluation Previous Swallow Assessment: see history Diet Prior to this Study: Regular;Thin liquids (Level 0) Temperature Spikes Noted: No Respiratory Status: Nasal cannula History of Recent Intubation: No Behavior/Cognition: Alert;Cooperative;Pleasant mood Oral Cavity Assessment: Within Functional Limits Oral Care Completed by SLP: No Oral Cavity - Dentition: Adequate natural dentition Vision: Functional for self-feeding Self-Feeding Abilities: Able to feed  self Patient Positioning: Upright in chair Baseline Vocal Quality: Normal Volitional Cough: Congested Volitional Swallow: Able to elicit    Oral/Motor/Sensory Function Overall Oral Motor/Sensory Function: Within functional limits   Ice Chips Ice chips: Not tested   Thin Liquid Thin Liquid: Within functional limits Presentation: Straw;Self Fed    Nectar Thick Nectar Thick Liquid: Not tested   Honey Thick Honey Thick Liquid: Not tested   Puree Puree: Not tested   Solid     Solid: Within functional limits Presentation: Self Fed      Jamoni Hewes, Riley Nearing 08/06/2023,2:57 PM

## 2023-08-06 NOTE — Plan of Care (Signed)
  Problem: Nutritional: Goal: Maintenance of adequate nutrition will improve Outcome: Progressing   Problem: Skin Integrity: Goal: Risk for impaired skin integrity will decrease Outcome: Progressing   Problem: Tissue Perfusion: Goal: Adequacy of tissue perfusion will improve Outcome: Progressing   Problem: Clinical Measurements: Goal: Ability to maintain clinical measurements within normal limits will improve Outcome: Progressing Goal: Respiratory complications will improve Outcome: Progressing Goal: Cardiovascular complication will be avoided Outcome: Progressing   Problem: Coping: Goal: Level of anxiety will decrease Outcome: Progressing   Problem: Pain Management: Goal: General experience of comfort will improve Outcome: Progressing   Problem: Elimination: Goal: Will not experience complications related to bowel motility Outcome: Not Progressing Goal: Will not experience complications related to urinary retention Outcome: Not Progressing

## 2023-08-06 NOTE — Progress Notes (Signed)
Patient Name: Denise Mckenzie Date of Encounter: 08/06/2023 [facility] Cardiologist: Armanda Magic, MD   Interval Summary  .    No cardiac complaints  Vital Signs .    Vitals:   08/06/23 0824 08/06/23 0900 08/06/23 0915 08/06/23 1000  BP:  (!) 104/59  (!) 83/52  Pulse: (!) 109 60 84 79  Resp: (!) 28 20 (!) 29 17  Temp:      TempSrc:      SpO2: 94% 98% 95% 97%  Weight:      Height:        Intake/Output Summary (Last 24 hours) at 08/06/2023 1131 Last data filed at 08/06/2023 0853 Gross per 24 hour  Intake 1775.33 ml  Output 1450 ml  Net 325.33 ml      08/02/2023    6:39 PM 07/30/2023   10:48 PM 07/25/2023   10:42 PM  Last 3 Weights  Weight (lbs) 153 lb 3.5 oz 153 lb 3.5 oz 153 lb  Weight (kg) 69.5 kg 69.5 kg 69.4 kg      Telemetry/ECG    Atrial flutter.  Rates less than 100 bpm.- Personally Reviewed  Physical Exam .    VS:  BP (!) 83/52   Pulse 79   Temp 98.2 F (36.8 C) (Oral)   Resp 17   Ht 5\' 2"  (1.575 m)   Wt 69.5 kg   SpO2 97%   BMI 28.02 kg/m  , BMI Body mass index is 28.02 kg/m. GENERAL:  Well appearing HEENT: Pupils equal round and reactive, fundi not visualized, oral mucosa unremarkable NECK:  No jugular venous distention, waveform within normal limits, carotid upstroke brisk and symmetric, no bruits, no thyromegaly LUNGS:  Clear to auscultation bilaterally HEART: Irregularly irregular PMI not displaced or sustained,S1 and S2 within normal limits, no S3, no S4, no clicks, no rubs, no murmurs ABD:  Flat, positive bowel sounds normal in frequency in pitch, no bruits, no rebound, no guarding, no midline pulsatile mass, no hepatomegaly, no splenomegaly EXT:  2 plus pulses throughout, no edema, no cyanosis no clubbing SKIN:  No rashes no nodules NEURO:  Cranial nerves II through XII grossly intact, motor grossly intact throughout PSYCH:  Cognitively intact, oriented to person place and time   Assessment & Plan .     [name] is a 46F  with  CAD s/p CABG, COPD, persistent atrial fibrillation/flutter, pulmonary hypertension, hypertension, hyperlipidemia, breast/lung cancer, diabetes admitted with shortness of breath and HCAP.  Cardiology consulted for atrial flutter per family request.    # Acute on chronic HFpEF: # Pulm hypertension group 2/3: This is improved.  She notes that her breathing is improving as well.  She is feeling stronger today.  She notes that the BiPAP is very helpful.  Planning for outpatient sleep study.  Continue torsemide and midodrine.    # Persistent atrial flutter:  Patient underwent TEE/DCCV 06/2023 but quickly went back into atrial flutter.  She remains in flutter at this time.  Now on amiodarone with plan for repeat DCCV once amiodarone is loaded.  Rate is well-controlled.  She has received well over 5 g load.  Continue 200 mg daily.  She had hand tremors at higher doses.  Continue digoxin, diltiazem, and Eliquis.  Would attempt DCCV again once more clinically stable.   Cardiology was asked to see the patient again today due to labile heart rates.  There are multiple heart rates recorded in the 140s.  However, if you pull up  the telemetry, it is repeatedly counting flutter waves.  Her ventricular rate is consistently less than 100 beats per minute.   # HCAP: # E faecalis bacteremia: Management per ID/primary team.  No plans for TEE.  [facility] will sign off.   Medication Recommendations: No changes Other recommendations (labs, testing, etc): Outpatient DCCV once stable Follow up as an outpatient:  scheduled 09/01/22  For questions or updates, please contact  [facility] Please consult [email or web address] for contact info under        Signed, Chilton Si, MD

## 2023-08-06 NOTE — Progress Notes (Signed)
Peripherally Inserted Central Catheter Placement  The IV Nurse has discussed with the patient and/or persons authorized to consent for the patient, the purpose of this procedure and the potential benefits and risks involved with this procedure.  The benefits include less needle sticks, lab draws from the catheter, and the patient may be discharged home with the catheter. Risks include, but not limited to, infection, bleeding, blood clot (thrombus formation), and puncture of an artery; nerve damage and irregular heartbeat and possibility to perform a PICC exchange if needed/ordered by physician.  Alternatives to this procedure were also discussed.  Bard Power PICC patient education guide, fact sheet on infection prevention and patient information card has been provided to patient /or left at bedside.    PICC Placement Documentation  PICC Triple Lumen 08/06/23 Left Cephalic 35 cm 0 cm (Active)  Indication for Insertion or Continuance of Line Poor Vasculature-patient has had multiple peripheral attempts or PIVs lasting less than 24 hours 08/06/23 2200  Exposed Catheter (cm) 0 cm 08/06/23 2200  Site Assessment Clean, Dry, Intact 08/06/23 2200  Lumen #1 Status Blood return noted;Flushed;Saline locked 08/06/23 2200  Lumen #2 Status Blood return noted;Flushed;Saline locked 08/06/23 2200  Lumen #3 Status Blood return noted;Flushed;Saline locked 08/06/23 2200  Dressing Type Transparent;Securing device 08/06/23 2200  Dressing Status Clean, Dry, Intact;Antimicrobial disc in place 08/06/23 2200  Line Care Connections checked and tightened 08/06/23 2200  Line Adjustment (NICU/IV Team Only) No 08/06/23 2200  Dressing Intervention New dressing;Adhesive placed at insertion site (IV team only) 08/06/23 2200  Dressing Change Due 08/13/23 08/06/23 2200       Christeen Douglas 08/06/2023, 10:59 PM

## 2023-08-07 ENCOUNTER — Inpatient Hospital Stay (HOSPITAL_COMMUNITY): Payer: Medicare Other

## 2023-08-07 DIAGNOSIS — J410 Simple chronic bronchitis: Secondary | ICD-10-CM | POA: Diagnosis not present

## 2023-08-07 DIAGNOSIS — D62 Acute posthemorrhagic anemia: Secondary | ICD-10-CM

## 2023-08-07 DIAGNOSIS — J9622 Acute and chronic respiratory failure with hypercapnia: Secondary | ICD-10-CM | POA: Diagnosis not present

## 2023-08-07 DIAGNOSIS — I5032 Chronic diastolic (congestive) heart failure: Secondary | ICD-10-CM | POA: Diagnosis not present

## 2023-08-07 DIAGNOSIS — R7881 Bacteremia: Secondary | ICD-10-CM | POA: Diagnosis not present

## 2023-08-07 DIAGNOSIS — I483 Typical atrial flutter: Secondary | ICD-10-CM

## 2023-08-07 LAB — CBC
HCT: 32.9 % — ABNORMAL LOW (ref 36.0–46.0)
Hemoglobin: 10.5 g/dL — ABNORMAL LOW (ref 12.0–15.0)
MCH: 29.9 pg (ref 26.0–34.0)
MCHC: 31.9 g/dL (ref 30.0–36.0)
MCV: 93.7 fL (ref 80.0–100.0)
Platelets: 114 K/uL — ABNORMAL LOW (ref 150–400)
RBC: 3.51 MIL/uL — ABNORMAL LOW (ref 3.87–5.11)
RDW: 16.4 % — ABNORMAL HIGH (ref 11.5–15.5)
WBC: 21.2 K/uL — ABNORMAL HIGH (ref 4.0–10.5)
nRBC: 0 % (ref 0.0–0.2)

## 2023-08-07 LAB — CBC WITH DIFFERENTIAL/PLATELET
Abs Immature Granulocytes: 0.27 10*3/uL — ABNORMAL HIGH (ref 0.00–0.07)
Basophils Absolute: 0 10*3/uL (ref 0.0–0.1)
Basophils Relative: 0 %
Eosinophils Absolute: 0.1 10*3/uL (ref 0.0–0.5)
Eosinophils Relative: 0 %
HCT: 33.1 % — ABNORMAL LOW (ref 36.0–46.0)
Hemoglobin: 10.1 g/dL — ABNORMAL LOW (ref 12.0–15.0)
Immature Granulocytes: 1 %
Lymphocytes Relative: 2 %
Lymphs Abs: 0.4 10*3/uL — ABNORMAL LOW (ref 0.7–4.0)
MCH: 29 pg (ref 26.0–34.0)
MCHC: 30.5 g/dL (ref 30.0–36.0)
MCV: 95.1 fL (ref 80.0–100.0)
Monocytes Absolute: 0.9 10*3/uL (ref 0.1–1.0)
Monocytes Relative: 5 %
Neutro Abs: 17.2 10*3/uL — ABNORMAL HIGH (ref 1.7–7.7)
Neutrophils Relative %: 92 %
Platelets: 109 10*3/uL — ABNORMAL LOW (ref 150–400)
RBC: 3.48 MIL/uL — ABNORMAL LOW (ref 3.87–5.11)
RDW: 16.4 % — ABNORMAL HIGH (ref 11.5–15.5)
WBC: 18.8 10*3/uL — ABNORMAL HIGH (ref 4.0–10.5)
nRBC: 0 % (ref 0.0–0.2)

## 2023-08-07 LAB — COMPREHENSIVE METABOLIC PANEL
ALT: 31 U/L (ref 0–44)
AST: 21 U/L (ref 15–41)
Albumin: 2.2 g/dL — ABNORMAL LOW (ref 3.5–5.0)
Alkaline Phosphatase: 51 U/L (ref 38–126)
Anion gap: 6 (ref 5–15)
BUN: 28 mg/dL — ABNORMAL HIGH (ref 8–23)
CO2: 39 mmol/L — ABNORMAL HIGH (ref 22–32)
Calcium: 7.7 mg/dL — ABNORMAL LOW (ref 8.9–10.3)
Chloride: 95 mmol/L — ABNORMAL LOW (ref 98–111)
Creatinine, Ser: 0.65 mg/dL (ref 0.44–1.00)
GFR, Estimated: >60 mL/min (ref >60)
Glucose, Bld: 132 mg/dL — ABNORMAL HIGH (ref 70–99)
Potassium: 4.4 mmol/L (ref 3.5–5.1)
Sodium: 140 mmol/L (ref 135–145)
Total Bilirubin: 0.7 mg/dL (ref <1.2)
Total Protein: 3.9 g/dL — ABNORMAL LOW (ref 6.5–8.1)

## 2023-08-07 LAB — GLUCOSE, CAPILLARY
Glucose-Capillary: 101 mg/dL — ABNORMAL HIGH (ref 70–99)
Glucose-Capillary: 108 mg/dL — ABNORMAL HIGH (ref 70–99)
Glucose-Capillary: 120 mg/dL — ABNORMAL HIGH (ref 70–99)
Glucose-Capillary: 139 mg/dL — ABNORMAL HIGH (ref 70–99)
Glucose-Capillary: 147 mg/dL — ABNORMAL HIGH (ref 70–99)
Glucose-Capillary: 83 mg/dL (ref 70–99)
Glucose-Capillary: 95 mg/dL (ref 70–99)

## 2023-08-07 LAB — HEMOGLOBIN AND HEMATOCRIT, BLOOD
HCT: 31.4 % — ABNORMAL LOW (ref 36.0–46.0)
HCT: 30.2 % — ABNORMAL LOW (ref 36.0–46.0)
HCT: 28.9 % — ABNORMAL LOW (ref 36.0–46.0)
Hemoglobin: 9.9 g/dL — ABNORMAL LOW (ref 12.0–15.0)
Hemoglobin: 9.5 g/dL — ABNORMAL LOW (ref 12.0–15.0)
Hemoglobin: 9.3 g/dL — ABNORMAL LOW (ref 12.0–15.0)

## 2023-08-07 LAB — HEPARIN LEVEL (UNFRACTIONATED): Heparin Unfractionated: >1.1 [IU]/mL — ABNORMAL HIGH (ref 0.30–0.70)

## 2023-08-07 LAB — PHOSPHORUS: Phosphorus: 2.6 mg/dL (ref 2.5–4.6)

## 2023-08-07 LAB — MAGNESIUM: Magnesium: 2.1 mg/dL (ref 1.7–2.4)

## 2023-08-07 LAB — APTT: aPTT: 140 s — ABNORMAL HIGH (ref 24–36)

## 2023-08-07 MED ORDER — ADULT MULTIVITAMIN W/MINERALS CH
1.0000 | ORAL_TABLET | Freq: Every day | ORAL | Status: DC
  Administered 2023-08-07 – 2023-08-11 (×5): 1 via ORAL
  Filled 2023-08-07 (×6): qty 1

## 2023-08-07 MED ORDER — PANTOPRAZOLE SODIUM 40 MG IV SOLR
40.0000 mg | Freq: Two times a day (BID) | INTRAVENOUS | Status: DC
Start: 2023-08-07 — End: 2023-08-14
  Administered 2023-08-07 – 2023-08-13 (×13): 40 mg via INTRAVENOUS
  Filled 2023-08-07 (×14): qty 10

## 2023-08-07 MED ORDER — ORAL CARE MOUTH RINSE: 15.0000 mL | OROMUCOSAL | Status: DC | PRN

## 2023-08-07 MED ORDER — SODIUM CHLORIDE 0.9 % IV BOLUS
500.0000 mL | Freq: Once | INTRAVENOUS | Status: AC
  Administered 2023-08-07: 500 mL via INTRAVENOUS

## 2023-08-07 MED ORDER — TRAMADOL HCL 50 MG PO TABS
50.0000 mg | ORAL_TABLET | Freq: Four times a day (QID) | ORAL | Status: DC | PRN
  Administered 2023-08-07 – 2023-08-10 (×4): 50 mg via ORAL
  Filled 2023-08-07 (×4): qty 1

## 2023-08-07 MED ORDER — HEPARIN (PORCINE) 25000 UT/250ML-% IV SOLN
650.0000 [IU]/h | INTRAVENOUS | Status: DC
  Administered 2023-08-07: 650 [IU]/h via INTRAVENOUS

## 2023-08-07 MED ORDER — HYDROCORTISONE ACETATE 25 MG RE SUPP
25.0000 mg | Freq: Two times a day (BID) | RECTAL | Status: DC
Start: 2023-08-07 — End: 2023-08-12
  Administered 2023-08-07 – 2023-08-10 (×7): 25 mg via RECTAL
  Filled 2023-08-07 (×11): qty 1

## 2023-08-07 MED ORDER — GERHARDT'S BUTT CREAM
TOPICAL_CREAM | Freq: Every day | CUTANEOUS | Status: DC
  Filled 2023-08-07 (×2): qty 60

## 2023-08-07 NOTE — Progress Notes (Signed)
       Overnight   NAME: Denise Mckenzie MRN: [id number] DOB : 06-15-1946    Date of Service   08/07/2023   HPI/Events of Note    Notified by Attending to follow up on BP, Hypotension ongoing.  Patient awake and responding slowly. Bedside visit: Changed cuff size and location of cuff.  BP results after these measures are :  2130 hrs 111/47 (66) HR 90  -And-  2200 Hrs 109/56 (75) HR 87    Interventions/ Plan   Continue all as previously ordered Maintain cuff size/location      Update: 0630 hrs BP 110/33 HR 78    Chinita Greenland BSN MSNA MSN ACNPC-AG Acute Care Nurse Practitioner [facility] [place]

## 2023-08-07 NOTE — Plan of Care (Signed)
  Problem: Education: Goal: Ability to describe self-care measures that may prevent or decrease complications (Diabetes Survival Skills Education) will improve Outcome: Progressing Goal: Individualized Educational Video(s) Outcome: Progressing   Problem: Coping: Goal: Ability to adjust to condition or change in health will improve Outcome: Progressing   Problem: Health Behavior/Discharge Planning: Goal: Ability to identify and utilize available resources and services will improve Outcome: Progressing Goal: Ability to manage health-related needs will improve Outcome: Progressing   Problem: Metabolic: Goal: Ability to maintain appropriate glucose levels will improve Outcome: Progressing   Problem: Tissue Perfusion: Goal: Adequacy of tissue perfusion will improve Outcome: Progressing   Problem: Education: Goal: Knowledge of General Education information will improve Description: Including pain rating scale, medication(s)/side effects and non-pharmacologic comfort measures Outcome: Progressing   Problem: Health Behavior/Discharge Planning: Goal: Ability to manage health-related needs will improve Outcome: Progressing   Problem: Clinical Measurements: Goal: Ability to maintain clinical measurements within normal limits will improve Outcome: Progressing Goal: Will remain free from infection Outcome: Progressing Goal: Diagnostic test results will improve Outcome: Progressing Goal: Respiratory complications will improve Outcome: Progressing Goal: Cardiovascular complication will be avoided Outcome: Progressing   Problem: Activity: Goal: Risk for activity intolerance will decrease Outcome: Progressing   Problem: Nutrition: Goal: Adequate nutrition will be maintained Outcome: Progressing   Problem: Coping: Goal: Level of anxiety will decrease Outcome: Progressing   Problem: Elimination: Goal: Will not experience complications related to bowel motility Outcome:  Progressing   Problem: Pain Management: Goal: General experience of comfort will improve Outcome: Progressing   Problem: Safety: Goal: Ability to remain free from injury will improve Outcome: Progressing   Problem: Skin Integrity: Goal: Risk for impaired skin integrity will decrease Outcome: Progressing

## 2023-08-07 NOTE — Progress Notes (Signed)
Initial Nutrition Assessment  DOCUMENTATION CODES:   Not applicable  INTERVENTION:  - Liberalize to Carb Modified diet with fluid restriction to promote intake. - Magic cup BID with meals, each supplement provides 290 kcal and 9 grams of protein - Encourage intake at all meals and of supplements.  - Add Multivitamin with minerals daily - Monitor weigh trends.   NUTRITION DIAGNOSIS:   Inadequate oral intake related to decreased appetite, other (see comment) (dislike of available food) as evidenced by energy intake < or equal to 50% for > or equal to 5 days.  GOAL:   Patient will meet greater than or equal to 90% of their needs  MONITOR:   PO intake, Supplement acceptance, Weight trends   REASON FOR ASSESSMENT:   Consult Assessment of nutrition requirement/status, Poor PO  ASSESSMENT:   77 y.o. female with PMH of CAD, COPD, HTN, HLD, hypothyroidism, T2DM, chronic respiratory failure,  h/o breast and lung cancer.  Patient recently admitted 11/25 - 12/6 with anasarca, CHF exacerbation, A-fib with RVR.  Patient brought back in 12/15 after she became short of breath, wheezing and altered. Admitted for acute on chronic respiratory failure.  RD working remotely. Called patient via bedside telephone to obtain nutrition history.   Patient reports a UBW of 127# "before everything started" and that she has had weight gain from fluid since around August. Per EMR, patient weighed at 129# in June. The next weight was then in November when patient was weighed at 144#. She is noted to have been admitted 11/25-12/6 with CHF exacerbation and was treated with lasix during which she went from 147# at the beginning of admission to 153# by 12/8. Weights since that time appear to be copied over.   She reports not eating well for some time due to pain with swallowing and "food getting stuck around dentures". Patient is being followed by SLP this admission. Had a MBS 12/17 and was cleared for a  regular textures diet and thin liquids by SLP.   She has been on a heart healthy/carb modified diet. Patient consuming 10-50% of meals. She notes she doesn't like the food very much and has been skipping meals because of this.  Discussed liberalizing diet some to allow more food choices which patient was appreciative of. Discussed with Dr. Marland Mcalpine who was agreeable to plan.  She does not like Ensure but agreeable to try Magic Cup with meals. Encouraged patient to always order a tray and try and eat something to maintain adequate nutrition during admission.  She reports lose bowels but had been having constipation prior and is now on antibiotics.   Medications reviewed and include: Colace, Miralax, Senokot  Labs reviewed:  HA1C 7.1 Blood Glucose 110-148 x24 hours   NUTRITION - FOCUSED PHYSICAL EXAM:  RD working remotely  Diet Order:   Diet Order             Diet heart healthy/carb modified Room service appropriate? Yes with Assist; Fluid consistency: Thin; Fluid restriction: 1500 mL Fluid  Diet effective now                   EDUCATION NEEDS:  Education needs have been addressed  Skin:  Skin Assessment: Skin Integrity Issues: Skin Integrity Issues:: DTI DTI: Left sacrum  Last BM:  12/20 - type 7  Height:  Ht Readings from Last 1 Encounters:  08/02/23 5\' 2"  (1.575 m)   Weight:  Wt Readings from Last 1 Encounters:  08/02/23 69.5 kg  Ideal Body Weight:  50 kg  BMI:  Body mass index is 28.02 kg/m.  Estimated Nutritional Needs:  Kcal:  1650-1850 kcals Protein:  70-90 grams Fluid:  >/= 1.7L    Shelle Iron RD, LDN Contact via Secure Chat.

## 2023-08-07 NOTE — Progress Notes (Signed)
PROGRESS NOTE    Denise Mckenzie  NGE:[id number] DOB: 1945/09/04 DOA: 08/01/2023 PCP: Burton Apley, MD   Brief Narrative:  The patient is a 77 year old female with past medical history of CAD ( h/o CABG 36), COPD, HTN, HLD, hypothyroidism, T2DM, chronic respiratory failure 3L,  h/o breast and lung cancer.  Patient recently admitted 11/25 - 12/6 with anasarca, CHF exacerbation, A-fib with RVR.  Patient treated with diuresis.  She underwent TEE/DCCV for A-fib.  Patient discharged to [facility].  Patient brought back in after she became short of breath, wheezing and altered.  Initially noted to be in hypercapnic respiratory failure requiring BiPAP.  CT suggestive of possible pneumonia and started on vancomycin and cefepime.  Eventually blood cultures growing GPC/Enterococcus faecalis therefore transition to Unasyn per ID recommendations.  Cardiology following the patient as well.  Echocardiogram showed preserved EF without vegetation.  08/05/2023 cardiology has now signed off the case and recommending anticoagulation and continue amiodarone for now.  She is now retaining some urine still we will do an In-N-Out x 3 and then if she fails another time we will place Foley catheter.  Infectious diseases recommending ampicillin IV every 24 hours with an end date of 08/16/2023 and has made recommendations for OPAT.  She continues to have some hypoglycemia so we will hold her insulin regimen.  Unna boots have been applied now  08/06/2023.  Patient is more hypotensive so given low-dose IV fluids and increase midodrine as well as give a dose of albumin.  Antihypertensives including Cardizem and torsemide have been held.  FOBT is positive now after she had a large bowel movement and will add suppositories and discussed with GI who feels no role for colonoscopy.  Given her low blood pressure discussed with pulmonary who also feel that there is no role for pressors at this point.  Will need to continue monitor  her blood count carefully  08/07/2023.  Continues to be hypotensive today and had more bloody stools and GI bleeding.  Continue to hold her antihypertensives and will give her a bolus of 500 mL.  Blood count continues to drop and now heparin drip will be discontinued and she will be initiated on Anusol suppositories.  There is a low threshold to start pressors given continued hypotension  Assessment & Plan:  Principal Problem:   Acute on chronic respiratory failure with hypercapnia (HCC) Active Problems:   Coronary artery disease   Hypertension   Hypothyroidism   Primary cancer of right upper lobe of lung (HCC)   COPD (chronic obstructive pulmonary disease) (HCC)   Chronic diastolic (congestive) heart failure (HCC)   PAF (paroxysmal atrial fibrillation) (HCC)   HCAP (healthcare-associated pneumonia)   Hypotension   Enterococcus faecalis infection   Bacteremia   Typical atrial flutter (HCC)   Acute on chronic hypercapnic respiratory failure Healthcare acquired pneumonia Enterococcus faecalis bacteremia History of COPD -Weaned off BiPAP.  MRSA swab negative.  Blood cultures growing GPC/Enterococcus faecalis.  Antibiotics per ID.  Echo ef 65%, enlarged RV.  Seen by speech and swallow, recommending regular diet with aspiration precautions. -Placed on empiric prednisone daily for 5 days which is likely caused her leukocytosis to remain elevated; WBC Trend: Recent Labs  Lab 08/02/23 0257 08/03/23 0303 08/04/23 0259 08/05/23 0300 08/06/23 0250 08/06/23 2358 08/07/23 0501  WBC 19.6* 19.2* 18.0* 21.1* 22.0* 21.2* 18.8*  -ID was following recommending outpatient antibiotics with OPAT orders for ampicillin every 24 hours -COVID, flu, RSV negative, urine strep antigen-negative -Continue with [facility]  2 puffs IH twice daily as well as Xopenex 0.63 mg neb every 6 as needed for wheezing and shortness of breath -Given her hypotension will need to monitor carefully -PT OT recommending SNF when  she is medically stable  Hypotension, persistent -In setting of infection; will hold her antihypertensives with Cardizem and torsemide -Give gentle IV fluid hydration and give a dose of albumin; dose of albumin given yesterday 25 mcg. -Increase Midodrine from 5 mg 3 times daily to 10 mg 3 times daily -Blood pressure goal with a systolic blood pressure greater than 90 however she is now having blood pressures less than 90 so we will give her a 500 mL bolus -Monitor for further signs and symptoms bleeding -Discussed with pulmonary yesterday who felt that pressors do not need to be initiated at this point and recommending close monitoring however she may decompensate and pressors may need to be initiated specifically with her GI bleeding now.  Diabetes mellitus type 2 -Sliding scale and Accu-Cheks -CBG Trend: Recent Labs  Lab 08/06/23 1618 08/06/23 1932 08/07/23 0017 08/07/23 0352 08/07/23 0753 08/07/23 1154 08/07/23 1531  GLUCAP 111* 148* 147* 139* 120* 101* 108*   History of coronary artery disease with history of CABG in 1996 CHF with preserved EF with severe RV failure/dilation -Echo shows EF 65%, elevated PASP, severe RV failure.  Signs of third spacing but no obvious intravascular volume overload.  Continue home torsemide 40 mg orally daily and Unna boots -BNP Trend:  Recent Labs  Lab 07/12/23 1434 07/13/23 0038 07/25/23 2240 07/30/23 2126 08/01/23 1650 08/03/23 0303  BNP 2,311.4* 2,289.1* 1,292.5* 1,287.7* 862.2* 528.7*  -Home meds Eliquis, statin, Cardizem, Marcelline Deist but will hold her Cardizem for now and Farxiga -Eliquis is being held and will be changed to heparin drip which was done yesterday but is now will be held again given her GI bleeding  FOBT positive and bright red blood per rectum -Had a drop in her hemoglobin and had red blood smeared in her stool after she was constipated for quite a while yesterday and now has had more maroon and bloody stools which are now  bright red -Hgb/Hct Trend: Recent Labs  Lab 08/05/23 0300 08/06/23 0250 08/06/23 1733 08/06/23 2358 08/07/23 0501 08/07/23 1010 08/07/23 1632  HGB 14.1 13.0 10.9* 10.5* 10.1* 9.9* 9.5*  HCT 45.3 43.1 34.8* 32.9* 33.1* 31.4* 30.2*  MCV 94.2 95.6  --  93.7 95.1  --   --   -Check anemia panel in the a.m. -Continue to monitor for signs and symptoms bleeding; has some GI bleeding noted and bright red blood per rectum -Continue to monitor H&H's Q6 -Discussed with GI Dr. Dulce Sellar who feels that there is no role for colonoscopy given how ill the patient is at this time and recommended that she diffuse active GI bleeding they are recommending a stat CT angio of the abdomen pelvis -Will hold her anticoagulation now  Paroxysmal atrial fibrillation status post cardioversion 07/16/2023 -Currently on amiodarone, digoxin, Cardizem (HOLD).  Eliquis was continued but will be changing to heparin drip: Heparin drip has now been held given her GI bleeding -seen by cardiology they have signed off and scheduled for follow-up appointment outpatient setting -Reconsulted cardiology given her tachycardic rates and they felt that telemetry was repeatedly currently on flutter waves and with her ventricular rates consistently less than 100 bpm so now signed off the case again -Unfortunately given her hypotension we had to hold her Cardizem and torsemide  History of right upper lobe  lung cancer History of breast cancer -Both in remission  Acute Urinary Retention -Has been wheelchair-bound basically since a few months ago -Do In and Out cath x 3 and if necessary will place Foley catheter and start her on tamsulosin if able -Foley had to be placed overnight  Hyperlipidemia -Holding Statin given slightly abnormal LFTs  Constipation -Bowel regimen initiated  Hypothyroidism -Continue with Levothyroxine 88 mcg po Daily  Abnormal LFTs, improved  -LFT Trend: Recent Labs  Lab 07/12/23 1500 08/01/23 1650  08/06/23 0250 08/06/23 1955 08/07/23 0501  AST 38 47* 23 19 21   ALT 33 52* 34 25 31  -Continue to Monitor and Trend and repeat CMP in the AM  Thrombocytopenia, slowly dropping  -Platelet Count Trend: Recent Labs  Lab 08/02/23 0257 08/03/23 0303 08/04/23 0259 08/05/23 0300 08/06/23 0250 08/06/23 2358 08/07/23 0501  PLT 157 141* 128* 117* 108* 114* 109*  -Continue to Monitor and Trend and repeat CBC in the AM  Hypoalbuminemia -Patient's Albumin Trend: Recent Labs  Lab 07/12/23 1500 08/01/23 1650 08/06/23 0250 08/06/23 1955 08/07/23 0501  ALBUMIN 3.9 2.5* 2.0* 1.8* 2.2*  -Continue to Monitor and Trend and repeat CMP in the AM  Overweight  -Complicates overall prognosis and care -Estimated body mass index is 28.02 kg/m as calculated from the following:   Height as of this encounter: 5\' 2"  (1.575 m).   Weight as of this encounter: 69.5 kg.  -Weight Loss and Dietary Counseling given   DVT prophylaxis: Discontinued heparin drip given her GI bleeding    Code Status: Full Code Family Communication: No family currently at bedside  Disposition Plan:  Level of care: Stepdown Status is: Inpatient Remains inpatient appropriate because: Needs further clinical improvement and clearance by specialists   Consultants:  Infectious diseases Cardiology Discussed the case with pulmonary Dr. Vassie Loll Discussed the case with GI Dr. Dulce Sellar  Procedures:  As delineated as above  Antimicrobials:  Anti-infectives (From admission, onward)    Start     Dose/Rate Route Frequency Ordered Stop   08/04/23 1200  ampicillin (OMNIPEN) 2 g in sodium chloride 0.9 % 100 mL IVPB        2 g 300 mL/hr over 20 Minutes Intravenous Every 4 hours 08/04/23 1022     08/03/23 0600  Ampicillin-Sulbactam (UNASYN) 3 g in sodium chloride 0.9 % 100 mL IVPB  Status:  Discontinued        3 g 200 mL/hr over 30 Minutes Intravenous Every 6 hours 08/02/23 2139 08/04/23 1022   08/02/23 1900  vancomycin (VANCOCIN)  IVPB 1000 mg/200 mL premix  Status:  Discontinued        1,000 mg 200 mL/hr over 60 Minutes Intravenous Every 24 hours 08/01/23 2028 08/02/23 1325   08/02/23 0600  piperacillin-tazobactam (ZOSYN) IVPB 3.375 g  Status:  Discontinued        3.375 g 12.5 mL/hr over 240 Minutes Intravenous Every 8 hours 08/01/23 2028 08/02/23 2139   08/01/23 1830  Vancomycin (VANCOCIN) 1,500 mg in sodium chloride 0.9 % 500 mL IVPB        1,500 mg 250 mL/hr over 120 Minutes Intravenous  Once 08/01/23 1743 08/02/23 0848   08/01/23 1800  ceFEPIme (MAXIPIME) 2 g in sodium chloride 0.9 % 100 mL IVPB        2 g 200 mL/hr over 30 Minutes Intravenous  Once 08/01/23 1743 08/01/23 1849       Subjective: Seen and examined at bedside and was feeling fatigued.  Denying lightheadedness or  dizziness.  Just did not feel well.  Continues to have some bloody stools.  No nausea or vomiting.  Denies any shortness of breath.  Feels very uncomfortable and wants to be propped up on pillows.  Objective: Vitals:   08/07/23 1705 08/07/23 1800 08/07/23 1829 08/07/23 1837  BP: (!) 94/37 (!) 89/41 (!) 85/38   Pulse: 87 87 (!) 104   Resp: (!) 24 (!) 26 17   Temp:      TempSrc:      SpO2: 98% 95% 93% 90%  Weight:      Height:        Intake/Output Summary (Last 24 hours) at 08/07/2023 1924 Last data filed at 08/07/2023 1806 Gross per 24 hour  Intake 2888.24 ml  Output 650 ml  Net 2238.24 ml   Filed Weights   08/02/23 1839  Weight: 69.5 kg   Examination: Physical Exam:  Constitutional: Overweight chronically ill-appearing elderly Caucasian female who appears uncomfortable Respiratory: Diminished to auscultation bilaterally with some coarse breath sounds and has some slight crackles and rhonchi.  No appreciable wheezing or rales.  Has a normal respiratory effort is not tachypneic but is wearing supplemental oxygen via nasal cannula Cardiovascular: Irregularly irregular and is now in a flutter which is rate controlled, no  murmurs / rubs / gallops. S1 and S2 auscultated.  Lower extremities are wrapped in Unna boots Abdomen: Soft, non-tender, non-distended. Bowel sounds positive.  GU: Deferred. Musculoskeletal: No clubbing / cyanosis of digits/nails. No joint deformity upper and lower extremities.  Skin: Has facial bruising noted but no appreciable rashes or lesions on limited skin evaluation no induration; Warm and dry.  Neurologic: CN 2-12 grossly intact with no focal deficits. Romberg sign and cerebellar reflexes not assessed.  Psychiatric: Normal judgment and insight. Alert and oriented x 3.  Appears little anxious  Data Reviewed: I have personally reviewed following labs and imaging studies  CBC: Recent Labs  Lab 08/01/23 1650 08/02/23 0257 08/03/23 0303 08/04/23 0259 08/05/23 0300 08/06/23 0250 08/06/23 1733 08/06/23 2358 08/07/23 0501 08/07/23 1010 08/07/23 1632  WBC 20.5* 19.6*   < > 18.0* 21.1* 22.0*  --  21.2* 18.8*  --   --   NEUTROABS 18.2* 18.8*  --   --   --  20.2*  --   --  17.2*  --   --   HGB 14.9 14.9   < > 13.3 14.1 13.0 10.9* 10.5* 10.1* 9.9* 9.5*  HCT 47.5* 47.6*   < > 43.8 45.3 43.1 34.8* 32.9* 33.1* 31.4* 30.2*  MCV 95.4 94.1   < > 95.6 94.2 95.6  --  93.7 95.1  --   --   PLT 171 157   < > 128* 117* 108*  --  114* 109*  --   --    < > = values in this interval not displayed.   Basic Metabolic Panel: Recent Labs  Lab 08/03/23 0303 08/04/23 0259 08/05/23 0300 08/06/23 0250 08/06/23 1955 08/07/23 0501  NA 145 141 141 141 141 140  K 3.5 3.0* 3.7 3.3* 3.1* 4.4  CL 97* 94* 93* 94* 103 95*  CO2 40* 39* 41* 37* 32 39*  GLUCOSE 93 71 70 108* 128* 132*  BUN 45* 33* 26* 23 24* 28*  CREATININE 0.75 0.63 0.49 0.49 0.50 0.65  CALCIUM 7.8* 7.2* 7.6* 7.4* 6.0* 7.7*  MG 1.9 1.6* 2.5* 2.1  --  2.1  PHOS 2.7  --   --  2.6  --  2.6  GFR: Estimated Creatinine Clearance: 52.1 mL/min (by C-G formula based on SCr of 0.65 mg/dL). Liver Function Tests: Recent Labs  Lab  08/01/23 1650 08/06/23 0250 08/06/23 1955 08/07/23 0501  AST 47* 23 19 21   ALT 52* 34 25 31  ALKPHOS 86 62 38 51  BILITOT 0.7 1.1 0.9 0.7  PROT 5.0* 4.1* 3.2* 3.9*  ALBUMIN 2.5* 2.0* 1.8* 2.2*   No results for input(s): "LIPASE", "AMYLASE" in the last 168 hours. No results for input(s): "AMMONIA" in the last 168 hours. Coagulation Profile: No results for input(s): "INR", "PROTIME" in the last 168 hours. Cardiac Enzymes: No results for input(s): "CKTOTAL", "CKMB", "CKMBINDEX", "TROPONINI" in the last 168 hours. BNP (last 3 results) Recent Labs    02/22/23 1136 07/02/23 1645 07/09/23 1131  PROBNP 5,414* 21,913* 33,547*   HbA1C: No results for input(s): "HGBA1C" in the last 72 hours. CBG: Recent Labs  Lab 08/07/23 0017 08/07/23 0352 08/07/23 0753 08/07/23 1154 08/07/23 1531  GLUCAP 147* 139* 120* 101* 108*   Lipid Profile: No results for input(s): "CHOL", "HDL", "LDLCALC", "TRIG", "CHOLHDL", "LDLDIRECT" in the last 72 hours. Thyroid Function Tests: No results for input(s): "TSH", "T4TOTAL", "FREET4", "T3FREE", "THYROIDAB" in the last 72 hours. Anemia Panel: No results for input(s): "VITAMINB12", "FOLATE", "FERRITIN", "TIBC", "IRON", "RETICCTPCT" in the last 72 hours. Sepsis Labs: Recent Labs  Lab 08/01/23 1654 08/01/23 1844 08/02/23 0257  PROCALCITON  --   --  <0.10  LATICACIDVEN 2.4* 2.1*  --     Recent Results (from the past 240 hours)  Resp panel by RT-PCR (RSV, Flu A&B, Covid) Anterior Nasal Swab     Status: None   Collection Time: 07/30/23  9:26 PM   Specimen: Anterior Nasal Swab  Result Value Ref Range Status   SARS Coronavirus 2 by RT PCR NEGATIVE NEGATIVE Final   Influenza A by PCR NEGATIVE NEGATIVE Final   Influenza B by PCR NEGATIVE NEGATIVE Final    Comment: (NOTE) The Xpert Xpress SARS-CoV-2/FLU/RSV plus assay is intended as an aid in the diagnosis of influenza from Nasopharyngeal swab specimens and should not be used as a sole basis for  treatment. Nasal washings and aspirates are unacceptable for Xpert Xpress SARS-CoV-2/FLU/RSV testing.  Fact Sheet for Patients: BloggerCourse.com  Fact Sheet for Healthcare Providers: SeriousBroker.it  This test is not yet approved or cleared by the [place] FDA and has been authorized for detection and/or diagnosis of SARS-CoV-2 by FDA under an Emergency Use Authorization (EUA). This EUA will remain in effect (meaning this test can be used) for the duration of the COVID-19 declaration under Section 564(b)(1) of the Act, 21 U.S.C. section 360bbb-3(b)(1), unless the authorization is terminated or revoked.     Resp Syncytial Virus by PCR NEGATIVE NEGATIVE Final    Comment: (NOTE) Fact Sheet for Patients: BloggerCourse.com  Fact Sheet for Healthcare Providers: SeriousBroker.it  This test is not yet approved or cleared by the [place] FDA and has been authorized for detection and/or diagnosis of SARS-CoV-2 by FDA under an Emergency Use Authorization (EUA). This EUA will remain in effect (meaning this test can be used) for the duration of the COVID-19 declaration under Section 564(b)(1) of the Act, 21 U.S.C. section 360bbb-3(b)(1), unless the authorization is terminated or revoked.  Performed at [facility], [address], [place], [place] [address]   Resp panel by RT-PCR (RSV, Flu A&B, Covid) Anterior Nasal Swab     Status: None   Collection Time: 08/01/23  4:43 PM  Specimen: Anterior Nasal Swab  Result Value Ref Range Status   SARS Coronavirus 2 by RT PCR NEGATIVE NEGATIVE Final    Comment: (NOTE) SARS-CoV-2 target nucleic acids are NOT DETECTED.  The SARS-CoV-2 RNA is generally detectable in upper respiratory specimens during the acute phase of infection. The lowest concentration of SARS-CoV-2 viral copies this assay can detect is 138 copies/mL. A  negative result does not preclude SARS-Cov-2 infection and should not be used as the sole basis for treatment or other patient management decisions. A negative result may occur with  improper specimen collection/handling, submission of specimen other than nasopharyngeal swab, presence of viral mutation(s) within the areas targeted by this assay, and inadequate number of viral copies(<138 copies/mL). A negative result must be combined with clinical observations, patient history, and epidemiological information. The expected result is Negative.  Fact Sheet for Patients:  BloggerCourse.com  Fact Sheet for Healthcare Providers:  SeriousBroker.it  This test is no t yet approved or cleared by the [place] FDA and  has been authorized for detection and/or diagnosis of SARS-CoV-2 by FDA under an Emergency Use Authorization (EUA). This EUA will remain  in effect (meaning this test can be used) for the duration of the COVID-19 declaration under Section 564(b)(1) of the Act, 21 U.S.C.section 360bbb-3(b)(1), unless the authorization is terminated  or revoked sooner.       Influenza A by PCR NEGATIVE NEGATIVE Final   Influenza B by PCR NEGATIVE NEGATIVE Final    Comment: (NOTE) The Xpert Xpress SARS-CoV-2/FLU/RSV plus assay is intended as an aid in the diagnosis of influenza from Nasopharyngeal swab specimens and should not be used as a sole basis for treatment. Nasal washings and aspirates are unacceptable for Xpert Xpress SARS-CoV-2/FLU/RSV testing.  Fact Sheet for Patients: BloggerCourse.com  Fact Sheet for Healthcare Providers: SeriousBroker.it  This test is not yet approved or cleared by the [place] FDA and has been authorized for detection and/or diagnosis of SARS-CoV-2 by FDA under an Emergency Use Authorization (EUA). This EUA will remain in effect (meaning this test can  be used) for the duration of the COVID-19 declaration under Section 564(b)(1) of the Act, 21 U.S.C. section 360bbb-3(b)(1), unless the authorization is terminated or revoked.     Resp Syncytial Virus by PCR NEGATIVE NEGATIVE Final    Comment: (NOTE) Fact Sheet for Patients: BloggerCourse.com  Fact Sheet for Healthcare Providers: SeriousBroker.it  This test is not yet approved or cleared by the [place] FDA and has been authorized for detection and/or diagnosis of SARS-CoV-2 by FDA under an Emergency Use Authorization (EUA). This EUA will remain in effect (meaning this test can be used) for the duration of the COVID-19 declaration under Section 564(b)(1) of the Act, 21 U.S.C. section 360bbb-3(b)(1), unless the authorization is terminated or revoked.  Performed at [facility], [address], [place], [place] [address]   Culture, blood (routine x 2)     Status: Abnormal   Collection Time: 08/01/23  4:50 PM   Specimen: BLOOD  Result Value Ref Range Status   Specimen Description   Final    BLOOD LEFT ANTECUBITAL Performed at [facility], [address], [place], [place] [address]    Special Requests   Final    BOTTLES DRAWN AEROBIC AND ANAEROBIC Blood Culture results may not be optimal due to an inadequate volume of blood received in culture bottles Performed at [facility], [address] [place], [place] [address]    Culture  Setup Time   Final    GRAM POSITIVE COCCI ANAEROBIC BOTTLE ONLY CRITICAL RESULT CALLED TO, READ BACK BY AND VERIFIED WITH: PHARMD CHRISTINE SHADE ON 08/02/23 @ 1814 BY DRT GRAM POSITIVE RODS AEROBIC BOTTLE ONLY CRITICAL RESULT CALLED TO, READ BACK BY AND VERIFIED WITH: PHARMD CHRISTINE SHADE ON 08/02/23 @ 1817 BY DRT    Culture (A)  Final    ENTEROCOCCUS FAECALIS CORYNEBACTERIUM STRIATUM Standardized susceptibility testing for this  organism is not available. Performed at [facility], [address], [place], [place] [address]    Report Status 08/04/2023 FINAL  Final   Organism ID, Bacteria ENTEROCOCCUS FAECALIS  Final      Susceptibility   Enterococcus faecalis - MIC*    AMPICILLIN <=2 SENSITIVE Sensitive     VANCOMYCIN 1 SENSITIVE Sensitive     GENTAMICIN SYNERGY SENSITIVE Sensitive     * ENTEROCOCCUS FAECALIS  Blood Culture ID Panel (Reflexed)     Status: Abnormal   Collection Time: 08/01/23  4:50 PM  Result Value Ref Range Status   Enterococcus faecalis DETECTED (A) NOT DETECTED Final    Comment: CRITICAL RESULT CALLED TO, READ BACK BY AND VERIFIED WITH: PHARMD CHRISTINE SHADE ON 08/02/23 @ 1814 BY DRT    Enterococcus Faecium NOT DETECTED NOT DETECTED Final   Listeria monocytogenes NOT DETECTED NOT DETECTED Final   Staphylococcus species NOT DETECTED NOT DETECTED Final   Staphylococcus aureus (BCID) NOT DETECTED NOT DETECTED Final   Staphylococcus epidermidis NOT DETECTED NOT DETECTED Final   Staphylococcus lugdunensis NOT DETECTED NOT DETECTED Final   Streptococcus species NOT DETECTED NOT DETECTED Final   Streptococcus agalactiae NOT DETECTED NOT DETECTED Final   Streptococcus pneumoniae NOT DETECTED NOT DETECTED Final   Streptococcus pyogenes NOT DETECTED NOT DETECTED Final   A.calcoaceticus-baumannii NOT DETECTED NOT DETECTED Final   Bacteroides fragilis NOT DETECTED NOT DETECTED Final   Enterobacterales NOT DETECTED NOT DETECTED Final   Enterobacter cloacae complex NOT DETECTED NOT DETECTED Final   Escherichia coli NOT DETECTED NOT DETECTED Final   Klebsiella aerogenes NOT DETECTED NOT DETECTED Final   Klebsiella oxytoca NOT DETECTED NOT DETECTED Final   Klebsiella pneumoniae NOT DETECTED NOT DETECTED Final   Proteus species NOT DETECTED NOT DETECTED Final   Salmonella species NOT DETECTED NOT DETECTED Final   Serratia marcescens NOT DETECTED NOT DETECTED Final   Haemophilus influenzae NOT  DETECTED NOT DETECTED Final   Neisseria meningitidis NOT DETECTED NOT DETECTED Final   Pseudomonas aeruginosa NOT DETECTED NOT DETECTED Final   Stenotrophomonas maltophilia NOT DETECTED NOT DETECTED Final   Candida albicans NOT DETECTED NOT DETECTED Final   Candida auris NOT DETECTED NOT DETECTED Final   Candida glabrata NOT DETECTED NOT DETECTED Final   Candida krusei NOT DETECTED NOT DETECTED Final   Candida parapsilosis NOT DETECTED NOT DETECTED Final   Candida tropicalis NOT DETECTED NOT DETECTED Final   Cryptococcus neoformans/gattii NOT DETECTED NOT DETECTED Final   Vancomycin resistance NOT DETECTED NOT DETECTED Final    Comment: Performed at [facility], [address], [place], [place] [address]  Urine Culture     Status: Abnormal   Collection Time: 08/01/23  5:02 PM   Specimen: Urine, Random  Result Value Ref Range Status   Specimen Description   Final    URINE, RANDOM Performed at [facility], [address], [place], [place] [address]    Special Requests   Final    NONE Reflexed from [id number] Performed at [facility]  [facility], [address] [place], [place] [address]    Culture (A)  Final    <10,000 COLONIES/mL INSIGNIFICANT GROWTH Performed at [facility], [address], [place], [place] [address]    Report Status 08/03/2023 FINAL  Final  Culture, blood (routine x 2)     Status: None   Collection Time: 08/01/23  5:09 PM   Specimen: BLOOD  Result Value Ref Range Status   Specimen Description   Final    BLOOD BLOOD RIGHT HAND Performed at [facility], [address], [place], [place] [address]    Special Requests   Final    BOTTLES DRAWN AEROBIC ONLY Blood Culture results may not be optimal due to an inadequate volume of blood received in culture bottles Performed at [facility], [address], [place], [place] [address]    Culture   Final    NO GROWTH 5 DAYS Performed at  [facility], [address], [place], [place] [address]    Report Status 08/06/2023 FINAL  Final  MRSA Next Gen by PCR, Nasal     Status: None   Collection Time: 08/01/23  9:15 PM   Specimen: Nasal Mucosa; Nasal Swab  Result Value Ref Range Status   MRSA by PCR Next Gen NOT DETECTED NOT DETECTED Final    Comment: (NOTE) The GeneXpert MRSA Assay (FDA approved for NASAL specimens only), is one component of a comprehensive MRSA colonization surveillance program. It is not intended to diagnose MRSA infection nor to guide or monitor treatment for MRSA infections. Test performance is not FDA approved in patients less than 37 years old. Performed at [facility], [address], [place], [place] [address]   Culture, blood (Routine X 2) w Reflex to ID Panel     Status: None (Preliminary result)   Collection Time: 08/03/23  3:03 AM   Specimen: BLOOD  Result Value Ref Range Status   Specimen Description   Final    BLOOD BLOOD RIGHT ARM Performed at [facility], [address], [place], [place] [address]    Special Requests   Final    BOTTLES DRAWN AEROBIC AND ANAEROBIC Blood Culture adequate volume Performed at [facility], [address] [place], [place] [address]    Culture   Final    NO GROWTH 4 DAYS Performed at [facility], [address] [place], [place] [address]    Report Status PENDING  Incomplete  Culture, blood (Routine X 2) w Reflex to ID Panel     Status: None (Preliminary result)   Collection Time: 08/03/23  5:49 AM   Specimen: BLOOD RIGHT ARM  Result Value Ref Range Status   Specimen Description   Final    BLOOD RIGHT ARM Performed at [facility], [address], [place], [place] [address]    Special Requests   Final    BOTTLES DRAWN AEROBIC AND ANAEROBIC Blood Culture results may not be optimal due to an inadequate volume of blood received in culture bottles Performed at [facility], [address] [place], [place] [address]    Culture   Final    NO GROWTH 4 DAYS Performed at [facility], [address], [place], [place] [address]    Report Status PENDING  Incomplete     Radiology Studies: DG CHEST PORT 1 VIEW Result Date: 08/07/2023 CLINICAL DATA:  Shortness of  breath. EXAM: PORTABLE CHEST 1 VIEW COMPARISON:  Chest radiograph dated 08/06/2023. FINDINGS: The heart is borderline enlarged. Vascular calcifications are seen in the aortic arch. Opacity of the left lung base likely represents a moderate pleural effusion with associated atelectasis/airspace disease. There is likely a trace right pleural effusion with associated atelectasis. No pneumothorax on either side. A right upper extremity peripherally inserted central venous catheter tip overlies the superior cavoatrial junction. Degenerative changes are seen in the spine. IMPRESSION: Moderate left and trace right pleural effusions with associated atelectasis/airspace disease. Electronically Signed   By: Romona Curls M.D.   On: 08/07/2023 10:08   [place] EKG SITE RITE Result Date: 08/06/2023 If Site Rite image not attached, placement could not be confirmed due to current cardiac rhythm.  DG CHEST PORT 1 VIEW Result Date: 08/06/2023 CLINICAL DATA:  Shortness of breath. EXAM: PORTABLE CHEST 1 VIEW COMPARISON:  July 31, 2013. FINDINGS: Stable cardiomediastinal silhouette. Status post coronary bypass graft. Decreased bilateral opacities are noted suggesting improving edema or pneumonia. Bony thorax is unremarkable. IMPRESSION: Decreased bibasilar opacities suggesting improving edema or pneumonia. Electronically Signed   By: Lupita Raider M.D.   On: 08/06/2023 08:32   Scheduled Meds:  amiodarone  200 mg Oral Daily   amitriptyline  50 mg Oral QHS   Chlorhexidine Gluconate Cloth  6 each Topical Daily   digoxin  0.125 mg Oral Daily   docusate sodium  100 mg Oral BID   Gerhardt's butt cream    Topical Daily   hydrocortisone  25 mg Rectal BID   levothyroxine  88 mcg Oral QAC breakfast   midodrine  10 mg Oral TID WC   mometasone-formoterol  2 puff Inhalation BID   multivitamin with minerals  1 tablet Oral Daily   pantoprazole (PROTONIX) IV  40 mg Intravenous Q12H   polyethylene glycol  17 g Oral BID   senna-docusate  1 tablet Oral BID   sodium chloride flush  10-40 mL Intracatheter Q12H   Continuous Infusions:  ampicillin (OMNIPEN) IV Stopped (08/07/23 1647)   sodium chloride       LOS: 6 days   The patient is critically ill with multiple organ system failure and requires high complexity decision making for assessment and support, frequent evaluation and titration of therapies and application of advanced monitoring technologies and extensive interpretation of multiple databases  CRITICAL CARE TIME: 36 nonconsecutive minutes devoted to patient care services described in this note including but not limited to reviewing the chart, seeing the patient, coordinating care, updating family if available and discussed with any consultants  Marguerita Merles, DO [facility] Available via Epic secure chat 7am-7pm After these hours, please refer to coverage provider listed on [email or web address] 08/07/2023, 7:24 PM

## 2023-08-07 NOTE — Plan of Care (Signed)
  Problem: Fluid Volume: Goal: Ability to maintain a balanced intake and output will improve Outcome: Not Progressing   Problem: Nutritional: Goal: Maintenance of adequate nutrition will improve Outcome: Not Progressing   Problem: Skin Integrity: Goal: Risk for impaired skin integrity will decrease Outcome: Not Progressing

## 2023-08-07 NOTE — Progress Notes (Signed)
    Patient Name: Denise Mckenzie           DOB: [birth year]  MRN: [id number]      Admission Date: 08/01/2023  Attending Provider: Merlene Laughter, DO  Primary Diagnosis: Acute on chronic respiratory failure with hypercapnia [facility])   Level of care: Stepdown    CROSS COVER NOTE   Date of Service   08/07/2023   [name], 77 y.o. female, was admitted on 08/01/2023 for Acute on chronic respiratory failure with hypercapnia (HCC).    HPI/Events of Note   PICC ordered due to limited venous access and multiple IV meds being incompatible.   Interventions/ Plan   PICC placement        Anthoney Harada, DNP, ACNPC- AG [facility]

## 2023-08-07 NOTE — Progress Notes (Signed)
PHARMACY - ANTICOAGULATION CONSULT NOTE  Pharmacy Consult for IV heparin Indication: Afib  No Known Allergies  Patient Measurements: Height: 5\' 2"  (157.5 cm) Weight: 69.5 kg (153 lb 3.5 oz) IBW/kg (Calculated) : 50.1 Heparin Dosing Weight: 65 kg  Vital Signs: Temp: 97.9 F (36.6 C) (12/21 0341) Temp Source: Axillary (12/21 0341) BP: 110/23 (12/21 0505) Pulse Rate: 58 (12/21 0505)  Labs: Recent Labs    08/06/23 0250 08/06/23 1733 08/06/23 1955 08/06/23 2358 08/07/23 0501  HGB 13.0 10.9*  --  10.5* 10.1*  HCT 43.1 34.8*  --  32.9* 33.1*  PLT 108*  --   --  114* 109*  APTT  --   --   --   --  140*  HEPARINUNFRC  --   --   --   --  >1.10*  CREATININE 0.49  --  0.50  --  0.65    Estimated Creatinine Clearance: 52.1 mL/min (by C-G formula based on SCr of 0.65 mg/dL).   Medical History: Past Medical History:  Diagnosis Date   Acute exacerbation of CHF (congestive heart failure) (HCC) 07/12/2023   Arthritis    Breast cancer (HCC) dx'd 09/2015   COPD (chronic obstructive pulmonary disease) (HCC)    Coronary artery disease    Depression    Diabetes mellitus (HCC) 10/22/2015   Full dentures    full upper dentures, plate on bottom   History of bronchitis    History of pneumonia    Hyperlipidemia    Hypertension    Hypothyroidism 10/22/2015   lung ca dx;d 09/2015   lung cancer   Myocardial infarction [facility]) July 1995   Osteoporosis 10/22/2015   Personal history of radiation therapy    Stress incontinence    Uterine cancer (HCC) dx'd 1968    Medications:  Medications Prior to Admission  Medication Sig Dispense Refill Last Dose/Taking   albuterol (VENTOLIN HFA) 108 (90 Base) MCG/ACT inhaler Inhale 2 puffs into the lungs 4 (four) times daily as needed for shortness of breath.   08/01/2023 Evening   amiodarone (PACERONE) 400 MG tablet Take 1 tablet (400 mg total) by mouth 2 (two) times daily.   08/01/2023 Morning   amitriptyline (ELAVIL) 50 MG tablet Take 50 mg by mouth at  bedtime.     07/31/2023 Bedtime   apixaban (ELIQUIS) 5 MG TABS tablet Take 1 tablet (5 mg total) by mouth 2 (two) times daily.   08/01/2023 at  9:12 AM   atorvastatin (LIPITOR) 40 MG tablet Take 40 mg by mouth at bedtime.    07/31/2023 Bedtime   busPIRone (BUSPAR) 5 MG tablet Take 5 mg by mouth 2 (two) times daily.   07/30/2023 Morning   Calamine-Zinc Oxide LOTN Apply 1 application  topically See admin instructions. On Monday and Thursday   07/29/2023 Evening   Calcium Citrate-Vitamin D (CALCIUM + D PO) Take 2 tablets by mouth 3 (three) times daily. Each tablet Calcium 300 mg, Vitamin D3 250 I.U.   08/01/2023 Evening   Cholecalciferol (VITAMIN D3) 50 MCG (2000 UT) CAPS Take 2,000 Units by mouth daily.   08/01/2023 Morning   digoxin (LANOXIN) 0.125 MG tablet Take 1 tablet (0.125 mg total) by mouth daily.   07/31/2023 Evening   diltiazem (CARDIZEM CD) 120 MG 24 hr capsule Take 1 capsule (120 mg total) by mouth daily.   08/01/2023 Morning   docusate sodium (COLACE) 100 MG capsule Take 1 capsule (100 mg total) by mouth 2 (two) times daily. (Patient taking differently: Take 100  mg by mouth 2 (two) times daily as needed for moderate constipation.)   Taking Differently   doxycycline (ADOXA) 100 MG tablet Take 100 mg by mouth 2 (two) times daily.   08/01/2023 Morning   FARXIGA 10 MG TABS tablet TAKE 1 TABLET BY MOUTH DAILY BEFORE BREAKFAST. 90 tablet 1 08/01/2023 Morning   ketoconazole (NIZORAL) 2 % cream Apply 1 application topically daily. (Patient taking differently: Apply 1 application  topically daily as needed for irritation.) 15 g 6 Taking Differently   KLOR-CON M20 20 MEQ tablet TAKE 1 TABLET BY MOUTH TWICE A DAY 180 tablet 1 08/01/2023 Morning   lactulose (CHRONULAC) 10 GM/15ML solution Take 30 mLs (20 g total) by mouth 2 (two) times daily as needed for mild constipation.   Taking As Needed   levalbuterol (XOPENEX) 0.63 MG/3ML nebulizer solution Take 3 mLs (0.63 mg total) by nebulization every 6  (six) hours as needed for wheezing or shortness of breath.   Taking As Needed   levothyroxine (SYNTHROID, LEVOTHROID) 88 MCG tablet Take 88 mcg by mouth daily before breakfast.    08/01/2023 Morning   metFORMIN (GLUCOPHAGE) 500 MG tablet Take 500 mg by mouth daily.  4 08/01/2023 Morning   midodrine (PROAMATINE) 5 MG tablet Take 1 tablet (5 mg total) by mouth 3 (three) times daily with meals.   08/01/2023 Evening   mometasone-formoterol (DULERA) 200-5 MCG/ACT AERO Inhale 2 puffs into the lungs 2 (two) times daily.   08/01/2023 Morning   nitroGLYCERIN (NITROSTAT) 0.4 MG SL tablet Place 1 tablet (0.4 mg total) under the tongue every 5 (five) minutes as needed for chest pain. Reported on 01/28/2016 30 tablet 3 Taking As Needed   torsemide (DEMADEX) 20 MG tablet Take 2 tablets (40 mg total) by mouth 2 (two) times daily. 120 tablet 5 08/01/2023 Evening   [EXPIRED] predniSONE (DELTASONE) 10 MG tablet Take 4 tablets (40 mg total) by mouth daily with breakfast for 2 days, THEN 3 tablets (30 mg total) daily with breakfast for 2 days, THEN 2 tablets (20 mg total) daily with breakfast for 2 days, THEN 1 tablet (10 mg total) daily with breakfast for 2 days. (Patient not taking: Reported on 08/01/2023)   Not Taking   Scheduled:   amiodarone  200 mg Oral Daily   amitriptyline  50 mg Oral QHS   Chlorhexidine Gluconate Cloth  6 each Topical Daily   digoxin  0.125 mg Oral Daily   docusate sodium  100 mg Oral BID   levothyroxine  88 mcg Oral QAC breakfast   midodrine  10 mg Oral TID WC   mometasone-formoterol  2 puff Inhalation BID   mouth rinse  15 mL Mouth Rinse 4 times per day   polyethylene glycol  17 g Oral BID   senna-docusate  1 tablet Oral BID   sodium chloride flush  10-40 mL Intracatheter Q12H   PRN: acetaminophen **OR** acetaminophen, alum & mag hydroxide-simeth, antiseptic oral rinse, benzonatate, bisacodyl, hydrALAZINE, levalbuterol, lip balm, metoprolol tartrate, nitroGLYCERIN, ondansetron **OR**  ondansetron (ZOFRAN) IV, mouth rinse, sodium chloride flush, sodium chloride flush, traZODone  Assessment: 36 yoF with PMH CAD s/p CABG, COPD, DM2, hypothyroid, HTN/HLD, Hx breast & lung CA, admitted with AECHF. Recently started on Eliquis in November for Afib/RVR and underwent DCCV 11/29. Readmitted with persistent Aflutter; Cardiology planning repeat DCCV once loaded with amiodarone.   On 12/20, some red streaking was noted on stool and FOBT returned positive. Given this along with continued hypotension despite midodrine, Pharmacy consulted to  transition Eliquis to IV heparin infusion.  Baseline INR, aPTT: not done Prior anticoagulation: Eliquis 5 mg PO BID, last dose 12/20 @ 0900   Significant events:  Today, 08/07/2023: Heparin level > 1.1 (falsely elevated due to recent apixaban use) aPTT = 140 sec (supratherapeutic) with heparin gtt @ 800 units/hr.  Patient with only a PICC for IV meds and lab draws.  RN reports heparin level drawn appropriately with pausing heparin infusion and flushing line prior to lab draw Overnight RN reported patient with a large bowel movement with blood noted in the stool.  RN reports provider made aware.  No further bleeding reported overnight. Hgb = 10.1 (low but stable), Pltc 109k  Goal of Therapy: Heparin level 0.3-0.7 units/ml Monitor platelets by anticoagulation protocol: Yes  Plan: Hold heparin infusion x 1 hr Restart heparin gtt @ 650 units/hr Check aPTT 8 hrs after heparin restarted at reduced rate Daily CBC & daily heparin level; aPTTs as needed while heparin levels remain confounded by recent DOAC Monitor for signs of bleeding or thrombosis  Terrilee Files, PharmD 08/07/2023, 6:23 AM '

## 2023-08-08 ENCOUNTER — Inpatient Hospital Stay (HOSPITAL_COMMUNITY): Payer: Medicare Other

## 2023-08-08 DIAGNOSIS — J9622 Acute and chronic respiratory failure with hypercapnia: Secondary | ICD-10-CM | POA: Diagnosis not present

## 2023-08-08 DIAGNOSIS — R7881 Bacteremia: Secondary | ICD-10-CM | POA: Diagnosis not present

## 2023-08-08 DIAGNOSIS — J410 Simple chronic bronchitis: Secondary | ICD-10-CM | POA: Diagnosis not present

## 2023-08-08 DIAGNOSIS — I5032 Chronic diastolic (congestive) heart failure: Secondary | ICD-10-CM | POA: Diagnosis not present

## 2023-08-08 LAB — CBC WITH DIFFERENTIAL/PLATELET
Abs Immature Granulocytes: 0.24 10*3/uL — ABNORMAL HIGH (ref 0.00–0.07)
Basophils Absolute: 0 10*3/uL (ref 0.0–0.1)
Basophils Relative: 0 %
Eosinophils Absolute: 0.2 10*3/uL (ref 0.0–0.5)
Eosinophils Relative: 1 %
HCT: 29.9 % — ABNORMAL LOW (ref 36.0–46.0)
Hemoglobin: 9.1 g/dL — ABNORMAL LOW (ref 12.0–15.0)
Immature Granulocytes: 2 %
Lymphocytes Relative: 2 %
Lymphs Abs: 0.3 10*3/uL — ABNORMAL LOW (ref 0.7–4.0)
MCH: 29.1 pg (ref 26.0–34.0)
MCHC: 30.4 g/dL (ref 30.0–36.0)
MCV: 95.5 fL (ref 80.0–100.0)
Monocytes Absolute: 0.6 10*3/uL (ref 0.1–1.0)
Monocytes Relative: 5 %
Neutro Abs: 12.3 10*3/uL — ABNORMAL HIGH (ref 1.7–7.7)
Neutrophils Relative %: 90 %
Platelets: 102 10*3/uL — ABNORMAL LOW (ref 150–400)
RBC: 3.13 MIL/uL — ABNORMAL LOW (ref 3.87–5.11)
RDW: 16.5 % — ABNORMAL HIGH (ref 11.5–15.5)
WBC: 13.7 10*3/uL — ABNORMAL HIGH (ref 4.0–10.5)
nRBC: 0 % (ref 0.0–0.2)

## 2023-08-08 LAB — COMPREHENSIVE METABOLIC PANEL
ALT: 34 U/L (ref 0–44)
AST: 25 U/L (ref 15–41)
Albumin: 2 g/dL — ABNORMAL LOW (ref 3.5–5.0)
Alkaline Phosphatase: 55 U/L (ref 38–126)
Anion gap: 7 (ref 5–15)
BUN: 26 mg/dL — ABNORMAL HIGH (ref 8–23)
CO2: 36 mmol/L — ABNORMAL HIGH (ref 22–32)
Calcium: 7.5 mg/dL — ABNORMAL LOW (ref 8.9–10.3)
Chloride: 94 mmol/L — ABNORMAL LOW (ref 98–111)
Creatinine, Ser: 0.6 mg/dL (ref 0.44–1.00)
GFR, Estimated: >60 mL/min (ref >60)
Glucose, Bld: 88 mg/dL (ref 70–99)
Potassium: 4.2 mmol/L (ref 3.5–5.1)
Sodium: 137 mmol/L (ref 135–145)
Total Bilirubin: 0.8 mg/dL (ref <1.2)
Total Protein: 3.9 g/dL — ABNORMAL LOW (ref 6.5–8.1)

## 2023-08-08 LAB — CULTURE, BLOOD (ROUTINE X 2)
Culture: NO GROWTH
Culture: NO GROWTH
Special Requests: ADEQUATE

## 2023-08-08 LAB — GLUCOSE, CAPILLARY
Glucose-Capillary: 110 mg/dL — ABNORMAL HIGH (ref 70–99)
Glucose-Capillary: 73 mg/dL (ref 70–99)
Glucose-Capillary: 83 mg/dL (ref 70–99)
Glucose-Capillary: 85 mg/dL (ref 70–99)
Glucose-Capillary: 91 mg/dL (ref 70–99)
Glucose-Capillary: 94 mg/dL (ref 70–99)

## 2023-08-08 LAB — RETICULOCYTES
Immature Retic Fract: 10 % (ref 2.3–15.9)
RBC.: 3.07 MIL/uL — ABNORMAL LOW (ref 3.87–5.11)
Retic Count, Absolute: 25.5 10*3/uL (ref 19.0–186.0)
Retic Ct Pct: 0.8 % (ref 0.4–3.1)

## 2023-08-08 LAB — HEMOGLOBIN AND HEMATOCRIT, BLOOD
HCT: 29.1 % — ABNORMAL LOW (ref 36.0–46.0)
HCT: 29.9 % — ABNORMAL LOW (ref 36.0–46.0)
HCT: 28.4 % — ABNORMAL LOW (ref 36.0–46.0)
Hemoglobin: 9.2 g/dL — ABNORMAL LOW (ref 12.0–15.0)
Hemoglobin: 9.1 g/dL — ABNORMAL LOW (ref 12.0–15.0)
Hemoglobin: 8.9 g/dL — ABNORMAL LOW (ref 12.0–15.0)

## 2023-08-08 LAB — FERRITIN: Ferritin: 185 ng/mL (ref 11–307)

## 2023-08-08 LAB — FOLATE: Folate: 6.5 ng/mL (ref >5.9)

## 2023-08-08 LAB — VITAMIN B12: Vitamin B-12: 315 pg/mL (ref 180–914)

## 2023-08-08 LAB — IRON AND TIBC
Iron: 44 ug/dL (ref 28–170)
Saturation Ratios: 33 % — ABNORMAL HIGH (ref 10.4–31.8)
TIBC: 132 ug/dL — ABNORMAL LOW (ref 250–450)
UIBC: 88 ug/dL

## 2023-08-08 LAB — MAGNESIUM: Magnesium: 2 mg/dL (ref 1.7–2.4)

## 2023-08-08 LAB — PHOSPHORUS: Phosphorus: 2.4 mg/dL — ABNORMAL LOW (ref 2.5–4.6)

## 2023-08-08 NOTE — Progress Notes (Signed)
PROGRESS NOTE    Denise Mckenzie  CZY:[id number] DOB: 12-28-1945 DOA: 08/01/2023 PCP: Burton Apley, MD   Brief Narrative:  The patient is a 77 year old female with past medical history of CAD ( h/o CABG 70), COPD, HTN, HLD, hypothyroidism, T2DM, chronic respiratory failure 3L,  h/o breast and lung cancer.  Patient recently admitted 11/25 - 12/6 with anasarca, CHF exacerbation, A-fib with RVR.  Patient treated with diuresis.  She underwent TEE/DCCV for A-fib.  Patient discharged to [facility].  Patient brought back in after she became short of breath, wheezing and altered.  Initially noted to be in hypercapnic respiratory failure requiring BiPAP.  CT suggestive of possible pneumonia and started on vancomycin and cefepime.  Eventually blood cultures growing GPC/Enterococcus faecalis therefore transition to Unasyn per ID recommendations.  Cardiology following the patient as well.  Echocardiogram showed preserved EF without vegetation.  08/05/2023 cardiology has now signed off the case and recommending anticoagulation and continue amiodarone for now.  She is now retaining some urine still we will do an In-N-Out x 3 and then if she fails another time we will place Foley catheter.  Infectious diseases recommending ampicillin IV every 24 hours with an end date of 08/16/2023 and has made recommendations for OPAT.  She continues to have some hypoglycemia so we will hold her insulin regimen.  Unna boots have been applied now  08/06/2023.  Patient is more hypotensive so given low-dose IV fluids and increase midodrine as well as give a dose of albumin.  Antihypertensives including Cardizem and torsemide have been held.  FOBT is positive now after she had a large bowel movement and will add suppositories and discussed with GI who feels no role for colonoscopy.  Given her low blood pressure discussed with pulmonary who also feel that there is no role for pressors at this point.  Will need to continue monitor  her blood count carefully  08/07/2023.  Continues to be hypotensive today and had more bloody stools and GI bleeding.  Continue to hold her antihypertensives and will give her a bolus of 500 mL.  Blood count continues to drop and now heparin drip will be discontinued and she will be initiated on Anusol suppositories.  There is a low threshold to start pressors given continued hypotension  08/08/2023.  Blood pressure is improved and bleeding is slowed down.  Hemoglobin appears to have stabilized in the low nines.  She feels okay feels a little bit better.  Will need to continue monitor carefully.  Assessment & Plan:  Principal Problem:   Acute on chronic respiratory failure with hypercapnia (HCC) Active Problems:   Coronary artery disease   Hypertension   Hypothyroidism   Primary cancer of right upper lobe of lung (HCC)   COPD (chronic obstructive pulmonary disease) (HCC)   Chronic diastolic (congestive) heart failure (HCC)   PAF (paroxysmal atrial fibrillation) (HCC)   HCAP (healthcare-associated pneumonia)   Hypotension   Enterococcus faecalis infection   Bacteremia   Typical atrial flutter (HCC)   Acute on chronic hypercapnic respiratory failure Healthcare acquired pneumonia Enterococcus faecalis bacteremia History of COPD -Weaned off BiPAP.  MRSA swab negative.  Blood cultures growing GPC/Enterococcus faecalis.  Antibiotics per ID.  Echo ef 65%, enlarged RV.  Seen by speech and swallow, recommending regular diet with aspiration precautions. -Placed on empiric prednisone daily for 5 days which is likely caused her leukocytosis to remain elevated; WBC Trend: Recent Labs  Lab 08/03/23 0303 08/04/23 0259 08/05/23 0300 08/06/23 0250 08/06/23 2358  08/07/23 0501 08/08/23 0343  WBC 19.2* 18.0* 21.1* 22.0* 21.2* 18.8* 13.7*  -ID was following recommending outpatient antibiotics with OPAT orders for ampicillin every 24 hours and will continue while she is hospitalized -COVID, flu,  RSV negative, urine strep antigen-negative -Continue with Dulera 2 puffs IH twice daily as well as Xopenex 0.63 mg neb every 6 as needed for wheezing and shortness of breath -Given her hypotension will need to monitor carefully -PT OT recommending SNF when she is medically stable but will need to ensure that she is no longer bleeding and make sure that her blood pressure and heart rate are stable.  Hypotension, persistent -In setting of infection; will hold her antihypertensives with Cardizem and torsemide -IV fluid hydration is stopped and was given a dose of albumin the day before yesterday -Increased Midodrine from 5 mg 3 times daily to 10 mg 3 times daily -Blood pressure goal with a systolic blood pressure greater than 90 however she is now having blood pressures less than 90 so we will give her a 500 mL bolus -Monitor for further signs and symptoms bleeding -Discussed with pulmonary the day before yesterday who felt that pressors do not need to be initiated at this point and recommending close monitoring however she may decompensate and pressors may need to be initiated specifically with her GI bleeding now.  Blood pressures improved to the point where she does not need pressors  Diabetes mellitus type 2 -Sliding scale and Accu-Cheks -CBG Trend: Recent Labs  Lab 08/07/23 2009 08/07/23 2310 08/08/23 0349 08/08/23 0742 08/08/23 1205 08/08/23 1709 08/08/23 1905  GLUCAP 95 83 85 83 73 110* 94   History of coronary artery disease with history of CABG in 1996 CHF with preserved EF with severe RV failure/dilation -Echo shows EF 65%, elevated PASP, severe RV failure.  Signs of third spacing but no obvious intravascular volume overload.  Continue home torsemide 40 mg orally daily and Unna boots -BNP Trend:  Recent Labs  Lab 07/12/23 1434 07/13/23 0038 07/25/23 2240 07/30/23 2126 08/01/23 1650 08/03/23 0303  BNP 2,311.4* 2,289.1* 1,292.5* 1,287.7* 862.2* 528.7*  -Home meds Eliquis,  statin, Cardizem, Marcelline Deist but will hold her Cardizem for now and Farxiga -Eliquis is being held and will be changed to heparin drip which was done yesterday but is now will be held again given her GI bleeding  FOBT positive and bright red blood per rectum -Had a drop in her hemoglobin and had red blood smeared in her stool after she was constipated for quite a while yesterday and now has had more maroon and bloody stools which are now bright red -Hgb/Hct Trend: Recent Labs  Lab 08/07/23 0501 08/07/23 1010 08/07/23 1632 08/07/23 2204 08/08/23 0343 08/08/23 0928 08/08/23 1535  HGB 10.1* 9.9* 9.5* 9.3* 9.1* 9.2* 9.1*  HCT 33.1* 31.4* 30.2* 28.9* 29.9* 29.1* 29.9*  MCV 95.1  --   --   --  95.5  --   --   -Checked Anemia Panel and showed an iron level 44, UIBC of 88, TIBC 132,, saturation ratio 33%, ferritin of 185, folate level 6.5 and vitamin B12 350 -Continue to monitor for signs and symptoms bleeding; has some GI bleeding noted and bright red blood per rectum -Continue to monitor H&H's Q6 -Discussed with GI Dr. Dulce Sellar who feels that there is no role for colonoscopy given how ill the patient is at this time and recommended that she diffuse active GI bleeding they are recommending a stat CT angio of the abdomen  pelvis -Will hold her anticoagulation now given the bleeding but now hemoglobin appears to have stabilized and may need to retry to restart it in the next day or so  Paroxysmal atrial fibrillation status post cardioversion 07/16/2023 -Currently on amiodarone, digoxin, Cardizem (HOLD).  Eliquis was continued but will be changing to heparin drip: Heparin drip has now been held given her GI bleeding -seen by cardiology they have signed off and scheduled for follow-up appointment outpatient setting -Reconsulted cardiology given her tachycardic rates and they felt that telemetry was repeatedly currently on flutter waves and with her ventricular rates consistently less than 100 bpm so now  signed off the case again -Unfortunately given her hypotension we had to hold her Cardizem and torsemide  History of right upper lobe lung cancer History of breast cancer -Both in remission  Acute Urinary Retention -Has been wheelchair-bound basically since a few months ago -Do In and Out cath x 3 and if necessary will place Foley catheter and start her on tamsulosin if able -Foley was placed and will need trial of void prior to discharge likely  Hyperlipidemia -Holding Statin given slightly abnormal LFTs  Constipation -Bowel regimen initiated  Hypothyroidism -Continue with Levothyroxine 88 mcg po Daily  Abnormal LFTs, improved  -LFT Trend: Recent Labs  Lab 07/12/23 1500 08/01/23 1650 08/06/23 0250 08/06/23 1955 08/07/23 0501 08/08/23 0343  AST 38 47* 23 19 21 25   ALT 33 52* 34 25 31 34  -Continue to Monitor and Trend and repeat CMP in the AM  Thrombocytopenia, slowly dropping  -Platelet Count Trend: Recent Labs  Lab 08/03/23 0303 08/04/23 0259 08/05/23 0300 08/06/23 0250 08/06/23 2358 08/07/23 0501 08/08/23 0343  PLT 141* 128* 117* 108* 114* 109* 102*  -Continue to Monitor and Trend and repeat CBC in the AM  Hypoalbuminemia -Patient's Albumin Trend: Recent Labs  Lab 07/12/23 1500 08/01/23 1650 08/06/23 0250 08/06/23 1955 08/07/23 0501 08/08/23 0343  ALBUMIN 3.9 2.5* 2.0* 1.8* 2.2* 2.0*  -Continue to Monitor and Trend and repeat CMP in the AM  Overweight  -Complicates overall prognosis and care -Estimated body mass index is 28.19 kg/m as calculated from the following:   Height as of this encounter: 5\' 2"  (1.575 m).   Weight as of this encounter: 69.9 kg.  -Weight Loss and Dietary Counseling given   DVT prophylaxis: Anticoagulation is being held    Code Status: Full Code Family Communication: No family care at bedside  Disposition Plan:  Level of care: Stepdown Status is: Inpatient Remains inpatient appropriate because: Needs further  clinical improvement and clearance   Consultants:  Infectious Diseases Cardiology Discussed the case with pulmonary Dr. Vassie Loll Discussed the case with GI Dr. Dulce Sellar  Procedures:  As delineated as above  Antimicrobials:  Anti-infectives (From admission, onward)    Start     Dose/Rate Route Frequency Ordered Stop   08/04/23 1200  ampicillin (OMNIPEN) 2 g in sodium chloride 0.9 % 100 mL IVPB        2 g 300 mL/hr over 20 Minutes Intravenous Every 4 hours 08/04/23 1022     08/03/23 0600  Ampicillin-Sulbactam (UNASYN) 3 g in sodium chloride 0.9 % 100 mL IVPB  Status:  Discontinued        3 g 200 mL/hr over 30 Minutes Intravenous Every 6 hours 08/02/23 2139 08/04/23 1022   08/02/23 1900  vancomycin (VANCOCIN) IVPB 1000 mg/200 mL premix  Status:  Discontinued        1,000 mg 200 mL/hr over 60 Minutes  Intravenous Every 24 hours 08/01/23 2028 08/02/23 1325   08/02/23 0600  piperacillin-tazobactam (ZOSYN) IVPB 3.375 g  Status:  Discontinued        3.375 g 12.5 mL/hr over 240 Minutes Intravenous Every 8 hours 08/01/23 2028 08/02/23 2139   08/01/23 1830  Vancomycin (VANCOCIN) 1,500 mg in sodium chloride 0.9 % 500 mL IVPB        1,500 mg 250 mL/hr over 120 Minutes Intravenous  Once 08/01/23 1743 08/02/23 0848   08/01/23 1800  ceFEPIme (MAXIPIME) 2 g in sodium chloride 0.9 % 100 mL IVPB        2 g 200 mL/hr over 30 Minutes Intravenous  Once 08/01/23 1743 08/01/23 1849       Subjective: Seen and examined at bedside and she is doing okay today breathing is stable.  Continues to have some intermittent pain.  Blood pressure is doing better.  Has not had any further bloody bowel movements noted.  No other concerns at this time.  Objective: Vitals:   08/08/23 1600 08/08/23 1700 08/08/23 1800 08/08/23 1917  BP: (!) 116/48 (!) 121/55 (!) 128/39   Pulse: (!) 109 99 83   Resp: (!) 24 (!) 27 20   Temp: 98 F (36.7 C)   98.1 F (36.7 C)  TempSrc: Oral   Oral  SpO2: 96% 94% 98%   Weight:       Height:        Intake/Output Summary (Last 24 hours) at 08/08/2023 2018 Last data filed at 08/08/2023 4098 Gross per 24 hour  Intake 2174.9 ml  Output 1055 ml  Net 1119.9 ml   Filed Weights   08/02/23 1839 08/08/23 0400  Weight: 69.5 kg 69.9 kg   Examination: Physical Exam:  Constitutional: Overweight chronically ill-appearing elderly Caucasian female who appears little bit more comfortable today compared to yesterday Respiratory: Diminished to auscultation bilaterally with some coarse breath sounds, no wheezing, rales, rhonchi or crackles. Normal respiratory effort and patient is not tachypenic. No accessory muscle use.  Cardiovascular: Irregularly irregular and has a flutter, no murmurs / rubs / gallops. S1 and S2 auscultated.  No appreciable extremity edema but has Unna boots on s.  Abdomen: Soft, slightly-tender, non-distended. Bowel sounds positive.  GU: Deferred. Musculoskeletal: No clubbing / cyanosis of digits/nails. No joint deformity upper and lower extremities.  Has a PICC line now Skin: Has some bruising noted but no appreciable rashes or lesions on limited skin evaluation Neurologic: CN 2-12 grossly intact with no focal deficits. Romberg sign and cerebellar reflexes not assessed.  Psychiatric: Normal judgment and insight. Alert and oriented x 3.  Appears a little anxious but not as much as she was yesterday  Data Reviewed: I have personally reviewed following labs and imaging studies  CBC: Recent Labs  Lab 08/02/23 0257 08/03/23 0303 08/05/23 0300 08/06/23 0250 08/06/23 1733 08/06/23 2358 08/07/23 0501 08/07/23 1010 08/07/23 1632 08/07/23 2204 08/08/23 0343 08/08/23 0928 08/08/23 1535  WBC 19.6*   < > 21.1* 22.0*  --  21.2* 18.8*  --   --   --  13.7*  --   --   NEUTROABS 18.8*  --   --  20.2*  --   --  17.2*  --   --   --  12.3*  --   --   HGB 14.9   < > 14.1 13.0   < > 10.5* 10.1*   < > 9.5* 9.3* 9.1* 9.2* 9.1*  HCT 47.6*   < > 45.3 43.1   < >  32.9*  33.1*   < > 30.2* 28.9* 29.9* 29.1* 29.9*  MCV 94.1   < > 94.2 95.6  --  93.7 95.1  --   --   --  95.5  --   --   PLT 157   < > 117* 108*  --  114* 109*  --   --   --  102*  --   --    < > = values in this interval not displayed.   Basic Metabolic Panel: Recent Labs  Lab 08/03/23 0303 08/04/23 0259 08/05/23 0300 08/06/23 0250 08/06/23 1955 08/07/23 0501 08/08/23 0343  NA 145 141 141 141 141 140 137  K 3.5 3.0* 3.7 3.3* 3.1* 4.4 4.2  CL 97* 94* 93* 94* 103 95* 94*  CO2 40* 39* 41* 37* 32 39* 36*  GLUCOSE 93 71 70 108* 128* 132* 88  BUN 45* 33* 26* 23 24* 28* 26*  CREATININE 0.75 0.63 0.49 0.49 0.50 0.65 0.60  CALCIUM 7.8* 7.2* 7.6* 7.4* 6.0* 7.7* 7.5*  MG 1.9 1.6* 2.5* 2.1  --  2.1 2.0  PHOS 2.7  --   --  2.6  --  2.6 2.4*   GFR: Estimated Creatinine Clearance: 52.2 mL/min (by C-G formula based on SCr of 0.6 mg/dL). Liver Function Tests: Recent Labs  Lab 08/06/23 0250 08/06/23 1955 08/07/23 0501 08/08/23 0343  AST 23 19 21 25   ALT 34 25 31 34  ALKPHOS 62 38 51 55  BILITOT 1.1 0.9 0.7 0.8  PROT 4.1* 3.2* 3.9* 3.9*  ALBUMIN 2.0* 1.8* 2.2* 2.0*   No results for input(s): "LIPASE", "AMYLASE" in the last 168 hours. No results for input(s): "AMMONIA" in the last 168 hours. Coagulation Profile: No results for input(s): "INR", "PROTIME" in the last 168 hours. Cardiac Enzymes: No results for input(s): "CKTOTAL", "CKMB", "CKMBINDEX", "TROPONINI" in the last 168 hours. BNP (last 3 results) Recent Labs    02/22/23 1136 07/02/23 1645 07/09/23 1131  PROBNP 5,414* 21,913* 33,547*   HbA1C: No results for input(s): "HGBA1C" in the last 72 hours. CBG: Recent Labs  Lab 08/08/23 0349 08/08/23 0742 08/08/23 1205 08/08/23 1709 08/08/23 1905  GLUCAP 85 83 73 110* 94   Lipid Profile: No results for input(s): "CHOL", "HDL", "LDLCALC", "TRIG", "CHOLHDL", "LDLDIRECT" in the last 72 hours. Thyroid Function Tests: No results for input(s): "TSH", "T4TOTAL", "FREET4", "T3FREE",  "THYROIDAB" in the last 72 hours. Anemia Panel: Recent Labs    08/08/23 0343 08/08/23 0344  VITAMINB12 315  --   FOLATE  --  6.5  FERRITIN 185  --   TIBC 132*  --   IRON 44  --   RETICCTPCT  --  0.8   Sepsis Labs: Recent Labs  Lab 08/02/23 0257  PROCALCITON <0.10    Recent Results (from the past 240 hours)  Resp panel by RT-PCR (RSV, Flu A&B, Covid) Anterior Nasal Swab     Status: None   Collection Time: 07/30/23  9:26 PM   Specimen: Anterior Nasal Swab  Result Value Ref Range Status   SARS Coronavirus 2 by RT PCR NEGATIVE NEGATIVE Final   Influenza A by PCR NEGATIVE NEGATIVE Final   Influenza B by PCR NEGATIVE NEGATIVE Final    Comment: (NOTE) The Xpert Xpress SARS-CoV-2/FLU/RSV plus assay is intended as an aid in the diagnosis of influenza from Nasopharyngeal swab specimens and should not be used as a sole basis for treatment. Nasal washings and aspirates are unacceptable for Xpert Xpress SARS-CoV-2/FLU/RSV testing.  Fact Sheet for Patients: BloggerCourse.com  Fact Sheet for Healthcare Providers: SeriousBroker.it  This test is not yet approved or cleared by the [place] FDA and has been authorized for detection and/or diagnosis of SARS-CoV-2 by FDA under an Emergency Use Authorization (EUA). This EUA will remain in effect (meaning this test can be used) for the duration of the COVID-19 declaration under Section 564(b)(1) of the Act, 21 U.S.C. section 360bbb-3(b)(1), unless the authorization is terminated or revoked.     Resp Syncytial Virus by PCR NEGATIVE NEGATIVE Final    Comment: (NOTE) Fact Sheet for Patients: BloggerCourse.com  Fact Sheet for Healthcare Providers: SeriousBroker.it  This test is not yet approved or cleared by the [place] FDA and has been authorized for detection and/or diagnosis of SARS-CoV-2 by FDA under an Emergency Use  Authorization (EUA). This EUA will remain in effect (meaning this test can be used) for the duration of the COVID-19 declaration under Section 564(b)(1) of the Act, 21 U.S.C. section 360bbb-3(b)(1), unless the authorization is terminated or revoked.  Performed at [facility], [address], [place], [place] [address]   Resp panel by RT-PCR (RSV, Flu A&B, Covid) Anterior Nasal Swab     Status: None   Collection Time: 08/01/23  4:43 PM   Specimen: Anterior Nasal Swab  Result Value Ref Range Status   SARS Coronavirus 2 by RT PCR NEGATIVE NEGATIVE Final    Comment: (NOTE) SARS-CoV-2 target nucleic acids are NOT DETECTED.  The SARS-CoV-2 RNA is generally detectable in upper respiratory specimens during the acute phase of infection. The lowest concentration of SARS-CoV-2 viral copies this assay can detect is 138 copies/mL. A negative result does not preclude SARS-Cov-2 infection and should not be used as the sole basis for treatment or other patient management decisions. A negative result may occur with  improper specimen collection/handling, submission of specimen other than nasopharyngeal swab, presence of viral mutation(s) within the areas targeted by this assay, and inadequate number of viral copies(<138 copies/mL). A negative result must be combined with clinical observations, patient history, and epidemiological information. The expected result is Negative.  Fact Sheet for Patients:  BloggerCourse.com  Fact Sheet for Healthcare Providers:  SeriousBroker.it  This test is no t yet approved or cleared by the [place] FDA and  has been authorized for detection and/or diagnosis of SARS-CoV-2 by FDA under an Emergency Use Authorization (EUA). This EUA will remain  in effect (meaning this test can be used) for the duration of the COVID-19 declaration under Section 564(b)(1) of the Act, 21 U.S.C.section 360bbb-3(b)(1),  unless the authorization is terminated  or revoked sooner.       Influenza A by PCR NEGATIVE NEGATIVE Final   Influenza B by PCR NEGATIVE NEGATIVE Final    Comment: (NOTE) The Xpert Xpress SARS-CoV-2/FLU/RSV plus assay is intended as an aid in the diagnosis of influenza from Nasopharyngeal swab specimens and should not be used as a sole basis for treatment. Nasal washings and aspirates are unacceptable for Xpert Xpress SARS-CoV-2/FLU/RSV testing.  Fact Sheet for Patients: BloggerCourse.com  Fact Sheet for Healthcare Providers: SeriousBroker.it  This test is not yet approved or cleared by the [place] FDA and has been authorized for detection and/or diagnosis of SARS-CoV-2 by FDA under an Emergency Use Authorization (EUA). This EUA will remain in effect (meaning this test can be used) for the duration of the COVID-19 declaration under Section 564(b)(1) of the Act, 21 U.S.C. section 360bbb-3(b)(1), unless the authorization is terminated or  revoked.     Resp Syncytial Virus by PCR NEGATIVE NEGATIVE Final    Comment: (NOTE) Fact Sheet for Patients: BloggerCourse.com  Fact Sheet for Healthcare Providers: SeriousBroker.it  This test is not yet approved or cleared by the [place] FDA and has been authorized for detection and/or diagnosis of SARS-CoV-2 by FDA under an Emergency Use Authorization (EUA). This EUA will remain in effect (meaning this test can be used) for the duration of the COVID-19 declaration under Section 564(b)(1) of the Act, 21 U.S.C. section 360bbb-3(b)(1), unless the authorization is terminated or revoked.  Performed at [facility], [address], [place], [place] [address]   Culture, blood (routine x 2)     Status: Abnormal   Collection Time: 08/01/23  4:50 PM   Specimen: BLOOD  Result Value Ref Range Status   Specimen  Description   Final    BLOOD LEFT ANTECUBITAL Performed at [facility], [address], [place], [place] [address]    Special Requests   Final    BOTTLES DRAWN AEROBIC AND ANAEROBIC Blood Culture results may not be optimal due to an inadequate volume of blood received in culture bottles Performed at [facility], [address] [place], [place] [address]    Culture  Setup Time   Final    GRAM POSITIVE COCCI ANAEROBIC BOTTLE ONLY CRITICAL RESULT CALLED TO, READ BACK BY AND VERIFIED WITH: PHARMD CHRISTINE SHADE ON 08/02/23 @ 1814 BY DRT GRAM POSITIVE RODS AEROBIC BOTTLE ONLY CRITICAL RESULT CALLED TO, READ BACK BY AND VERIFIED WITH: PHARMD CHRISTINE SHADE ON 08/02/23 @ 1817 BY DRT    Culture (A)  Final    ENTEROCOCCUS FAECALIS CORYNEBACTERIUM STRIATUM Standardized susceptibility testing for this organism is not available. Performed at [facility], [address] [place], [place] [address]    Report Status 08/04/2023 FINAL  Final   Organism ID, Bacteria ENTEROCOCCUS FAECALIS  Final      Susceptibility   Enterococcus faecalis - MIC*    AMPICILLIN <=2 SENSITIVE Sensitive     VANCOMYCIN 1 SENSITIVE Sensitive     GENTAMICIN SYNERGY SENSITIVE Sensitive     * ENTEROCOCCUS FAECALIS  Blood Culture ID Panel (Reflexed)     Status: Abnormal   Collection Time: 08/01/23  4:50 PM  Result Value Ref Range Status   Enterococcus faecalis DETECTED (A) NOT DETECTED Final    Comment: CRITICAL RESULT CALLED TO, READ BACK BY AND VERIFIED WITH: PHARMD CHRISTINE SHADE ON 08/02/23 @ 1814 BY DRT    Enterococcus Faecium NOT DETECTED NOT DETECTED Final   Listeria monocytogenes NOT DETECTED NOT DETECTED Final   Staphylococcus species NOT DETECTED NOT DETECTED Final   Staphylococcus aureus (BCID) NOT DETECTED NOT DETECTED Final   Staphylococcus epidermidis NOT DETECTED NOT DETECTED Final   Staphylococcus lugdunensis NOT DETECTED NOT DETECTED Final    Streptococcus species NOT DETECTED NOT DETECTED Final   Streptococcus agalactiae NOT DETECTED NOT DETECTED Final   Streptococcus pneumoniae NOT DETECTED NOT DETECTED Final   Streptococcus pyogenes NOT DETECTED NOT DETECTED Final   A.calcoaceticus-baumannii NOT DETECTED NOT DETECTED Final   Bacteroides fragilis NOT DETECTED NOT DETECTED Final   Enterobacterales NOT DETECTED NOT DETECTED Final   Enterobacter cloacae complex NOT DETECTED NOT DETECTED Final   Escherichia coli NOT DETECTED NOT DETECTED Final   Klebsiella aerogenes NOT DETECTED NOT DETECTED Final   Klebsiella oxytoca NOT DETECTED NOT DETECTED Final   Klebsiella pneumoniae NOT DETECTED NOT DETECTED Final  Proteus species NOT DETECTED NOT DETECTED Final   Salmonella species NOT DETECTED NOT DETECTED Final   Serratia marcescens NOT DETECTED NOT DETECTED Final   Haemophilus influenzae NOT DETECTED NOT DETECTED Final   Neisseria meningitidis NOT DETECTED NOT DETECTED Final   Pseudomonas aeruginosa NOT DETECTED NOT DETECTED Final   Stenotrophomonas maltophilia NOT DETECTED NOT DETECTED Final   Candida albicans NOT DETECTED NOT DETECTED Final   Candida auris NOT DETECTED NOT DETECTED Final   Candida glabrata NOT DETECTED NOT DETECTED Final   Candida krusei NOT DETECTED NOT DETECTED Final   Candida parapsilosis NOT DETECTED NOT DETECTED Final   Candida tropicalis NOT DETECTED NOT DETECTED Final   Cryptococcus neoformans/gattii NOT DETECTED NOT DETECTED Final   Vancomycin resistance NOT DETECTED NOT DETECTED Final    Comment: Performed at [facility], [address], [place], [place] [address]  Urine Culture     Status: Abnormal   Collection Time: 08/01/23  5:02 PM   Specimen: Urine, Random  Result Value Ref Range Status   Specimen Description   Final    URINE, RANDOM Performed at [facility], [address], [place], [place] [address]    Special Requests   Final    NONE Reflexed from  [id number] Performed at [facility], [address] [place], [place] [address]    Culture (A)  Final    <10,000 COLONIES/mL INSIGNIFICANT GROWTH Performed at [facility], [address], [place], [place] [address]    Report Status 08/03/2023 FINAL  Final  Culture, blood (routine x 2)     Status: None   Collection Time: 08/01/23  5:09 PM   Specimen: BLOOD  Result Value Ref Range Status   Specimen Description   Final    BLOOD BLOOD RIGHT HAND Performed at [facility], [address], [place], [place] [address]    Special Requests   Final    BOTTLES DRAWN AEROBIC ONLY Blood Culture results may not be optimal due to an inadequate volume of blood received in culture bottles Performed at [facility], [address] [place], [place] [address]    Culture   Final    NO GROWTH 5 DAYS Performed at [facility], [address] [place], [place] [address]    Report Status 08/06/2023 FINAL  Final  MRSA Next Gen by PCR, Nasal     Status: None   Collection Time: 08/01/23  9:15 PM   Specimen: Nasal Mucosa; Nasal Swab  Result Value Ref Range Status   MRSA by PCR Next Gen NOT DETECTED NOT DETECTED Final    Comment: (NOTE) The GeneXpert MRSA Assay (FDA approved for NASAL specimens only), is one component of a comprehensive MRSA colonization surveillance program. It is not intended to diagnose MRSA infection nor to guide or monitor treatment for MRSA infections. Test performance is not FDA approved in patients less than 35 years old. Performed at [facility], [address] [place], [place] [address]   Culture, blood (Routine X 2) w Reflex to ID Panel     Status: None   Collection Time: 08/03/23  3:03 AM   Specimen: BLOOD  Result Value Ref Range Status   Specimen Description   Final    BLOOD BLOOD RIGHT ARM Performed at [facility], [address] [place], [place] [address]     Special Requests   Final    BOTTLES DRAWN AEROBIC AND  ANAEROBIC Blood Culture adequate volume Performed at [facility], [address] [place], [place] [address]    Culture   Final    NO GROWTH 5 DAYS Performed at [facility], [address], [place], [place] [address]    Report Status 08/08/2023 FINAL  Final  Culture, blood (Routine X 2) w Reflex to ID Panel     Status: None   Collection Time: 08/03/23  5:49 AM   Specimen: BLOOD RIGHT ARM  Result Value Ref Range Status   Specimen Description   Final    BLOOD RIGHT ARM Performed at [facility], [address], [place], [place] [address]    Special Requests   Final    BOTTLES DRAWN AEROBIC AND ANAEROBIC Blood Culture results may not be optimal due to an inadequate volume of blood received in culture bottles Performed at [facility], [address], [place], [place] [address]    Culture   Final    NO GROWTH 5 DAYS Performed at [facility], [address] [place], [place] [address]    Report Status 08/08/2023 FINAL  Final    Radiology Studies: DG CHEST PORT 1 VIEW Result Date: 08/08/2023 CLINICAL DATA:  Shortness of breath EXAM: PORTABLE CHEST 1 VIEW COMPARISON:  08/07/2023 FINDINGS: Chronic cardiomegaly. Opacified retrocardiac lung and hazy density at the right base, unchanged. No new opacity, pulmonary edema, or pneumothorax. Right PICC with tip at the upper right atrium. Right apical opacity with adjacent fiducial markers, somewhat obscured by rotation. Extensive artifact from EKG leads. IMPRESSION: Unchanged left more than right lower chest opacification correlating with pleural fluid and atelectasis by most recent CT. Electronically Signed   By: Tiburcio Pea M.D.   On: 08/08/2023 08:50   DG CHEST PORT 1 VIEW Result Date: 08/07/2023 CLINICAL DATA:  Shortness of breath. EXAM: PORTABLE CHEST 1 VIEW COMPARISON:  Chest radiograph dated 08/06/2023. FINDINGS: The heart is  borderline enlarged. Vascular calcifications are seen in the aortic arch. Opacity of the left lung base likely represents a moderate pleural effusion with associated atelectasis/airspace disease. There is likely a trace right pleural effusion with associated atelectasis. No pneumothorax on either side. A right upper extremity peripherally inserted central venous catheter tip overlies the superior cavoatrial junction. Degenerative changes are seen in the spine. IMPRESSION: Moderate left and trace right pleural effusions with associated atelectasis/airspace disease. Electronically Signed   By: Romona Curls M.D.   On: 08/07/2023 10:08   [place] EKG SITE RITE Result Date: 08/06/2023 If Site Rite image not attached, placement could not be confirmed due to current cardiac rhythm.  Scheduled Meds:  amiodarone  200 mg Oral Daily   amitriptyline  50 mg Oral QHS   Chlorhexidine Gluconate Cloth  6 each Topical Daily   digoxin  0.125 mg Oral Daily   docusate sodium  100 mg Oral BID   Gerhardt's butt cream   Topical Daily   hydrocortisone  25 mg Rectal BID   levothyroxine  88 mcg Oral QAC breakfast   midodrine  10 mg Oral TID WC   mometasone-formoterol  2 puff Inhalation BID   multivitamin with minerals  1 tablet Oral Daily   pantoprazole (PROTONIX) IV  40 mg Intravenous Q12H   polyethylene glycol  17 g Oral BID   senna-docusate  1 tablet Oral BID   sodium chloride flush  10-40 mL Intracatheter Q12H   Continuous Infusions:  ampicillin (OMNIPEN) IV Stopped (08/08/23 1615)  LOS: 7 days   Marguerita Merles, DO [facility] Available via Epic secure chat 7am-7pm After these hours, please refer to coverage provider listed on [email or web address] 08/08/2023, 8:18 PM

## 2023-08-08 NOTE — Progress Notes (Signed)
Occupational Therapy Treatment Patient Details Name: Denise Mckenzie MRN: [id number] DOB: 01-03-1946 Today's Date: 08/08/2023   History of present illness This is a 77 year old female presented from [facility] with shornesst of breath, wheezing and altered. Found to have acute on chronic respiratory failure with hypercapnia and HCAP.  PHMx: CAD ( h/o CABG 45), COPD, HTN, HLD, hypothyroidism, T2DM, chronic respiratory failure 3L,  h/o breast and lung cancer.  Patient recently admitted 11/25 - 12/6 with anasarca, CHF exacerbation, A-fib with RVR.   OT comments  Patient was noted to have weakness in BUE with patient unable to move yellow theraband to participate in exercises with attempts made to use it. Patient was TD for rolling in bed to change linens with weeping noted in BLE/R side. Patient was educated on pillow placement to support offloading and benefits of air mattress. Patient verbalized understanding. Patient will benefit from continued inpatient follow up therapy, <3 hours/day.       If plan is discharge home, recommend the following:  Two people to help with walking and/or transfers;Two people to help with bathing/dressing/bathroom;Help with stairs or ramp for entrance;Assist for transportation   Equipment Recommendations  None recommended by OT       Precautions / Restrictions Precautions Precautions: Fall Precaution Comments: non amb > 3 weeks Restrictions Weight Bearing Restrictions Per Provider Order: No              ADL either performed or assessed with clinical judgement   ADL Overall ADL's : Needs assistance/impaired Eating/Feeding: Set up;Bed level Eating/Feeding Details (indicate cue type and reason): patient with son feeding her jello. patient and son were educated on importance of patient feeding herself. patietns son reported jello was challanging with BUE tremors at rest and with movement. patient inciated she could hold spoon. trialed red foam to give  patient better grip on spoon to advance things from plate to mouth. patient reported "it does not make a difference"              Cognition Arousal: Alert Behavior During Therapy: Flat affect Overall Cognitive Status: Difficult to assess           General Comments: patient able to follow commands with increased time. woman of few words. son and friend present for some of session.        Exercises Other Exercises Other Exercises: patient was educated on using stress ball to keep hands moving during the day. patient demonstreted minimal ability to compress medium stress ball but is able to pass it back and forth between BUE. Other Exercises: patient attempted to use yellow theraband for external rotation movements of shoulder. patient minimally able to move band to complete task. patient was educated on importance of AROM of BUE and BLE at bed level with family educated on the same.            Pertinent Vitals/ Pain       Pain Assessment Pain Assessment: Faces Faces Pain Scale: Hurts little more Pain Location: buttocks Pain Descriptors / Indicators: Sore, Grimacing Pain Intervention(s): Limited activity within patient's tolerance, Monitored during session, Repositioned         Frequency  Min 1X/week        Progress Toward Goals  OT Goals(current goals can now be found in the care plan section)  Progress towards OT goals: OT to reassess next treatment     Plan         AM-PAC OT "6 Clicks" Daily Activity  Outcome Measure   Help from another person eating meals?: A Little Help from another person taking care of personal grooming?: A Lot Help from another person toileting, which includes using toliet, bedpan, or urinal?: Total Help from another person bathing (including washing, rinsing, drying)?: A Lot Help from another person to put on and taking off regular upper body clothing?: A Lot Help from another person to put on and taking off regular lower body  clothing?: Total 6 Click Score: 11    End of Session Equipment Utilized During Treatment: Oxygen  OT Visit Diagnosis: Unsteadiness on feet (R26.81);Other abnormalities of gait and mobility (R26.89);Muscle weakness (generalized) (M62.81);Dizziness and giddiness (R42);Pain   Activity Tolerance Patient limited by fatigue (reported dizziness with moving bed to roll)   Patient Left in bed;with call bell/phone within reach;with bed alarm set;with family/visitor present   Nurse Communication Other (comment) (in room to help with linen changes with weeping noted.)        Time: 0102-7253 OT Time Calculation (min): 26 min  Charges: OT General Charges $OT Visit: 1 Visit OT Treatments $Therapeutic Activity: 8-22 mins  Rosalio Loud, MS Acute Rehabilitation Department Office# [phone]   Selinda Flavin 08/08/2023, 2:14 PM

## 2023-08-08 NOTE — Plan of Care (Signed)
  Problem: Education: Goal: Individualized Educational Video(s) Outcome: Not Progressing   Problem: Coping: Goal: Ability to adjust to condition or change in health will improve Outcome: Not Progressing   Problem: Fluid Volume: Goal: Ability to maintain a balanced intake and output will improve Outcome: Not Progressing   Problem: Health Behavior/Discharge Planning: Goal: Ability to identify and utilize available resources and services will improve Outcome: Not Progressing

## 2023-08-08 NOTE — Progress Notes (Signed)
 Bipap on standby(V60)

## 2023-08-08 NOTE — Plan of Care (Signed)

## 2023-08-09 ENCOUNTER — Inpatient Hospital Stay (HOSPITAL_COMMUNITY): Payer: Medicare Other

## 2023-08-09 DIAGNOSIS — J9622 Acute and chronic respiratory failure with hypercapnia: Secondary | ICD-10-CM | POA: Diagnosis not present

## 2023-08-09 DIAGNOSIS — I2510 Atherosclerotic heart disease of native coronary artery without angina pectoris: Secondary | ICD-10-CM

## 2023-08-09 DIAGNOSIS — I5032 Chronic diastolic (congestive) heart failure: Secondary | ICD-10-CM | POA: Diagnosis not present

## 2023-08-09 DIAGNOSIS — J410 Simple chronic bronchitis: Secondary | ICD-10-CM | POA: Diagnosis not present

## 2023-08-09 DIAGNOSIS — R7881 Bacteremia: Secondary | ICD-10-CM | POA: Diagnosis not present

## 2023-08-09 LAB — GLUCOSE, CAPILLARY
Glucose-Capillary: 121 mg/dL — ABNORMAL HIGH (ref 70–99)
Glucose-Capillary: 74 mg/dL (ref 70–99)
Glucose-Capillary: 108 mg/dL — ABNORMAL HIGH (ref 70–99)
Glucose-Capillary: 125 mg/dL — ABNORMAL HIGH (ref 70–99)
Glucose-Capillary: 123 mg/dL — ABNORMAL HIGH (ref 70–99)
Glucose-Capillary: 145 mg/dL — ABNORMAL HIGH (ref 70–99)

## 2023-08-09 LAB — BASIC METABOLIC PANEL
Anion gap: 8 (ref 5–15)
BUN: 23 mg/dL (ref 8–23)
CO2: 35 mmol/L — ABNORMAL HIGH (ref 22–32)
Calcium: 7.4 mg/dL — ABNORMAL LOW (ref 8.9–10.3)
Chloride: 95 mmol/L — ABNORMAL LOW (ref 98–111)
Creatinine, Ser: 0.42 mg/dL — ABNORMAL LOW (ref 0.44–1.00)
GFR, Estimated: >60 mL/min (ref >60)
Glucose, Bld: 85 mg/dL (ref 70–99)
Potassium: 4 mmol/L (ref 3.5–5.1)
Sodium: 138 mmol/L (ref 135–145)

## 2023-08-09 LAB — HEMOGLOBIN AND HEMATOCRIT, BLOOD
HCT: 28.3 % — ABNORMAL LOW (ref 36.0–46.0)
HCT: 29.8 % — ABNORMAL LOW (ref 36.0–46.0)
Hemoglobin: 8.9 g/dL — ABNORMAL LOW (ref 12.0–15.0)
Hemoglobin: 9 g/dL — ABNORMAL LOW (ref 12.0–15.0)

## 2023-08-09 LAB — CBC
HCT: 29.1 % — ABNORMAL LOW (ref 36.0–46.0)
Hemoglobin: 8.7 g/dL — ABNORMAL LOW (ref 12.0–15.0)
MCH: 28.7 pg (ref 26.0–34.0)
MCHC: 29.9 g/dL — ABNORMAL LOW (ref 30.0–36.0)
MCV: 96 fL (ref 80.0–100.0)
Platelets: 107 10*3/uL — ABNORMAL LOW (ref 150–400)
RBC: 3.03 MIL/uL — ABNORMAL LOW (ref 3.87–5.11)
RDW: 16.7 % — ABNORMAL HIGH (ref 11.5–15.5)
WBC: 13.4 10*3/uL — ABNORMAL HIGH (ref 4.0–10.5)
nRBC: 0 % (ref 0.0–0.2)

## 2023-08-09 LAB — D-DIMER, QUANTITATIVE: D-Dimer, Quant: 0.92 ug{FEU}/mL — ABNORMAL HIGH (ref 0.00–0.50)

## 2023-08-09 LAB — BRAIN NATRIURETIC PEPTIDE: B Natriuretic Peptide: 1393.1 pg/mL — ABNORMAL HIGH (ref 0.0–100.0)

## 2023-08-09 LAB — MAGNESIUM: Magnesium: 2.1 mg/dL (ref 1.7–2.4)

## 2023-08-09 MED ORDER — MORPHINE SULFATE (PF) 2 MG/ML IV SOLN: 1.0000 mg | Freq: Once | INTRAVENOUS | Status: DC

## 2023-08-09 MED ORDER — TORSEMIDE 20 MG PO TABS
20.0000 mg | ORAL_TABLET | Freq: Every day | ORAL | Status: DC
  Administered 2023-08-09: 20 mg via ORAL
  Filled 2023-08-09: qty 1

## 2023-08-09 MED ORDER — ALBUMIN HUMAN 25 % IV SOLN
25.0000 g | Freq: Three times a day (TID) | INTRAVENOUS | Status: AC
  Administered 2023-08-09 – 2023-08-10 (×3): 25 g via INTRAVENOUS
  Filled 2023-08-09 (×3): qty 100

## 2023-08-09 MED ORDER — FUROSEMIDE 10 MG/ML IJ SOLN
40.0000 mg | Freq: Once | INTRAMUSCULAR | Status: AC
Start: 2023-08-09 — End: 2023-08-09
  Administered 2023-08-09: 40 mg via INTRAVENOUS
  Filled 2023-08-09: qty 4

## 2023-08-09 NOTE — Plan of Care (Signed)
  Problem: Metabolic: Goal: Ability to maintain appropriate glucose levels will improve Outcome: Progressing   Problem: Tissue Perfusion: Goal: Adequacy of tissue perfusion will improve Outcome: Progressing   

## 2023-08-09 NOTE — Progress Notes (Signed)
   08/09/23 1946  BiPAP/CPAP/SIPAP  BiPAP/CPAP/SIPAP Pt Type Adult  BiPAP/CPAP/SIPAP V60  Mask Type Full face mask  Mask Size Small  Set Rate 12 breaths/min  Respiratory Rate 14 breaths/min  IPAP 16 cmH20  EPAP 5 cmH2O  FiO2 (%) 50 %  Minute Ventilation 6.3  Leak 10  Peak Inspiratory Pressure (PIP) 16  Tidal Volume (Vt) 477  Patient Home Equipment No  Auto Titrate No  Press High Alarm 35 cmH2O  Press Low Alarm 5 cmH2O

## 2023-08-09 NOTE — Progress Notes (Signed)
Speech Language Pathology Treatment: Dysphagia  Patient Details Name: DEMETRIUS THEILEN MRN: [id number] DOB: 06/18/1946 Today's Date: 08/09/2023 Time: 1150-1205 SLP Time Calculation (min) (ACUTE ONLY): 15 min  Assessment / Plan / Recommendation Clinical Impression  Patient seen by SLP for skilled treatment focused on dysphagia goals. She was awake and alert in bed. RN entered room and informed SLP that patient has been having difficulty swallowing pills and as she is on a fluid restriction, he isn't able to give her large amounts of liquids to help transit pills. SLP reviewed MBS results from last week which showed 13mm barium tablet briefly lodging in vallecular sinus and then again in upper thoracic portion of esophagus. SLP recommended to try pills crushed in puree. Patient herself did not want to eat any of the foods on her lunch tray but was receptive to drinking iced tea and eating magic cup supplement 'ice cream'. Aside from suspected delayed swallow initiation, no overt s/s aspiration observed. SLP will f/u at least one more time to ensure toleration.    HPI HPI: Patient is a 77 y.o. female who was assessed via BSE and MBS this admission on 12/17 secondary to patient c/o difficult with swallowing pills and RN observing patient to have instances of coughing when drinking liquids. MBS showed no penetration or aspiration, no residue. Swallow initiation at pyriform sinsues. Barium tablet lodged in vallecuale and thoracis esopahgus, passed with puree.  SLP signed off. Reordered on 12/20 due to RN observing consistent and significant coughing with liquids with concern for aspiration though most recent image reports "Decreased bibasilar opacities suggesting improving edema or  pneumonia".  Pt previously admitted 11/25-12/6/24 with anasarca, CHF exacerbation, a-fib with RVR. She was discharged to [facility]. She presented to hospital on 12/15 with SOB, wheezing and altered.  CT chest suggestive of PNA.  She was placed on BiPAP. PMH: CAD, h/o CABG 1996, COPD, HTN, HLD, hypothyroidism, DM-2, chronic respiratory failure on 3L, h/o breast and lung cancer.      SLP Plan  Continue with current plan of care      Recommendations for follow up therapy are one component of a multi-disciplinary discharge planning process, led by the attending physician.  Recommendations may be updated based on patient status, additional functional criteria and insurance authorization.    Recommendations  Diet recommendations: Regular;Thin liquid Liquids provided via: Cup;Straw Medication Administration: Crushed with puree Supervision: Patient able to self feed;Full supervision/cueing for compensatory strategies Compensations: Slow rate;Small sips/bites Postural Changes and/or Swallow Maneuvers: Seated upright 90 degrees                  Oral care BID   Intermittent Supervision/Assistance Dysphagia, oropharyngeal phase (R13.12)     Continue with current plan of care     Angela Nevin, MA, CCC-SLP Speech Therapy

## 2023-08-09 NOTE — Progress Notes (Signed)
[facility]                                                                              Denise Mckenzie, is a 77 y.o. female, DOB - [birth year], RUE:[id number] Admit date - 08/01/2023    Outpatient Primary MD for the patient is Denise Apley, MD  LOS - 8  days  Chief Complaint  Patient presents with   Shortness of Breath   Altered Mental Status       Brief summary   Patient is a 77 year old female with CAD, CABG in 2, COPD, HTN, HLP, hypothyroidism, diabetes mellitus, chronic respiratory failure on 3 L, O2 BNC, history of breast CVA, lung CA had presented with altered mental status and shortness of breath.   Patient had been recently admitted 11/25 to 12/6 with anasarca, CHF exacerbation, A-fib with RVR.  Patient was treated with diuresis, underwent TEE/DCCV for A-fib.  She was discharged to SNF, presented back with wheezing, shortness of breath and altered mental status.  Initially noted to be in hypercapnic respiratory failure and was placed on BiPAP. CT suggested possible pneumonia and was placed on vancomycin and cefepime for HCAP.  Blood cultures grew GPC/Enterococcus faecalis, transition to Unasyn per ID recommendations.  Cardiology was also consulted.  2D echo showed preserved EF without vegetation. ID recommended ampicillin IV every 24 hours with end date of 08/16/2023.  On 12/20, patient noted to be hypotensive, torsemide, Cardizem held.  FOBT positive.  GI was consulted.  Patient continued to have more GI bleeding. Dr Marland Mcalpine discussed with gastroenterology, Dr. Dulce Sellar recommend CT angio of abdomen and pelvis, no plan for colonoscopy.  Assessment & Plan    Principal Problem:   Acute on chronic respiratory failure with hypercapnia (HCC), HCAP Enterococcus faecalis bacteremia -Initially placed on BiPAP, weaned off. -Currently O2 sats 93 to 99% on 6 L HFNC, wean as tolerated -Blood cultures showed GPC/Enterococcus faecalis. -2D echo showed preserved EF 65%,  enlarged RV without vegetation. -ID was consulted, recommended ampicillin IV 2 g every 4 hours, stop date on 08/16/2023 -COVID, flu, RSV, urine strep antigen negative -Continue bronchodilators, incentive spirometry, flutter valve  Active Problems: FOBT positive, lower GI bleed, ABLA -Anemia panel showed iron 44, UIBC 88, TIBC 132, ferritin 185, folate 6.5, B12 350 -Continue to monitor H&H. -Previous attending, Dr. Marland Mcalpine discussed with GI, Dr. Dulce Sellar, recommended CTA abdomen pelvis for active GI bleeding -H&H improving, continue to monitor closely -Continue to hold anticoagulation for now.  Before starting eliquis, will start heparin drip challenge.  Persistent hypotension - Likely in the setting of infection, antihypertensives, diuretics -BP currently stable and improving -Currently on midodrine 10 mg 3 times daily, continue  Paroxysmal atrial fibrillation status post cardioversion on 07/06/2023 -Currently on amiodarone 200 mg daily, digoxin 0.125 mg daily, as needed metoprolol -HR controlled, Cardizem currently on hold -Heparin drip currently held due to GI bleeding -Seen by cardiology again on 12/20, recommended continue amiodarone and outpatient DCCV once medically stable.  Follow-up with cardiology scheduled on 09/01/22   Acute on chronic HFpEF, pulmonary hypertension, RV failure/dilation History of CAD, CABG 1996 -Overall improving, off the BiPAP, still on 6  L HFNC, wean as tolerated -Outpatient sleep study -Currently torsemide on hold due to hypotension (outpatient on 40 mg twice daily) -Strict I's and O's and daily weights, positive balance of 3.3 L -Will place on torsemide 20 mg daily  History of right upper lobe lung cancer History of breast cancer -Both in remission  Acute Urinary Retention -Has been wheelchair-bound basically since a few months ago -Continue Foley catheter   Hyperlipidemia -Will resume statin at discharge, LFTs normalized    Hypothyroidism -Continue Synthroid  Thrombocytopenia, acute -Likely due to bleeding and acute illness - PLT Counts improving  Pressure injury documentation Sacrum left deep tissue pressure injury, not POA -Wound care per nursing   Nutrition Problem: Inadequate oral intake Etiology: decreased appetite, other (see comment) (dislike of available food) Signs/Symptoms: energy intake < or equal to 50% for > or equal to 5 days Interventions: Refer to RD note for recommendations, Magic cup, MVI, Liberalize Diet  Estimated body mass index is 28.19 kg/m as calculated from the following:   Height as of this encounter: 5\' 2"  (1.575 m).   Weight as of this encounter: 69.9 kg.  Code Status: Full CODE STATUS DVT Prophylaxis:  SCDs   Level of Care: Level of care: Stepdown Family Communication: Updated patient Disposition Plan:      Remains inpatient appropriate:      Procedures:    Consultants:   Infectious diseases Cardiology  Antimicrobials:   Anti-infectives (From admission, onward)    Start     Dose/Rate Route Frequency Ordered Stop   08/04/23 1200  ampicillin (OMNIPEN) 2 g in sodium chloride 0.9 % 100 mL IVPB        2 g 300 mL/hr over 20 Minutes Intravenous Every 4 hours 08/04/23 1022     08/03/23 0600  Ampicillin-Sulbactam (UNASYN) 3 g in sodium chloride 0.9 % 100 mL IVPB  Status:  Discontinued        3 g 200 mL/hr over 30 Minutes Intravenous Every 6 hours 08/02/23 2139 08/04/23 1022   08/02/23 1900  vancomycin (VANCOCIN) IVPB 1000 mg/200 mL premix  Status:  Discontinued        1,000 mg 200 mL/hr over 60 Minutes Intravenous Every 24 hours 08/01/23 2028 08/02/23 1325   08/02/23 0600  piperacillin-tazobactam (ZOSYN) IVPB 3.375 g  Status:  Discontinued        3.375 g 12.5 mL/hr over 240 Minutes Intravenous Every 8 hours 08/01/23 2028 08/02/23 2139   08/01/23 1830  Vancomycin (VANCOCIN) 1,500 mg in sodium chloride 0.9 % 500 mL IVPB        1,500 mg 250 mL/hr over 120  Minutes Intravenous  Once 08/01/23 1743 08/02/23 0848   08/01/23 1800  ceFEPIme (MAXIPIME) 2 g in sodium chloride 0.9 % 100 mL IVPB        2 g 200 mL/hr over 30 Minutes Intravenous  Once 08/01/23 1743 08/01/23 1849          Medications  amiodarone  200 mg Oral Daily   amitriptyline  50 mg Oral QHS   Chlorhexidine Gluconate Cloth  6 each Topical Daily   digoxin  0.125 mg Oral Daily   docusate sodium  100 mg Oral BID   Gerhardt's butt cream   Topical Daily   hydrocortisone  25 mg Rectal BID   levothyroxine  88 mcg Oral QAC breakfast   midodrine  10 mg Oral TID WC   mometasone-formoterol  2 puff Inhalation BID   multivitamin with minerals  1 tablet Oral  Daily   pantoprazole (PROTONIX) IV  40 mg Intravenous Q12H   polyethylene glycol  17 g Oral BID   senna-docusate  1 tablet Oral BID   sodium chloride flush  10-40 mL Intracatheter Q12H      Subjective:   Denise Mckenzie was seen and examined today.  No acute complaints, no obvious GI bleeding.  Tired and fatigued but no acute shortness of breath, lightheadedness, dizziness, chest pain, nausea or vomiting.  No fevers BP stable. No acute events overnight.    Objective:   Vitals:   08/09/23 0500 08/09/23 0600 08/09/23 0700 08/09/23 0800  BP: (!) 118/45 (!) 120/55    Pulse: 82 87 79   Resp: 19 (!) 29 19   Temp:    (!) 97.4 F (36.3 C)  TempSrc:    Oral  SpO2: 99% 94% 93%   Weight:      Height:        Intake/Output Summary (Last 24 hours) at 08/09/2023 0953 Last data filed at 08/09/2023 0846 Gross per 24 hour  Intake 2098.04 ml  Output 575 ml  Net 1523.04 ml     Wt Readings from Last 3 Encounters:  08/08/23 69.9 kg  07/30/23 69.5 kg  07/25/23 69.4 kg     Exam General: Alert and oriented x 3, NAD, ill-appearing Cardiovascular: S1 S2 auscultated,  RRR Respiratory: Diminished breath sounds at the bases Gastrointestinal: Soft, nontender, nondistended, + bowel sounds Ext: unna boots on Neuro: no new  deficits Psych: Normal affect     Data Reviewed:  I have personally reviewed following labs    CBC Lab Results  Component Value Date   WBC 13.4 (H) 08/09/2023   RBC 3.03 (L) 08/09/2023   HGB 9.0 (L) 08/09/2023   HCT 29.8 (L) 08/09/2023   MCV 96.0 08/09/2023   MCH 28.7 08/09/2023   PLT 107 (L) 08/09/2023   MCHC 29.9 (L) 08/09/2023   RDW 16.7 (H) 08/09/2023   LYMPHSABS 0.3 (L) 08/08/2023   MONOABS 0.6 08/08/2023   EOSABS 0.2 08/08/2023   BASOSABS 0.0 08/08/2023     Last metabolic panel Lab Results  Component Value Date   NA 138 08/09/2023   K 4.0 08/09/2023   CL 95 (L) 08/09/2023   CO2 35 (H) 08/09/2023   BUN 23 08/09/2023   CREATININE 0.42 (L) 08/09/2023   GLUCOSE 85 08/09/2023   GFRNONAA >60 08/09/2023   GFRAA >60 07/06/2019   CALCIUM 7.4 (L) 08/09/2023   PHOS 2.4 (L) 08/08/2023   PROT 3.9 (L) 08/08/2023   ALBUMIN 2.0 (L) 08/08/2023   LABGLOB 2.8 04/16/2021   AGRATIO 1.5 04/16/2021   BILITOT 0.8 08/08/2023   ALKPHOS 55 08/08/2023   AST 25 08/08/2023   ALT 34 08/08/2023   ANIONGAP 8 08/09/2023    CBG (last 3)  Recent Labs    08/08/23 2348 08/09/23 0355 08/09/23 0815  GLUCAP 91 121* 74      Coagulation Profile: No results for input(s): "INR", "PROTIME" in the last 168 hours.   Radiology Studies: I have personally reviewed the imaging studies  DG CHEST PORT 1 VIEW Result Date: 08/08/2023 CLINICAL DATA:  Shortness of breath EXAM: PORTABLE CHEST 1 VIEW COMPARISON:  08/07/2023 FINDINGS: Chronic cardiomegaly. Opacified retrocardiac lung and hazy density at the right base, unchanged. No new opacity, pulmonary edema, or pneumothorax. Right PICC with tip at the upper right atrium. Right apical opacity with adjacent fiducial markers, somewhat obscured by rotation. Extensive artifact from EKG leads. IMPRESSION: Unchanged left more  than right lower chest opacification correlating with pleural fluid and atelectasis by most recent CT. Electronically Signed   By:  Tiburcio Pea M.D.   On: 08/08/2023 08:50       Maxine Huynh M.D. [facility] 08/09/2023, 9:53 AM  Available via Epic secure chat 7am-7pm After 7 pm, please refer to night coverage provider listed on amion.

## 2023-08-10 ENCOUNTER — Inpatient Hospital Stay (HOSPITAL_COMMUNITY): Payer: Medicare Other

## 2023-08-10 DIAGNOSIS — R7881 Bacteremia: Secondary | ICD-10-CM | POA: Diagnosis not present

## 2023-08-10 DIAGNOSIS — I5032 Chronic diastolic (congestive) heart failure: Secondary | ICD-10-CM | POA: Diagnosis not present

## 2023-08-10 DIAGNOSIS — J410 Simple chronic bronchitis: Secondary | ICD-10-CM | POA: Diagnosis not present

## 2023-08-10 DIAGNOSIS — I5033 Acute on chronic diastolic (congestive) heart failure: Secondary | ICD-10-CM | POA: Diagnosis not present

## 2023-08-10 DIAGNOSIS — J9622 Acute and chronic respiratory failure with hypercapnia: Secondary | ICD-10-CM | POA: Diagnosis not present

## 2023-08-10 DIAGNOSIS — I4892 Unspecified atrial flutter: Secondary | ICD-10-CM | POA: Diagnosis not present

## 2023-08-10 LAB — CBC
HCT: 19.9 % — ABNORMAL LOW (ref 36.0–46.0)
HCT: 25.2 % — ABNORMAL LOW (ref 36.0–46.0)
Hemoglobin: 6 g/dL — CL (ref 12.0–15.0)
Hemoglobin: 7.8 g/dL — ABNORMAL LOW (ref 12.0–15.0)
MCH: 28.4 pg (ref 26.0–34.0)
MCH: 29.8 pg (ref 26.0–34.0)
MCHC: 30.2 g/dL (ref 30.0–36.0)
MCHC: 31 g/dL (ref 30.0–36.0)
MCV: 94.3 fL (ref 80.0–100.0)
MCV: 96.2 fL (ref 80.0–100.0)
Platelets: 83 10*3/uL — ABNORMAL LOW (ref 150–400)
Platelets: 105 10*3/uL — ABNORMAL LOW (ref 150–400)
RBC: 2.11 MIL/uL — ABNORMAL LOW (ref 3.87–5.11)
RBC: 2.62 MIL/uL — ABNORMAL LOW (ref 3.87–5.11)
RDW: 17 % — ABNORMAL HIGH (ref 11.5–15.5)
RDW: 17 % — ABNORMAL HIGH (ref 11.5–15.5)
WBC: 10.5 10*3/uL (ref 4.0–10.5)
WBC: 13.6 10*3/uL — ABNORMAL HIGH (ref 4.0–10.5)
nRBC: 0 % (ref 0.0–0.2)
nRBC: 0 % (ref 0.0–0.2)

## 2023-08-10 LAB — BASIC METABOLIC PANEL
Anion gap: 12 (ref 5–15)
Anion gap: 7 (ref 5–15)
BUN: 19 mg/dL (ref 8–23)
BUN: 23 mg/dL (ref 8–23)
CO2: 32 mmol/L (ref 22–32)
CO2: 37 mmol/L — ABNORMAL HIGH (ref 22–32)
Calcium: 5.9 mg/dL — CL (ref 8.9–10.3)
Calcium: 7.6 mg/dL — ABNORMAL LOW (ref 8.9–10.3)
Chloride: 104 mmol/L (ref 98–111)
Chloride: 98 mmol/L (ref 98–111)
Creatinine, Ser: 0.44 mg/dL (ref 0.44–1.00)
Creatinine, Ser: 0.46 mg/dL (ref 0.44–1.00)
GFR, Estimated: >60 mL/min (ref >60)
GFR, Estimated: >60 mL/min (ref >60)
Glucose, Bld: 81 mg/dL (ref 70–99)
Glucose, Bld: 93 mg/dL (ref 70–99)
Potassium: 2.8 mmol/L — ABNORMAL LOW (ref 3.5–5.1)
Potassium: 3.6 mmol/L (ref 3.5–5.1)
Sodium: 148 mmol/L — ABNORMAL HIGH (ref 135–145)
Sodium: 142 mmol/L (ref 135–145)

## 2023-08-10 LAB — GLUCOSE, CAPILLARY
Glucose-Capillary: 111 mg/dL — ABNORMAL HIGH (ref 70–99)
Glucose-Capillary: 112 mg/dL — ABNORMAL HIGH (ref 70–99)
Glucose-Capillary: 124 mg/dL — ABNORMAL HIGH (ref 70–99)
Glucose-Capillary: 82 mg/dL (ref 70–99)
Glucose-Capillary: 93 mg/dL (ref 70–99)
Glucose-Capillary: 96 mg/dL (ref 70–99)

## 2023-08-10 MED ORDER — SODIUM CHLORIDE (PF) 0.9 % IJ SOLN
INTRAMUSCULAR | Status: AC
  Filled 2023-08-10: qty 50

## 2023-08-10 MED ORDER — IOHEXOL 350 MG/ML SOLN
100.0000 mL | Freq: Once | INTRAVENOUS | Status: AC | PRN
  Administered 2023-08-10: 100 mL via INTRAVENOUS

## 2023-08-10 MED ORDER — FUROSEMIDE 10 MG/ML IJ SOLN
60.0000 mg | Freq: Two times a day (BID) | INTRAMUSCULAR | Status: DC
  Administered 2023-08-10 – 2023-08-11 (×3): 60 mg via INTRAVENOUS
  Filled 2023-08-10 (×3): qty 6

## 2023-08-10 NOTE — Plan of Care (Signed)
  Problem: Metabolic: Goal: Ability to maintain appropriate glucose levels will improve Outcome: Progressing   Problem: Clinical Measurements: Goal: Cardiovascular complication will be avoided Outcome: Progressing   Problem: Coping: Goal: Level of anxiety will decrease Outcome: Progressing   Problem: Pain Management: Goal: General experience of comfort will improve Outcome: Progressing   Problem: Safety: Goal: Ability to remain free from injury will improve Outcome: Progressing   Problem: Clinical Measurements: Goal: Respiratory complications will improve Outcome: Not Progressing   Problem: Activity: Goal: Risk for activity intolerance will decrease Outcome: Not Progressing   Problem: Nutrition: Goal: Adequate nutrition will be maintained Outcome: Not Progressing   Problem: Elimination: Goal: Will not experience complications related to urinary retention Outcome: Not Progressing

## 2023-08-10 NOTE — Progress Notes (Signed)
PT Cancellation Note  Patient Details Name: Denise Mckenzie MRN: [id number] DOB: [birth year]   Cancelled Treatment:    Reason Eval/Treat Not Completed: Medical issues which prohibited therapy, noted multiple medical issues that warrant no PT  treatment, VQ scan pending, soft BP, Palliative medicine consult. Will follow. Blanchard Kelch PT Acute Rehabilitation Services Office [phone] Weekend [phone]    Rada Hay 08/10/2023, 10:29 AM

## 2023-08-10 NOTE — Progress Notes (Signed)
BiPAP on for about an hour, just took it off and placed pt back on 5L of HFNC,sating 100%. Pt reports that the Bipap mask was getting uncomfortable and was unable to sleep with it on

## 2023-08-10 NOTE — Progress Notes (Signed)
[facility]                                                                              Allyson Rima, is a 77 y.o. female, DOB - 22-Apr-1946, MWN:[id number] Admit date - 08/01/2023    Outpatient Primary MD for the patient is Burton Apley, MD  LOS - 9  days  Chief Complaint  Patient presents with   Shortness of Breath   Altered Mental Status       Brief summary   Patient is a 77 year old female with CAD, CABG in 49, COPD, HTN, HLP, hypothyroidism, diabetes mellitus, chronic respiratory failure on 3 L, O2 BNC, history of breast CVA, lung CA had presented with altered mental status and shortness of breath.   Patient had been recently admitted 11/25 to 12/6 with anasarca, CHF exacerbation, A-fib with RVR.  Patient was treated with diuresis, underwent TEE/DCCV for A-fib.  She was discharged to SNF, presented back with wheezing, shortness of breath and altered mental status.  Initially noted to be in hypercapnic respiratory failure and was placed on BiPAP. CT suggested possible pneumonia and was placed on vancomycin and cefepime for HCAP.  Blood cultures grew GPC/Enterococcus faecalis, transition to Unasyn per ID recommendations.  Cardiology was also consulted.  2D echo showed preserved EF without vegetation. ID recommended ampicillin IV every 24 hours with end date of 08/16/2023.  On 12/20, patient noted to be hypotensive, torsemide, Cardizem held.  FOBT positive.  GI was consulted.  Patient continued to have more GI bleeding. Dr Marland Mcalpine discussed with gastroenterology, Dr. Dulce Sellar recommend CT angio of abdomen and pelvis, no plan for colonoscopy.  Assessment & Plan    Principal Problem: Enterococcus faecalis bacteremia --Blood cultures showed GPC/Enterococcus faecalis. -2D echo showed preserved EF 65%, enlarged RV without vegetation. -ID was consulted, recommended ampicillin IV 2 g every 4 hours, stop date on 08/16/2023 -COVID, flu, RSV, urine strep antigen  negative -Continue bronchodilators, incentive spirometry, flutter valve -No plans for TEE  Active Problems: Acute respiratory failure with hypoxia, hypercapnia, HCAP Acute on chronic HFpEF, pulmonary hypertension, RV failure/dilation History of CAD, CABG 1996 Bilateral pleural effusion -Outpatient on torsemide 40 mg twice daily -Overnight on BiPAP, then placed on 15 L O2 via [place].  At the time of my encounter, weaned to 8 L O2. -Significantly volume overloaded, received torsemide 20 mg and Lasix 40 mg IV x 1 on 12/23.  BNP 1393.  D-dimer 0.92.  Chest x-ray showed small bilateral pleural effusions -Received IV albumin, midodrine 10 mg 3 times daily for BP support to allow effective diuresis -Placed on Lasix 60 mg every 12 hours, strict I's and O's and daily weights -Cardiology reconsulted, not a candidate for advanced heart failure therapies.  Palliative care consult placed -CTA chest to rule out PE although clinical picture more consistent with fluid overload.  FOBT positive, lower GI bleed, ABLA -Anemia panel showed iron 44, UIBC 88, TIBC 132, ferritin 185, folate 6.5, B12 350 -Previous attending, Dr. Marland Mcalpine discussed with GI, Dr. Dulce Sellar, recommended CTA abdomen pelvis for active GI bleeding -Continue to hold anticoagulation for now.  Before starting eliquis, will start heparin drip  challenge. -Hemoglobin 7.8, no obvious bleeding.  If H&H stable in a.m and improving.,  Will place on IV heparin drip  Persistent hypotension - Likely in the setting of infection, antihypertensives, diuretics -Continue midodrine 10 mg 3 times daily, received IV albumin x 3   Paroxysmal atrial fibrillation status post cardioversion on 07/06/2023 -Currently on amiodarone 200 mg daily, digoxin 0.125 mg daily, as needed metoprolol -Heart rate controlled, remains in flutter -Eliquis currently on hold due to acute anemia, GI bleed - Follow-up with cardiology scheduled on 09/01/22  History of right upper lobe lung  cancer History of breast cancer -Both in remission  Acute Urinary Retention -Has been wheelchair-bound basically since a few months ago -Continue Foley catheter   Hyperlipidemia -Will resume statin at discharge, LFTs normalized   Hypothyroidism -Continue Synthroid  Thrombocytopenia, acute -Likely due to bleeding and acute illness - PLT Counts improving  Pressure injury documentation Sacrum left deep tissue pressure injury, not POA -Wound care per nursing   Nutrition Problem: Inadequate oral intake Etiology: decreased appetite, other (see comment) (dislike of available food) Signs/Symptoms: energy intake < or equal to 50% for > or equal to 5 days Interventions: Refer to RD note for recommendations, Magic cup, MVI, Liberalize Diet  Estimated body mass index is 28.19 kg/m as calculated from the following:   Height as of this encounter: 5\' 2"  (1.575 m).   Weight as of this encounter: 69.9 kg.  Code Status: Full CODE STATUS DVT Prophylaxis:  SCDs   Level of Care: Level of care: Stepdown Family Communication: Updated patient Disposition Plan:      Remains inpatient appropriate:   Palliative medicine consult placed for goals of care   Procedures:    Consultants:   Infectious diseases Cardiology  Antimicrobials:   Anti-infectives (From admission, onward)    Start     Dose/Rate Route Frequency Ordered Stop   08/04/23 1200  ampicillin (OMNIPEN) 2 g in sodium chloride 0.9 % 100 mL IVPB        2 g 300 mL/hr over 20 Minutes Intravenous Every 4 hours 08/04/23 1022     08/03/23 0600  Ampicillin-Sulbactam (UNASYN) 3 g in sodium chloride 0.9 % 100 mL IVPB  Status:  Discontinued        3 g 200 mL/hr over 30 Minutes Intravenous Every 6 hours 08/02/23 2139 08/04/23 1022   08/02/23 1900  vancomycin (VANCOCIN) IVPB 1000 mg/200 mL premix  Status:  Discontinued        1,000 mg 200 mL/hr over 60 Minutes Intravenous Every 24 hours 08/01/23 2028 08/02/23 1325   08/02/23 0600   piperacillin-tazobactam (ZOSYN) IVPB 3.375 g  Status:  Discontinued        3.375 g 12.5 mL/hr over 240 Minutes Intravenous Every 8 hours 08/01/23 2028 08/02/23 2139   08/01/23 1830  Vancomycin (VANCOCIN) 1,500 mg in sodium chloride 0.9 % 500 mL IVPB        1,500 mg 250 mL/hr over 120 Minutes Intravenous  Once 08/01/23 1743 08/02/23 0848   08/01/23 1800  ceFEPIme (MAXIPIME) 2 g in sodium chloride 0.9 % 100 mL IVPB        2 g 200 mL/hr over 30 Minutes Intravenous  Once 08/01/23 1743 08/01/23 1849          Medications  amiodarone  200 mg Oral Daily   amitriptyline  50 mg Oral QHS   Chlorhexidine Gluconate Cloth  6 each Topical Daily   digoxin  0.125 mg Oral Daily   docusate  sodium  100 mg Oral BID   furosemide  60 mg Intravenous Q12H   Gerhardt's butt cream   Topical Daily   hydrocortisone  25 mg Rectal BID   levothyroxine  88 mcg Oral QAC breakfast   midodrine  10 mg Oral TID WC   mometasone-formoterol  2 puff Inhalation BID    morphine injection  1 mg Intravenous Once   multivitamin with minerals  1 tablet Oral Daily   pantoprazole (PROTONIX) IV  40 mg Intravenous Q12H   polyethylene glycol  17 g Oral BID   senna-docusate  1 tablet Oral BID   sodium chloride flush  10-40 mL Intracatheter Q12H      Subjective:   Kinzleigh Altman was seen and examined today.  Overnight on BiPAP, subsequently on 15 L HFNC, weaned to 8 L at the encounter.  Still having short of breath, no chest pain, no dizziness, lightheadedness, nausea or vomiting.  Very deconditioned.    Objective:   Vitals:   08/10/23 0400 08/10/23 0500 08/10/23 0600 08/10/23 0735  BP: (!) 133/35 (!) 112/27 (!) 113/31   Pulse: 84 (!) 59 60   Resp: 15 12 (!) 25   Temp:    98.2 F (36.8 C)  TempSrc:    Axillary  SpO2: 100% 100% 100%   Weight:      Height:        Intake/Output Summary (Last 24 hours) at 08/10/2023 0924 Last data filed at 08/10/2023 6578 Gross per 24 hour  Intake 1316.22 ml  Output 1340 ml  Net  -23.78 ml     Wt Readings from Last 3 Encounters:  08/08/23 69.9 kg  07/30/23 69.5 kg  07/25/23 69.4 kg   Physical Exam General: Alert and oriented x 3, NAD, frail and ill-appearing Cardiovascular: S1 S2 clear, RRR.  Respiratory: Diminished BS throughout, no wheezing Gastrointestinal: Soft, nontender, nondistended, NBS Ext: 2+ pedal edema bilaterally, Unna boots Neuro: no new deficits Psych: Normal affect     Data Reviewed:  I have personally reviewed following labs    CBC Lab Results  Component Value Date   WBC 10.5 08/10/2023   RBC 2.11 (L) 08/10/2023   HGB 6.0 (LL) 08/10/2023   HCT 19.9 (L) 08/10/2023   MCV 94.3 08/10/2023   MCH 28.4 08/10/2023   PLT 83 (L) 08/10/2023   MCHC 30.2 08/10/2023   RDW 17.0 (H) 08/10/2023   LYMPHSABS 0.3 (L) 08/08/2023   MONOABS 0.6 08/08/2023   EOSABS 0.2 08/08/2023   BASOSABS 0.0 08/08/2023     Last metabolic panel Lab Results  Component Value Date   NA 142 08/10/2023   K 3.6 08/10/2023   CL 98 08/10/2023   CO2 37 (H) 08/10/2023   BUN 23 08/10/2023   CREATININE 0.46 08/10/2023   GLUCOSE 93 08/10/2023   GFRNONAA >60 08/10/2023   GFRAA >60 07/06/2019   CALCIUM 7.6 (L) 08/10/2023   PHOS 2.4 (L) 08/08/2023   PROT 3.9 (L) 08/08/2023   ALBUMIN 2.0 (L) 08/08/2023   LABGLOB 2.8 04/16/2021   AGRATIO 1.5 04/16/2021   BILITOT 0.8 08/08/2023   ALKPHOS 55 08/08/2023   AST 25 08/08/2023   ALT 34 08/08/2023   ANIONGAP 7 08/10/2023    CBG (last 3)  Recent Labs    08/09/23 2327 08/10/23 0338 08/10/23 0740  GLUCAP 145* 93 82      Coagulation Profile: No results for input(s): "INR", "PROTIME" in the last 168 hours.   Radiology Studies: I have personally reviewed the imaging  studies  DG Chest Port 1 View Result Date: 08/09/2023 CLINICAL DATA:  Shortness of breath. EXAM: PORTABLE CHEST 1 VIEW COMPARISON:  Chest radiograph dated 08/08/2023. FINDINGS: Right-sided PICC in similar position. Bilateral mid to lower lung field  atelectasis. Pneumonia is not excluded. Trace pleural effusion may be present. No pneumothorax. Stable cardiomediastinal silhouette. Atherosclerotic calcification of the aorta. Median sternotomy wires and CABG vascular clips. No acute osseous pathology. IMPRESSION: Probable small bilateral pleural effusions and bilateral mid to lower lung field atelectasis. Electronically Signed   By: Elgie Collard M.D.   On: 08/09/2023 20:06       Shauniece Kwan M.D. [facility] 08/10/2023, 9:24 AM  Available via Epic secure chat 7am-7pm After 7 pm, please refer to night coverage provider listed on amion.

## 2023-08-10 NOTE — Progress Notes (Addendum)
Patient Name: Denise Mckenzie Date of Encounter: 08/10/2023 [facility] Cardiologist: Armanda Magic, MD   Interval Summary  .   She is working to breathe.  Otherwise she can answer questions and would like to know the plan.  Vital Signs .    Vitals:   08/10/23 0400 08/10/23 0500 08/10/23 0600 08/10/23 0735  BP: (!) 133/35 (!) 112/27 (!) 113/31   Pulse: 84 (!) 59 60   Resp: 15 12 (!) 25   Temp:    98.2 F (36.8 C)  TempSrc:    Axillary  SpO2: 100% 100% 100%   Weight:      Height:        Intake/Output Summary (Last 24 hours) at 08/10/2023 1019 Last data filed at 08/10/2023 5621 Gross per 24 hour  Intake 1556.22 ml  Output 1340 ml  Net 216.22 ml      08/08/2023    4:00 AM 08/02/2023    6:39 PM 07/30/2023   10:48 PM  Last 3 Weights  Weight (lbs) 154 lb 1.6 oz 153 lb 3.5 oz 153 lb 3.5 oz  Weight (kg) 69.9 kg 69.5 kg 69.5 kg      Telemetry/ECG    Variable atrial flutter rates improved- Personally Reviewed  Physical Exam .    VS:  BP (!) 113/31   Pulse 60   Temp 98.2 F (36.8 C) (Axillary)   Resp (!) 25   Ht 5\' 2"  (1.575 m)   Wt 69.9 kg   SpO2 100%   BMI 28.19 kg/m  , BMI Body mass index is 28.19 kg/m. GENERAL: Mild work of breathing HEENT: Pupils equal round and reactive, fundi not visualized, oral mucosa unremarkable NECK: Significant JVD LUNGS:  decreased BS HEART: Irregularly irregular  ABD: Nondistended EXT:  2 plus pulses throughout, no edema, no cyanosis no clubbing SKIN:  No rashes no nodules NEURO:  Cranial nerves II through XII grossly intact, motor grossly intact throughout PSYCH:  Cognitively intact, oriented to person place and time   Assessment & Plan .     Denise Mckenzie is a 57F with  CAD s/p CABG, COPD, persistent atrial fibrillation/flutter, pulmonary hypertension, hypertension, hyperlipidemia, breast/lung cancer, diabetes admitted with shortness of breath and HCAP.  Cardiology consulted for atrial flutter per family request.     # Acute on chronic HFpEF: # Pulm hypertension group 2/3: RV failure Unfortunately she has a significantly dilated RV with poor function.  Recommend BiPAP when feasible to help reduce her preload.  Her blood pressure has been soft and diuresis has been challenging.  Can attempt Lasix 60 mg IV twice daily as long as blood pressures will allow.  Otherwise unfortunately there is not much else to add, she is not a candidate for advanced heart failure therapies.  Therefore I recommended palliative care consult  # Persistent atrial flutter:  Patient underwent TEE/DCCV 06/2023 but quickly went back into atrial flutter.  She remains in flutter at this time.  Now on amiodarone with plan for repeat DCCV once amiodarone is loaded.  Rate is well-controlled.  She has received well over 5 g load.  Continue 200 mg daily.  She had hand tremors at higher doses.  Continue digoxin.  Her Eliquis has stopped due to acute anemia in the setting of GI bleed   # HCAP: # E faecalis bacteremia: Management per ID/primary team.  No plans for TEE.    Time Spent Directly with Patient:  I have spent a total of 35 minutes  with the patient reviewing hospital notes, telemetry, EKGs, labs and examining the patient as well as establishing an assessment and plan that was discussed personally with the patient.    For questions or updates, please contact [facility] Please consult [email or web address] for contact info under        Signed, Keymoni Mccaster, Alben Spittle, MD

## 2023-08-10 NOTE — Progress Notes (Signed)
Speech Language Pathology Treatment: Dysphagia  Patient Details Name: Denise Mckenzie MRN: [id number] DOB: 09-05-1945 Today's Date: 08/10/2023 Time: 6578-4696 SLP Time Calculation (min) (ACUTE ONLY): 10 min  Assessment / Plan / Recommendation Clinical Impression  Patient seen by SLP for skilled treatment focused on dysphagia goals. Per RN, she has been tolerating PO's better today and he was able to try some meds whole (not crushed). In addition, patient has been able to feed self better today as well. When SLP arrived into room, patient awake, alert, appears more alert and more interactive today. She requested some ice water. SLP retrieved this and RN also arrived with some of her medications. Patient sipped on water and swallowed her pills one at a time. She did not exhibit any overt s/s aspiration but did require a couple extra sips of water before she felt that the pills had fully transited. SLP will plan to f/u at least one more time to ensure toleration.   HPI HPI: Patient is a 77 y.o. female who was assessed via BSE and MBS this admission on 12/17 secondary to patient c/o difficult with swallowing pills and RN observing patient to have instances of coughing when drinking liquids. MBS showed no penetration or aspiration, no residue. Swallow initiation at pyriform sinsues. Barium tablet lodged in vallecuale and thoracis esopahgus, passed with puree.  SLP signed off. Reordered on 12/20 due to RN observing consistent and significant coughing with liquids with concern for aspiration though most recent image reports "Decreased bibasilar opacities suggesting improving edema or  pneumonia".  Pt previously admitted 11/25-12/6/24 with anasarca, CHF exacerbation, a-fib with RVR. She was discharged to [facility]. She presented to hospital on 12/15 with SOB, wheezing and altered.  CT chest suggestive of PNA. She was placed on BiPAP. PMH: CAD, h/o CABG 1996, COPD, HTN, HLD, hypothyroidism, DM-2, chronic  respiratory failure on 3L, h/o breast and lung cancer.      SLP Plan  Continue with current plan of care      Recommendations for follow up therapy are one component of a multi-disciplinary discharge planning process, led by the attending physician.  Recommendations may be updated based on patient status, additional functional criteria and insurance authorization.    Recommendations  Diet recommendations: Regular;Thin liquid Liquids provided via: Cup;Straw Medication Administration: Other (Comment) (crush if large, whole if small, one at a time) Supervision: Patient able to self feed;Full supervision/cueing for compensatory strategies Compensations: Slow rate;Small sips/bites Postural Changes and/or Swallow Maneuvers: Seated upright 90 degrees                  Oral care BID   Intermittent Supervision/Assistance Dysphagia, oropharyngeal phase (R13.12)     Continue with current plan of care   Angela Nevin, MA, CCC-SLP Speech Therapy

## 2023-08-11 DIAGNOSIS — R7881 Bacteremia: Secondary | ICD-10-CM | POA: Diagnosis not present

## 2023-08-11 DIAGNOSIS — I483 Typical atrial flutter: Secondary | ICD-10-CM | POA: Diagnosis not present

## 2023-08-11 DIAGNOSIS — J9622 Acute and chronic respiratory failure with hypercapnia: Secondary | ICD-10-CM | POA: Diagnosis not present

## 2023-08-11 DIAGNOSIS — I5033 Acute on chronic diastolic (congestive) heart failure: Secondary | ICD-10-CM | POA: Diagnosis not present

## 2023-08-11 DIAGNOSIS — J410 Simple chronic bronchitis: Secondary | ICD-10-CM | POA: Diagnosis not present

## 2023-08-11 DIAGNOSIS — I50810 Right heart failure, unspecified: Secondary | ICD-10-CM | POA: Diagnosis not present

## 2023-08-11 LAB — GLUCOSE, CAPILLARY
Glucose-Capillary: 130 mg/dL — ABNORMAL HIGH (ref 70–99)
Glucose-Capillary: 147 mg/dL — ABNORMAL HIGH (ref 70–99)
Glucose-Capillary: 121 mg/dL — ABNORMAL HIGH (ref 70–99)
Glucose-Capillary: 118 mg/dL — ABNORMAL HIGH (ref 70–99)
Glucose-Capillary: 97 mg/dL (ref 70–99)
Glucose-Capillary: 120 mg/dL — ABNORMAL HIGH (ref 70–99)

## 2023-08-11 LAB — CBC
HCT: 25.6 % — ABNORMAL LOW (ref 36.0–46.0)
Hemoglobin: 7.9 g/dL — ABNORMAL LOW (ref 12.0–15.0)
MCH: 29.9 pg (ref 26.0–34.0)
MCHC: 30.9 g/dL (ref 30.0–36.0)
MCV: 97 fL (ref 80.0–100.0)
Platelets: 107 10*3/uL — ABNORMAL LOW (ref 150–400)
RBC: 2.64 MIL/uL — ABNORMAL LOW (ref 3.87–5.11)
RDW: 17.1 % — ABNORMAL HIGH (ref 11.5–15.5)
WBC: 13.5 10*3/uL — ABNORMAL HIGH (ref 4.0–10.5)
nRBC: 0 % (ref 0.0–0.2)

## 2023-08-11 LAB — HEPATIC FUNCTION PANEL
ALT: 32 U/L (ref 0–44)
AST: 28 U/L (ref 15–41)
Albumin: 2.8 g/dL — ABNORMAL LOW (ref 3.5–5.0)
Alkaline Phosphatase: 61 U/L (ref 38–126)
Bilirubin, Direct: 0.1 mg/dL (ref 0.0–0.2)
Indirect Bilirubin: 0.3 mg/dL (ref 0.3–0.9)
Total Bilirubin: 0.4 mg/dL (ref <1.2)
Total Protein: 4.4 g/dL — ABNORMAL LOW (ref 6.5–8.1)

## 2023-08-11 LAB — BASIC METABOLIC PANEL
Anion gap: 4 — ABNORMAL LOW (ref 5–15)
BUN: 22 mg/dL (ref 8–23)
CO2: 39 mmol/L — ABNORMAL HIGH (ref 22–32)
Calcium: 7.5 mg/dL — ABNORMAL LOW (ref 8.9–10.3)
Chloride: 96 mmol/L — ABNORMAL LOW (ref 98–111)
Creatinine, Ser: 0.47 mg/dL (ref 0.44–1.00)
GFR, Estimated: >60 mL/min (ref >60)
Glucose, Bld: 121 mg/dL — ABNORMAL HIGH (ref 70–99)
Potassium: 3.4 mmol/L — ABNORMAL LOW (ref 3.5–5.1)
Sodium: 139 mmol/L (ref 135–145)

## 2023-08-11 LAB — MAGNESIUM: Magnesium: 2.2 mg/dL (ref 1.7–2.4)

## 2023-08-11 MED ORDER — POTASSIUM CHLORIDE CRYS ER 20 MEQ PO TBCR
40.0000 meq | EXTENDED_RELEASE_TABLET | Freq: Once | ORAL | Status: AC
  Administered 2023-08-11: 40 meq via ORAL
  Filled 2023-08-11: qty 2

## 2023-08-11 MED ORDER — FUROSEMIDE 10 MG/ML IJ SOLN: 80.0000 mg | Freq: Two times a day (BID) | INTRAMUSCULAR | Status: DC

## 2023-08-11 MED ORDER — ALBUMIN HUMAN 25 % IV SOLN
25.0000 g | Freq: Three times a day (TID) | INTRAVENOUS | Status: AC
  Administered 2023-08-11 (×2): 25 g via INTRAVENOUS
  Filled 2023-08-11 (×2): qty 100

## 2023-08-11 MED ORDER — FUROSEMIDE 10 MG/ML IJ SOLN
120.0000 mg | Freq: Two times a day (BID) | INTRAVENOUS | Status: DC
  Administered 2023-08-11: 120 mg via INTRAVENOUS
  Filled 2023-08-11: qty 12
  Filled 2023-08-11: qty 10
  Filled 2023-08-11: qty 12

## 2023-08-11 MED ORDER — MIDODRINE HCL 5 MG PO TABS
15.0000 mg | ORAL_TABLET | Freq: Three times a day (TID) | ORAL | Status: DC
  Administered 2023-08-11 (×2): 15 mg via ORAL
  Filled 2023-08-11 (×3): qty 3

## 2023-08-11 MED ORDER — FENTANYL CITRATE PF 50 MCG/ML IJ SOSY
12.5000 ug | PREFILLED_SYRINGE | INTRAMUSCULAR | Status: DC | PRN
  Administered 2023-08-11 – 2023-08-12 (×5): 12.5 ug via INTRAVENOUS
  Filled 2023-08-11 (×5): qty 1

## 2023-08-11 NOTE — Consult Note (Signed)
Consultation Note Date: 08/11/2023   Patient Name: Denise Mckenzie  DOB: 1946/05/06  MRN: [id number]  Age / Sex: 77 y.o., female  PCP: Burton Apley, MD Referring Physician: Cathren Harsh, MD  Reason for Consultation: Establishing goals of care  HPI/Patient Profile: 77 y.o. female admitted on 08/01/2023  Patient is a 77 year old female with CAD, CABG in 45, COPD, HTN, HLP, hypothyroidism, diabetes mellitus, chronic respiratory failure on 3 L, O2 BNC, history of breast CVA, lung CA had presented with altered mental status and shortness of breath.   Patient had been recently admitted 11/25 to 12/6 with anasarca, CHF exacerbation, A-fib with RVR.  Patient was treated with diuresis, underwent TEE/DCCV for A-fib.  She was discharged to SNF, presented back with wheezing, shortness of breath and altered mental status.  Initially noted to be in hypercapnic respiratory failure and was placed on BiPAP. CT suggested possible pneumonia and was placed on vancomycin and cefepime for HCAP.  Blood cultures grew GPC/Enterococcus faecalis, transition to Unasyn per ID recommendations.  Cardiology was also consulted.  2D echo showed preserved EF without vegetation. ID recommended ampicillin IV every 24 hours with end date of 08/16/2023.   On 12/20, patient noted to be hypotensive, torsemide, Cardizem held.  FOBT positive.  GI was consulted.  Patient continued to have more GI bleeding. Dr Marland Mcalpine discussed with gastroenterology, Dr. Dulce Sellar recommend CT angio of abdomen and pelvis, no plan for colonoscopy.  Clinical Assessment and Goals of Care: Patient remains admitted for acute respiratory failure with hypoxia, hypercapnia, HCAP, acute on chronic HFpEF, pulmonary hypertension, RV failure/dilation, history of CAD, CABG 1996, bilateral pleural effusion.  Also with FOBT +, lower GI bleed and ABLA.  Has persistent hypotension.    Palliative consult for code status and goals of care discussions.  Patient seen, discussed with patient, with nursing colleagues also present in the room. Re discussed in the afternoon as well, when patient's son Tammy Sours was at bedside.   Palliative medicine is specialized medical care for people living with serious illness. It focuses on providing relief from the symptoms and stress of a serious illness. The goal is to improve quality of life for both the patient and the family. Goals of care: Broad aims of medical therapy in relation to the patient's values and preferences. Our aim is to provide medical care aimed at enabling patients to achieve the goals that matter most to them, given the circumstances of their particular medical situation and their constraints.     NEXT OF KIN  Son [name], daughter who is on her way from [place], spouse.   SUMMARY OF RECOMMENDATIONS    Goals of care discussions:  Patient indicates that she has made her living will, she elects for DNR DNI and does not want CPR, does not want artificial means at end of life, does not want intubation or mechanical ventilation.  Add low dose Fentanyl IV PRN.  Continue current mode of care.  Prognosis appears markedly limited, monitor hospital course for now.  Thank you  for the consult.   Code Status/Advance Care Planning: DNR   Symptom Management:     Palliative Prophylaxis:  Delirium Protocol    Psycho-social/Spiritual:  Desire for further Chaplaincy support:yes Additional Recommendations: Caregiving  Support/Resources  Prognosis:  Guarded   Discharge Planning: To Be Determined      Primary Diagnoses: Present on Admission:  Acute on chronic respiratory failure with hypercapnia (HCC)  Acute on chronic diastolic heart failure (HCC)  COPD (chronic obstructive pulmonary disease) (HCC)  Coronary artery disease  Hypertension  Hypothyroidism  PAF (paroxysmal atrial fibrillation) (HCC)  Primary  cancer of right upper lobe of lung (HCC)  RVF (right ventricular failure) (HCC)   I have reviewed the medical record, interviewed the patient and family, and examined the patient. The following aspects are pertinent.  Past Medical History:  Diagnosis Date   Acute exacerbation of CHF (congestive heart failure) (HCC) 07/12/2023   Arthritis    Breast cancer (HCC) dx'd 09/2015   COPD (chronic obstructive pulmonary disease) (HCC)    Coronary artery disease    Depression    Diabetes mellitus (HCC) 10/22/2015   Full dentures    full upper dentures, plate on bottom   History of bronchitis    History of pneumonia    Hyperlipidemia    Hypertension    Hypothyroidism 10/22/2015   lung ca dx;d 09/2015   lung cancer   Myocardial infarction [facility]) July 1995   Osteoporosis 10/22/2015   Personal history of radiation therapy    Stress incontinence    Uterine cancer (HCC) dx'd 1968   Social History   Socioeconomic History   Marital status: Married    Spouse name: Not on file   Number of children: Not on file   Years of education: Not on file   Highest education level: Not on file  Occupational History   Not on file  Tobacco Use   Smoking status: Former    Current packs/day: 0.00    Types: Cigarettes    Quit date: 08/17/1994    Years since quitting: 29.0    Passive exposure: Past   Smokeless tobacco: Never  Vaping Use   Vaping status: Never Used  Substance and Sexual Activity   Alcohol use: No   Drug use: No   Sexual activity: Not on file  Other Topics Concern   Not on file  Social History Narrative   Not on file   Social Drivers of Health   Financial Resource Strain: Not on file  Food Insecurity: No Food Insecurity (08/02/2023)   Hunger Vital Sign    Worried About Running Out of Food in the Last Year: Never true    Ran Out of Food in the Last Year: Never true  Recent Concern: Food Insecurity - Food Insecurity Present (07/13/2023)   Hunger Vital Sign    Worried About Running Out  of Food in the Last Year: Never true    Ran Out of Food in the Last Year: Sometimes true  Transportation Needs: No Transportation Needs (08/02/2023)   PRAPARE - Administrator, Civil Service (Medical): No    Lack of Transportation (Non-Medical): No  Physical Activity: Not on file  Stress: Not on file  Social Connections: Not on file   Family History  Problem Relation Age of Onset   Alzheimer's disease Mother    Heart attack Father    Breast cancer Neg Hx    Scheduled Meds:  amiodarone  200 mg Oral Daily   amitriptyline  50 mg Oral QHS   Chlorhexidine Gluconate Cloth  6 each Topical Daily   digoxin  0.125 mg Oral Daily   docusate sodium  100 mg Oral BID   Gerhardt's butt cream   Topical Daily   hydrocortisone  25 mg Rectal BID   levothyroxine  88 mcg Oral QAC breakfast   midodrine  15 mg Oral TID WC   mometasone-formoterol  2 puff Inhalation BID   multivitamin with minerals  1 tablet Oral Daily   pantoprazole (PROTONIX) IV  40 mg Intravenous Q12H   polyethylene glycol  17 g Oral BID   senna-docusate  1 tablet Oral BID   sodium chloride flush  10-40 mL Intracatheter Q12H   Continuous Infusions:  albumin human     ampicillin (OMNIPEN) IV Stopped (08/11/23 1259)   furosemide 120 mg (08/11/23 1349)   PRN Meds:.acetaminophen **OR** acetaminophen, alum & mag hydroxide-simeth, antiseptic oral rinse, benzonatate, bisacodyl, fentaNYL (SUBLIMAZE) injection, hydrALAZINE, levalbuterol, lip balm, metoprolol tartrate, nitroGLYCERIN, ondansetron **OR** ondansetron (ZOFRAN) IV, mouth rinse, sodium chloride flush, sodium chloride flush, traMADol, traZODone Medications Prior to Admission:  Prior to Admission medications   Medication Sig Start Date End Date Taking? Authorizing Provider  albuterol (VENTOLIN HFA) 108 (90 Base) MCG/ACT inhaler Inhale 2 puffs into the lungs 4 (four) times daily as needed for shortness of breath. 05/18/21  Yes [provider]  amiodarone  (PACERONE) 400 MG tablet Take 1 tablet (400 mg total) by mouth 2 (two) times daily. 07/23/23  Yes Briant Cedar, MD  amitriptyline (ELAVIL) 50 MG tablet Take 50 mg by mouth at bedtime.     Yes [provider]  apixaban (ELIQUIS) 5 MG TABS tablet Take 1 tablet (5 mg total) by mouth 2 (two) times daily. 07/23/23  Yes Briant Cedar, MD  atorvastatin (LIPITOR) 40 MG tablet Take 40 mg by mouth at bedtime.    Yes [provider]  busPIRone (BUSPAR) 5 MG tablet Take 5 mg by mouth 2 (two) times daily.   Yes [provider]  Calamine-Zinc Oxide LOTN Apply 1 application  topically See admin instructions. On Monday and Thursday   Yes [provider]  Calcium Citrate-Vitamin D (CALCIUM + D PO) Take 2 tablets by mouth 3 (three) times daily. Each tablet Calcium 300 mg, Vitamin D3 250 I.U.   Yes [provider]  Cholecalciferol (VITAMIN D3) 50 MCG (2000 UT) CAPS Take 2,000 Units by mouth daily.   Yes [provider]  digoxin (LANOXIN) 0.125 MG tablet Take 1 tablet (0.125 mg total) by mouth daily. 07/24/23  Yes Briant Cedar, MD  diltiazem (CARDIZEM CD) 120 MG 24 hr capsule Take 1 capsule (120 mg total) by mouth daily. 07/24/23  Yes Briant Cedar, MD  docusate sodium (COLACE) 100 MG capsule Take 1 capsule (100 mg total) by mouth 2 (two) times daily. Patient taking differently: Take 100 mg by mouth 2 (two) times daily as needed for moderate constipation. 07/23/23  Yes Briant Cedar, MD  doxycycline (ADOXA) 100 MG tablet Take 100 mg by mouth 2 (two) times daily.   Yes [provider]  FARXIGA 10 MG TABS tablet TAKE 1 TABLET BY MOUTH DAILY BEFORE BREAKFAST. 10/02/22  Yes Georgeanna Lea, MD  ketoconazole (NIZORAL) 2 % cream Apply 1 application topically daily. Patient taking differently: Apply 1 application  topically daily as needed for irritation. 06/04/20  Yes Magrinat, Valentino Hue, MD  KLOR-CON M20 20 MEQ tablet TAKE 1  TABLET BY MOUTH  TWICE A DAY 06/11/23  Yes Georgeanna Lea, MD  lactulose (CHRONULAC) 10 GM/15ML solution Take 30 mLs (20 g total) by mouth 2 (two) times daily as needed for mild constipation. 07/23/23  Yes Briant Cedar, MD  levalbuterol Pauline Aus) 0.63 MG/3ML nebulizer solution Take 3 mLs (0.63 mg total) by nebulization every 6 (six) hours as needed for wheezing or shortness of breath. 07/23/23  Yes Briant Cedar, MD  levothyroxine (SYNTHROID, LEVOTHROID) 88 MCG tablet Take 88 mcg by mouth daily before breakfast.    Yes [provider]  metFORMIN (GLUCOPHAGE) 500 MG tablet Take 500 mg by mouth daily. 09/17/15  Yes [provider]  midodrine (PROAMATINE) 5 MG tablet Take 1 tablet (5 mg total) by mouth 3 (three) times daily with meals. 07/23/23  Yes Briant Cedar, MD  mometasone-formoterol (DULERA) 200-5 MCG/ACT AERO Inhale 2 puffs into the lungs 2 (two) times daily. 07/23/23  Yes Briant Cedar, MD  nitroGLYCERIN (NITROSTAT) 0.4 MG SL tablet Place 1 tablet (0.4 mg total) under the tongue every 5 (five) minutes as needed for chest pain. Reported on 01/28/2016 05/29/21  Yes Baldo Daub, MD  torsemide (DEMADEX) 20 MG tablet Take 2 tablets (40 mg total) by mouth 2 (two) times daily. 07/02/23  Yes Conte, Tessa N, PA-C   No Known Allergies Review of Systems +back pain +shortness of breath  Physical Exam  Alert and oriented x 3, frail and ill-appearing S1 S2 clear, RRR. JVD+, monitor noted.   Tachypnea, decreased breath sounds throughout  Soft, nontender, nondistended, NBS  Ace wraps to bilateral lower extremity, 2-3+ pitting edema to exposed feet   no new deficits  Normal affect    Vital Signs: BP (!) 113/51   Pulse (!) 57   Temp (!) 97.5 F (36.4 C) (Axillary)   Resp 15   Ht 5\' 2"  (1.575 m)   Wt 69.9 kg   SpO2 100%   BMI 28.19 kg/m  Pain Scale: PAINAD   Pain Score: 6    SpO2: SpO2: 100 % O2 Device:SpO2: 100 % O2 Flow Rate: .O2 Flow  Rate (L/min): 4 L/min  IO: Intake/output summary:  Intake/Output Summary (Last 24 hours) at 08/11/2023 1418 Last data filed at 08/11/2023 0522 Gross per 24 hour  Intake 1096.75 ml  Output 750 ml  Net 346.75 ml    LBM: Last BM Date : 08/08/23 Baseline Weight: Weight: 69.5 kg Most recent weight: Weight: 69.9 kg     Palliative Assessment/Data:   PPS 30%  Time In:  1320 Time Out:  1420 Time Total:  60 min.  Greater than 50%  of this time was spent counseling and coordinating care related to the above assessment and plan.  Signed by: Rosalin Hawking, MD   Please contact Palliative Medicine Team phone at [phone] for questions and concerns.  For individual provider: See Loretha Stapler

## 2023-08-11 NOTE — Plan of Care (Signed)
  Problem: Fluid Volume: Goal: Ability to maintain a balanced intake and output will improve Outcome: Not Met (add Reason)   Problem: Health Behavior/Discharge Planning: Goal: Ability to identify and utilize available resources and services will improve Outcome: Not Met (add Reason) Goal: Ability to manage health-related needs will improve Outcome: Not Met (add Reason)

## 2023-08-11 NOTE — Progress Notes (Signed)
PT had large foul smelling BM with visible traces of blood. Blood was dark in color and minimal. No signs of acute bleeding from rectum. PT unable to tolerate turns well, oxygen saturation dropped to the 80's (low as 55% per charge RN) pt appeared to be "guppy" breathing at this time.BIPAP placed on patient to help recover oxygenation. Will continue to assess for change in status.

## 2023-08-11 NOTE — Progress Notes (Addendum)
[facility]                                                                              Denise Mckenzie, is a 77 y.o. female, DOB - 18-Oct-1945, DGU:[id number] Admit date - 08/01/2023    Outpatient Primary MD for the patient is Denise Apley, MD  LOS - 10  days  Chief Complaint  Patient presents with   Shortness of Breath   Altered Mental Status       Brief summary   Patient is a 77 year old female with CAD, CABG in 49, COPD, HTN, HLP, hypothyroidism, diabetes mellitus, chronic respiratory failure on 3 L, O2 BNC, history of breast CVA, lung CA had presented with altered mental status and shortness of breath.   Patient had been recently admitted 11/25 to 12/6 with anasarca, CHF exacerbation, A-fib with RVR.  Patient was treated with diuresis, underwent TEE/DCCV for A-fib.  She was discharged to SNF, presented back with wheezing, shortness of breath and altered mental status.  Initially noted to be in hypercapnic respiratory failure and was placed on BiPAP. CT suggested possible pneumonia and was placed on vancomycin and cefepime for HCAP.  Blood cultures grew GPC/Enterococcus faecalis, transition to Unasyn per ID recommendations.  Cardiology was also consulted.  2D echo showed preserved EF without vegetation. ID recommended ampicillin IV every 24 hours with end date of 08/16/2023.  On 12/20, patient noted to be hypotensive, torsemide, Cardizem held.  FOBT positive.  GI was consulted.  Patient continued to have more GI bleeding. Dr Denise Mckenzie discussed with gastroenterology, Dr. Dulce Mckenzie recommend CT angio of abdomen and pelvis, no plan for colonoscopy.  Assessment & Plan    Principal Problem: Enterococcus faecalis bacteremia --Blood cultures showed GPC/Enterococcus faecalis. -2D echo showed preserved EF 65%, enlarged RV without vegetation. -ID was consulted, recommended ampicillin IV 2 g every 4 hours, stop date on 08/16/2023 -COVID, flu, RSV, urine strep antigen  negative -This morning on O2 HFNC 4 L, tenuous respiratory status, BiPAP as needed.   Continue bronchodilators, I-S, flutter valve -No plans for TEE  Active Problems: Acute respiratory failure with hypoxia, hypercapnia, HCAP Acute on chronic HFpEF, pulmonary hypertension, RV failure/dilation History of CAD, CABG 1996 Bilateral pleural effusion -Outpatient on torsemide 40 mg twice daily -Continue BiPAP as needed, O2 via HFNC, on 4 L -Still significantly volume overloaded, discussed with cardiology, Dr. Bjorn Mckenzie, will increase IV Lasix to 120 mg twice daily -Increase midodrine to 15 mg 3 times daily and IV albumin for BP support for effective diuresis. -Seeing minimal output.  Not a candidate for advanced heart failure therapies. -CTA chest negative for PE. -Palliative medicine consulted for GOC.  FOBT positive, lower GI bleed, ABLA -Anemia panel showed iron 44, UIBC 88, TIBC 132, ferritin 185, folate 6.5, B12 350 -Previous attending, Dr. Marland Mckenzie discussed with GI, Dr. Dulce Mckenzie, recommended CTA abdomen pelvis for active GI bleeding -Continue to hold anticoagulation for now.  Currently eliquis on hold -H&H stable  Persistent hypotension - Likely in the setting of infection, antihypertensives, diuretics -Continue midodrine, increase to 15 mg 3 times daily, IV albumin for BP support   Paroxysmal atrial fibrillation status post  cardioversion on 07/06/2023 -Currently on amiodarone 200 mg daily, digoxin 0.125 mg daily, as needed metoprolol -Heart rate controlled, remains in flutter -Eliquis currently on hold due to acute anemia, GI bleed - Follow-up with cardiology scheduled on 09/01/22  History of right upper lobe lung cancer History of breast cancer -Both in remission  Acute Urinary Retention -Has been wheelchair-bound basically since a few months ago -Continue Foley catheter   Hyperlipidemia -Will resume statin at discharge, LFTs normalized   Hypothyroidism -Continue  Synthroid  Thrombocytopenia, acute -Likely due to bleeding and acute illness - PLT counts improving  Pressure injury documentation Sacrum left deep tissue pressure injury, not POA -Wound care per nursing   Nutrition Problem: Inadequate oral intake Etiology: decreased appetite, other (see comment) (dislike of available food) Signs/Symptoms: energy intake < or equal to 50% for > or equal to 5 days Interventions: Refer to RD note for recommendations, Magic cup, MVI, Liberalize Diet  Estimated body mass index is 28.19 kg/m as calculated from the following:   Height as of this encounter: 5\' 2"  (1.575 m).   Weight as of this encounter: 69.9 kg.  Code Status: Full CODE STATUS DVT Prophylaxis:  SCDs   Level of Care: Level of care: Stepdown Family Communication: Updated patient's son, Denise Mckenzie on the phone. Disposition Plan:      Remains inpatient appropriate:   Palliative consult.     Procedures:    Consultants:   Infectious diseases Cardiology  Antimicrobials:   Anti-infectives (From admission, onward)    Start     Dose/Rate Route Frequency Ordered Stop   08/04/23 1200  ampicillin (OMNIPEN) 2 g in sodium chloride 0.9 % 100 mL IVPB        2 g 300 mL/hr over 20 Minutes Intravenous Every 4 hours 08/04/23 1022     08/03/23 0600  Ampicillin-Sulbactam (UNASYN) 3 g in sodium chloride 0.9 % 100 mL IVPB  Status:  Discontinued        3 g 200 mL/hr over 30 Minutes Intravenous Every 6 hours 08/02/23 2139 08/04/23 1022   08/02/23 1900  vancomycin (VANCOCIN) IVPB 1000 mg/200 mL premix  Status:  Discontinued        1,000 mg 200 mL/hr over 60 Minutes Intravenous Every 24 hours 08/01/23 2028 08/02/23 1325   08/02/23 0600  piperacillin-tazobactam (ZOSYN) IVPB 3.375 g  Status:  Discontinued        3.375 g 12.5 mL/hr over 240 Minutes Intravenous Every 8 hours 08/01/23 2028 08/02/23 2139   08/01/23 1830  Vancomycin (VANCOCIN) 1,500 mg in sodium chloride 0.9 % 500 mL IVPB        1,500  mg 250 mL/hr over 120 Minutes Intravenous  Once 08/01/23 1743 08/02/23 0848   08/01/23 1800  ceFEPIme (MAXIPIME) 2 g in sodium chloride 0.9 % 100 mL IVPB        2 g 200 mL/hr over 30 Minutes Intravenous  Once 08/01/23 1743 08/01/23 1849          Medications  amiodarone  200 mg Oral Daily   amitriptyline  50 mg Oral QHS   Chlorhexidine Gluconate Cloth  6 each Topical Daily   digoxin  0.125 mg Oral Daily   docusate sodium  100 mg Oral BID   Gerhardt's butt cream   Topical Daily   hydrocortisone  25 mg Rectal BID   levothyroxine  88 mcg Oral QAC breakfast   midodrine  15 mg Oral TID WC   mometasone-formoterol  2 puff Inhalation BID  multivitamin with minerals  1 tablet Oral Daily   pantoprazole (PROTONIX) IV  40 mg Intravenous Q12H   polyethylene glycol  17 g Oral BID   potassium chloride  40 mEq Oral Once   senna-docusate  1 tablet Oral BID   sodium chloride flush  10-40 mL Intracatheter Q12H      Subjective:   Denise Mckenzie was seen and examined today.  Still significant volume overload, short of breath requiring BiPAP.  No chest pain, nausea vomiting or abdominal pain.  At the time of encounter, on 4 L O2 via [place].  Objective:   Vitals:   08/11/23 1000 08/11/23 1100 08/11/23 1107 08/11/23 1130  BP: (!) 108/44  (!) 107/39   Pulse: 84 (!) 103 69   Resp: (!) 30 (!) 21 (!) 26   Temp:    (!) 97.5 F (36.4 C)  TempSrc:    Axillary  SpO2: 93% 100% 97%   Weight:      Height:        Intake/Output Summary (Last 24 hours) at 08/11/2023 1217 Last data filed at 08/11/2023 0522 Gross per 24 hour  Intake 1096.75 ml  Output 750 ml  Net 346.75 ml     Wt Readings from Last 3 Encounters:  08/08/23 69.9 kg  07/30/23 69.5 kg  07/25/23 69.4 kg   Physical Exam General: Alert and oriented x 3, frail and ill-appearing Cardiovascular: S1 S2 clear, RRR. JVD+ Respiratory: Tachypnea, decreased breath sounds throughout Gastrointestinal: Soft, nontender, nondistended, NBS Ext: Ace  wraps to bilateral lower extremity, 2-3+ pitting edema to exposed feet  Neuro: no new deficits Psych: Normal affect     Data Reviewed:  I have personally reviewed following labs    CBC Lab Results  Component Value Date   WBC 13.5 (H) 08/11/2023   RBC 2.64 (L) 08/11/2023   HGB 7.9 (L) 08/11/2023   HCT 25.6 (L) 08/11/2023   MCV 97.0 08/11/2023   MCH 29.9 08/11/2023   PLT 107 (L) 08/11/2023   MCHC 30.9 08/11/2023   RDW 17.1 (H) 08/11/2023   LYMPHSABS 0.3 (L) 08/08/2023   MONOABS 0.6 08/08/2023   EOSABS 0.2 08/08/2023   BASOSABS 0.0 08/08/2023     Last metabolic panel Lab Results  Component Value Date   NA 139 08/11/2023   K 3.4 (L) 08/11/2023   CL 96 (L) 08/11/2023   CO2 39 (H) 08/11/2023   BUN 22 08/11/2023   CREATININE 0.47 08/11/2023   GLUCOSE 121 (H) 08/11/2023   GFRNONAA >60 08/11/2023   GFRAA >60 07/06/2019   CALCIUM 7.5 (L) 08/11/2023   PHOS 2.4 (L) 08/08/2023   PROT 3.9 (L) 08/08/2023   ALBUMIN 2.0 (L) 08/08/2023   LABGLOB 2.8 04/16/2021   AGRATIO 1.5 04/16/2021   BILITOT 0.8 08/08/2023   ALKPHOS 55 08/08/2023   AST 25 08/08/2023   ALT 34 08/08/2023   ANIONGAP 4 (L) 08/11/2023    CBG (last 3)  Recent Labs    08/11/23 0324 08/11/23 0815 08/11/23 1138  GLUCAP 130* 147* 121*      Coagulation Profile: No results for input(s): "INR", "PROTIME" in the last 168 hours.   Radiology Studies: I have personally reviewed the imaging studies  CT Angio Chest Pulmonary Embolism (PE) W or WO Contrast Result Date: 08/10/2023 CLINICAL DATA:  PE suspected, positive D-dimer history of lung cancer * Tracking Code: BO * EXAM: CT ANGIOGRAPHY CHEST WITH CONTRAST TECHNIQUE: Multidetector CT imaging of the chest was performed using the standard protocol during bolus  administration of intravenous contrast. Multiplanar CT image reconstructions and MIPs were obtained to evaluate the vascular anatomy. RADIATION DOSE REDUCTION: This exam was performed according to the  departmental dose-optimization program which includes automated exposure control, adjustment of the mA and/or kV according to patient size and/or use of iterative reconstruction technique. CONTRAST:  OMNIPAQUE IOHEXOL 350 MG/ML SOLN COMPARISON:  08/01/2023 FINDINGS: Cardiovascular: Satisfactory opacification of the pulmonary arteries to the segmental level. No evidence of pulmonary embolism. Cardiomegaly. Left and right coronary artery calcifications enlargement of the main pulmonary artery measuring up to 3.6 cm. No pericardial effusion. Aortic atherosclerosis. Mediastinum/Nodes: No enlarged mediastinal, hilar, or axillary lymph nodes. Thyroid gland, trachea, and esophagus demonstrate no significant findings. Lungs/Pleura: Small right, moderate left pleural effusions and associated atelectasis or consolidation, similar in volume to prior examination. Severe emphysema. Unchanged, treated mass in the medial anterior right upper lobe with associated biopsy marking and fiducial markers (series 10, image 67). Upper Abdomen: Moderate volume ascites, similar to prior examination. Gallstone. Musculoskeletal: Evidence of left lumpectomy. No acute osseous findings. Review of the MIP images confirms the above findings. IMPRESSION: 1. Negative examination for pulmonary embolism. 2. Small right, moderate left pleural effusions and associated atelectasis or consolidation, similar in volume to prior examination. 3. Unchanged, treated mass in the medial anterior right upper lobe with associated biopsy marking and fiducial markers. 4. Moderate volume ascites, similar to prior examination. 5. Cardiomegaly and coronary artery disease. 6. Severe emphysema. Aortic Atherosclerosis (ICD10-I70.0) and Emphysema (ICD10-J43.9). Electronically Signed   By: Jearld Lesch M.D.   On: 08/10/2023 17:32   DG Chest Port 1 View Result Date: 08/09/2023 CLINICAL DATA:  Shortness of breath. EXAM: PORTABLE CHEST 1 VIEW COMPARISON:  Chest  radiograph dated 08/08/2023. FINDINGS: Right-sided PICC in similar position. Bilateral mid to lower lung field atelectasis. Pneumonia is not excluded. Trace pleural effusion may be present. No pneumothorax. Stable cardiomediastinal silhouette. Atherosclerotic calcification of the aorta. Median sternotomy wires and CABG vascular clips. No acute osseous pathology. IMPRESSION: Probable small bilateral pleural effusions and bilateral mid to lower lung field atelectasis. Electronically Signed   By: Elgie Collard M.D.   On: 08/09/2023 20:06       Summer Parthasarathy M.D. [facility] 08/11/2023, 12:17 PM  Available via Epic secure chat 7am-7pm After 7 pm, please refer to night coverage provider listed on amion.

## 2023-08-11 NOTE — Progress Notes (Signed)
Patient Name: MORAYMA GOMOLKA Date of Encounter: 08/11/2023 [facility] Cardiologist: Armanda Magic, MD   Interval Summary  .   Incomplete I/Os yesterday.  Creatinine stable at 0.47.  Hemoglobin 7.9.  Had hypoxia this morning, required going on BiPAP.  Currently off Bipap on 4L [place]. Reports feeling short of breath.  Vital Signs .    Vitals:   08/11/23 0906 08/11/23 0907 08/11/23 0911 08/11/23 0952  BP:      Pulse: 100 (!) 103 75 (!) 186  Resp: (!) 37 (!) 24 (!) 21 (!) 30  Temp:      TempSrc:      SpO2: (S) (!) 84% 93% 97% (!) 89%  Weight:      Height:        Intake/Output Summary (Last 24 hours) at 08/11/2023 1104 Last data filed at 08/11/2023 0522 Gross per 24 hour  Intake 1293.02 ml  Output 750 ml  Net 543.02 ml      08/08/2023    4:00 AM 08/02/2023    6:39 PM 07/30/2023   10:48 PM  Last 3 Weights  Weight (lbs) 154 lb 1.6 oz 153 lb 3.5 oz 153 lb 3.5 oz  Weight (kg) 69.9 kg 69.5 kg 69.5 kg      Telemetry/ECG    atrial flutter rates 70s Personally Reviewed  Physical Exam .    VS:  BP (!) 116/41   Pulse (!) 186   Temp 97.6 F (36.4 C) (Axillary)   Resp (!) 30   Ht 5\' 2"  (1.575 m)   Wt 69.9 kg   SpO2 (!) 89%   BMI 28.19 kg/m  , BMI Body mass index is 28.19 kg/m. GENERAL: Increased work of breathing HEENT: Pupils equal round and reactive, fundi not visualized, oral mucosa unremarkable NECK: Significant JVD LUNGS:  decreased BS HEART: Irregularly irregular  ABD: Nondistended EXT: ace wrap around both legs to knees SKIN:  No rashes no nodules NEURO:  , motor grossly intact throughout PSYCH:  Cognitively intact, oriented to person place and time   Assessment & Plan .     Ms. Carillo is a 37F with  CAD s/p CABG, COPD, persistent atrial fibrillation/flutter, pulmonary hypertension, hypertension, hyperlipidemia, breast/lung cancer, diabetes admitted with shortness of breath and HCAP.  Cardiology consulted for atrial flutter per family request.    #  Acute on chronic HFpEF: # Pulm hypertension group 2/3: # RV failure # Acute respiratory failure with hypoxia Unfortunately she has a significantly dilated RV with poor function.  Her blood pressure has been soft and diuresis has been challenging; started on midodrine 10 mg TID to help with BP to allow for diuresis.  Also received IV albumin as albumin is very low.  -Minimal UOP with IV lasix 60 mg this morning.  She is still very volume overloaded.  Also with increased work of breathing.  Will increase lasix to 120mg  BID.  Continue midodrine/albumin to support BP  -D/w Dr Isidoro Donning. Agree with palliative care consult   # Persistent atrial flutter:  Patient underwent TEE/DCCV 06/2023 but quickly went back into atrial flutter.  She remains in flutter at this time.  Now on amiodarone with plan for repeat DCCV once amiodarone is loaded.  Rate is well-controlled.  She has received well over 5 g load.  Continue 200 mg daily.  She had hand tremors at higher doses.  Also on digoxin.  Her Eliquis was stopped due to acute anemia in the setting of GI bleed, which will  put on hold any plans for cardioversion   # HCAP: # E faecalis bacteremia: Management per ID/primary team.  No plans for TEE.   For questions or updates, please contact [facility] Please consult [email or web address] for contact info under        Signed, Little Ishikawa, MD

## 2023-08-12 DIAGNOSIS — J9622 Acute and chronic respiratory failure with hypercapnia: Secondary | ICD-10-CM | POA: Diagnosis not present

## 2023-08-12 DIAGNOSIS — R7881 Bacteremia: Secondary | ICD-10-CM | POA: Diagnosis not present

## 2023-08-12 DIAGNOSIS — I5033 Acute on chronic diastolic (congestive) heart failure: Secondary | ICD-10-CM | POA: Diagnosis not present

## 2023-08-12 DIAGNOSIS — I4892 Unspecified atrial flutter: Secondary | ICD-10-CM | POA: Diagnosis not present

## 2023-08-12 DIAGNOSIS — J410 Simple chronic bronchitis: Secondary | ICD-10-CM | POA: Diagnosis not present

## 2023-08-12 LAB — BASIC METABOLIC PANEL
Anion gap: 6 (ref 5–15)
BUN: 30 mg/dL — ABNORMAL HIGH (ref 8–23)
CO2: 36 mmol/L — ABNORMAL HIGH (ref 22–32)
Calcium: 8.1 mg/dL — ABNORMAL LOW (ref 8.9–10.3)
Chloride: 97 mmol/L — ABNORMAL LOW (ref 98–111)
Creatinine, Ser: 0.6 mg/dL (ref 0.44–1.00)
GFR, Estimated: >60 mL/min (ref >60)
Glucose, Bld: 124 mg/dL — ABNORMAL HIGH (ref 70–99)
Potassium: 3.9 mmol/L (ref 3.5–5.1)
Sodium: 139 mmol/L (ref 135–145)

## 2023-08-12 LAB — CBC
HCT: 24.5 % — ABNORMAL LOW (ref 36.0–46.0)
Hemoglobin: 7.2 g/dL — ABNORMAL LOW (ref 12.0–15.0)
MCH: 28.9 pg (ref 26.0–34.0)
MCHC: 29.4 g/dL — ABNORMAL LOW (ref 30.0–36.0)
MCV: 98.4 fL (ref 80.0–100.0)
Platelets: 103 10*3/uL — ABNORMAL LOW (ref 150–400)
RBC: 2.49 MIL/uL — ABNORMAL LOW (ref 3.87–5.11)
RDW: 17.4 % — ABNORMAL HIGH (ref 11.5–15.5)
WBC: 11.9 10*3/uL — ABNORMAL HIGH (ref 4.0–10.5)
nRBC: 0 % (ref 0.0–0.2)

## 2023-08-12 LAB — MAGNESIUM: Magnesium: 2.3 mg/dL (ref 1.7–2.4)

## 2023-08-12 LAB — GLUCOSE, CAPILLARY
Glucose-Capillary: 124 mg/dL — ABNORMAL HIGH (ref 70–99)
Glucose-Capillary: 108 mg/dL — ABNORMAL HIGH (ref 70–99)

## 2023-08-12 LAB — DIGOXIN LEVEL: Digoxin Level: 2.4 ng/mL — ABNORMAL HIGH (ref 0.8–2.0)

## 2023-08-12 LAB — PREPARE RBC (CROSSMATCH)

## 2023-08-12 MED ORDER — GLYCOPYRROLATE 0.2 MG/ML IJ SOLN: 0.2000 mg | INTRAMUSCULAR | Status: DC | PRN

## 2023-08-12 MED ORDER — SODIUM CHLORIDE 0.9% IV SOLUTION: Freq: Once | INTRAVENOUS | Status: DC

## 2023-08-12 MED ORDER — FENTANYL CITRATE PF 50 MCG/ML IJ SOSY
12.5000 ug | PREFILLED_SYRINGE | INTRAMUSCULAR | Status: DC | PRN
  Administered 2023-08-12 – 2023-08-13 (×9): 25 ug via INTRAVENOUS
  Filled 2023-08-12 (×9): qty 1

## 2023-08-12 MED ORDER — LORAZEPAM 2 MG/ML IJ SOLN
0.5000 mg | INTRAMUSCULAR | Status: DC | PRN
  Administered 2023-08-12 – 2023-08-13 (×2): 0.5 mg via INTRAVENOUS
  Filled 2023-08-12 (×2): qty 1

## 2023-08-12 NOTE — Progress Notes (Signed)
 Nutrition Brief Note  Chart reviewed. Pt now transitioning to comfort care.  No further nutrition interventions planned at this time.  Please re-consult as needed.   Shelle Iron RD, LDN Contact via [employer].

## 2023-08-12 NOTE — Progress Notes (Signed)
Patient Name: Denise Mckenzie Date of Encounter: 08/12/2023 [facility] Cardiologist: Armanda Magic, MD   Interval Summary  .   She received fentanyl and is more calm with her breathing.  Discussed with her family at bedside that unfortunately we cannot reverse her RV failure and she is end-stage.  I recommended that once important now is her comfort  Vital Signs .    Vitals:   08/12/23 0800 08/12/23 0900 08/12/23 1000 08/12/23 1100  BP: (!) 104/47 (!) 105/37 (!) 115/43 (!) 112/45  Pulse: 73 63 68 73  Resp: (!) 25 18 13  (!) 21  Temp:      TempSrc:      SpO2: 96% 99% 99% 92%  Weight:      Height:        Intake/Output Summary (Last 24 hours) at 08/12/2023 1130 Last data filed at 08/12/2023 0600 Gross per 24 hour  Intake 807.58 ml  Output 360 ml  Net 447.58 ml      08/12/2023    4:25 AM 08/08/2023    4:00 AM 08/02/2023    6:39 PM  Last 3 Weights  Weight (lbs) 160 lb 15 oz 154 lb 1.6 oz 153 lb 3.5 oz  Weight (kg) 73 kg 69.9 kg 69.5 kg      Telemetry/ECG  Rate controlled flutter personally Reviewed  Physical Exam .    VS:  BP (!) 112/45   Pulse 73   Temp 98.2 F (36.8 C) (Axillary)   Resp (!) 21   Ht 5\' 2"  (1.575 m)   Wt 73 kg   SpO2 92%   BMI 29.44 kg/m  , BMI Body mass index is 29.44 kg/m. GENERAL: Increased work of breathing HEENT: Pupils equal round and reactive, fundi not visualized, oral mucosa unremarkable NECK: Significant JVD LUNGS:  decreased BS HEART: Irregularly irregular  ABD: Nondistended EXT: ace wrap around both legs to knees SKIN:  No rashes no nodules NEURO:  , motor grossly intact throughout PSYCH:  Cognitively intact, oriented to person place and time   Assessment & Plan .     Ms. Baldree is a 54F with  CAD s/p CABG, COPD, persistent atrial fibrillation/flutter, pulmonary hypertension, hypertension, hyperlipidemia, breast/lung cancer, diabetes admitted with shortness of breath and HCAP.  Cardiology consulted for atrial flutter  per family request.    # Acute on chronic HFpEF: # Pulm hypertension group 2/3: # RV failure # Acute respiratory failure with hypoxia Unfortunately she has a significantly dilated RV with poor function.  Her blood pressure has been soft and diuresis has been challenging; started on midodrine 10 mg TID to help with BP to allow for diuresis.  Also received IV albumin as albumin is very low.  -Can attempt high-dose IV Lasix however she is not putting out any urine -  Continue midodrine/albumin to support BP  -D/w Dr Isidoro Donning. Agree with palliative care consult   # Persistent atrial flutter:  Patient underwent TEE/DCCV 06/2023 but quickly went back into atrial flutter.  She remains in flutter at this time.  Now on amiodarone with plan for repeat DCCV once amiodarone is loaded.  Rate is well-controlled.  She has received well over 5 g load.  Continue 200 mg daily.  She had hand tremors at higher doses.  Will stop her digoxin.  Her Eliquis was stopped due to acute anemia in the setting of GI bleed, which will put on hold any plans for cardioversion.  She is not really taking p.o. at  this point.   # HCAP: # E faecalis bacteremia: Management per ID/primary team.  No plans for TEE.  She has end-stage RV failure and now has a drop in her urine output.  I discussed with the family that unfortunately there are no therapies that can reverse this.  She is dying.  I recommend comfort care.  Cardiology will sign off  For questions or updates, please contact [facility] Please consult [email or web address] for contact info under     Time Spent Directly with Patient:  I have spent a total of 35 minutes with the patient reviewing hospital notes, telemetry, EKGs, labs and examining the patient as well as establishing an assessment and plan that was discussed personally with the patient.       Signed, Maisie Fus, MD

## 2023-08-12 NOTE — Progress Notes (Addendum)
Daily Progress Note   Patient Name: Denise Mckenzie       Date: 08/12/2023 DOB: [birth year]  Age: 77 y.o. MRN#: [id number] Attending Physician: Cathren Harsh, MD Primary Care Physician: Burton Apley, MD Admit Date: 08/01/2023  Reason for Consultation/Follow-up: Establishing goals of care  Subjective: Awake, not as alert as on 08-11-23, daughter from [place] at bedside, monitor noted, diminished urine output, mild to moderate degree of dyspnea evident. Goals of care re discussed with daughter Venus who is at bedside, see below.   Length of Stay: 11  Current Medications: Scheduled Meds:   sodium chloride   Intravenous Once   amiodarone  200 mg Oral Daily   amitriptyline  50 mg Oral QHS   Chlorhexidine Gluconate Cloth  6 each Topical Daily   docusate sodium  100 mg Oral BID   Gerhardt's butt cream   Topical Daily   hydrocortisone  25 mg Rectal BID   levothyroxine  88 mcg Oral QAC breakfast   midodrine  15 mg Oral TID WC   mometasone-formoterol  2 puff Inhalation BID   multivitamin with minerals  1 tablet Oral Daily   pantoprazole (PROTONIX) IV  40 mg Intravenous Q12H   polyethylene glycol  17 g Oral BID   senna-docusate  1 tablet Oral BID   sodium chloride flush  10-40 mL Intracatheter Q12H    Continuous Infusions:  albumin human Stopped (08/12/23 0027)   ampicillin (OMNIPEN) IV Stopped (08/12/23 0444)   furosemide Stopped (08/11/23 1449)    PRN Meds: acetaminophen **OR** acetaminophen, alum & mag hydroxide-simeth, antiseptic oral rinse, benzonatate, bisacodyl, fentaNYL (SUBLIMAZE) injection, hydrALAZINE, levalbuterol, lip balm, metoprolol tartrate, nitroGLYCERIN, ondansetron **OR** ondansetron (ZOFRAN) IV, mouth rinse, sodium chloride flush, sodium chloride flush, traMADol,  traZODone  Physical Exam         Appears in at least mild-moderate respiratory distress Monitor noted Shallow breath sounds Mouth open Summary use of accessory muscles of respiration Eyes open, awake but less alert than at the time of initial consult on 08-11-2023  Vital Signs: BP (!) 115/43   Pulse 68   Temp 98.2 F (36.8 C) (Axillary)   Resp 13   Ht 5\' 2"  (1.575 m)   Wt 73 kg   SpO2 99%   BMI 29.44 kg/m  SpO2: SpO2: 99 % O2 Device: O2  Device: High Flow Nasal Cannula O2 Flow Rate: O2 Flow Rate (L/min): 5 L/min  Intake/output summary:  Intake/Output Summary (Last 24 hours) at 08/12/2023 1037 Last data filed at 08/12/2023 0600 Gross per 24 hour  Intake 807.58 ml  Output 360 ml  Net 447.58 ml   LBM: Last BM Date : 08/11/23 Baseline Weight: Weight: 69.5 kg Most recent weight: Weight: 73 kg       Palliative Assessment/Data:      Patient Active Problem List   Diagnosis Date Noted   Typical atrial flutter (HCC) 08/06/2023   Enterococcus faecalis infection 08/05/2023   Bacteremia 08/05/2023   Hypotension 08/02/2023   Acute on chronic respiratory failure with hypercapnia (HCC) 08/01/2023   HCAP (healthcare-associated pneumonia) 08/01/2023   PAF (paroxysmal atrial fibrillation) (HCC) 07/20/2023   Acute heart failure with preserved ejection fraction (HFpEF) (HCC) 07/15/2023   RVF (right ventricular failure) (HCC) 07/15/2023   Atrial flutter, paroxysmal (HCC) 07/15/2023   Hx of CABG 07/15/2023   Anasarca 07/12/2023   Acute exacerbation of CHF (congestive heart failure) (HCC) 07/12/2023   Acute on chronic diastolic heart failure (HCC) 07/22/2022   Arthritis 04/09/2021   COPD (chronic obstructive pulmonary disease) (HCC) 04/09/2021   Depression 04/09/2021   History of bronchitis 04/09/2021   History of pneumonia 04/09/2021   lung ca 04/09/2021   Personal history of radiation therapy 04/09/2021   Stress incontinence 04/09/2021   Uterine cancer (HCC) 04/09/2021    Vertigo 07/08/2020   Aortic atherosclerosis (HCC) 02/09/2018   Adrenal cortical adenoma, 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   Adrenal adenoma, left 08/19/2017   Hepatic steatosis 08/19/2017   Malignant neoplasm of upper-outer quadrant of left breast in female, estrogen receptor positive (HCC) 11/27/2015   Primary cancer of right upper lobe of lung (HCC) 11/27/2015   Hypothyroidism 10/22/2015   Osteoporosis 10/22/2015   Diabetes mellitus without complication (HCC) 10/22/2015   Full dentures 10/22/2015   Lung nodule 09/19/2015   Coronary artery disease    Hyperlipidemia    Hypertension    Myocardial infarction [facility]) 02/1994    Palliative Care Assessment & Plan   Patient Profile:    Assessment:  Patient is a 77 year old female with CAD, CABG in 36, COPD, HTN, HLP, hypothyroidism, diabetes mellitus, chronic respiratory failure on 3 L, O2 BNC, history of breast CVA, lung CA had presented with altered mental status and shortness of breath.   Patient had been recently admitted 11/25 to 12/6 with anasarca, CHF exacerbation, A-fib with RVR.  Patient was treated with diuresis, underwent TEE/DCCV for A-fib.  She was discharged to SNF, presented back with wheezing, shortness of breath and altered mental status.  Initially noted to be in hypercapnic respiratory failure and was placed on BiPAP. CT suggested possible pneumonia and was placed on vancomycin and cefepime for HCAP.  Blood cultures grew GPC/Enterococcus faecalis, transition to Unasyn per ID recommendations.  Cardiology was also consulted.  2D echo showed preserved EF without vegetation. ID recommended ampicillin IV every 24 hours with end date of 08/16/2023.   On 12/20, patient noted to be hypotensive, torsemide, Cardizem held.  FOBT positive.  GI was consulted.  Patient continued to have more GI bleeding. Dr Marland Mcalpine discussed with gastroenterology, Dr. Dulce Sellar recommend CT angio of abdomen and pelvis, no plan for colonoscopy.   Patient remains admitted for  acute respiratory failure with hypoxia, hypercapnia, HCAP, acute on chronic HFpEF, pulmonary hypertension, RV failure/dilation, history of CAD, CABG 1996, bilateral pleural effusion.  Also with FOBT +, lower GI bleed and ABLA.  Has persistent hypotension.    Palliative consult for code status and goals of care discussions.   Recommendations/Plan: Goals of care discussions undertaken with the patient's daughter Venus who has arrived from out of town.  I discussed with her about scope of initial palliative consultation that was done on 08-11-2023.  Discussed with her about patient's wishes for DNR/DNI, discussed with her that palliative medicine team as well as hospital attending discussed with the patient's son Tammy Sours as well.  Daughter states that the patient's husband is "taking this very hard", overall, patient's family is aware of patient's current condition and that there remains a high risk for ongoing decline and decompensation.  Plan is to continue scope of current care for the next 24 hours, patient's husband might likely try to visit with her.  After this time trial of current therapies for the next 24 hours, we might proceed with full scope of comfort measures and we might proceed with hospice evaluation for the patient to be transferred to local residential hospice.  Today, discussed with patient's daughter Venus about hospice philosophy of care, specifically the type of care that can be provided in a residential hospice setting.  All of her questions addressed to the best of my ability.  Palliative team to continue to follow.  Code Status:    Code Status Orders  (From admission, onward)           Start     Ordered   08/11/23 1419  Do not attempt resuscitation (DNR)- Limited -Do Not Intubate (DNI)  (Code Status)  Continuous       Question Answer Comment  If pulseless and not breathing No CPR or chest compressions.   In Pre-Arrest Conditions (Patient Is Breathing and Has A Pulse) Do  not intubate. Provide all appropriate non-invasive medical interventions. Avoid ICU transfer unless indicated or required.   Consent: Discussion documented in EHR or advanced directives reviewed      08/11/23 1418           Code Status History     Date Active Date Inactive Code Status Order ID Comments User Context   08/01/2023 1945 08/11/2023 1418 Full Code [id number]  Gery Pray, MD ED   07/12/2023 2119 07/23/2023 2059 Full Code [id number]  Dolly Rias, MD Inpatient       Prognosis:  < 2 weeks  Discharge Planning: Hospice facility potentially  Care plan was discussed with daughter Venus present at bedside  Thank you for allowing the Palliative Medicine Team to assist in the care of this patient.  MOD MDM Addendum: Inter disciplinary discussions with cardiology, RN and hospital medicine held. Discussed again with daughter. Proceed with comfort care Liberalize IV Fentanyl PRN Consider transfer to residential hospice on 08-13-23     Greater than 50%  of this time was spent counseling and coordinating care related to the above assessment and plan.  Rosalin Hawking, MD  Please contact Palliative Medicine Team phone at [phone] for questions and concerns.

## 2023-08-12 NOTE — Progress Notes (Signed)
[facility]                                                                              Denise Mckenzie, is a 77 y.o. female, DOB - 1946/04/06, ZOX:[id number] Admit date - 08/01/2023    Outpatient Primary MD for the patient is Burton Apley, MD  LOS - 11  days  Chief Complaint  Patient presents with   Shortness of Breath   Altered Mental Status       Brief summary   Patient is a 77 year old female with CAD, CABG in 47, COPD, HTN, HLP, hypothyroidism, diabetes mellitus, chronic respiratory failure on 3 L, O2 BNC, history of breast CVA, lung CA had presented with altered mental status and shortness of breath.   Patient had been recently admitted 11/25 to 12/6 with anasarca, CHF exacerbation, A-fib with RVR.  Patient was treated with diuresis, underwent TEE/DCCV for A-fib.  She was discharged to SNF, presented back with wheezing, shortness of breath and altered mental status.  Initially noted to be in hypercapnic respiratory failure and was placed on BiPAP. CT suggested possible pneumonia and was placed on vancomycin and cefepime for HCAP.  Blood cultures grew GPC/Enterococcus faecalis, transition to Unasyn per ID recommendations.  Cardiology was also consulted.  2D echo showed preserved EF without vegetation. ID recommended ampicillin IV every 24 hours with end date of 08/16/2023.  On 12/20, patient noted to be hypotensive, torsemide, Cardizem held.  FOBT positive.  GI was consulted.  Patient continued to have more GI bleeding. Dr Marland Mcalpine discussed with gastroenterology, Dr. Dulce Sellar recommend CT angio of abdomen and pelvis, no plan for colonoscopy.  Assessment & Plan    Principal Problem: Enterococcus faecalis bacteremia --Blood cultures showed GPC/Enterococcus faecalis. -2D echo showed preserved EF 65%, enlarged RV without vegetation. -ID was consulted, recommended ampicillin IV 2 g every 4 hours, stop date on 08/16/2023 -COVID, flu, RSV, urine strep antigen  negative --No plans for TEE -Respiratory status remains tenuous, no significant improvement in the last 48 to 72 hours  Active Problems: Acute respiratory failure with hypoxia, hypercapnia, HCAP Acute on chronic HFpEF, pulmonary hypertension, RV failure/dilation History of CAD, CABG 1996 Bilateral pleural effusion -Outpatient on torsemide 40 mg twice daily -Patient remains significantly volume overloaded, was placed on high-dose IV Lasix with IV albumin and maxed midodrine 15 mg 3 times daily with no significant output. -  Not a candidate for advanced heart failure therapies.  CTA chest negative for PE. -Palliative medicine and cardiology recommendations, discussed with patient's daughter at the bedside, overall poor prognosis, per her wishes plan for comfort care. -no significant improvement in the last 72 hours with IV Lasix diuresis.  Per cardiology, has end-stage RV failure, unfortunately no therapies at this point can reverse this, recommended comfort care.  FOBT positive, lower GI bleed, ABLA -Anemia panel showed iron 44, UIBC 88, TIBC 132, ferritin 185, folate 6.5, B12 350 -Previous attending, Dr. Marland Mcalpine discussed with GI, Dr. Dulce Sellar, recommended CTA abdomen pelvis for any active GI bleeding -Anticoagulation held -Proceed with comfort care per family's wishes.  Persistent hypotension - Likely in the setting of infection, antihypertensives, diuretics -Patient was  placed on midodrine, received IV albumin for BP support.  Paroxysmal atrial fibrillation status post cardioversion on 07/06/2023 -Patient was placed on amiodarone, digoxin, as needed metoprolol -Heart rate controlled, remains in flutter -Eliquis held due to acute anemia, GI bleed -Digoxin stopped  History of right upper lobe lung cancer History of breast cancer -Both in remission  Acute Urinary Retention -Has been wheelchair-bound basically since a few months ago -Continue Foley catheter   Hyperlipidemia -Placed  on comfort care status   Hypothyroidism -Continue Synthroid  Thrombocytopenia, acute -Likely due to bleeding and acute illness -Plt stable  Pressure injury documentation Sacrum left deep tissue pressure injury, not POA -Wound care per nursing   Nutrition Problem: Inadequate oral intake Etiology: decreased appetite, other (see comment) (dislike of available food) Signs/Symptoms: energy intake < or equal to 50% for > or equal to 5 days Interventions: Refer to RD note for recommendations, Magic cup, MVI, Liberalize Diet  Estimated body mass index is 29.44 kg/m as calculated from the following:   Height as of this encounter: 5\' 2"  (1.575 m).   Weight as of this encounter: 73 kg.  Code Status: Full CODE STATUS DVT Prophylaxis:  SCDs   Level of Care: Level of care: Stepdown Family Communication: Updated patient's son, [name] on the phone yesterday on 12/25, updated patient's daughter, Donnita Falls at the bedside today 12/23 Disposition Plan:      Remains inpatient appropriate:   Plan for comfort care and residential hospice   Procedures:    Consultants:   Infectious diseases Cardiology  Antimicrobials:   Anti-infectives (From admission, onward)    Start     Dose/Rate Route Frequency Ordered Stop   08/04/23 1200  ampicillin (OMNIPEN) 2 g in sodium chloride 0.9 % 100 mL IVPB  Status:  Discontinued        2 g 300 mL/hr over 20 Minutes Intravenous Every 4 hours 08/04/23 1022 08/12/23 1156   08/03/23 0600  Ampicillin-Sulbactam (UNASYN) 3 g in sodium chloride 0.9 % 100 mL IVPB  Status:  Discontinued        3 g 200 mL/hr over 30 Minutes Intravenous Every 6 hours 08/02/23 2139 08/04/23 1022   08/02/23 1900  vancomycin (VANCOCIN) IVPB 1000 mg/200 mL premix  Status:  Discontinued        1,000 mg 200 mL/hr over 60 Minutes Intravenous Every 24 hours 08/01/23 2028 08/02/23 1325   08/02/23 0600  piperacillin-tazobactam (ZOSYN) IVPB 3.375 g  Status:  Discontinued        3.375  g 12.5 mL/hr over 240 Minutes Intravenous Every 8 hours 08/01/23 2028 08/02/23 2139   08/01/23 1830  Vancomycin (VANCOCIN) 1,500 mg in sodium chloride 0.9 % 500 mL IVPB        1,500 mg 250 mL/hr over 120 Minutes Intravenous  Once 08/01/23 1743 08/02/23 0848   08/01/23 1800  ceFEPIme (MAXIPIME) 2 g in sodium chloride 0.9 % 100 mL IVPB        2 g 200 mL/hr over 30 Minutes Intravenous  Once 08/01/23 1743 08/01/23 1849          Medications  sodium chloride   Intravenous Once   Chlorhexidine Gluconate Cloth  6 each Topical Daily   Gerhardt's butt cream   Topical Daily   hydrocortisone  25 mg Rectal BID   mometasone-formoterol  2 puff Inhalation BID   pantoprazole (PROTONIX) IV  40 mg Intravenous Q12H   sodium chloride flush  10-40 mL Intracatheter Q12H  Subjective:   Denise Mckenzie was seen and examined today.  Patient still significantly volume overloaded, short of breath, minimal UOP.    Objective:   Vitals:   08/12/23 0900 08/12/23 1000 08/12/23 1100 08/12/23 1200  BP: (!) 105/37 (!) 115/43 (!) 112/45 (!) 108/41  Pulse: 63 68 73 67  Resp: 18 13 (!) 21 13  Temp:      TempSrc:      SpO2: 99% 99% 92% 98%  Weight:      Height:        Intake/Output Summary (Last 24 hours) at 08/12/2023 1208 Last data filed at 08/12/2023 0600 Gross per 24 hour  Intake 807.58 ml  Output 145 ml  Net 662.58 ml     Wt Readings from Last 3 Encounters:  08/12/23 73 kg  07/30/23 69.5 kg  07/25/23 69.4 kg    Physical Exam General: Alert and oriented, frail, ill-appearing, increased work of breathing Cardiovascular: S1 S2 clear, RRR.  Respiratory: Diminished breath sounds Gastrointestinal: Soft, nontender, nondistended, NBS Ext: 2+ pitting edema, anasarca, Ace wrap to both legs Neuro: no new deficits Psych: Normal affect     Data Reviewed:  I have personally reviewed following labs    CBC Lab Results  Component Value Date   WBC 11.9 (H) 08/12/2023   RBC 2.49 (L) 08/12/2023    HGB 7.2 (L) 08/12/2023   HCT 24.5 (L) 08/12/2023   MCV 98.4 08/12/2023   MCH 28.9 08/12/2023   PLT 103 (L) 08/12/2023   MCHC 29.4 (L) 08/12/2023   RDW 17.4 (H) 08/12/2023   LYMPHSABS 0.3 (L) 08/08/2023   MONOABS 0.6 08/08/2023   EOSABS 0.2 08/08/2023   BASOSABS 0.0 08/08/2023     Last metabolic panel Lab Results  Component Value Date   NA 139 08/12/2023   K 3.9 08/12/2023   CL 97 (L) 08/12/2023   CO2 36 (H) 08/12/2023   BUN 30 (H) 08/12/2023   CREATININE 0.60 08/12/2023   GLUCOSE 124 (H) 08/12/2023   GFRNONAA >60 08/12/2023   GFRAA >60 07/06/2019   CALCIUM 8.1 (L) 08/12/2023   PHOS 2.4 (L) 08/08/2023   PROT 4.4 (L) 08/11/2023   ALBUMIN 2.8 (L) 08/11/2023   LABGLOB 2.8 04/16/2021   AGRATIO 1.5 04/16/2021   BILITOT 0.4 08/11/2023   ALKPHOS 61 08/11/2023   AST 28 08/11/2023   ALT 32 08/11/2023   ANIONGAP 6 08/12/2023    CBG (last 3)  Recent Labs    08/11/23 2309 08/12/23 0419 08/12/23 0715  GLUCAP 120* 124* 108*      Coagulation Profile: No results for input(s): "INR", "PROTIME" in the last 168 hours.   Radiology Studies: I have personally reviewed the imaging studies  CT Angio Chest Pulmonary Embolism (PE) W or WO Contrast Result Date: 08/10/2023 CLINICAL DATA:  PE suspected, positive D-dimer history of lung cancer * Tracking Code: BO * EXAM: CT ANGIOGRAPHY CHEST WITH CONTRAST TECHNIQUE: Multidetector CT imaging of the chest was performed using the standard protocol during bolus administration of intravenous contrast. Multiplanar CT image reconstructions and MIPs were obtained to evaluate the vascular anatomy. RADIATION DOSE REDUCTION: This exam was performed according to the departmental dose-optimization program which includes automated exposure control, adjustment of the mA and/or kV according to patient size and/or use of iterative reconstruction technique. CONTRAST:  OMNIPAQUE IOHEXOL 350 MG/ML SOLN COMPARISON:  08/01/2023 FINDINGS: Cardiovascular:  Satisfactory opacification of the pulmonary arteries to the segmental level. No evidence of pulmonary embolism. Cardiomegaly. Left and right coronary artery  calcifications enlargement of the main pulmonary artery measuring up to 3.6 cm. No pericardial effusion. Aortic atherosclerosis. Mediastinum/Nodes: No enlarged mediastinal, hilar, or axillary lymph nodes. Thyroid gland, trachea, and esophagus demonstrate no significant findings. Lungs/Pleura: Small right, moderate left pleural effusions and associated atelectasis or consolidation, similar in volume to prior examination. Severe emphysema. Unchanged, treated mass in the medial anterior right upper lobe with associated biopsy marking and fiducial markers (series 10, image 67). Upper Abdomen: Moderate volume ascites, similar to prior examination. Gallstone. Musculoskeletal: Evidence of left lumpectomy. No acute osseous findings. Review of the MIP images confirms the above findings. IMPRESSION: 1. Negative examination for pulmonary embolism. 2. Small right, moderate left pleural effusions and associated atelectasis or consolidation, similar in volume to prior examination. 3. Unchanged, treated mass in the medial anterior right upper lobe with associated biopsy marking and fiducial markers. 4. Moderate volume ascites, similar to prior examination. 5. Cardiomegaly and coronary artery disease. 6. Severe emphysema. Aortic Atherosclerosis (ICD10-I70.0) and Emphysema (ICD10-J43.9). Electronically Signed   By: Jearld Lesch M.D.   On: 08/10/2023 17:32       Briannon Boggio M.D. [facility] 08/12/2023, 12:08 PM  Available via Epic secure chat 7am-7pm After 7 pm, please refer to night coverage provider listed on amion.

## 2023-08-12 NOTE — TOC Progression Note (Signed)
Transition of Care [facility]) - Progression Note    Patient Details  Name: Denise Mckenzie MRN: [id number] Date of Birth: 1945-09-08  Transition of [facility] CM/SW Contact  Amada Jupiter, LCSW Phone Number: 08/12/2023, 3:42 PM  Clinical Narrative:     Received TOC referral for residential hospice today.  Met with pt's daughter, Venus, who confirms plan and chooses [facility].  Referral placed and they will review for eligibility.   Expected Discharge Plan: Skilled Nursing Facility Barriers to Discharge: No Barriers Identified  Expected Discharge Plan and Services     Post Acute Care Choice: Skilled Nursing Facility Living arrangements for the past 2 months: Skilled Nursing Facility                                       Social Determinants of Health (SDOH) Interventions SDOH Screenings   Food Insecurity: No Food Insecurity (08/02/2023)  Recent Concern: Food Insecurity - Food Insecurity Present (07/13/2023)  Housing: Low Risk  (08/02/2023)  Transportation Needs: No Transportation Needs (08/02/2023)  Utilities: Not At Risk (08/02/2023)  Tobacco Use: Medium Risk (08/01/2023)    Readmission Risk Interventions     No data to display

## 2023-08-12 NOTE — Plan of Care (Signed)
  Problem: Coping: Goal: Ability to adjust to condition or change in health will improve 08/12/2023 1928 by Eddie Candle, RN Outcome: Progressing 08/12/2023 1928 by Eddie Candle, RN Outcome: Progressing   Problem: Fluid Volume: Goal: Ability to maintain a balanced intake and output will improve 08/12/2023 1928 by Eddie Candle, RN Outcome: Progressing 08/12/2023 1928 by Eddie Candle, RN Outcome: Progressing

## 2023-08-12 NOTE — Plan of Care (Signed)
  Problem: Education: Goal: Ability to describe self-care measures that may prevent or decrease complications (Diabetes Survival Skills Education) will improve Outcome: Progressing Goal: Individualized Educational Video(s) Outcome: Progressing   Problem: Coping: Goal: Ability to adjust to condition or change in health will improve Outcome: Progressing   

## 2023-08-13 DIAGNOSIS — J9622 Acute and chronic respiratory failure with hypercapnia: Secondary | ICD-10-CM | POA: Diagnosis not present

## 2023-08-13 DIAGNOSIS — Z515 Encounter for palliative care: Secondary | ICD-10-CM | POA: Diagnosis not present

## 2023-08-13 DIAGNOSIS — I9589 Other hypotension: Secondary | ICD-10-CM | POA: Diagnosis not present

## 2023-08-13 DIAGNOSIS — C3411 Malignant neoplasm of upper lobe, right bronchus or lung: Secondary | ICD-10-CM | POA: Diagnosis not present

## 2023-08-13 MED ORDER — HYDROMORPHONE BOLUS VIA INFUSION
0.5000 mg | INTRAVENOUS | Status: DC | PRN
  Administered 2023-08-13: 0.5 mg via INTRAVENOUS
  Filled 2023-08-13: qty 1

## 2023-08-13 MED ORDER — HYDROMORPHONE HCL-NACL 50-0.9 MG/50ML-% IV SOLN
0.5000 mg/h | INTRAVENOUS | Status: DC
  Administered 2023-08-13: 0.5 mg/h via INTRAVENOUS
  Filled 2023-08-13: qty 50

## 2023-08-13 NOTE — Progress Notes (Signed)
Chaplain provided support for Tatum's daughter, Venus, and a very close family friend, Museum/gallery exhibitions officer. They are at bedside, lovingly caring for her.  Chaplain provided grief support and prayer, at their request.    Kathleen Argue, Bcc Pager, [phone]

## 2023-08-13 NOTE — Progress Notes (Signed)
Daily Progress Note   Patient Name: Denise Mckenzie       Date: 08/13/2023 DOB: Jan 06, 1946  Age: 77 y.o. MRN#: [id number] Attending Physician: Almon Hercules, MD Primary Care Physician: Burton Apley, MD Admit Date: 08/01/2023  Reason for Consultation/Follow-up: Establishing goals of care  Subjective: Mouth open, not alert, daughter from [place] at bedside, monitor noted, actively dying.   Length of Stay: 12  Current Medications: Scheduled Meds:   Chlorhexidine Gluconate Cloth  6 each Topical Daily   Gerhardt's butt cream   Topical Daily   pantoprazole (PROTONIX) IV  40 mg Intravenous Q12H   sodium chloride flush  10-40 mL Intracatheter Q12H    Continuous Infusions:  HYDROmorphone      PRN Meds: acetaminophen **OR** acetaminophen, antiseptic oral rinse, fentaNYL (SUBLIMAZE) injection, glycopyrrolate, HYDROmorphone **AND** HYDROmorphone, lip balm, LORazepam, ondansetron **OR** ondansetron (ZOFRAN) IV, mouth rinse, sodium chloride flush, sodium chloride flush, traMADol, traZODone  Physical Exam           Shallow breath sounds Mouth open some use of accessory muscles of respiration    Vital Signs: BP (!) 108/41   Pulse 71   Temp 98.2 F (36.8 C) (Axillary)   Resp 14   Ht 5\' 2"  (1.575 m)   Wt 73 kg   SpO2 96%   BMI 29.44 kg/m  SpO2: SpO2: 96 % O2 Device: O2 Device: Nasal Cannula O2 Flow Rate: O2 Flow Rate (L/min): 4 L/min  Intake/output summary: No intake or output data in the 24 hours ending 08/13/23 1215  LBM: Last BM Date : 08/13/23 Baseline Weight: Weight: 69.5 kg Most recent weight: Weight: 73 kg       Palliative Assessment/Data:      Patient Active Problem List   Diagnosis Date Noted   Typical atrial flutter (HCC) 08/06/2023   Enterococcus faecalis  infection 08/05/2023   Bacteremia 08/05/2023   Hypotension 08/02/2023   Acute on chronic respiratory failure with hypercapnia (HCC) 08/01/2023   HCAP (healthcare-associated pneumonia) 08/01/2023   PAF (paroxysmal atrial fibrillation) (HCC) 07/20/2023   Acute heart failure with preserved ejection fraction (HFpEF) (HCC) 07/15/2023   RVF (right ventricular failure) (HCC) 07/15/2023   Atrial flutter, paroxysmal (HCC) 07/15/2023   Hx of CABG 07/15/2023   Anasarca 07/12/2023   Acute exacerbation of CHF (congestive heart failure) (HCC) 07/12/2023  Acute on chronic diastolic heart failure (HCC) 07/22/2022   Arthritis 04/09/2021   COPD (chronic obstructive pulmonary disease) (HCC) 04/09/2021   Depression 04/09/2021   History of bronchitis 04/09/2021   History of pneumonia 04/09/2021   lung ca 04/09/2021   Personal history of radiation therapy 04/09/2021   Stress incontinence 04/09/2021   Uterine cancer (HCC) 04/09/2021   Vertigo 07/08/2020   Aortic atherosclerosis (HCC) 02/09/2018   Adrenal cortical adenoma, 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   Adrenal adenoma, left 08/19/2017   Hepatic steatosis 08/19/2017   Malignant neoplasm of upper-outer quadrant of left breast in female, estrogen receptor positive (HCC) 11/27/2015   Primary cancer of right upper lobe of lung (HCC) 11/27/2015   Hypothyroidism 10/22/2015   Osteoporosis 10/22/2015   Diabetes mellitus without complication (HCC) 10/22/2015   Full dentures 10/22/2015   Lung nodule 09/19/2015   Coronary artery disease    Hyperlipidemia    Hypertension    Myocardial infarction [facility]) 02/1994    Palliative Care Assessment & Plan   Patient Profile:    Assessment:  Patient is a 77 year old female with CAD, CABG in 53, COPD, HTN, HLP, hypothyroidism, diabetes mellitus, chronic respiratory failure on 3 L, O2 BNC, history of breast CVA, lung CA had presented with altered mental status and shortness of breath.   Patient had been recently admitted 11/25 to  12/6 with anasarca, CHF exacerbation, A-fib with RVR.  Patient was treated with diuresis, underwent TEE/DCCV for A-fib.  She was discharged to SNF, presented back with wheezing, shortness of breath and altered mental status.  Initially noted to be in hypercapnic respiratory failure and was placed on BiPAP. CT suggested possible pneumonia and was placed on vancomycin and cefepime for HCAP.  Blood cultures grew GPC/Enterococcus faecalis, transition to Unasyn per ID recommendations.  Cardiology was also consulted.  2D echo showed preserved EF without vegetation. ID recommended ampicillin IV every 24 hours with end date of 08/16/2023.   On 12/20, patient noted to be hypotensive, torsemide, Cardizem held.  FOBT positive.  GI was consulted.  Patient continued to have more GI bleeding. Dr Marland Mcalpine discussed with gastroenterology, Dr. Dulce Sellar recommend CT angio of abdomen and pelvis, no plan for colonoscopy.   Patient remains admitted for acute respiratory failure with hypoxia, hypercapnia, HCAP, acute on chronic HFpEF, pulmonary hypertension, RV failure/dilation, history of CAD, CABG 1996, bilateral pleural effusion.  Also with FOBT +, lower GI bleed and ABLA.  Has persistent hypotension.    Palliative consult for code status and goals of care discussions.   Recommendations/Plan: Comfort care Patient is actively dying No transfer to hospice house, anticipate hospital death Add low dose Hydromorphone infusion for comfort Also has Ativan IV PRN and Robinul PRN  Appreciate chaplain support. Code Status:    Code Status Orders  (From admission, onward)           Start     Ordered   08/11/23 1419  Do not attempt resuscitation (DNR)- Limited -Do Not Intubate (DNI)  (Code Status)  Continuous       Question Answer Comment  If pulseless and not breathing No CPR or chest compressions.   In Pre-Arrest Conditions (Patient Is Breathing and Has A Pulse) Do not intubate. Provide all appropriate non-invasive  medical interventions. Avoid ICU transfer unless indicated or required.   Consent: Discussion documented in EHR or advanced directives reviewed      08/11/23 1418           Code Status History     Date  Active Date Inactive Code Status Order ID Comments User Context   08/01/2023 1945 08/11/2023 1418 Full Code [id number]  Gery Pray, MD ED   07/12/2023 2119 07/23/2023 2059 Full Code [id number]  Dolly Rias, MD Inpatient       Prognosis:  < 2 weeks  Discharge Planning: Anticipated Hospital Death    Care plan was discussed with daughter Venus present at bedside  Thank you for allowing the Palliative Medicine Team to assist in the care of this patient.  MOD MDM      Greater than 50%  of this time was spent counseling and coordinating care related to the above assessment and plan.  Rosalin Hawking, MD  Please contact Palliative Medicine Team phone at [phone] for questions and concerns.

## 2023-08-13 NOTE — TOC Progression Note (Signed)
Transition of Care [facility]) - Progression Note    Patient Details  Name: Denise Mckenzie MRN: [id number] Date of Birth: [birth year]  [facility] CM/SW Contact  Adrian Prows, RN Phone Number: 08/13/2023, 11:27 AM  Clinical Narrative:    Notified pt's condition has declined; her family prefers pt not be moved to residential hospice bed; [facility] Liaison fo [facility] notified; TOC is following.   Expected Discharge Plan: Skilled Nursing Facility Barriers to Discharge: No Barriers Identified  Expected Discharge Plan and Services     Post Acute Care Choice: Skilled Nursing Facility Living arrangements for the past 2 months: Skilled Nursing Facility                                       Social Determinants of Health (SDOH) Interventions SDOH Screenings   Food Insecurity: No Food Insecurity (08/02/2023)  Recent Concern: Food Insecurity - Food Insecurity Present (07/13/2023)  Housing: Low Risk  (08/02/2023)  Transportation Needs: No Transportation Needs (08/02/2023)  Utilities: Not At Risk (08/02/2023)  Tobacco Use: Medium Risk (08/01/2023)    Readmission Risk Interventions     No data to display

## 2023-08-13 NOTE — Progress Notes (Signed)
PROGRESS NOTE  Denise Mckenzie ZOX:[id number] DOB: 02/17/1946   PCP: Burton Apley, MD  Patient is from: SNF  DOA: 08/01/2023 LOS: 12  Chief complaints Chief Complaint  Patient presents with   Shortness of Breath   Altered Mental Status     Brief Narrative / Interim history: 77 year old F with PMH of CAD/CABG in 1996, COPD, chronic hypoxic RF on 3 L, HTN, HLP, hypothyroidism, diabetes mellitus, CVA, breast cancer, lung cancer and recent hospitalization from 11/25-12/6 with anasarca, CHF and A-fib with RVR returned to ED with altered mental status, shortness of breath and wheezing, and admitted with hypercapnic respiratory failure.  She was placed on BiPAP.  Initially noted to be in hypercapnic respiratory failure and was placed on BiPAP.  CT suggests possible pneumonia and she was placed on vancomycin and cefepime for HCAP.  Blood culture grew Enterococcus faecalis.  She was transitioned to IV Unasyn per recommendation by ID.  TTE with preserved EF and no vegetation.    On 08/06/2023, patient became hypotensive.  Hemoccult positive.  Diuretics and Cardizem discontinued.  Cardiology and GI consulted.  GI did not feel she is a good candidate for colonoscopy, and recommended CT angio abdomen and pelvis if she has overt bleeding.  Cardiology did not feel she is a good candidate for TEE.  Palliative medicine consulted.  Eventually, patient was transition to full comfort care on 08/12/2023.   Subjective: Seen and examined earlier this morning.  No major events overnight of this morning.  Patient's daughter at bedside.  Objective: Vitals:   08/13/23 0900 08/13/23 1000 08/13/23 1100 08/13/23 1200  BP:      Pulse: 68 69 69 71  Resp: 13 14 13 14   Temp:      TempSrc:      SpO2: 99% 96% 97% 96%  Weight:      Height:        Examination:  GENERAL: Appears frail.  No apparent distress. NECK: Supple.  No apparent JVD.  RESP:  No IWOB.   CVS:  RRR. Heart sounds normal.  NEURO: Somnolent  and sleepy. PSYCH: Calm.  No stress of agitation.  Assessment and plan: End-of-life care/full comfort care -Appreciate help by palliative medicine  Enterococcus faecalis bacteremia: Blood culture with Enterococcus faecalis.  TTE without vegetation.  TEE deferred.  Now full comfort care.  Acute respiratory failure with hypoxia, hypercapnia, HCAP Acute on chronic HFpEF, pulmonary hypertension, RV failure/dilation History of CAD, CABG 1996 Bilateral pleural effusion -Appreciate help by cardiology.  Now full comfort care.   FOBT positive, lower GI bleed, ABLA: Now full comfort care.   Persistent hypotension -Full comfort care   Paroxysmal atrial fibrillation status post cardioversion on 07/06/2023   History of right upper lobe lung cancer History of breast cancer -Both in remission  Acute Urinary Retention -Continue Foley catheter.   Hyperlipidemia   Hypothyroidism   Thrombocytopenia, acute  Inadequate oral intake Body mass index is 29.44 kg/m. Nutrition Problem: Inadequate oral intake Etiology: decreased appetite, other (see comment) (dislike of available food) Signs/Symptoms: energy intake < or equal to 50% for > or equal to 5 days Interventions: Refer to RD note for recommendations, Magic cup, MVI, Liberalize Diet  Pressure skin injury: POA Pressure Injury 08/06/23 Sacrum Left;Upper Deep Tissue Pressure Injury - Purple or maroon localized area of discolored intact skin or blood-filled blister due to damage of underlying soft tissue from pressure and/or shear. purple, painful (Active)  08/06/23 0701  Location: Sacrum  Location Orientation: Left;Upper  Staging: Deep Tissue Pressure Injury - Purple or maroon localized area of discolored intact skin or blood-filled blister due to damage of underlying soft tissue from pressure and/or shear.  Wound Description (Comments): purple, painful  Present on Admission:   Dressing Type Foam - Lift dressing to assess site every shift  08/12/23 2000   DVT prophylaxis:  Patient is full comfort care  Code Status: DNR-comfort Family Communication: Updated patient's daughter at bedside Level of care: Stepdown Status is: Inpatient Remains inpatient appropriate because: End of life care   Final disposition: In-hospital death and death. Consultants:  Infectious disease Gastroenterology Cardiology Palliative medicine   35 minutes with more than 50% spent in reviewing records, counseling patient/family and coordinating care.   Sch Meds:  Scheduled Meds:  Chlorhexidine Gluconate Cloth  6 each Topical Daily   Gerhardt's butt cream   Topical Daily   pantoprazole (PROTONIX) IV  40 mg Intravenous Q12H   sodium chloride flush  10-40 mL Intracatheter Q12H   Continuous Infusions:  HYDROmorphone     PRN Meds:.acetaminophen **OR** acetaminophen, antiseptic oral rinse, fentaNYL (SUBLIMAZE) injection, glycopyrrolate, HYDROmorphone **AND** HYDROmorphone, lip balm, LORazepam, ondansetron **OR** ondansetron (ZOFRAN) IV, mouth rinse, sodium chloride flush, sodium chloride flush, traMADol, traZODone  Antimicrobials: Anti-infectives (From admission, onward)    Start     Dose/Rate Route Frequency Ordered Stop   08/04/23 1200  ampicillin (OMNIPEN) 2 g in sodium chloride 0.9 % 100 mL IVPB  Status:  Discontinued        2 g 300 mL/hr over 20 Minutes Intravenous Every 4 hours 08/04/23 1022 08/12/23 1156   08/03/23 0600  Ampicillin-Sulbactam (UNASYN) 3 g in sodium chloride 0.9 % 100 mL IVPB  Status:  Discontinued        3 g 200 mL/hr over 30 Minutes Intravenous Every 6 hours 08/02/23 2139 08/04/23 1022   08/02/23 1900  vancomycin (VANCOCIN) IVPB 1000 mg/200 mL premix  Status:  Discontinued        1,000 mg 200 mL/hr over 60 Minutes Intravenous Every 24 hours 08/01/23 2028 08/02/23 1325   08/02/23 0600  piperacillin-tazobactam (ZOSYN) IVPB 3.375 g  Status:  Discontinued        3.375 g 12.5 mL/hr over 240 Minutes Intravenous Every 8  hours 08/01/23 2028 08/02/23 2139   08/01/23 1830  Vancomycin (VANCOCIN) 1,500 mg in sodium chloride 0.9 % 500 mL IVPB        1,500 mg 250 mL/hr over 120 Minutes Intravenous  Once 08/01/23 1743 08/02/23 0848   08/01/23 1800  ceFEPIme (MAXIPIME) 2 g in sodium chloride 0.9 % 100 mL IVPB        2 g 200 mL/hr over 30 Minutes Intravenous  Once 08/01/23 1743 08/01/23 1849        I have personally reviewed the following labs and images: CBC: Recent Labs  Lab 08/07/23 0501 08/07/23 1010 08/08/23 0343 08/08/23 0928 08/09/23 0538 08/09/23 0941 08/10/23 0532 08/10/23 0831 08/11/23 0445 08/12/23 0421  WBC 18.8*  --  13.7*  --  13.4*  --  10.5 13.6* 13.5* 11.9*  NEUTROABS 17.2*  --  12.3*  --   --   --   --   --   --   --   HGB 10.1*   < > 9.1*   < > 8.7* 9.0* 6.0* 7.8* 7.9* 7.2*  HCT 33.1*   < > 29.9*   < > 29.1* 29.8* 19.9* 25.2* 25.6* 24.5*  MCV 95.1  --  95.5  --  96.0  --  94.3 96.2 97.0 98.4  PLT 109*  --  102*  --  107*  --  83* 105* 107* 103*   < > = values in this interval not displayed.   BMP &GFR Recent Labs  Lab 08/07/23 0501 08/08/23 0343 08/09/23 0538 08/10/23 0532 08/10/23 0831 08/11/23 0445 08/12/23 0421  NA 140 137 138 148* 142 139 139  K 4.4 4.2 4.0 2.8* 3.6 3.4* 3.9  CL 95* 94* 95* 104 98 96* 97*  CO2 39* 36* 35* 32 37* 39* 36*  GLUCOSE 132* 88 85 81 93 121* 124*  BUN 28* 26* 23 19 23 22  30*  CREATININE 0.65 0.60 0.42* 0.44 0.46 0.47 0.60  CALCIUM 7.7* 7.5* 7.4* 5.9* 7.6* 7.5* 8.1*  MG 2.1 2.0 2.1  --   --  2.2 2.3  PHOS 2.6 2.4*  --   --   --   --   --    Estimated Creatinine Clearance: 53.4 mL/min (by C-G formula based on SCr of 0.6 mg/dL). Liver & Pancreas: Recent Labs  Lab 08/06/23 1955 08/07/23 0501 08/08/23 0343 08/11/23 0445  AST 19 21 25 28   ALT 25 31 34 32  ALKPHOS 38 51 55 61  BILITOT 0.9 0.7 0.8 0.4  PROT 3.2* 3.9* 3.9* 4.4*  ALBUMIN 1.8* 2.2* 2.0* 2.8*   No results for input(s): "LIPASE", "AMYLASE" in the last 168 hours. No  results for input(s): "AMMONIA" in the last 168 hours. Diabetic: No results for input(s): "HGBA1C" in the last 72 hours. Recent Labs  Lab 08/11/23 1509 08/11/23 1931 08/11/23 2309 08/12/23 0419 08/12/23 0715  GLUCAP 118* 97 120* 124* 108*   Cardiac Enzymes: No results for input(s): "CKTOTAL", "CKMB", "CKMBINDEX", "TROPONINI" in the last 168 hours. Recent Labs    02/22/23 1136 07/02/23 1645 07/09/23 1131  PROBNP 5,414* 21,913* 33,547*   Coagulation Profile: No results for input(s): "INR", "PROTIME" in the last 168 hours. Thyroid Function Tests: No results for input(s): "TSH", "T4TOTAL", "FREET4", "T3FREE", "THYROIDAB" in the last 72 hours. Lipid Profile: No results for input(s): "CHOL", "HDL", "LDLCALC", "TRIG", "CHOLHDL", "LDLDIRECT" in the last 72 hours. Anemia Panel: No results for input(s): "VITAMINB12", "FOLATE", "FERRITIN", "TIBC", "IRON", "RETICCTPCT" in the last 72 hours. Urine analysis:    Component Value Date/Time   COLORURINE YELLOW 08/01/2023 1702   APPEARANCEUR CLEAR 08/01/2023 1702   LABSPEC 1.011 08/01/2023 1702   PHURINE 5.0 08/01/2023 1702   GLUCOSEU 150 (A) 08/01/2023 1702   HGBUR NEGATIVE 08/01/2023 1702   BILIRUBINUR NEGATIVE 08/01/2023 1702   KETONESUR NEGATIVE 08/01/2023 1702   PROTEINUR NEGATIVE 08/01/2023 1702   NITRITE NEGATIVE 08/01/2023 1702   LEUKOCYTESUR MODERATE (A) 08/01/2023 1702   Sepsis Labs: Invalid input(s): "PROCALCITONIN", "LACTICIDVEN"  Microbiology: No results found for this or any previous visit (from the past 240 hours).  Radiology Studies: No results found.    Raul Winterhalter T. Honore Wipperfurth [facility]  If 7PM-7AM, please contact night-coverage [email or web address] 08/13/2023, 2:03 PM

## 2023-08-14 DIAGNOSIS — K922 Gastrointestinal hemorrhage, unspecified: Secondary | ICD-10-CM | POA: Insufficient documentation

## 2023-08-14 DIAGNOSIS — J9622 Acute and chronic respiratory failure with hypercapnia: Secondary | ICD-10-CM | POA: Diagnosis not present

## 2023-08-14 DIAGNOSIS — A498 Other bacterial infections of unspecified site: Secondary | ICD-10-CM | POA: Diagnosis not present

## 2023-08-16 LAB — TYPE AND SCREEN
ABO/RH(D): A POS
Antibody Screen: NEGATIVE
Unit division: 0

## 2023-08-16 LAB — BPAM RBC
Blood Product Expiration Date: 202501262359
Unit Type and Rh: 6200

## 2023-08-18 NOTE — Progress Notes (Signed)
The patient is 78 year old female, she expired @ 02:22 on 2023/08/20. She was pronounced  with two nurses Luisa Hart, RN and this Clinical research associate. The hospitallist has been informed, the family was present at her bedside and made aware.

## 2023-08-18 NOTE — Progress Notes (Signed)
    Patient Name: LAKIYA MACCINI           DOB: 10-Aug-1946  MRN: [id number]       OVERNIGHT EVENT    Notified by RN that patient has expired at 0222.  2 RN verified. Patient was comfort care.   Family is at bedside.    Anthoney Harada, DNP, ACNPC- AG [facility]

## 2023-08-18 NOTE — Death Summary Note (Signed)
DEATH SUMMARY   Patient Details  Name: Denise Mckenzie MRN: [id number] DOB: [birth year] WUJ:[id number], Denise Fast, MD Admission/Discharge Information   Admit Date:  08/04/23  Date of Death: Date of Death: August 17, 2023  Time of Death: Time of Death: 0222  Length of Stay: Sep 02, 2023   Principle Cause of death: Gastrointestinal bleeding   Hospital Course: 78 year old F with PMH of CAD/CABG in 08-24-94, COPD, chronic hypoxic RF on 3 L, HTN, HLP, hypothyroidism, diabetes mellitus, CVA, breast cancer, lung cancer and recent hospitalization from 11/25-12/6 with anasarca, CHF and A-fib with RVR returned to ED with altered mental status, shortness of breath and wheezing, and admitted with hypercapnic respiratory failure.  She was placed on BiPAP.  Initially noted to be in hypercapnic respiratory failure and was placed on BiPAP.  CT suggests possible pneumonia and she was placed on vancomycin and cefepime for HCAP.  Blood culture grew Enterococcus faecalis.  She was transitioned to IV Unasyn per recommendation by ID.  TTE with preserved EF and no vegetation.    On 08/06/2023, patient became hypotensive.  Hemoccult positive.  Diuretics and Cardizem discontinued.  Cardiology and GI consulted.  GI did not feel she is a good candidate for colonoscopy, and recommended CT angio abdomen and pelvis if she has overt bleeding.  Cardiology did not feel she is a good candidate for TEE.  Palliative medicine consulted.   Eventually, patient was transition to full comfort care on 2023-08-15, and passed away on August 17, 2023 at 2:22 AM.  Assessment and Plan: End-of-life care/full comfort care -Appreciate help by palliative medicine   Enterococcus faecalis bacteremia: Blood culture with Enterococcus faecalis.  TTE without vegetation.  TEE deferred.   ABLA due to lower GI bleed: For comfort care   Acute respiratory failure with hypoxia, hypercapnia, HCAP Acute on chronic HFpEF, pulmonary hypertension, RV failure/dilation History of  CAD, CABG 1996 Bilateral pleural effusion -Appreciate help by cardiology.    Persistent hypotension -Full comfort care   Paroxysmal atrial fibrillation status post cardioversion on 07/06/2023   History of right upper lobe lung cancer History of breast cancer  Acute Urinary Retention   Hyperlipidemia   Hypothyroidism   Thrombocytopenia, acute   Inadequate oral intake        Procedures: None  Consultations:  Infectious disease Gastroenterology Cardiology Palliative medicine  The results of significant diagnostics from this hospitalization (including imaging, microbiology, ancillary and laboratory) are listed below for reference.   Significant Diagnostic Studies: CT Angio Chest Pulmonary Embolism (PE) W or WO Contrast Result Date: 08/10/2023 CLINICAL DATA:  PE suspected, positive D-dimer history of lung cancer * Tracking Code: BO * EXAM: CT ANGIOGRAPHY CHEST WITH CONTRAST TECHNIQUE: Multidetector CT imaging of the chest was performed using the standard protocol during bolus administration of intravenous contrast. Multiplanar CT image reconstructions and MIPs were obtained to evaluate the vascular anatomy. RADIATION DOSE REDUCTION: This exam was performed according to the departmental dose-optimization program which includes automated exposure control, adjustment of the mA and/or kV according to patient size and/or use of iterative reconstruction technique. CONTRAST:  OMNIPAQUE IOHEXOL 350 MG/ML SOLN COMPARISON:  08-04-23 FINDINGS: Cardiovascular: Satisfactory opacification of the pulmonary arteries to the segmental level. No evidence of pulmonary embolism. Cardiomegaly. Left and right coronary artery calcifications enlargement of the main pulmonary artery measuring up to 3.6 cm. No pericardial effusion. Aortic atherosclerosis. Mediastinum/Nodes: No enlarged mediastinal, hilar, or axillary lymph nodes. Thyroid gland, trachea, and esophagus demonstrate no significant  findings. Lungs/Pleura: Small right, moderate left pleural effusions and associated  atelectasis or consolidation, similar in volume to prior examination. Severe emphysema. Unchanged, treated mass in the medial anterior right upper lobe with associated biopsy marking and fiducial markers (series 10, image 67). Upper Abdomen: Moderate volume ascites, similar to prior examination. Gallstone. Musculoskeletal: Evidence of left lumpectomy. No acute osseous findings. Review of the MIP images confirms the above findings. IMPRESSION: 1. Negative examination for pulmonary embolism. 2. Small right, moderate left pleural effusions and associated atelectasis or consolidation, similar in volume to prior examination. 3. Unchanged, treated mass in the medial anterior right upper lobe with associated biopsy marking and fiducial markers. 4. Moderate volume ascites, similar to prior examination. 5. Cardiomegaly and coronary artery disease. 6. Severe emphysema. Aortic Atherosclerosis (ICD10-I70.0) and Emphysema (ICD10-J43.9). Electronically Signed   By: Jearld Lesch M.D.   On: 08/10/2023 17:32   DG Chest Port 1 View Result Date: 08/09/2023 CLINICAL DATA:  Shortness of breath. EXAM: PORTABLE CHEST 1 VIEW COMPARISON:  Chest radiograph dated 08/08/2023. FINDINGS: Right-sided PICC in similar position. Bilateral mid to lower lung field atelectasis. Pneumonia is not excluded. Trace pleural effusion may be present. No pneumothorax. Stable cardiomediastinal silhouette. Atherosclerotic calcification of the aorta. Median sternotomy wires and CABG vascular clips. No acute osseous pathology. IMPRESSION: Probable small bilateral pleural effusions and bilateral mid to lower lung field atelectasis. Electronically Signed   By: Elgie Collard M.D.   On: 08/09/2023 20:06   DG CHEST PORT 1 VIEW Result Date: 08/08/2023 CLINICAL DATA:  Shortness of breath EXAM: PORTABLE CHEST 1 VIEW COMPARISON:  08/07/2023 FINDINGS: Chronic cardiomegaly.  Opacified retrocardiac lung and hazy density at the right base, unchanged. No new opacity, pulmonary edema, or pneumothorax. Right PICC with tip at the upper right atrium. Right apical opacity with adjacent fiducial markers, somewhat obscured by rotation. Extensive artifact from EKG leads. IMPRESSION: Unchanged left more than right lower chest opacification correlating with pleural fluid and atelectasis by most recent CT. Electronically Signed   By: Tiburcio Pea M.D.   On: 08/08/2023 08:50   DG CHEST PORT 1 VIEW Result Date: 08/07/2023 CLINICAL DATA:  Shortness of breath. EXAM: PORTABLE CHEST 1 VIEW COMPARISON:  Chest radiograph dated 08/06/2023. FINDINGS: The heart is borderline enlarged. Vascular calcifications are seen in the aortic arch. Opacity of the left lung base likely represents a moderate pleural effusion with associated atelectasis/airspace disease. There is likely a trace right pleural effusion with associated atelectasis. No pneumothorax on either side. A right upper extremity peripherally inserted central venous catheter tip overlies the superior cavoatrial junction. Degenerative changes are seen in the spine. IMPRESSION: Moderate left and trace right pleural effusions with associated atelectasis/airspace disease. Electronically Signed   By: Romona Curls M.D.   On: 08/07/2023 10:08   [place] EKG SITE RITE Result Date: 08/06/2023 If Site Rite image not attached, placement could not be confirmed due to current cardiac rhythm.  DG CHEST PORT 1 VIEW Result Date: 08/06/2023 CLINICAL DATA:  Shortness of breath. EXAM: PORTABLE CHEST 1 VIEW COMPARISON:  July 31, 2013. FINDINGS: Stable cardiomediastinal silhouette. Status post coronary bypass graft. Decreased bilateral opacities are noted suggesting improving edema or pneumonia. Bony thorax is unremarkable. IMPRESSION: Decreased bibasilar opacities suggesting improving edema or pneumonia. Electronically Signed   By: Lupita Raider M.D.   On:  08/06/2023 08:32   [place] EKG SITE RITE Result Date: 08/05/2023 If Site Rite image not attached, placement could not be confirmed due to current cardiac rhythm.  ECHOCARDIOGRAM COMPLETE Result Date: 08/03/2023    ECHOCARDIOGRAM REPORT  Patient Name:   Denise Mckenzie Date of Exam: 08/03/2023 Medical Rec #:  [id number]    Height:       62.0 in Accession #:    [id number]   Weight:       153.2 lb Date of Birth:  1945-09-23    BSA:          1.707 m Patient Age:    28 years     BP:           143/43 mmHg Patient Gender: F            HR:           80 bpm. Exam Location:  Inpatient Procedure: 2D Echo, Cardiac Doppler and Color Doppler Indications:    Bacteremia R78.81  History:        Patient has prior history of Echocardiogram examinations, most                 recent 07/16/2023. CHF, CAD, Arrythmias:Atrial Fibrillation and                 Atrial Flutter; Risk Factors:Hypertension and Diabetes.  Sonographer:    Darlys Gales Referring Phys: [phone] CORNELIUS N VAN [place] IMPRESSIONS  1. Left ventricular ejection fraction, by estimation, is 60 to 65%. The left ventricle has normal function. The left ventricle has no regional wall motion abnormalities. Left ventricular diastolic parameters are indeterminate. There is the interventricular septum is flattened in systole, consistent with right ventricular pressure overload.  2. Right ventricular systolic function is severely reduced. The right ventricular size is severely enlarged.  3. Right atrial size was moderately dilated.  4. The mitral valve is normal in structure. No evidence of mitral valve regurgitation. No evidence of mitral stenosis.  5. The aortic valve is tricuspid. There is mild calcification of the aortic valve. Aortic valve regurgitation is not visualized. No aortic stenosis is present.  6. The inferior vena cava is dilated in size with >50% respiratory variability, suggesting right atrial pressure of 8 mmHg. FINDINGS  Left Ventricle: Left ventricular ejection  fraction, by estimation, is 60 to 65%. The left ventricle has normal function. The left ventricle has no regional wall motion abnormalities. The left ventricular internal cavity size was normal in size. There is  no left ventricular hypertrophy. The interventricular septum is flattened in systole, consistent with right ventricular pressure overload. Left ventricular diastolic parameters are indeterminate. Right Ventricle: The right ventricular size is severely enlarged. No increase in right ventricular wall thickness. Right ventricular systolic function is severely reduced. Left Atrium: Left atrial size was normal in size. Right Atrium: Right atrial size was moderately dilated. Pericardium: There is no evidence of pericardial effusion. Mitral Valve: The mitral valve is normal in structure. No evidence of mitral valve regurgitation. No evidence of mitral valve stenosis. Tricuspid Valve: The tricuspid valve is normal in structure. Tricuspid valve regurgitation is mild . No evidence of tricuspid stenosis. Aortic Valve: The aortic valve is tricuspid. There is mild calcification of the aortic valve. Aortic valve regurgitation is not visualized. No aortic stenosis is present. Pulmonic Valve: The pulmonic valve was normal in structure. Pulmonic valve regurgitation is not visualized. No evidence of pulmonic stenosis. Aorta: The aortic root is normal in size and structure. Venous: The inferior vena cava is dilated in size with greater than 50% respiratory variability, suggesting right atrial pressure of 8 mmHg. IAS/Shunts: There is left bowing of the interatrial septum, suggestive of elevated right atrial pressure. No atrial  level shunt detected by color flow Doppler.  LEFT VENTRICLE PLAX 2D LVIDd:         4.50 cm LVIDs:         2.80 cm LV PW:         0.80 cm LV IVS:        0.90 cm LVOT diam:     1.70 cm LVOT Area:     2.27 cm  RIGHT VENTRICLE RV Basal diam:  5.30 cm RV Mid diam:    5.50 cm TAPSE (M-mode): 0.6 cm LEFT  ATRIUM             Index        RIGHT ATRIUM           Index LA Vol (A2C):   44.6 ml 26.13 ml/m  RA Area:     16.00 cm LA Vol (A4C):   43.4 ml 25.42 ml/m  RA Volume:   39.20 ml  22.96 ml/m LA Biplane Vol: 46.6 ml 27.30 ml/m   AORTA Ao Root diam: 2.30 cm TRICUSPID VALVE TR Peak grad:   36.0 mmHg TR Vmax:        300.00 cm/s  SHUNTS Systemic Diam: 1.70 cm Arvilla Meres MD Electronically signed by Arvilla Meres MD Signature Date/Time: 08/03/2023/3:03:11 PM    Final    DG Swallowing Func-Speech Pathology Result Date: 08/03/2023 Table formatting from the original result was not included. Modified Barium Swallow Study Patient Details Name: Denise Mckenzie MRN: [id number] Date of Birth: [birth year] Today's Date: 08/03/2023 HPI/PMH: HPI: Patient is a 78 y.o. female with PMH: CAD, h/o CABG 1996, COPD, HTN, HLD, hypothyroidism, DM-2, chronic respiratory failure on 3L, h/o breast and lung cancer. She was recently admitted 11/25-12/6/24 with anasarca, CHF exacerbation, a-fib with RVR. She was discharged to [facility]. She presented to hospital on 12/15 with SOB, wheezing and altered. In ER, she was  hypoxic at 86% on home 3L oxygen, HR 150. CT chest suggestive of PNA. She was placed on BiPAP. SLP swallow evaluation ordered secondary to patient c/o difficult with swallowing pills and RN observing patient to have instances of coughing when drinking liquids. Clinical Impression: Clinical Impression: Patient presents with a mild oral and pharyngoesophageal dysphagia as per this modified barium swallow study. During oral phase, she exhibited slow mastication and slow lingual movement, leading to delayed bolus transit from anterior to posterior portion of oral cavity. Swallow was initiated at level of vallecular sinus with puree, mechanical soft solids and honey thick liquids, and at posterior laryngeal surface of the epiglottis and at times delayed to pyriform sinus with thin and nectar thick liquids. Anterior hyoid  excursion, laryngeal elevation and epiglottic inversion all appeared complete. No significant amount of pharyngeal residuals remained after swallows and no instances of penetration or aspiration observed at any phase of the swallow. When taken with thin liquids, barium tablet become briefly lodged in vallecular sinus but transited with more sips of liquids and bites of puree. When camera panned down to view esophagus, barium tablet observed to be at upper thoracic level of esophagus. Sips of liquids and bites of puree helped to fully transit pill. No barium stasis observed with other barium consistencies but SLP questions reduced esophageal motility. (no radiologist present to confirm) SLP not recommending further skilled intervention but recommends patient take breaks while eating, alternate solids and liquids and avoid rapid PO consumption. RN notified of results and recommendations. Factors that may increase risk of adverse event in presence of aspiration Rubye Oaks [employer]  Clearance Coots 2021): Factors that may increase risk of adverse event in presence of aspiration Rubye Oaks [employer] Clearance Coots 2021): Limited mobility; Poor general health and/or compromised immunity; Frail or deconditioned; Respiratory or GI disease Recommendations/Plan: Swallowing Evaluation Recommendations Swallowing Evaluation Recommendations Recommendations: PO diet PO Diet Recommendation: Regular; Thin liquids (Level 0) Liquid Administration via: Straw; Cup Medication Administration: Whole meds with liquid Supervision: Patient able to self-feed; Set-up assistance for safety Swallowing strategies  : Minimize environmental distractions; Slow rate Postural changes: Stay upright 30-60 min after meals; Position pt fully upright for meals Oral care recommendations: Oral care BID (2x/day) Treatment Plan Treatment Plan Treatment recommendations: No treatment recommended at this time Follow-up recommendations: No SLP follow up Functional status assessment: Patient has not  had a recent decline in their functional status. Recommendations Recommendations for follow up therapy are one component of a multi-disciplinary discharge planning process, led by the attending physician.  Recommendations may be updated based on patient status, additional functional criteria and insurance authorization. Assessment: Orofacial Exam: Orofacial Exam Oral Cavity - Dentition: Adequate natural dentition Anatomy: Anatomy: WFL Boluses Administered: Boluses Administered Boluses Administered: Thin liquids (Level 0); Mildly thick liquids (Level 2, nectar thick); Moderately thick liquids (Level 3, honey thick); Puree; Solid  Oral Impairment Domain: Oral Impairment Domain Lip Closure: Escape from interlabial space or lateral juncture, no extension beyond vermillion border Tongue control during bolus hold: Not tested Bolus preparation/mastication: Slow prolonged chewing/mashing with complete recollection Bolus transport/lingual motion: Slow tongue motion Oral residue: Trace residue lining oral structures Location of oral residue : Tongue; Floor of mouth Initiation of pharyngeal swallow : Posterior laryngeal surface of the epiglottis; Pyriform sinuses  Pharyngeal Impairment Domain: Pharyngeal Impairment Domain Soft palate elevation: No bolus between soft palate (SP)/pharyngeal wall (PW) Laryngeal elevation: Complete superior movement of thyroid cartilage with complete approximation of arytenoids to epiglottic petiole Anterior hyoid excursion: Complete anterior movement Epiglottic movement: Complete inversion Laryngeal vestibule closure: Complete, no air/contrast in laryngeal vestibule Pharyngeal stripping wave : Present - complete Pharyngeal contraction (A/P view only): N/A Pharyngoesophageal segment opening: Complete distension and complete duration, no obstruction of flow Tongue base retraction: No contrast between tongue base and posterior pharyngeal wall (PPW) Pharyngeal residue: Complete pharyngeal clearance  Location of pharyngeal residue: N/A  Esophageal Impairment Domain: Esophageal Impairment Domain Esophageal clearance upright position: Complete clearance, esophageal coating Pill: Pill Consistency administered: Thin liquids (Level 0); Puree Thin liquids (Level 0): Impaired (see clinical impressions) Puree: Impaired (see clinical impressions) Penetration/Aspiration Scale Score: Penetration/Aspiration Scale Score 1.  Material does not enter airway: Thin liquids (Level 0); Mildly thick liquids (Level 2, nectar thick); Moderately thick liquids (Level 3, honey thick); Puree; Solid; Pill Compensatory Strategies: Compensatory Strategies Compensatory strategies: Yes Straw: Effective Effective Straw: Thin liquid (Level 0) Liquid wash: Effective Effective Liquid Wash: Pill; Moderately thick liquid (Level 3, honey thick)   General Information: No data recorded Diet Prior to this Study: Regular; Thin liquids (Level 0)   Temperature : Normal   Respiratory Status: WFL   Supplemental O2: Nasal cannula   History of Recent Intubation: No  Behavior/Cognition: Alert; Cooperative; Pleasant mood Self-Feeding Abilities: Able to self-feed Baseline vocal quality/speech: Normal Volitional Cough: Able to elicit Volitional Swallow: Able to elicit Exam Limitations: No limitations Goal Planning: No data recorded No data recorded No data recorded Patient/Family Stated Goal: patient reports h/o difficulty with larger pills Consulted and agree with results and recommendations: Patient; Nurse Pain: Pain Assessment Pain Assessment: No/denies pain Pain Score: 0 End of Session: Start Time:SLP Start Time (ACUTE  ONLY): 1120 Stop Time: SLP Stop Time (ACUTE ONLY): 1135 Time Calculation:SLP Time Calculation (min) (ACUTE ONLY): 15 min Charges: SLP Evaluations $ SLP Speech Visit: 1 Visit SLP Evaluations $BSS Swallow: 1 Procedure $MBS Swallow: 1 Procedure SLP visit diagnosis: SLP Visit Diagnosis: Dysphagia, pharyngoesophageal phase (R13.14); Dysphagia, oral  phase (R13.11) Past Medical History: Past Medical History: Diagnosis Date  Acute exacerbation of CHF (congestive heart failure) (HCC) 07/12/2023  Arthritis   Breast cancer (HCC) dx'd 09/2015  COPD (chronic obstructive pulmonary disease) (HCC)   Coronary artery disease   Depression   Diabetes mellitus (HCC) 10/22/2015  Full dentures   full upper dentures, plate on bottom  History of bronchitis   History of pneumonia   Hyperlipidemia   Hypertension   Hypothyroidism 10/22/2015  lung ca dx;d 09/2015  lung cancer  Myocardial infarction [facility]) July 1995  Osteoporosis 10/22/2015  Personal history of radiation therapy   Stress incontinence   Uterine cancer (HCC) dx'd 1968 Past Surgical History: Past Surgical History: Procedure Laterality Date  ABDOMINAL HYSTERECTOMY    1968  APPENDECTOMY    with hysterectomy  BLADDER SURGERY    1972  BREAST BIOPSY Left 11/04/2015  Malignant  BREAST BIOPSY Left 11/04/2015  Malignant  BREAST LUMPECTOMY Left 11/19/2015  CARDIAC CATHETERIZATION  1995, 1996  Dr. Donnie Aho saw here then here at [facility] both times; 'they did a balloon and opened it up' first time, the 2nd was scheduled d/t abnormal stress test  CARDIOVERSION N/A 07/16/2023  Procedure: CARDIOVERSION;  Surgeon: Sande Rives, MD;  Location: [facility] INVASIVE CV LAB;  Service: Cardiovascular;  Laterality: N/A;  COLONOSCOPY W/ POLYPECTOMY    CORONARY ARTERY BYPASS GRAFT    11/1994  feet surgery    1991, screws in both feet to fix deformity from birth  LUNG BIOPSY N/A 11/13/2015  Procedure: RIGHT LUNG BIOPSY;  Surgeon: Delight Ovens, MD;  Location: [facility] OR;  Service: Thoracic;  Laterality: N/A;  MULTIPLE TOOTH EXTRACTIONS    RADIOACTIVE SEED GUIDED PARTIAL MASTECTOMY WITH AXILLARY SENTINEL LYMPH NODE BIOPSY Left 11/19/2015  Procedure: RADIOACTIVE SEED GUIDED PARTIAL MASTECTOMY WITH AXILLARY SENTINEL LYMPH NODE BIOPSY;  Surgeon: Manus Rudd, MD;  Location: [facility];  Service: General;  Laterality: Left;  RE-EXCISION OF BREAST  LUMPECTOMY Left 12/03/2015  Procedure: RE-EXCISION INFERIOR MARGIN OF LEFT BREAST LUMPECTOMY;  Surgeon: Manus Rudd, MD;  Location: [facility] OR;  Service: General;  Laterality: Left;  TMJ ARTHROPLASTY    1979  TONSILLECTOMY    TRANSESOPHAGEAL ECHOCARDIOGRAM (CATH LAB) N/A 07/16/2023  Procedure: TRANSESOPHAGEAL ECHOCARDIOGRAM;  Surgeon: Sande Rives, MD;  Location: [facility] INVASIVE CV LAB;  Service: Cardiovascular;  Laterality: N/A;  VIDEO BRONCHOSCOPY WITH ENDOBRONCHIAL NAVIGATION N/A 11/13/2015  Procedure: VIDEO BRONCHOSCOPY WITH ENDOBRONCHIAL NAVIGATION, with placement of fudicial markers;  Surgeon: Delight Ovens, MD;  Location: [facility] OR;  Service: Thoracic;  Laterality: N/A; Angela Nevin, MA, CCC-SLP Speech Therapy   CT Angio Chest PE W and/or Wo Contrast Result Date: 08/01/2023 CLINICAL DATA:  Pulmonary embolism (PE) suspected, low to intermediate prob, neg D-dimer EXAM: CT ANGIOGRAPHY CHEST WITH CONTRAST TECHNIQUE: Multidetector CT imaging of the chest was performed using the standard protocol during bolus administration of intravenous contrast. Multiplanar CT image reconstructions and MIPs were obtained to evaluate the vascular anatomy. RADIATION DOSE REDUCTION: This exam was performed according to the departmental dose-optimization program which includes automated exposure control, adjustment of the mA and/or kV according to patient size and/or use of iterative reconstruction technique. CONTRAST:  75mL OMNIPAQUE IOHEXOL 350  MG/ML SOLN COMPARISON:  07/13/2023 FINDINGS: Cardiovascular: Satisfactory opacification of the pulmonary arteries. No pulmonary arterial filling defects. Pulmonary arteries are mildly dilated. Thoracic aorta is nonaneurysmal. Atherosclerotic calcifications of the aorta and coronary arteries. Prior CABG. Heart size is upper limits of normal. No pericardial effusion. Mediastinum/Nodes: No enlarged mediastinal, hilar, or axillary lymph nodes. Thyroid gland, trachea, and esophagus  demonstrate no significant findings. Lungs/Pleura: Small layering bilateral pleural effusions. Dependent atelectasis or consolidation of the left lower lobe and to a lesser degree within the right lower lobe and lingula. Chronic scarring within the right upper lobe associated with fiducial markers, unchanged. Background of emphysema. No pneumothorax. Upper Abdomen: Cholelithiasis. Stable bilateral low-attenuation adrenal nodules, previously described as adenomas. These do not require follow-up imaging. Small volume ascites within the upper abdomen. Musculoskeletal: Exaggerated thoracic kyphosis. Prior sternotomy. No new or acute bony findings. No significant chest wall abnormality. Review of the MIP images confirms the above findings. IMPRESSION: 1. No evidence of pulmonary embolism. 2. Small layering bilateral pleural effusions. Dependent atelectasis or consolidation of the left lower lobe and to a lesser degree within the right lower lobe and lingula. 3. Small volume ascites within the upper abdomen. 4. Cholelithiasis. 5. Aortic and coronary artery atherosclerosis (ICD10-I70.0). 6. Emphysema (ICD10-J43.9). Electronically Signed   By: Duanne Guess D.O.   On: 08/01/2023 19:15   CT Head Wo Contrast Result Date: 08/01/2023 CLINICAL DATA:  Mental status change EXAM: CT HEAD WITHOUT CONTRAST TECHNIQUE: Contiguous axial images were obtained from the base of the skull through the vertex without intravenous contrast. RADIATION DOSE REDUCTION: This exam was performed according to the departmental dose-optimization program which includes automated exposure control, adjustment of the mA and/or kV according to patient size and/or use of iterative reconstruction technique. COMPARISON:  CT head 07/12/2023 FINDINGS: Brain: No intracranial hemorrhage, mass effect, or evidence of acute infarct. No hydrocephalus. No extra-axial fluid collection. Age-commensurate cerebral atrophy and chronic small vessel ischemic disease.  Vascular: No hyperdense vessel. Intracranial arterial calcification. Skull: No fracture or focal lesion. Sinuses/Orbits: No acute finding. Other: None. IMPRESSION: No acute intracranial abnormality. Electronically Signed   By: Minerva Fester M.D.   On: 08/01/2023 19:12   DG Chest Port 1 View Result Date: 08/01/2023 CLINICAL DATA:  Shortness of breath, wheezing, low oxygen saturations EXAM: PORTABLE CHEST 1 VIEW COMPARISON:  07/30/2023 FINDINGS: Increased size of the cardiac silhouette compared with 07/30/2023. Aortic atherosclerotic calcification. Sternotomy and CABG increased bibasilar airspace and interstitial opacities. Increased opacities about the surgical clips in the right upper lobe. Layering bilateral pleural effusions. No pneumothorax. IMPRESSION: 1. Increased size of the cardiac silhouette compared with 07/30/2023. This may be in part due to patient rotation however is concerning for pericardial effusion. CT is recommended for further evaluation. 2. Increased bibasilar airspace and interstitial opacities. Pulmonary edema is favored though multifocal pneumonia could appear similarly. 3. Layering bilateral pleural effusions. Electronically Signed   By: Minerva Fester M.D.   On: 08/01/2023 17:43   DG Chest Port 1 View Result Date: 07/30/2023 CLINICAL DATA:  Shortness of breath and COPD EXAM: PORTABLE CHEST 1 VIEW COMPARISON:  Chest x-ray 07/25/2023.  Chest CT 07/13/2023. FINDINGS: Focal parenchymal opacities and postsurgical changes in the right upper lobe have not significantly changed. There is a stable small left pleural effusion and left basilar infiltrates. The cardiomediastinal silhouette is unchanged. Sternotomy wires and mediastinal clips are again noted. No acute osseous abnormalities. IMPRESSION: 1. No significant change in focal parenchymal opacities and postsurgical changes in the right upper  lobe. 2. Stable small left pleural effusion and left basilar infiltrates. Electronically Signed    By: Darliss Cheney M.D.   On: 07/30/2023 23:17   DG Chest Port 1 View Result Date: 07/25/2023 CLINICAL DATA:  Shortness of breath EXAM: PORTABLE CHEST 1 VIEW COMPARISON:  07/12/2023 FINDINGS: Small left pleural effusion. Associated left lower lobe/retrocardiac opacity, favoring atelectasis. Nodular opacity in the right upper lobe with fiducial markers, with associated radiation changes on prior CT. No pneumothorax. The heart is normal in size. Postsurgical changes related to prior CABG. Median sternotomy. IMPRESSION: Small left pleural effusion. Associated left lower lobe/retrocardiac opacity, favoring atelectasis. Nodular opacity in the right upper lobe with fiducial markers, with associated radiation changes on prior CT. Electronically Signed   By: Charline Bills M.D.   On: 07/25/2023 23:58   ECHO TEE Result Date: 07/16/2023    TRANSESOPHOGEAL ECHO REPORT   Patient Name:   Denise Mckenzie Date of Exam: 07/16/2023 Medical Rec #:  [id number]    Height:       62.0 in Accession #:    [id number]   Weight:       154.3 lb Date of Birth:  1945/12/13    BSA:          1.712 m Patient Age:    20 years     BP:           135/71 mmHg Patient Gender: F            HR:           125 bpm. Exam Location:  Inpatient Procedure: Transesophageal Echo, Cardiac Doppler and Color Doppler Indications:     Atrial Fibrillation  History:         Patient has prior history of Echocardiogram examinations, most                  recent 07/13/2023. CHF, CAD and Previous Myocardial Infarction,                  Prior CABG, COPD, Arrythmias:Atrial Flutter and Tachycardia;                  Risk Factors:Hypertension, Diabetes and Dyslipidemia. Breast                  cancer (L) breast.  Sonographer:     Lucendia Herrlich RCS Referring Phys:  [id number] Ronnald Ramp O'NEAL Diagnosing Phys: Lennie Odor MD PROCEDURE: After discussion of the risks and benefits of a TEE, an informed consent was obtained from the patient. TEE procedure time was 8  minutes. The transesophogeal probe was passed without difficulty through the esophogus of the patient. Imaged were  obtained with the patient in a supine position. Sedation performed by different physician. The patient was monitored while under deep sedation. Anesthestetic sedation was provided intravenously by Anesthesiology: 50mg  of Propofol. Image quality was excellent. The patient developed no complications during the procedure. A successful direct current cardioversion was performed at 200 joules with 1 attempt.  IMPRESSIONS  1. Left ventricular ejection fraction, by estimation, is 60 to 65%. The left ventricle has normal function. There is the interventricular septum is flattened in systole and diastole, consistent with right ventricular pressure and volume overload.  2. Right ventricular systolic function is severely reduced. The right ventricular size is severely enlarged.  3. No left atrial/left atrial appendage thrombus was detected. The LAA emptying velocity was 51 cm/s.  4. Right atrial size was severely dilated.  5. The  mitral valve is grossly normal. Trivial mitral valve regurgitation. No evidence of mitral stenosis.  6. The aortic valve is tricuspid. There is mild calcification of the aortic valve. Aortic valve regurgitation is not visualized. No aortic stenosis is present.  7. There is Moderate (Grade III) layered plaque involving the descending aorta. Conclusion(s)/Recommendation(s): No LA/LAA thrombus identified. Successful cardioversion performed with restoration of normal sinus rhythm. FINDINGS  Left Ventricle: Left ventricular ejection fraction, by estimation, is 60 to 65%. The left ventricle has normal function. The left ventricular internal cavity size was small. The interventricular septum is flattened in systole and diastole, consistent with right ventricular pressure and volume overload. Right Ventricle: The right ventricular size is severely enlarged. No increase in right ventricular wall  thickness. Right ventricular systolic function is severely reduced. Left Atrium: Left atrial size was normal in size. No left atrial/left atrial appendage thrombus was detected. The LAA emptying velocity was 51 cm/s. Right Atrium: Right atrial size was severely dilated. Pericardium: There is no evidence of pericardial effusion. Mitral Valve: The mitral valve is grossly normal. Trivial mitral valve regurgitation. No evidence of mitral valve stenosis. Tricuspid Valve: The tricuspid valve is grossly normal. Tricuspid valve regurgitation is mild . No evidence of tricuspid stenosis. Aortic Valve: The aortic valve is tricuspid. There is mild calcification of the aortic valve. Aortic valve regurgitation is not visualized. No aortic stenosis is present. Pulmonic Valve: The pulmonic valve was grossly normal. Pulmonic valve regurgitation is not visualized. No evidence of pulmonic stenosis. Aorta: The aortic root and ascending aorta are structurally normal, with no evidence of dilitation. There is moderate (Grade III) layered plaque involving the descending aorta. Venous: The left upper pulmonary vein is normal. IAS/Shunts: The atrial septum is grossly normal. Additional Comments: Spectral Doppler performed.  AORTA Ao Root diam: 3.71 cm Ao Asc diam:  2.74 cm Lennie Odor MD Electronically signed by Lennie Odor MD Signature Date/Time: 07/16/2023/12:27:53 PM    Final    EP STUDY Result Date: 07/16/2023 See surgical note for result.   Microbiology: No results found for this or any previous visit (from the past 240 hours).  Time spent: 35 minutes  Signed: Almon Hercules, MD 08/05/2023

## 2023-08-18 DEATH — deceased

## 2023-09-02 ENCOUNTER — Ambulatory Visit: Payer: Medicare Other | Admitting: Physician Assistant
